# Patient Record
Sex: Female | Born: 1935 | Race: Black or African American | Hispanic: No | Smoking: Never smoker
Health system: Southern US, Community
[De-identification: ages and names within clinical notes are randomized; demographics above are authoritative.]

## PROBLEM LIST (undated history)

## (undated) DIAGNOSIS — R001 Bradycardia, unspecified: Secondary | ICD-10-CM

## (undated) DIAGNOSIS — I739 Peripheral vascular disease, unspecified: Secondary | ICD-10-CM

## (undated) DIAGNOSIS — E7800 Pure hypercholesterolemia, unspecified: Secondary | ICD-10-CM

## (undated) DIAGNOSIS — M1990 Unspecified osteoarthritis, unspecified site: Secondary | ICD-10-CM

## (undated) DIAGNOSIS — E039 Hypothyroidism, unspecified: Secondary | ICD-10-CM

## (undated) DIAGNOSIS — I639 Cerebral infarction, unspecified: Secondary | ICD-10-CM

## (undated) DIAGNOSIS — B019 Varicella without complication: Secondary | ICD-10-CM

## (undated) DIAGNOSIS — H409 Unspecified glaucoma: Secondary | ICD-10-CM

## (undated) DIAGNOSIS — I480 Paroxysmal atrial fibrillation: Secondary | ICD-10-CM

## (undated) DIAGNOSIS — I10 Essential (primary) hypertension: Secondary | ICD-10-CM

## (undated) HISTORY — DX: Bradycardia, unspecified: R00.1

## (undated) HISTORY — DX: Varicella without complication: B01.9

## (undated) HISTORY — PX: MAZE: SHX5063

---

## 1999-04-23 ENCOUNTER — Encounter (INDEPENDENT_AMBULATORY_CARE_PROVIDER_SITE_OTHER): Payer: Self-pay | Admitting: Specialist

## 1999-04-23 ENCOUNTER — Other Ambulatory Visit: Admission: RE | Admit: 1999-04-23 | Discharge: 1999-04-23 | Payer: Self-pay | Admitting: Gastroenterology

## 2001-05-04 ENCOUNTER — Other Ambulatory Visit: Admission: RE | Admit: 2001-05-04 | Discharge: 2001-05-04 | Payer: Self-pay | Admitting: Family Medicine

## 2001-05-11 ENCOUNTER — Encounter: Payer: Self-pay | Admitting: Family Medicine

## 2001-05-11 ENCOUNTER — Encounter: Admission: RE | Admit: 2001-05-11 | Discharge: 2001-05-11 | Payer: Self-pay | Admitting: Internal Medicine

## 2002-07-13 ENCOUNTER — Encounter: Payer: Self-pay | Admitting: Family Medicine

## 2002-07-13 ENCOUNTER — Encounter: Admission: RE | Admit: 2002-07-13 | Discharge: 2002-07-13 | Payer: Self-pay | Admitting: Family Medicine

## 2002-07-26 ENCOUNTER — Emergency Department (HOSPITAL_COMMUNITY): Admission: EM | Admit: 2002-07-26 | Discharge: 2002-07-26 | Payer: Self-pay | Admitting: Emergency Medicine

## 2002-10-21 ENCOUNTER — Emergency Department (HOSPITAL_COMMUNITY): Admission: EM | Admit: 2002-10-21 | Discharge: 2002-10-21 | Payer: Self-pay | Admitting: Emergency Medicine

## 2003-09-11 ENCOUNTER — Inpatient Hospital Stay (HOSPITAL_COMMUNITY): Admission: EM | Admit: 2003-09-11 | Discharge: 2003-09-16 | Payer: Self-pay | Admitting: Emergency Medicine

## 2003-09-18 ENCOUNTER — Encounter: Admission: RE | Admit: 2003-09-18 | Discharge: 2003-09-18 | Payer: Self-pay | Admitting: Family Medicine

## 2003-12-09 ENCOUNTER — Emergency Department (HOSPITAL_COMMUNITY): Admission: EM | Admit: 2003-12-09 | Discharge: 2003-12-09 | Payer: Self-pay | Admitting: Emergency Medicine

## 2004-03-17 ENCOUNTER — Ambulatory Visit: Payer: Self-pay | Admitting: Hematology & Oncology

## 2004-06-30 ENCOUNTER — Ambulatory Visit: Payer: Self-pay | Admitting: Hematology & Oncology

## 2005-09-23 ENCOUNTER — Emergency Department (HOSPITAL_COMMUNITY): Admission: EM | Admit: 2005-09-23 | Discharge: 2005-09-23 | Payer: Self-pay | Admitting: *Deleted

## 2005-10-23 ENCOUNTER — Encounter: Admission: RE | Admit: 2005-10-23 | Discharge: 2005-10-23 | Payer: Self-pay | Admitting: Family Medicine

## 2006-10-26 ENCOUNTER — Encounter: Admission: RE | Admit: 2006-10-26 | Discharge: 2006-10-26 | Payer: Self-pay | Admitting: Family Medicine

## 2007-10-27 ENCOUNTER — Encounter: Admission: RE | Admit: 2007-10-27 | Discharge: 2007-10-27 | Payer: Self-pay | Admitting: Family Medicine

## 2008-12-14 ENCOUNTER — Encounter: Admission: RE | Admit: 2008-12-14 | Discharge: 2008-12-14 | Payer: Self-pay | Admitting: Family Medicine

## 2009-08-19 ENCOUNTER — Encounter: Admission: RE | Admit: 2009-08-19 | Discharge: 2009-08-19 | Payer: Self-pay | Admitting: Family Medicine

## 2010-01-01 ENCOUNTER — Encounter: Admission: RE | Admit: 2010-01-01 | Discharge: 2010-01-01 | Payer: Self-pay | Admitting: Family Medicine

## 2010-03-16 ENCOUNTER — Encounter: Payer: Self-pay | Admitting: Gastroenterology

## 2010-03-16 ENCOUNTER — Encounter: Payer: Self-pay | Admitting: Family Medicine

## 2010-07-11 NOTE — Discharge Summary (Signed)
NAME:  [name], [name]                          ACCOUNT NO.:  [id number]   MEDICAL RECORD NO.:  [id number]                   PATIENT TYPE:  INP   LOCATION:  [place]                                 FACILITY:  [facility]   PHYSICIAN:  Cletus Gash T. Pedro Earls, MD            DATE OF BIRTH:  [birth year]   DATE OF ADMISSION:  09/11/2003  DATE OF DISCHARGE:  09/16/2003                                 DISCHARGE SUMMARY   ADDENDUM:  Please send copy of previous dictation to Horald Pollen at  [facility].                                                Cletus Gash T. Pedro Earls, MD    WTP/MEDQ  D:  09/16/2003  T:  09/17/2003  Job:  [id number]   cc:   Santiago Glad L. Darron Doom, M.D.  [address] [place] [address]  Fax: [place]  (dictatior request copy of previous  discharge to be sent to her.

## 2010-07-11 NOTE — Consult Note (Signed)
NAME:  [name], [name] NO.:  [id number]   MEDICAL RECORD NO.:  [id number]                   PATIENT TYPE:  EMS   LOCATION:  [place]                                 FACILITY:  [facility]   PHYSICIAN:  Alyson Locket. Love, M.D.                 DATE OF BIRTH:  08/13/1925   DATE OF CONSULTATION:  09/11/2003  DATE OF DISCHARGE:                                   CONSULTATION   HISTORY OF PRESENT ILLNESS:  This 77 year old right-handed black married  female was seen in the emergency room for evaluation of new-onset left  facial weakness and dysarthria.   The patient has a known prior history of 10-years of hypertension and a  three-year history of paroxysmal atrial fibrillation characterized by  tachycardia.  She is unable to take aspirin because of allergies developing  cough.  She has no known history of diabetes mellitus, cigarette use,  alcoholism, drug use, or coronary artery disease.   MEDICATIONS:  1. Avapro 150 mg daily.  2. Metoprolol 50 mg in the morning and 25 mg at night.   She states that she developed palpitations and was admitted to a hospital,  [facility] in [place], [place], in March of 2005.  At that time,  the question of Zocor was raised, but no anticoagulation therapy.  She has  been followed by Pat Patrick. Rayford Halsted, M.D., a cardiologist in the [place].  She was noted to have palpitations on Sunday, September 09, 2003, and last evening developed right temporal headache.  She awoke  this morning with left-sided mouth drooling and continued to have some  headache and went to her family practice doctor in [facility], who gave  her a dose of Altace and referred her to the emergency room.  The patient  has had no chest pain or palpitations.   PHYSICAL EXAMINATION:  GENERAL:  Well-developed black female.  VITAL SIGNS:  Blood pressure right and left arms 180/60, heart 56 with no  bruits.  She was alert and oriented x3.  There was  no denial syndrome.  NEUROLOGY:  Cranial nerve examination revealed a left inferior quadrinopsia,  both discs were seen and flat.  The extraocular movements were full and  corneals were present.  The facial sensation revealed some mild decreased  left face. There was a left 7th and a dysarthria.  Her tongue deviates  slightly to the left.  Gags were present.  Motor examination revealed left  hand and arm clumsiness, but good strength of the left hand and arm,  decreased pinprick in the left face, arm, and leg.  Deep tendon reflexes 2+  and plantar responses downgoing.  NIA stroke scale was 5.   IMPRESSION:  1. Right brain stroke, posterior parietal occipital.  434.11  2. History of atrial fibrillation.  427.31  Currently in sinus rhythm on     telemetry.  3. Allergy to aspirin.  995.2  4. Hypertension.  796.2   PLAN:  Obtain a CT scan, obtain labs, and place the patient on Plavix versus  heparin depending on results of studies.                                              Alyson Locket. Erling Cruz, M.D.   JML/MEDQ  D:  09/11/2003  T:  09/11/2003  Job:  [id number]

## 2010-07-11 NOTE — Discharge Summary (Signed)
NAME:  [name], [name]                          ACCOUNT NO.:  [id number]   MEDICAL RECORD NO.:  [id number]                   PATIENT TYPE:  INP   LOCATION:  64                                 FACILITY:  [facility]   PHYSICIAN:  Jamal Collin. Hensel, M.D.             DATE OF BIRTH:  [birth year]   DATE OF ADMISSION:  09/11/2003  DATE OF DISCHARGE:  09/16/2003                                 DISCHARGE SUMMARY   PRIMARY CARE PHYSICIAN:  Santiago Glad L. Darron Doom, M.D., at [facility].   CONSULTING PHYSICIAN:  Alyson Locket. Love, M.D., with [facility].   DISCHARGE DIAGNOSES:  1. Right cerebrovascular accident, posterior parietal occipital.  2. History of paroxysmal atrial fibrillation.  3. Hypertension.  4. History of gastrointestinal bleed.   ADMISSION LABORATORY DATA:  Sodium 143, potassium 4.0, chloride 109, bicarb  27, BUN of 11, creatinine 1.0, glucose 105.  White count of 5.1, hemoglobin  13.1, hematocrit 39.9, platelet count of 218.  CK 59, CK-MB 0.9.  PTT of 28,  PT of 12.2, INR 0.9.  The patient is heme-negative.  Hemoglobin A1C of 6.1.  Initial troponin of 0.05.  Also got a lipid study, cholesterol 215,  triglycerides 52, HDL of 68, LDL of 137, VLDL of 10.  TSH of 3.770.  Homocysteine level of 11.74.   DISCHARGE LABORATORY DATA:  White count of 3.8, hemoglobin of 12.0,  hematocrit of 35.7, platelet count of 142.  PT of 18.9, INR of 1.9.  The  patient is therapeutic on heparin.   PROCEDURES:  The patient had a CT of the head on July 19 without contrast  that showed probable subacute left frontal infarct, mild diffuse cerebral  and cerebellar atrophy, no intracranial hemorrhage, mild chronic sphenoid  and right posterior ethmoid sinusitis.  The patient had MRI/MRA of the brain  on July 19 that showed acute infarcts involving the right middle cerebral  artery territory at multiple sites and also the right parieto-occipital  territory near the watershed.  A subacute  infarct on the left posterior  frontal lobe.  An MRA showed decreased arterial flow in some of the right  middle cerebral artery branches as well as probably the right distal PCA  branch.   HOSPITAL COURSE:  Please see the dictated H&P on the chart but, in short,  the patient is a 77 year old black female with a history of atrial  fibrillation with RVR, paroxysmal, not on anticoagulation, who presented  with a right-sided headache that developed on July 18, which she describes  as a poking, 8/10.  She awoke the morning of admission drooling  uncontrollably from the left side of the mouth.  Her speech was slurred.  She went to her primary care physician's office and was sent to the  emergency department, where based on the initial CT as dictated in the  procedures section, she was admitted with a CVA, diagnosis of subacute  left  frontal CVA with a facial droop secondary to that.  She was admitted for an  MRI and MRA of the brain to further evaluate it.  Neurology was consulted.   Problem 1.  CEREBROVASCULAR ACCIDENT OF THE BRAIN:  The patient was started  on heparin and was maintained therapeutic on the heparin until she became  therapeutic on Coumadin, with a goal INR of between 2 and 2.5.  The  patient's INR on the day of discharge was 1.9 and stable on day 3 of  Coumadin 5 mg p.o. daily.  The patient was discharged home with Coumadin 5  mg p.o. daily for treatment of anticoagulation for the stroke  prophylaxis/propagation.  She will need warfarin long-term. Also obtained a  fasting lipid panel as documented in the results section as well as a 2 D  echo bubble study to rule out PFO.  The TEE was later discontinued, as it  will not change the treatment plan.  The patient was treated, as I said,  earlier with heparin until she became therapeutic on Coumadin and was  discharged on a therapeutic dose of Coumadin.  Speech was also evaluated  given the patient's drooling and to evaluate for  dysphagia and possible  aspiration.  There was no evidence of dysphagia or aspiration.  The patient  was placed on a regular diet and was discharged home on a regular diet.   Problem 2.  ATRIAL FIBRILLATION:  The patient is on metoprolol here in the  hospital 50 mg p.o. daily and is in normal sinus rhythm with no evidence of  atrial fibrillation.  The patient has a history of paroxysmal atrial  fibrillation, for which she will take the metoprolol as previously directed.  She is also now on Coumadin secondary to the stroke.  This will also assist  with stroke prophylaxis given her history of paroxysmal atrial fibrillation.   Problem 3.  HYPERTENSION:  The patient is on metoprolol as well as Avapro.  Her blood pressures were stable during the hospital admission and on the day  of discharge, the patient's blood pressure was 158/70.  The patient will  likely need titration of her blood pressure medications as an outpatient.   Problem 4.  HYPERLIPIDEMIA:  The fasting lipid panel obtained on the morning  of admission was significant for an LDL of 137 and an HDL of 68.  The  patient was placed on Zocor 40 mg p.o. daily and was discharged home on  Zocor 40 mg p.o. daily given the fact that she will likely need an LDL of  less than 100.  She will need follow-up with her primary care physician to  monitor her LFTs as well as tolerating the new medicines.   Problem 5.  HEADACHES:  The patient had right-sided retro-orbital headaches  with pain radiating to her teeth and rhinorrhea on two to three mornings  during the admission.  She was diagnosed with cluster headaches and was  treated with Percocet p.r.n. for the headaches as well as 100% oxygen by  face mask.  She said the oxygen helped with the headaches and the Percocet  managed the pain as well.   DISCHARGE MEDICATIONS:  1. Avapro 150 mg one p.o. daily.  2. Metoprolol 50 mg one p.o. daily.  3. Zocor 40 mg one p.o. daily. 4. Coumadin 5 mg  one p.o. daily.  5. Percocet 06/3233 mg one tablet q.6h. for headache pain.   FOLLOW-UP INSTRUCTIONS:  1. She was  given the number for [facility], [phone], and     was told to call either Monday or Tuesday to schedule a lab draw to     monitor her PT and INR.  She was also told to arrange for a follow-up     appointment with Dr. Darron Doom in one to two weeks.  We would appreciate if     Dr. Darron Doom would monitor her PT and INR and titrate her dose of Coumadin     accordingly to maintain a therapeutic level between 2-3 given her history     of CVA.  2. We would appreciate Dr. Darron Doom titrating her Coumadin to a therapeutic     level.  We would also appreciate her monitoring her success on Zocor 40     mg p.o. daily for signs of liver toxicity as well as myositis.  We also     appreciate her titrating her blood pressure medications as Dr. Darron Doom     deems appropriate to manage her blood pressure more accordingly.  We also     appreciate if Dr. Darron Doom would monitor for success of Percocet     controlling     her headaches and also for any need for any other medications that she     may see fit.  As this is a Sunday, we were unable to call and schedule     the blood draws or the appointment with Dr. Darron Doom, but the patient was     given the phone number and she understood at the time of discharge that     she needed to schedule that appointment.      Cletus Gash T. Pedro Earls, MD                  Jamal Collin Andria Frames, M.D.    Dur[place] Nora  D:  09/16/2003  T:  09/17/2003  Job:  [id number]   cc:   Santiago Glad L. Darron Doom, M.D.  [address] [place] [address]  Fax: [place] Love, M.D.  [address] [place] [address]  Fax: [phone]

## 2010-11-25 ENCOUNTER — Other Ambulatory Visit: Payer: Self-pay | Admitting: Family Medicine

## 2010-11-25 DIAGNOSIS — Z1231 Encounter for screening mammogram for malignant neoplasm of breast: Secondary | ICD-10-CM

## 2011-01-06 ENCOUNTER — Ambulatory Visit
Admission: RE | Admit: 2011-01-06 | Discharge: 2011-01-06 | Disposition: A | Discharge status: Home / Self Care | Payer: Medicare Other | Source: Ambulatory Visit | Attending: Family Medicine | Admitting: Family Medicine

## 2011-01-06 DIAGNOSIS — Z1231 Encounter for screening mammogram for malignant neoplasm of breast: Secondary | ICD-10-CM

## 2011-03-03 DIAGNOSIS — R1032 Left lower quadrant pain: Secondary | ICD-10-CM | POA: Diagnosis not present

## 2011-03-03 DIAGNOSIS — N83209 Unspecified ovarian cyst, unspecified side: Secondary | ICD-10-CM | POA: Diagnosis not present

## 2011-03-24 DIAGNOSIS — H538 Other visual disturbances: Secondary | ICD-10-CM | POA: Diagnosis not present

## 2011-03-24 DIAGNOSIS — I10 Essential (primary) hypertension: Secondary | ICD-10-CM | POA: Diagnosis not present

## 2011-03-24 DIAGNOSIS — R51 Headache: Secondary | ICD-10-CM | POA: Diagnosis not present

## 2011-03-31 DIAGNOSIS — D3130 Benign neoplasm of unspecified choroid: Secondary | ICD-10-CM | POA: Diagnosis not present

## 2011-03-31 DIAGNOSIS — H40019 Open angle with borderline findings, low risk, unspecified eye: Secondary | ICD-10-CM | POA: Diagnosis not present

## 2011-04-01 ENCOUNTER — Other Ambulatory Visit (HOSPITAL_COMMUNITY): Payer: Medicare Other

## 2011-04-01 ENCOUNTER — Inpatient Hospital Stay (HOSPITAL_COMMUNITY)
Admission: EM | Admit: 2011-04-01 | Discharge: 2011-04-04 | DRG: 066 | Disposition: A | Discharge status: Home Health | Payer: Medicare Other | Source: Ambulatory Visit | Attending: Family Medicine | Admitting: Family Medicine

## 2011-04-01 ENCOUNTER — Encounter (HOSPITAL_COMMUNITY): Payer: Self-pay | Admitting: Emergency Medicine

## 2011-04-01 ENCOUNTER — Observation Stay (HOSPITAL_COMMUNITY): Admit: 2011-04-01 | Discharge: 2011-04-01 | Disposition: A | Discharge status: Home / Self Care | Payer: Medicare Other

## 2011-04-01 ENCOUNTER — Other Ambulatory Visit: Payer: Self-pay

## 2011-04-01 ENCOUNTER — Emergency Department (HOSPITAL_COMMUNITY): Payer: Medicare Other

## 2011-04-01 ENCOUNTER — Observation Stay (HOSPITAL_COMMUNITY): Payer: Medicare Other

## 2011-04-01 DIAGNOSIS — I10 Essential (primary) hypertension: Secondary | ICD-10-CM

## 2011-04-01 DIAGNOSIS — H539 Unspecified visual disturbance: Secondary | ICD-10-CM | POA: Diagnosis not present

## 2011-04-01 DIAGNOSIS — R51 Headache: Secondary | ICD-10-CM

## 2011-04-01 DIAGNOSIS — I498 Other specified cardiac arrhythmias: Secondary | ICD-10-CM | POA: Diagnosis present

## 2011-04-01 DIAGNOSIS — E039 Hypothyroidism, unspecified: Secondary | ICD-10-CM | POA: Diagnosis present

## 2011-04-01 DIAGNOSIS — G43909 Migraine, unspecified, not intractable, without status migrainosus: Secondary | ICD-10-CM | POA: Diagnosis present

## 2011-04-01 DIAGNOSIS — I495 Sick sinus syndrome: Secondary | ICD-10-CM | POA: Diagnosis not present

## 2011-04-01 DIAGNOSIS — I639 Cerebral infarction, unspecified: Secondary | ICD-10-CM

## 2011-04-01 DIAGNOSIS — R6889 Other general symptoms and signs: Secondary | ICD-10-CM | POA: Diagnosis not present

## 2011-04-01 DIAGNOSIS — R001 Bradycardia, unspecified: Secondary | ICD-10-CM

## 2011-04-01 DIAGNOSIS — I6350 Cerebral infarction due to unspecified occlusion or stenosis of unspecified cerebral artery: Principal | ICD-10-CM | POA: Diagnosis present

## 2011-04-01 DIAGNOSIS — G43009 Migraine without aura, not intractable, without status migrainosus: Secondary | ICD-10-CM | POA: Diagnosis not present

## 2011-04-01 DIAGNOSIS — I4891 Unspecified atrial fibrillation: Secondary | ICD-10-CM | POA: Diagnosis not present

## 2011-04-01 DIAGNOSIS — Z8673 Personal history of transient ischemic attack (TIA), and cerebral infarction without residual deficits: Secondary | ICD-10-CM | POA: Diagnosis present

## 2011-04-01 DIAGNOSIS — I480 Paroxysmal atrial fibrillation: Secondary | ICD-10-CM | POA: Insufficient documentation

## 2011-04-01 DIAGNOSIS — Z79899 Other long term (current) drug therapy: Secondary | ICD-10-CM

## 2011-04-01 DIAGNOSIS — H269 Unspecified cataract: Secondary | ICD-10-CM | POA: Diagnosis present

## 2011-04-01 DIAGNOSIS — H409 Unspecified glaucoma: Secondary | ICD-10-CM | POA: Diagnosis present

## 2011-04-01 HISTORY — DX: Paroxysmal atrial fibrillation: I48.0

## 2011-04-01 HISTORY — DX: Cerebral infarction, unspecified: I63.9

## 2011-04-01 HISTORY — DX: Essential (primary) hypertension: I10

## 2011-04-01 HISTORY — DX: Hypothyroidism, unspecified: E03.9

## 2011-04-01 LAB — DIFFERENTIAL
Basophils Relative: 0 % (ref 0–1)
Eosinophils Relative: 1 % (ref 0–5)
Lymphs Abs: 1.5 10*3/uL (ref 0.7–4.0)
Monocytes Absolute: 0.4 10*3/uL (ref 0.1–1.0)
Neutro Abs: 1.9 10*3/uL (ref 1.7–7.7)
Neutrophils Relative %: 50 % (ref 43–77)

## 2011-04-01 LAB — POCT I-STAT, CHEM 8
BUN: 18 mg/dL (ref 6–23)
Hemoglobin: 13.3 g/dL (ref 12.0–15.0)
Potassium: 4.5 mEq/L (ref 3.5–5.1)
Sodium: 143 mEq/L (ref 135–145)
TCO2: 27 mmol/L (ref 0–100)

## 2011-04-01 LAB — CBC
Hemoglobin: 12.9 g/dL (ref 12.0–15.0)
MCHC: 33.1 g/dL (ref 30.0–36.0)
MCV: 81.4 fL (ref 78.0–100.0)
Platelets: 172 10*3/uL (ref 150–400)

## 2011-04-01 LAB — POCT I-STAT TROPONIN I: Troponin i, poc: 0.01 ng/mL (ref 0.00–0.08)

## 2011-04-01 MED ORDER — ACETAMINOPHEN 650 MG RE SUPP: 650.0000 mg | Freq: Four times a day (QID) | RECTAL | Status: DC | PRN

## 2011-04-01 MED ORDER — SODIUM CHLORIDE 0.9 % IV SOLN
INTRAVENOUS | Status: DC
  Administered 2011-04-01 (×2): via INTRAVENOUS

## 2011-04-01 MED ORDER — HYDRALAZINE HCL 20 MG/ML IJ SOLN
10.0000 mg | Freq: Once | INTRAMUSCULAR | Status: AC
  Administered 2011-04-01: 10 mg via INTRAVENOUS
  Filled 2011-04-01: qty 0.5

## 2011-04-01 MED ORDER — HYDROCHLOROTHIAZIDE 25 MG PO TABS
25.0000 mg | ORAL_TABLET | Freq: Every day | ORAL | Status: DC
  Administered 2011-04-01: 25 mg via ORAL
  Filled 2011-04-01 (×4): qty 1

## 2011-04-01 MED ORDER — SODIUM CHLORIDE 0.9 % IJ SOLN: 3.0000 mL | INTRAMUSCULAR | Status: DC | PRN

## 2011-04-01 MED ORDER — ONDANSETRON HCL 4 MG/2ML IJ SOLN: 4.0000 mg | Freq: Four times a day (QID) | INTRAMUSCULAR | Status: DC | PRN

## 2011-04-01 MED ORDER — HYDROCODONE-ACETAMINOPHEN 5-325 MG PO TABS: 1.0000 | ORAL_TABLET | ORAL | Status: DC | PRN

## 2011-04-01 MED ORDER — FENTANYL CITRATE 0.05 MG/ML IJ SOLN
50.0000 ug | Freq: Once | INTRAMUSCULAR | Status: AC
  Administered 2011-04-01: 50 ug via INTRAVENOUS
  Filled 2011-04-01: qty 2

## 2011-04-01 MED ORDER — ONDANSETRON HCL 4 MG/2ML IJ SOLN
4.0000 mg | Freq: Once | INTRAMUSCULAR | Status: AC
  Administered 2011-04-01: 4 mg via INTRAVENOUS
  Filled 2011-04-01: qty 2

## 2011-04-01 MED ORDER — SODIUM CHLORIDE 0.9 % IJ SOLN: 3.0000 mL | Freq: Two times a day (BID) | INTRAMUSCULAR | Status: DC

## 2011-04-01 MED ORDER — BISACODYL 5 MG PO TBEC
5.0000 mg | DELAYED_RELEASE_TABLET | Freq: Every day | ORAL | Status: DC | PRN
  Filled 2011-04-01: qty 1

## 2011-04-01 MED ORDER — SODIUM CHLORIDE 0.9 % IV SOLN: INTRAVENOUS | Status: DC

## 2011-04-01 MED ORDER — ALUM & MAG HYDROXIDE-SIMETH 200-200-20 MG/5ML PO SUSP: 30.0000 mL | Freq: Four times a day (QID) | ORAL | Status: DC | PRN

## 2011-04-01 MED ORDER — ACETAMINOPHEN 325 MG PO TABS
650.0000 mg | ORAL_TABLET | Freq: Four times a day (QID) | ORAL | Status: DC | PRN
  Administered 2011-04-01: 650 mg via ORAL
  Filled 2011-04-01: qty 2

## 2011-04-01 MED ORDER — SODIUM CHLORIDE 0.9 % IV SOLN: 250.0000 mL | INTRAVENOUS | Status: DC | PRN

## 2011-04-01 MED ORDER — THYROID 30 MG PO TABS
15.0000 mg | ORAL_TABLET | Freq: Every day | ORAL | Status: DC
  Administered 2011-04-01 – 2011-04-04 (×4): 15 mg via ORAL
  Filled 2011-04-01 (×5): qty 1

## 2011-04-01 MED ORDER — SODIUM CHLORIDE 0.9 % IJ SOLN
3.0000 mL | Freq: Two times a day (BID) | INTRAMUSCULAR | Status: DC
  Administered 2011-04-01 – 2011-04-04 (×6): 3 mL via INTRAVENOUS

## 2011-04-01 MED ORDER — SENNOSIDES-DOCUSATE SODIUM 8.6-50 MG PO TABS
1.0000 | ORAL_TABLET | Freq: Every evening | ORAL | Status: DC | PRN
  Filled 2011-04-01: qty 1

## 2011-04-01 MED ORDER — ONDANSETRON HCL 4 MG PO TABS: 4.0000 mg | ORAL_TABLET | Freq: Four times a day (QID) | ORAL | Status: DC | PRN

## 2011-04-01 MED ORDER — OLMESARTAN MEDOXOMIL 40 MG PO TABS
40.0000 mg | ORAL_TABLET | Freq: Every day | ORAL | Status: DC
  Administered 2011-04-01 – 2011-04-04 (×4): 40 mg via ORAL
  Filled 2011-04-01 (×6): qty 1

## 2011-04-01 NOTE — ED Notes (Signed)
EKG obtained and showed to Dr. Alvino Chapel.

## 2011-04-01 NOTE — ED Notes (Signed)
MD at bedside. 

## 2011-04-01 NOTE — H&P (Signed)
History and Physical  CADANCE SCHUMAN [id number] DOB: [birth year] DOA: 04/01/2011  Referring physician: PCP: Odette Fraction, MD, MD   Chief Complaint: High blood pressure  HPI:  78 year old woman presents to the emergency department with complaint of headache for one and a half weeks, bradycardia, hypertension. Recently started on bysystolic by her primary care physician for hypertension. Since that time she has noticed a bradycardia at home. Blood pressure has remained high at home. Last night her blood pressure was over [id number] systolic and so she came to the emergency department for further evaluation.  She has a history of migraines and has had several migraine headaches lately. When she was last seen in the office by her primary care physician she had some testing done to exclude temporal arteritis as she had had some temporal pain. No visual changes suggestive of temporal arteritis. By report her blood work was unremarkable. She was seen yesterday by her eye doctor who told her that she had bilateral cataracts and possible glaucoma.  In the emergency department she was noted to have narrow complex bradycardia without evidence of high grade AV block. Cardiology consultation was requested and the patient was referred for medical admission. Of note CT of the head done for headache could not rule out acute stroke. However the patient denies any focal neurologic deficits. She does report a history of 3 strokes in the past.  Review of Systems:  Negative for fever, sore throat, rash, new muscle aches, focal neurologic deficits, "curtain over vision", chest pain, shortness of breath, abdominal pain, nausea, vomiting, diarrhea, dysuria, bleeding.   Past Medical History  Diagnosis Date  . Hypertension   . Stroke   . Palpitations   . PAF (paroxysmal atrial fibrillation)   . Hypothyroidism   . Migraine    Past Surgical History  Procedure Date  . A flutter ablation     2006   Social History:   reports that she has never smoked. She does not have any smokeless tobacco history on file. She reports that she does not drink alcohol or use illicit drugs.  Allergies  Allergen Reactions  . Aspirin Cough    Family History  Problem Relation Age of Onset  . Hypertension Mother     Prior to Admission medications   Medication Sig Start Date End Date Taking? Authorizing Provider  irbesartan (AVAPRO) 150 MG tablet Take 150 mg by mouth daily.   Yes Historical Provider, MD  nebivolol (BYSTOLIC) 10 MG tablet Take 10 mg by mouth daily.   Yes Historical Provider, MD  thyroid (ARMOUR) 15 MG tablet Take 15 mg by mouth daily.   Yes Historical Provider, MD   Physical Exam: Filed Vitals:   04/01/11 0630 04/01/11 0645 04/01/11 0700 04/01/11 0758  BP: 151/60 135/48 143/51 156/54  Pulse: 56 56 55 56  Temp:    98.2 F (36.8 C)  TempSrc:    Oral  Resp: 19 17 15 17   SpO2: 93% 93% 96% 97%     General:  Appears calm and comfortable. Appears younger than stated age. No temporal pain bilaterally to palpation.  Eyes:  Pupils equal, round, reactive to light. Normal lids, irises, conjunctiva.  ENT:  Grossly normal hearing. Normal lips and tongue.  Neck:  No lymphadenopathy or masses. No thyromegaly.  Cardiovascular:  Regular rate and rhythm. No murmur, rub, gallop. No lower extremity edema.  Telemetry: During examination heart rate in the 60s. Review of telemetry is notable for marked sinus bradycardia. No evidence of high-grade  AV block.  Respiratory:  Clear to auscultation bilaterally. No wheezes, rales, rhonchi. Normal respiratory effort.  Abdomen:  Soft, nontender, nondistended.  Skin:  Grossly unremarkable.  Musculoskeletal:  Tone and strength upper and lower extremities appears grossly normal. No focal deficits noted.  Psychiatric:  Grossly normal mood and affect. Speech fluent and appropriate.  Neurologic:  Cranial nerves 2-12 intact.  Labs on Admission:  Basic Metabolic  Panel:  Lab [id number]  NA 143  K 4.5  CL 108  CO2 --  GLUCOSE 105*  BUN 18  CREATININE 1.00  CALCIUM --  MG --  PHOS --   CBC:  Lab 04/01/11 0439 04/01/11 0429  WBC -- 3.9*  NEUTROABS -- 1.9  HGB 13.3 12.9  HCT 39.0 39.0  MCV -- 81.4  PLT -- 172   Radiological Exams on Admission: Ct Head Wo Contrast  04/01/2011  *RADIOLOGY REPORT*  Clinical Data: Right-sided headache and visual disturbances  CT HEAD WITHOUT CONTRAST  Technique:  Contiguous axial images were obtained from the base of the skull through the vertex without contrast.  Comparison: 09/11/2003 MRI  Findings: Remote infarct involving the inferior left frontal lobe. Within the posterior right parietal lobe, there is an area of hypoattenuation without mass effect, also favored to be remote infarction.  Age indeterminate right thalamic lacunar infarction. Prominence of the sulci, cisterns, and ventricles, in keeping with volume loss. There are subcortical and periventricular white matter hypodensities, a nonspecific finding most often seen with chronic microangiopathic changes.  There is no evidence for acute hemorrhage, overt hydrocephalus, mass lesion, or abnormal extra-axial fluid collection.  No definite CT evidence for acute cortical based (large artery) infarction. The visualized paranasal sinuses and mastoid air cells are predominately clear.  IMPRESSION: Hypoattenuating areas within the inferior left frontal lobe and posterior right parietal lobe are favored to reflect areas of remote infarction.  White matter hypodensities are a nonspecific finding most in keeping with chronic microangiopathic change.  Age indeterminate right thalamic lacunar infarction. If clinical concern for acute ischemia persists, MRI recommended.  Original Report Authenticated By: Suanne Marker, M.D.    EKG: Independently reviewed.  Sinus bradycardia with rate of 40. Incomplete right bundle branch block. Nonspecific ST changes. No acute  changes seen.  Assessment/Plan 1. Marked sinus bradycardia: Presumably secondary to recent start a beta blocker therapy. Continue beta blocker. Appreciate pending cardiology consultation. 2. Migraine headaches: She is a history of migraine headaches which are associated with visual disturbances typically. She did have a headache last night and has had headaches over the last 1 weeks. No further evaluation indicated at this time. 3. Abnormal CT of the head: Performed for headaches. Age indeterminate right thalamic lacunar infarction. Patient reports history of 3 strokes. Besides her headache and visual changes associated with migraines (as well as cataracts and possible glaucoma by report) no focal neurologic symptoms. No neurologic deficits on examination. I doubt acute stroke or acute ischemia. Patient is allergic to aspirin. Will check an MRI of the brain to rule out acute stroke but as this is doubted at this point will not proceed with further evaluation unless positive. 4. Hypertension: Continue Avapro. Start diuretic. 5. History of paroxysmal atrial fibrillation: Status post ablation. 6. Hypothyroidism: Continue replacement therapy.  Code Status:  Full code Family Communication:  Discussed with husband at bedside Disposition Plan:  Home when improved.  Murray Hodgkins, MD  [facility] Pager [phone] 04/01/2011, 10:17 AM

## 2011-04-01 NOTE — ED Notes (Signed)
Pt states she has had headache for about one and a half weeks. Pt denies nausea. Pt states she has had visual disturbances for the past week and a half also.

## 2011-04-01 NOTE — ED Provider Notes (Signed)
History     CSN: [id number]  Arrival date & time 04/01/11  0231   First MD Initiated Contact with Patient 04/01/11 0403      Chief Complaint  Patient presents with  . Headache  . Hypertension    (Consider location/radiation/quality/duration/timing/severity/associated sxs/prior treatment) Patient is a 78 y.o. female presenting with headaches and hypertension. The history is provided by the patient.  Headache  Pertinent negatives include no shortness of breath, no nausea and no vomiting.  Hypertension Associated symptoms include headaches. Pertinent negatives include no chest pain, no abdominal pain and no shortness of breath.   patient has had headaches recently. She's also high blood pressure for the last week or 2. She was started on Bystolic by her primary care Dr. She states she had tests look for temporal arteritis for right-sided headache. She states it was negative. No numbness or weakness. No chest pain. She states she saw an ophthalmologist. No lightheadedness or dizziness. She's had some visual changes. No near syncope or dizziness. Her blood pressure was elevated when he was checked at home today. Her heart rate normally runs in the 50s. He is in the 30s and 40s here.   Past Medical History  Diagnosis Date  . Hypertension   . Stroke   . Palpitations     History reviewed. No pertinent past surgical history.  No family history on file.  History  Substance Use Topics  . Smoking status: Not on file  . Smokeless tobacco: Not on file  . Alcohol Use:     OB History    Grav Para Term Preterm Abortions TAB SAB Ect Mult Living                  Review of Systems  Constitutional: Negative for activity change and appetite change.  HENT: Negative for neck stiffness.   Eyes: Positive for visual disturbance. Negative for pain.  Respiratory: Negative for chest tightness and shortness of breath.   Cardiovascular: Negative for chest pain and leg swelling.  Gastrointestinal:  Negative for nausea, vomiting, abdominal pain and diarrhea.  Genitourinary: Negative for flank pain.  Musculoskeletal: Negative for back pain.  Skin: Negative for rash.  Neurological: Positive for headaches. Negative for weakness, light-headedness and numbness.  Psychiatric/Behavioral: Negative for behavioral problems.    Allergies  Aspirin  Home Medications   Current Outpatient Rx  Name Route Sig Dispense Refill  . IRBESARTAN 150 MG PO TABS Oral Take 150 mg by mouth daily.    . NEBIVOLOL HCL 10 MG PO TABS Oral Take 10 mg by mouth daily.    . THYROID 15 MG PO TABS Oral Take 15 mg by mouth daily.      BP 163/53  Pulse 61  Temp(Src) 98 F (36.7 C) (Oral)  Resp 16  SpO2 100%  Physical Exam  Nursing note and vitals reviewed. Constitutional: She is oriented to person, place, and time. She appears well-developed and well-nourished.  HENT:  Head: Normocephalic and atraumatic.  Eyes: EOM are normal. Pupils are equal, round, and reactive to light.  Neck: Normal range of motion. Neck supple.  Cardiovascular: Normal rate, regular rhythm and normal heart sounds.   No murmur heard. Pulmonary/Chest: Effort normal and breath sounds normal. No respiratory distress. She has no wheezes. She has no rales.  Abdominal: Soft. Bowel sounds are normal. She exhibits no distension. There is no tenderness. There is no rebound and no guarding.  Musculoskeletal: Normal range of motion.  Neurological: She is alert and oriented  to person, place, and time. No cranial nerve deficit.  Skin: Skin is warm and dry.  Psychiatric: She has a normal mood and affect. Her speech is normal.    ED Course  Procedures (including critical care time)  Labs Reviewed  CBC - Abnormal; Notable for the following:    WBC 3.9 (*)    All other components within normal limits  POCT I-STAT, CHEM 8 - Abnormal; Notable for the following:    Glucose, Bld 105 (*)    All other components within normal limits  DIFFERENTIAL    POCT I-STAT TROPONIN I   Ct Head Wo Contrast  04/01/2011  *RADIOLOGY REPORT*  Clinical Data: Right-sided headache and visual disturbances  CT HEAD WITHOUT CONTRAST  Technique:  Contiguous axial images were obtained from the base of the skull through the vertex without contrast.  Comparison: 09/11/2003 MRI  Findings: Remote infarct involving the inferior left frontal lobe. Within the posterior right parietal lobe, there is an area of hypoattenuation without mass effect, also favored to be remote infarction.  Age indeterminate right thalamic lacunar infarction. Prominence of the sulci, cisterns, and ventricles, in keeping with volume loss. There are subcortical and periventricular white matter hypodensities, a nonspecific finding most often seen with chronic microangiopathic changes.  There is no evidence for acute hemorrhage, overt hydrocephalus, mass lesion, or abnormal extra-axial fluid collection.  No definite CT evidence for acute cortical based (large artery) infarction. The visualized paranasal sinuses and mastoid air cells are predominately clear.  IMPRESSION: Hypoattenuating areas within the inferior left frontal lobe and posterior right parietal lobe are favored to reflect areas of remote infarction.  White matter hypodensities are a nonspecific finding most in keeping with chronic microangiopathic change.  Age indeterminate right thalamic lacunar infarction. If clinical concern for acute ischemia persists, MRI recommended.  Original Report Authenticated By: Suanne Marker, M.D.     No diagnosis found.   Date: 04/01/2011  Rate: 40  Rhythm: sinus bradycardia  QRS Axis: normal  Intervals: normal  ST/T Wave abnormalities: normal  Conduction Disutrbances:incomplete RBBB  Narrative Interpretation: more bradycardic  Old EKG Reviewed: changes noted    MDM  The patient presented with headache. She's also had hypertension home. She was found to be bradycardic here. She appears to be in a  sinus bradycardia. She will have episodes where PR with interval decreases and she appears to higher junctional escape rhythm. This is not necessarily when she is at the severe bradycardia. Initial hypertension was treated with hydralazine. The headache and change from right-sided to more diffuse after that. CT showed likely chronic changes. She'll be admitted to medicine. I discussed with cardiology, who will consult the patient.       Jasper Riling. Alvino Chapel, MD 04/01/11 [phone]

## 2011-04-01 NOTE — ED Notes (Signed)
Pt c/o worsening headache. MD aware. Further med orders received.

## 2011-04-01 NOTE — Consult Note (Signed)
CARDIOLOGY CONSULT NOTE   Patient ID: [name] MRN: [id number] DOB/AGE: 1925/05/30 78 y.o.  Admit date: 04/01/2011  Primary Physician   Dr Dennard Schaumann at [facility] Primary Cardiologist   Was Ola Spurr at [facility] Reason for Consultation   bradycardia  [id number] C Mauthe is a 78 y.o. female with no history of CAD.  She saw Dr. Dennard Schaumann, her family physician about 10 days ago and was started on Bystolic for better blood pressure control. She has been compliant with this medication. She checks her blood pressure regularly and noticed that her blood pressure was elevated and her heart rate was as low as 40. She communicated this with her doctor's office and was told that she should discuss this with Dr. Dennard Schaumann when she sees him on 04/03/2011. The patient was planning to do this but developed worsening headache and her blood pressure climbed with the headache. Last p.m., she noticed a systolic blood pressure of 200 and she had a severe headache with this. She was concerned about having another stroke and so came to the hospital.  in the emergency room she was noted to have heart rate sustained in the 40s and occasionally dropping into the 30s. Cardiology was asked to evaluate her.  Ms. Handlin has not had any chest pain. She has no awareness of a slow heart rate. She knows that her heart rate was slow because she saw her blood pressure machine but was otherwise unaware. Specifically, she has had no presyncope, dizziness and has been in no danger of falling. Other than the headache which is a temporal headache on the right, she has no new issues or complaints.   Past Medical History  Diagnosis Date  . Hypertension   . Stroke   . Palpitations   . PAF (paroxysmal atrial fibrillation)    Surgical history -  A fib Ablation 2006  Allergies  Allergen Reactions   Aceon  cough   . Aspirin Cough    I have reviewed the patient's current medications. Prior to Admission:  1. irbesartan (AVAPRO)  150 MG tablet, Take 150 mg by mouth daily., Disp: , Rfl: ; 2.  nebivolol (BYSTOLIC) 10 MG tablet, Take 10 mg by mouth daily.10 days 3. thyroid (ARMOUR) 15 MG tablet, Take 15 mg by mouth daily., Disp: , Rfl:  Scheduled:   . sodium chloride   Intravenous STAT  . fentaNYL  50 mcg Intravenous Once  . hydrALAZINE  10 mg Intravenous Once  . ondansetron  4 mg Intravenous Once    History   Social History  . Marital Status: Married    Spouse Name: N/A    Number of Children: N/A  . Years of Education: N/A   Occupational History  .  retired now, but worked in the [employer]  . Smoking status: Never  . Smokeless tobacco: Never  . Alcohol Use: no  . Drug Use:   . Sexually Active:    Social History Narrative  . Lives with husband of 68 years.     Family history on file. Mother died 56, HTN, Father died at 49, GIB, neither one nor siblings with cardiac issues  ROS:  she has rare palpitations since the ablation. She will occasionally feel her heart speed up but it is brief, self-limiting and does not cause any associated symptoms. She has been having frequent headaches. She saw her eye doctor as recommended and the eye doctor told her that she had cataracts but  said that her blood pressure would need to be improved before she could have cataract surgery. When her blood pressure is high and the headache is severe, she has visual disturbances and as well. She feels that she doesn't see all of a sentence on a page or when she looks at something she can see only half of this. It is unclear if she is having visual field loss or problems interpreting the images. She has occasional musculoskeletal aches and pains. She has had no recent illnesses, fevers or chills. She has rare reflux symptoms and never gets melena. She feels that her secretions including saliva and nasal drainage significantly improved increased after her stroke in 2005. She still has problems with drooling at  times but no problems swallowing. Full 14 point review of systems complete and found to be negative unless listed  above  Physical Exam: Blood pressure 156/54, pulse 56, temperature 98.2 F (36.8 C), temperature source Oral, resp. rate 17, SpO2 97.00%.   General: Well developed, well nourished, elderly female in no acute distress Head: Eyes PERRLA, No xanthomas.   Normocephalic and atraumatic, oropharynx without edema or exudate. Dentition pretty good. Lungs: Clear bilaterally to auscultation  Heart: HRRR S1 S2, no rub/gallop, no murmur. pulses are 2+ & equal all 4 extrem.   Neck: No carotid bruit. No lymphadenopathy.  JVD not elevated. Abdomen: Bowel sounds present, abdomen soft and non-tender without masses or hernias noted. Msk:  No spine or cva tenderness. She is a little weak but there does not seem to be any unilateral weakness, no joint deformities or effusions. Extremities: No clubbing or cyanosis.  No edema.  Neuro: Alert and oriented X 3. No focal deficits noted. Psych:  Good affect, responds appropriately Skin: No rashes or lesions noted.  Labs:   Lab Results  Component Value Date   WBC 3.9* 04/01/2011   HGB 13.3 04/01/2011   HCT 39.0 04/01/2011   MCV 81.4 04/01/2011   PLT 172 04/01/2011   No results found for this basename: INR in the last 72 hours  Lab 04/01/11 0439  NA 143  K 4.5  CL 108  CO2 --  BUN 18  CREATININE 1.00  CALCIUM --  PROT --  BILITOT --  ALKPHOS --  ALT --  AST --  GLUCOSE 105*   Radiology:  Ct Head Wo Contrast 04/01/2011  *RADIOLOGY REPORT*  Clinical Data: Right-sided headache and visual disturbances  CT HEAD WITHOUT CONTRAST  Technique:  Contiguous axial images were obtained from the base of the skull through the vertex without contrast.  Comparison: 09/11/2003 MRI  Findings: Remote infarct involving the inferior left frontal lobe. Within the posterior right parietal lobe, there is an area of hypoattenuation without mass effect, also favored to be  remote infarction.  Age indeterminate right thalamic lacunar infarction. Prominence of the sulci, cisterns, and ventricles, in keeping with volume loss. There are subcortical and periventricular white matter hypodensities, a nonspecific finding most often seen with chronic microangiopathic changes.  There is no evidence for acute hemorrhage, overt hydrocephalus, mass lesion, or abnormal extra-axial fluid collection.  No definite CT evidence for acute cortical based (large artery) infarction. The visualized paranasal sinuses and mastoid air cells are predominately clear.  IMPRESSION: Hypoattenuating areas within the inferior left frontal lobe and posterior right parietal lobe are favored to reflect areas of remote infarction.  White matter hypodensities are a nonspecific finding most in keeping with chronic microangiopathic change.  Age indeterminate right thalamic lacunar infarction. If clinical  concern for acute ischemia persists, MRI recommended.  Original Report Authenticated By: Suanne Marker, M.D.    EKG: SINUS BRADYCARDIA ~ V-rate< 50 INCOMPLETE RIGHT BUNDLE BRANCH BLOCK ~ QRSd >105, terminal axis(90,270) BORDERLINE ST DEPRESSION, LATERAL LEADS ~ ST <-0.66mV, I aVL V5 V6   ASSESSMENT AND PLAN:   The patient was seen today by Dr Angelena Form, the patient evaluated and the data reviewed.  1. Bradycardia: On telemetry, she is in sinus rhythm/sinus bradycardia with HR of 55-70, occasionally dropping into the 30s. Question of junctional rhythm when her heart rate dips into the 30s. We will discontinue the bystolic and follow her heart rhythm. She should be watched on telemetry for 24 hours. She does not have any symptoms that can be attributed to the bradycardia. She will need improved blood pressure control.   2. Hypertension: Per primary M.D.  See my addendum below.  cdm  Signed: Rosaria Ferries 04/01/2011, 9:03 AM   I have personally seen and examined this patient with Rosaria Ferries, PA-C. I  agree with the assessment and plan as outlined above. She has sinus bradycardia with periods of possible junctional rhythm. Her only medication change has been the addition of the beta blocker. Agree with stopping the Bystolic. Monitor on telemetry for 24 hours. I do not think she will need a pacemaker. I would not restart any AV nodal blocking agents. She does need better BP control. Would recommend increasing her Avapro to 300 mg Qdaily. She could also be started on Norvasc. I will see in the am.   Rihan Schueler 11:08 AM 04/01/2011

## 2011-04-01 NOTE — ED Notes (Signed)
Patient returned from CT

## 2011-04-01 NOTE — ED Notes (Signed)
[id number] Expected date:<BR> Expected time:<BR> Means of arrival:<BR> Comments:<BR> EMS/Hypertension/headache

## 2011-04-01 NOTE — ED Notes (Signed)
Headache since 1900 last night. Denies n/v. Pt has hx of hypertension. No stroke symptoms per EMS

## 2011-04-02 ENCOUNTER — Observation Stay (HOSPITAL_COMMUNITY): Payer: Medicare Other

## 2011-04-02 ENCOUNTER — Other Ambulatory Visit (HOSPITAL_COMMUNITY): Payer: Medicare Other

## 2011-04-02 DIAGNOSIS — I495 Sick sinus syndrome: Secondary | ICD-10-CM | POA: Diagnosis not present

## 2011-04-02 DIAGNOSIS — Z8673 Personal history of transient ischemic attack (TIA), and cerebral infarction without residual deficits: Secondary | ICD-10-CM | POA: Diagnosis not present

## 2011-04-02 DIAGNOSIS — G43909 Migraine, unspecified, not intractable, without status migrainosus: Secondary | ICD-10-CM | POA: Diagnosis present

## 2011-04-02 DIAGNOSIS — E039 Hypothyroidism, unspecified: Secondary | ICD-10-CM | POA: Diagnosis present

## 2011-04-02 DIAGNOSIS — R4701 Aphasia: Secondary | ICD-10-CM | POA: Diagnosis not present

## 2011-04-02 DIAGNOSIS — I6350 Cerebral infarction due to unspecified occlusion or stenosis of unspecified cerebral artery: Secondary | ICD-10-CM | POA: Diagnosis not present

## 2011-04-02 DIAGNOSIS — H269 Unspecified cataract: Secondary | ICD-10-CM | POA: Diagnosis present

## 2011-04-02 DIAGNOSIS — I10 Essential (primary) hypertension: Secondary | ICD-10-CM | POA: Diagnosis not present

## 2011-04-02 DIAGNOSIS — I6340 Cerebral infarction due to embolism of unspecified cerebral artery: Secondary | ICD-10-CM | POA: Diagnosis not present

## 2011-04-02 DIAGNOSIS — R51 Headache: Secondary | ICD-10-CM | POA: Diagnosis not present

## 2011-04-02 DIAGNOSIS — H409 Unspecified glaucoma: Secondary | ICD-10-CM | POA: Diagnosis present

## 2011-04-02 DIAGNOSIS — I6330 Cerebral infarction due to thrombosis of unspecified cerebral artery: Secondary | ICD-10-CM | POA: Diagnosis not present

## 2011-04-02 DIAGNOSIS — I498 Other specified cardiac arrhythmias: Secondary | ICD-10-CM | POA: Diagnosis present

## 2011-04-02 DIAGNOSIS — I4891 Unspecified atrial fibrillation: Secondary | ICD-10-CM | POA: Diagnosis not present

## 2011-04-02 DIAGNOSIS — I6789 Other cerebrovascular disease: Secondary | ICD-10-CM | POA: Diagnosis not present

## 2011-04-02 DIAGNOSIS — Z79899 Other long term (current) drug therapy: Secondary | ICD-10-CM | POA: Diagnosis not present

## 2011-04-02 LAB — TSH: TSH: 6.682 u[IU]/mL — ABNORMAL HIGH (ref 0.350–4.500)

## 2011-04-02 MED ORDER — ENOXAPARIN SODIUM 40 MG/0.4ML ~~LOC~~ SOLN
40.0000 mg | SUBCUTANEOUS | Status: DC
  Administered 2011-04-02 – 2011-04-04 (×3): 40 mg via SUBCUTANEOUS
  Filled 2011-04-02 (×4): qty 0.4

## 2011-04-02 NOTE — Progress Notes (Addendum)
PROGRESS NOTE  [name] [id number] DOB: 04/16/1925 DOA: 04/01/2011 PCP: Odette Fraction, MD, MD  Brief narrative: 78 year old woman presented to the emergency department with headache for 1-1/2 weeks, bradycardia and hypertension. Recently started on bysystolic by her primary care physician for hypertension. Was noted to be bradycardic in the emergency department. CT of head was abnormal.  Past medical history: Hypertension, stroke x3, paroxysmal atrial fibrillation, status post atrial ablation, hypothyroidism, migraine headaches  Consultants:  Cardiology  Neurology  Procedures:  2-D echocardiogram:  Bilateral carotid ultrasound:  Antibiotics:  None  Interim History: Chart reviewed. Remains bradycardic. No dysrhythmias. Cleared by cardiology for discharge home. MRI performed February 6 (confirmed with MRI technician) but was temporarily lost in the system.  Subjective: Feels better. Headache resolved. Some nausea but otherwise feels okay. No focal deficits.  Objective: Filed Vitals:   04/01/11 1622 04/01/11 2049 04/02/11 0240 04/02/11 0525  BP:  125/54 127/56 153/53  Pulse:  48 51 47  Temp:  98.2 F (36.8 C) 98.7 F (37.1 C) 98.3 F (36.8 C)  TempSrc:  Oral Oral Oral  Resp:  16 16 16   Height: 5\' 3"  (1.6 m)     Weight: 63.186 kg (139 lb 4.8 oz)     SpO2:  97% 96% 97%    Intake/Output Summary (Last 24 hours) at 04/02/11 0905 Last data filed at 04/02/11 0824  Gross per 24 hour  Intake   1040 ml  Output      0 ml  Net   1040 ml    Exam:  General: Appears calm and comfortable.  Eyes: Pupils equal, round, reactive to light. Normal lids, irises, conjunctiva.  ENT: Grossly normal hearing. Normal lips and tongue.  Cardiovascular: Bradycardic, regular rhythm. No murmur, rub, gallop. No lower extremity edema.  Telemetry: Sinus bradycardia. No dysrhythmias.  Respiratory: Clear to auscultation bilaterally. No wheezes, rales, rhonchi. Normal respiratory  effort.  Skin: Grossly unremarkable.  Musculoskeletal: Tone and strength upper and lower extremities appears grossly normal. No focal deficits noted.  Psychiatric: Grossly normal mood and affect. Speech fluent and appropriate.  Neurologic: Cranial nerves 2-12 intact. No dysdiadochokinesis.  Data Reviewed: Basic Metabolic Panel:  Lab [id number]  NA 143  K 4.5  CL 108  CO2 --  GLUCOSE 105*  BUN 18  CREATININE 1.00  CALCIUM --  MG --  PHOS --   CBC:  Lab 04/01/11 0439 04/01/11 0429  WBC -- 3.9*  NEUTROABS -- 1.9  HGB 13.3 12.9  HCT 39.0 39.0  MCV -- 81.4  PLT -- 172    Studies:  Ct Head Wo Contrast  04/01/2011  *RADIOLOGY REPORT*  Clinical Data: Right-sided headache and visual disturbances  CT HEAD WITHOUT CONTRAST  Technique:  Contiguous axial images were obtained from the base of the skull through the vertex without contrast.  Comparison: 09/11/2003 MRI  Findings: Remote infarct involving the inferior left frontal lobe. Within the posterior right parietal lobe, there is an area of hypoattenuation without mass effect, also favored to be remote infarction.  Age indeterminate right thalamic lacunar infarction. Prominence of the sulci, cisterns, and ventricles, in keeping with volume loss. There are subcortical and periventricular white matter hypodensities, a nonspecific finding most often seen with chronic microangiopathic changes.  There is no evidence for acute hemorrhage, overt hydrocephalus, mass lesion, or abnormal extra-axial fluid collection.  No definite CT evidence for acute cortical based (large artery) infarction. The visualized paranasal sinuses and mastoid air cells are predominately clear.  IMPRESSION: Hypoattenuating areas  within the inferior left frontal lobe and posterior right parietal lobe are favored to reflect areas of remote infarction.  White matter hypodensities are a nonspecific finding most in keeping with chronic microangiopathic change.  Age indeterminate  right thalamic lacunar infarction. If clinical concern for acute ischemia persists, MRI recommended.  Original Report Authenticated By: Suanne Marker, M.D.    Scheduled Meds:    . hydrochlorothiazide  25 mg Oral Daily  . olmesartan  40 mg Oral Daily  . sodium chloride  3 mL Intravenous Q12H  . thyroid  15 mg Oral Daily  . DISCONTD: sodium chloride   Intravenous STAT  . DISCONTD: sodium chloride  3 mL Intravenous Q12H   Continuous Infusions:   . DISCONTD: sodium chloride 125 mL/hr at 04/01/11 1515     Assessment/Plan: 1. Marked sinus bradycardia: Ambulate and monitor heart rate. No AV nodal agents. Followup TSH as an outpatient. Followup with Dr. Angelena Form in 2-3 weeks. 2. Migraine headaches: Resolved. 3. Acute cerebral infarct: Initial CT of the head suggested possible stroke. Initial CT was performed for headaches. Of note patient reports some visual changes over the last one to 2 weeks. However she usually has visual changes with migraine and therefore stroke was felt to be unlikely on admission and therefore stroke evaluation was not initiated at that time. She is allergic to aspirin. Initiate stroke evaluation. Neurology consultation. Defer obtaining of MRA head to neurology. Please order if you desire. Patient has no difficulty swallowing. 4. Hypertension: Increase Avapro to 300 mg daily starting tomorrow. 5. History of paroxysmal atrial fibrillation: Status post ablation. 6. Hypothyroidism: Continue replacement therapy.  Code Status: Full code Family Communication: Discussed with husband at bedside. Disposition Plan: Pending further evaluation.  Addendum 1412: Case discussed with Dr. Nicole Kindred of neurology. He will see the patient in consultation. It was recommended that the patient be transferred to [facility] cone was made followed by the stroke team. Patient is agreeable to this. Plan transferred today to [facility] Team 2   Murray Hodgkins, MD  [facility] Pager [phone] 04/02/2011, 9:05 AM    LOS: 1 day

## 2011-04-02 NOTE — Progress Notes (Signed)
Bilateral:  No evidence of hemodynamically significant internal carotid artery stenosis.   Vertebral artery flow is antegrade.     [name], [place] D., RVS 04/02/2011 2:30 PM

## 2011-04-02 NOTE — Progress Notes (Signed)
SUBJECTIVE: No dizziness, chest pain, SOB, near syncope. HA is resolved.   BP 153/53  Pulse 47  Temp(Src) 98.3 F (36.8 C) (Oral)  Resp 16  Ht 5\' 3"  (1.6 m)  Wt 139 lb 4.8 oz (63.186 kg)  BMI 24.68 kg/m2  SpO2 97%  Intake/Output Summary (Last 24 hours) at 04/02/11 0616 Last data filed at 04/02/11 0526  Gross per 24 hour  Intake    600 ml  Output      0 ml  Net    600 ml    PHYSICAL EXAM General: Well developed, well nourished, in no acute distress. Alert and oriented x 3.  Psych:  Good affect, responds appropriately Neck: No JVD. No masses noted.  Lungs: Clear bilaterally with no wheezes or rhonci noted.  Heart: RRR with no murmurs noted. Abdomen: Bowel sounds are present. Soft, non-tender.  Extremities: No lower extremity edema.   LABS: Basic Metabolic Panel:  Basename 04/01/11 0439  NA 143  K 4.5  CL 108  CO2 --  GLUCOSE 105*  BUN 18  CREATININE 1.00  CALCIUM --  MG --  PHOS --   CBC:  Basename 04/01/11 0439 04/01/11 0429  WBC -- 3.9*  NEUTROABS -- 1.9  HGB 13.3 12.9  HCT 39.0 39.0  MCV -- 81.4  PLT -- 172    Current Meds:    . hydrochlorothiazide  25 mg Oral Daily  . olmesartan  40 mg Oral Daily  . sodium chloride  3 mL Intravenous Q12H  . thyroid  15 mg Oral Daily  . DISCONTD: sodium chloride   Intravenous STAT  . DISCONTD: sodium chloride  3 mL Intravenous Q12H     ASSESSMENT AND PLAN:  1. Sinus bradycardia:  She remains in sinus bradycardia with no evidence of heart block. She is asymptomatic in regards to this. NO dizziness. Some of the beta blocker may still be present. Would have her ambulate today and watch heart rate response. No indication for a pacemaker at this time. Avoid all AV nodal blocking agents. Will add TSH to am labs.  From a cardiac standpoint, she could be discharged home today. I will be glad to see her in my office in the next 2-3 weeks for follow up.   2. HTN: Per primary team. She states that she does not tolerate any  medications but does tolerate Avapro. It may be reasonable to increase her Avapro to 300 mg po Qdaily. She thinks she had problems in the past with HCTZ. She can f/u in primary care for adjustment of BP meds and for f/u BMET.   Barbaraann Avans  2/7/20136:16 AM

## 2011-04-02 NOTE — Progress Notes (Signed)
   CARE MANAGEMENT NOTE 04/02/2011  Patient:  [name], [name]   Account Number:  [id number]  Date Initiated:  04/02/2011  Documentation initiated by:  Dessa Phi  Subjective/Objective Assessment:   ADMITTED W/HTN.HX; STROKE,MIGRAINES.     Action/Plan:   FROM HOME W/SPOUSE.   Anticipated DC Date:  04/06/2011   Anticipated DC Plan:  [place]         Choice offered to / List presented to:             Status of service:  Completed, signed off Medicare Important Message given?   (If response is "NO", the following Medicare IM given date fields will be blank) Date Medicare IM given:   Date Additional Medicare IM given:    Discharge Disposition:  ACUTE TO ACUTE TRANS  Per UR Regulation:  Reviewed for med. necessity/level of care/duration of stay  Comments:  04/02/11 Marrian Bells RN,BSN [facility] [place].NEURO FOLLOWING.

## 2011-04-02 NOTE — Progress Notes (Signed)
  Echocardiogram 2D Echocardiogram has been performed.  [name] 04/02/2011, 11:27 AM

## 2011-04-03 LAB — HEMOGLOBIN A1C: Hgb A1c MFr Bld: 6.1 % — ABNORMAL HIGH (ref <5.7)

## 2011-04-03 LAB — LIPID PANEL: LDL Cholesterol: 143 mg/dL — ABNORMAL HIGH (ref 0–99)

## 2011-04-03 NOTE — Progress Notes (Signed)
Occupational Therapy Evaluation Patient Details Name: [name] MRN: [id number] DOB: 30-Jan-1926 Today's Date: 04/03/2011  Problem List:  Patient Active Problem List  Diagnoses  . PAF (paroxysmal atrial fibrillation)  . Stroke  . Hypertension  . Bradycardia    Past Medical History:  Past Medical History  Diagnosis Date  . Hypertension   . Stroke   . Palpitations   . PAF (paroxysmal atrial fibrillation)   . Hypothyroidism   . Migraine    Past Surgical History:  Past Surgical History  Procedure Date  . A flutter ablation     2006    OT Assessment/Plan/Recommendation OT Assessment Clinical Impression Statement: PT admitted with severe headache for ~1 week and varying BP values.  MRI findings are the following: Acute 1 cm right occipital cortical infarct without hemorrhage.  Pt able to demonstrate functional transfers and BADLs with min guard assist.  Pt will have necessary level of assist upon d/c home with husband.  All education complete.  No further acute OT services needed. OT Recommendation/Assessment: Patient does not need any further OT services OT Recommendation Follow Up Recommendations: Supervision - Intermittent Equipment Recommended: None recommended by PT;None recommended by OT OT Goals    OT Evaluation Precautions/Restrictions  Precautions Precautions: Fall Prior Functioning Home Living Lives With: Spouse Receives Help From: Family Type of Home: House Home Layout: One level Home Access: Stairs to enter Entrance Stairs-Rails: Psychiatric nurse of Steps: 4 Bathroom Shower/Tub: Multimedia programmer: Standard Bathroom Accessibility: Yes How Accessible: Accessible via walker Home Adaptive Equipment: Built-in shower seat Prior Function Level of Independence: Independent with basic ADLs;Independent with gait;Independent with transfers Able to Take Stairs?: Yes Driving: Yes Vocation: Retired ADL ADL Grooming: Wash/dry  hands;Wash/dry face;Simulated;Supervision/safety Where Assessed - Grooming: Standing at sink Lower Body Dressing: Simulated;Modified independent Where Assessed - Lower Body Dressing: Sitting, bed Toilet Transfer: Simulated;Minimal assistance (min guard) Toilet Transfer Method: Ambulating Tub/Shower Transfer: Simulated;Minimal assistance;Other (comment) (min guard) Tub/Shower Transfer Details (indicate cue type and reason): Simulated walk in shower.  Min guard for safety and balance. Ambulation Related to ADLs: Min guard for ambulation within room.  Pt with loss of balance 1x with min assist to regain balance. Vision/Perception  Vision - History Baseline Vision: Wears glasses all the time Patient Visual Report: No change from baseline Vision - Assessment Eye Alignment: Within Functional Limits Vision Assessment: Vision tested Ocular Range of Motion: Within Functional Limits Tracking/Visual Pursuits: Able to track stimulus in all quads without difficulty Visual Fields: No apparent deficits Cognition Cognition Arousal/Alertness: Awake/alert Overall Cognitive Status: Appears within functional limits for tasks assessed Orientation Level: Oriented X4 Sensation/Coordination Sensation Light Touch: Appears Intact Proprioception: Appears Intact Coordination Gross Motor Movements are Fluid and Coordinated: Yes (bil. UE) Fine Motor Movements are Fluid and Coordinated: Yes (bil. UE) Extremity Assessment RUE Assessment RUE Assessment: Within Functional Limits LUE Assessment LUE Assessment: Within Functional Limits Mobility  Bed Mobility Bed Mobility: Yes Supine to Sit: 5: Supervision Transfers Sit to Stand: 6: Modified independent (Device/Increase time) Stand to Sit: 6: Modified independent (Device/Increase time) Exercises   End of Session OT - End of Session Equipment Utilized During Treatment: Gait belt Activity Tolerance: Patient tolerated treatment well;Patient limited by  fatigue Patient left: in chair;with call bell in reach Nurse Communication: Mobility status for ambulation General Behavior During Session: [facility] for tasks performed Cognition: [facility] for tasks performed   Darrol Jump 04/03/2011, 5:18 PM  04/03/2011 Darrol Jump OTR/L Pager [phone] Office [phone]

## 2011-04-03 NOTE — Progress Notes (Signed)
(  [facility] to be transferred to [facility])  Report called to Vicente Males, [place] ([phone]) & [facility] called for transport/report given. Patient notified of ETA of [place] assisted with gathering belongings. [facility] at facility @ 2040 & departed with the patient @ 2050.  Keitha Butte, RN

## 2011-04-03 NOTE — Progress Notes (Addendum)
Subjective: No acute issue overnight.    Objective: Filed Vitals:   04/03/11 0400 04/03/11 0600 04/03/11 1036 04/03/11 1410  BP: 152/73 160/78 160/58 168/64  Pulse: 50 50 46 51  Temp: 97.8 F (36.6 C) 97.3 F (36.3 C) 98.2 F (36.8 C) 98.5 F (36.9 C)  TempSrc: Oral Oral Oral Oral  Resp: 19 19 18 18   Height:      Weight:      SpO2: 97% 97% 98% 97%   Weight change:   Intake/Output Summary (Last 24 hours) at 04/03/11 1754 Last data filed at 04/02/11 1854  Gross per 24 hour  Intake    320 ml  Output      0 ml  Net    320 ml    General: Alert, awake, oriented x3, in no acute distress.  HEENT: No bruits, no goiter.  Heart: Regular rate and rhythm, without murmurs, rubs, gallops.  Lungs: Clear to auscultation BL Abdomen: Soft, nontender, nondistended, positive bowel sounds.  Neuro: Pt responds to questions appropriately   Lab Results:  [facility] 04/01/11 0439  NA 143  K 4.5  CL 108  CO2 --  GLUCOSE 105*  BUN 18  CREATININE 1.00  CALCIUM --  MG --  PHOS --   No results found for this basename: AST:2,ALT:2,ALKPHOS:2,BILITOT:2,PROT:2,ALBUMIN:2 in the last 72 hours No results found for this basename: LIPASE:2,AMYLASE:2 in the last 72 hours  Basename 04/01/11 0439 04/01/11 0429  WBC -- 3.9*  NEUTROABS -- 1.9  HGB 13.3 12.9  HCT 39.0 39.0  MCV -- 81.4  PLT -- 172   No results found for this basename: CKTOTAL:3,CKMB:3,CKMBINDEX:3,TROPONINI:3 in the last 72 hours No components found with this basename: POCBNP:3 No results found for this basename: DDIMER:2 in the last 72 hours No results found for this basename: HGBA1C:2 in the last 72 hours  Basename 04/03/11 0614  CHOL 233*  HDL 73  LDLCALC 143*  TRIG 85  CHOLHDL 3.2  LDLDIRECT --    Basename 04/02/11 0745  TSH 6.682*  T4TOTAL --  T3FREE --  THYROIDAB --   No results found for this basename: VITAMINB12:2,FOLATE:2,FERRITIN:2,TIBC:2,IRON:2,RETICCTPCT:2 in the last 72 hours  Micro Results: No  results found for this or any previous visit (from the past 240 hour(s)).  Studies/Results: Mr Virgel Paling [id number] Contrast  04/02/2011  *RADIOLOGY REPORT*  Clinical Data: Right occipital infarct. Acute but ill-defined cerebrovascular disease.  Aphasia.  Previous strokes.  MRA HEAD WITHOUT CONTRAST  Technique: Angiographic images of the Circle of Willis were obtained using MRA technique without intravenous contrast.  Comparison: MRI brain 04/01/2011  Findings: Widely patent carotid and basilar arteries.  Left greater than right vertebral arteries contribute to formation of the basilar.  There is no proximal flow limiting stenosis of the anterior, middle, posterior cerebral arteries. Mild nonstenotic irregularity can be seen in the P1 segment right posterior cerebral artery (image 11 series 306.) There is poor flow related enhancement in the distal most aspect of the right posterior cerebral artery which could be responsible for the observed pattern of infarction, representing intracranial atherosclerotic change. No visible intracranial berry aneurysm.  IMPRESSION: Poor flow related enhancement distal right PCA.  Mild nonstenotic irregularity right P1 segment.  No carotid or basilar stenosis is observed.  Original Report Authenticated By: Staci Righter, M.D.   Mr Brain Wo Contrast  04/02/2011  *RADIOLOGY REPORT*  Clinical Data: Headache.  Sinus bradycardia.  Hypertension. Previous strokes.  MRI HEAD WITHOUT CONTRAST  Technique:  Multiplanar, multiecho pulse sequences  of the brain and surrounding structures were obtained according to standard protocol without intravenous contrast.  Comparison: Most recent CT 04/01/2011.  Previous MRI 09/11/2003.  Findings: Difficulty with completion of the exam led to a delay in interpretation of the exam before the images reached the appropriate worklist.  There is an acute 1 cm right occipital cortical infarct without hemorrhage (image 11-13 of series 3).  There is no mass lesion or  hydrocephalus.  Advanced atrophy is present with chronic microvascular ischemic change.  There are remote left frontal and right parietal infarcts. No midline shift.  Major intracranial vascular structures patent. Unremarkable cerebellar tonsils.  Empty sella.  Cervical spondylosis is incompletely evaluated.  Mild chronic sinus disease. No mastoid fluid.  The acute infarct is seen as an area of hypodensity on prior CT.  IMPRESSION:  Acute 1 cm right occipital cortical infarct without hemorrhage.  No mass lesion or hydrocephalus. Atrophy and small vessel disease. Remote bihemispheric infarcts.  I personally discussed these findings with the ordering physician at the time of interpretation.  Original Report Authenticated By: Staci Righter, M.D.    Medications: I have reviewed the patient's current medications.   Patient Active Hospital Problem List: CVA:  Neuro on board reportedly.  Will follow up with their recommendations.  Acute 1 cm right occipital cortical infarct without hemorrhage.   Hypertension  Will monitor and consider adding other agents currently on Benicar which is at maximum dose.  On prior notes cardiology states that they would recommend increasing patient's Avapro but while here patient is on Benicar at maximum dose.  Bradycardia (04/01/2011) Cards recommends discontinuing bystolic and monitoring.  Pt's blood pressures have been elevated to 168/64 today.     LOS: 2 days   Velvet Bathe M.D.  [facility] 04/03/2011, 5:54 PM

## 2011-04-03 NOTE — Evaluation (Signed)
Physical Therapy Evaluation Patient Details Name: [name] MRN: [id number] DOB: November 16, 1925 Today's Date: 04/03/2011  Problem List:  Patient Active Problem List  Diagnoses  . PAF (paroxysmal atrial fibrillation)  . Stroke  . Hypertension  . Bradycardia    Past Medical History:  Past Medical History  Diagnosis Date  . Hypertension   . Stroke   . Palpitations   . PAF (paroxysmal atrial fibrillation)   . Hypothyroidism   . Migraine    Past Surgical History:  Past Surgical History  Procedure Date  . A flutter ablation     2006    PT Assessment/Plan/Recommendation PT Assessment Clinical Impression Statement: Pt is 78 y/o female admitted with severe headache for ~1 week and varying BP values.  MRI findings are the following: Acute 1 cm right occipital cortical infarct without hemorrhage.  Pt will benefit from acute PT services to address balance and to maximize independence prior to d/c home.  PT Recommendation/Assessment: Patient will need skilled PT in the acute care venue PT Problem List: Decreased activity tolerance;Decreased balance;Decreased knowledge of use of DME PT Therapy Diagnosis : Abnormality of gait PT Plan PT Frequency: Min 4X/week PT Treatment/Interventions: Gait training;Stair training;Functional mobility training;Therapeutic activities;Therapeutic exercise;Balance training;Neuromuscular re-education;Patient/family education PT Recommendation Follow Up Recommendations: Outpatient PT (vs none) Equipment Recommended: None recommended by PT PT Goals  Acute Rehab PT Goals PT Goal Formulation: With patient Time For Goal Achievement: 7 days Pt will go Supine/Side to Sit: Independently PT Goal: Supine/Side to Sit - Progress: Goal set today Pt will go Sit to Stand: Independently PT Goal: Sit to Stand - Progress: Goal set today Pt will go Stand to Sit: Independently PT Goal: Stand to Sit - Progress: Goal set today Pt will Ambulate: >150 feet;with modified  independence;with least restrictive assistive device PT Goal: Ambulate - Progress: Goal set today Additional Goals Additional Goal #1: Pt will score > 22/24 on DGI to reduce fall risk PT Goal: Additional Goal #1 - Progress: Goal set today  PT Evaluation Precautions/Restrictions  Precautions Precautions: Fall Prior Functioning  Home Living Lives With: Spouse Receives Help From: Family Type of Home: House Home Layout: One level Home Access: Stairs to enter Entrance Stairs-Rails: Psychiatric nurse of Steps: 4 Bathroom Shower/Tub: Multimedia programmer: Standard Bathroom Accessibility: Yes How Accessible: Accessible via walker Home Adaptive Equipment: Built-in shower seat Prior Function Level of Independence: Independent with basic ADLs;Independent with gait;Independent with transfers Able to Take Stairs?: Yes Driving: Yes Vocation: Retired Artist: Awake/alert Overall Cognitive Status: Appears within functional limits for tasks assessed Orientation Level: Oriented X4 Sensation/Coordination Sensation Light Touch: Appears Intact Proprioception: Appears Intact Extremity Assessment RUE Assessment RUE Assessment: Within Functional Limits LUE Assessment LUE Assessment: Within Functional Limits RLE Assessment RLE Assessment: Within Functional Limits LLE Assessment LLE Assessment: Within Functional Limits Mobility (including Balance) Bed Mobility Bed Mobility: Yes Supine to Sit: 5: Supervision Transfers Transfers: Yes Sit to Stand: 6: Modified independent (Device/Increase time) Stand to Sit: 6: Modified independent (Device/Increase time) Ambulation/Gait Ambulation/Gait: Yes Ambulation/Gait Assistance: 4: Min assist (minguard) Ambulation/Gait Assistance Details (indicate cue type and reason): Minguard for safety due to LOB during DGI stepping over objects.  Pt with occasional staggered gait with unsteadiness during  cognitive task. Ambulation Distance (Feet): 200 Feet Assistive device: None Gait Pattern: Step-through pattern Stairs: No Wheelchair Mobility Wheelchair Mobility: No  Posture/Postural Control Posture/Postural Control: No significant limitations Balance Balance Assessed: Yes Static Sitting Balance Static Sitting - Balance Support: Feet supported Static Sitting -  Level of Assistance: 7: Independent Dynamic Gait Index Level Surface: Normal Change in Gait Speed: Mild Impairment Gait with Horizontal Head Turns: Normal Gait with Vertical Head Turns: Normal Gait and Pivot Turn: Normal Step Over Obstacle: Severe Impairment Step Around Obstacles: Mild Impairment Steps: Mild Impairment Total Score: 18  Exercise    End of Session PT - End of Session Equipment Utilized During Treatment: Gait belt Activity Tolerance: Patient tolerated treatment well Patient left: in chair;with call bell in reach Nurse Communication: Mobility status for transfers;Mobility status for ambulation General Behavior During Session: [facility] for tasks performed Cognition: [facility] for tasks performed  Ferris Fielden 04/03/2011, 3:10 PM [id number]

## 2011-04-04 ENCOUNTER — Inpatient Hospital Stay (HOSPITAL_COMMUNITY): Payer: Medicare Other

## 2011-04-04 ENCOUNTER — Other Ambulatory Visit (HOSPITAL_COMMUNITY): Payer: Medicare Other

## 2011-04-04 DIAGNOSIS — I6330 Cerebral infarction due to thrombosis of unspecified cerebral artery: Secondary | ICD-10-CM | POA: Diagnosis not present

## 2011-04-04 DIAGNOSIS — I6340 Cerebral infarction due to embolism of unspecified cerebral artery: Secondary | ICD-10-CM | POA: Diagnosis not present

## 2011-04-04 DIAGNOSIS — I498 Other specified cardiac arrhythmias: Secondary | ICD-10-CM | POA: Diagnosis not present

## 2011-04-04 DIAGNOSIS — I6350 Cerebral infarction due to unspecified occlusion or stenosis of unspecified cerebral artery: Secondary | ICD-10-CM | POA: Diagnosis not present

## 2011-04-04 MED ORDER — ATORVASTATIN CALCIUM 10 MG PO TABS
20.0000 mg | ORAL_TABLET | Freq: Every day | ORAL | Status: DC
  Filled 2011-04-04: qty 2

## 2011-04-04 MED ORDER — ROSUVASTATIN CALCIUM 10 MG PO TABS
10.0000 mg | ORAL_TABLET | Freq: Every day | ORAL | Status: DC
  Filled 2011-04-04: qty 1

## 2011-04-04 MED ORDER — AMLODIPINE BESYLATE 5 MG PO TABS: 5.0000 mg | ORAL_TABLET | Freq: Every day | ORAL | Status: DC

## 2011-04-04 MED ORDER — IRBESARTAN 150 MG PO TABS: 300.0000 mg | ORAL_TABLET | Freq: Every day | ORAL | Status: DC

## 2011-04-04 MED ORDER — AMLODIPINE BESYLATE 5 MG PO TABS
5.0000 mg | ORAL_TABLET | Freq: Every day | ORAL | Status: DC
  Administered 2011-04-04: 5 mg via ORAL
  Filled 2011-04-04: qty 1

## 2011-04-04 MED ORDER — NON FORMULARY: 20.0000 mg | Freq: Every day | Status: DC

## 2011-04-04 MED ORDER — GADOBENATE DIMEGLUMINE 529 MG/ML IV SOLN
15.0000 mL | Freq: Once | INTRAVENOUS | Status: AC | PRN
  Administered 2011-04-04: 15 mL via INTRAVENOUS

## 2011-04-04 NOTE — Consult Note (Signed)
[facility] Consult Note  Date: 04/04/2011  Patient name: [name] Medical record number: [id number] Date of birth: [birth year] Age: 78 y.o. Gender: female   Chief Complaint: eval acute stroke on MRI  History of Present Illness: [name] is an 78 y.o.AA female with a history of CVA, PAF requiring ablation and hypertension.  Admitted to the hospital 04/01/11 for evaluation of headaches and hypertension- SBP 200.  Head Ct ordered for HA revealed possible acute stroke in R occipito-parietal area.  MRI confirmed acute 1 cm right occipital cortical infarct without hemorrhage.  Neurology consult now requested for management recommendations.  Meds Inpatient  Scheduled:   . enoxaparin (LOVENOX) injection  40 mg Subcutaneous Q24H  . olmesartan  40 mg Oral Daily  . sodium chloride  3 mL Intravenous Q12H  . thyroid  15 mg Oral Daily    Prior to Admission medications   Medication Sig Start Date End Date Taking? Authorizing Provider  irbesartan (AVAPRO) 150 MG tablet Take 150 mg by mouth daily.   Yes Historical Provider, MD  nebivolol (BYSTOLIC) 10 MG tablet Take 10 mg by mouth daily.   Yes Historical Provider, MD  thyroid (ARMOUR) 15 MG tablet Take 15 mg by mouth daily.   Yes Historical Provider, MD     Allergies: Aspirin  Past Medical History  Diagnosis Date  . Hypertension   . Stroke   . Palpitations   . PAF (paroxysmal atrial fibrillation)   . Hypothyroidism   . Migraine    Past Surgical History  Procedure Date  . A flutter ablation     2006   Family History  Problem Relation Age of Onset  . Hypertension Mother    Social History: Lives with her husband, reports that she has never smoked. She has never used smokeless tobacco. She reports that she does not drink alcohol or use illicit drugs.  Review of Systems:   Examination:  Blood pressure 156/69, pulse 52, temperature 97.2 F (36.2 C), temperature source Oral, resp. rate 18, height 5\' 3"  (1.6 m),  weight 63.186 kg (139 lb 4.8 oz), SpO2 100.00%.  In general, well developed, well nourished.  Attended by her husband. Cardiovascular: The patient has a regular rate and rhythm and no carotid bruits. Distal pulses are intact.  Mental status:   The patient is oriented to person, place and time. Recent and remote memory are intact. Attention span and concentration are normal. Language including repetition, naming, following commands are intact. Fund of knowledge of current and historical events, as well as vocabulary are normal. No aphasia or dysarthria.  Cranial Nerves: Pupils are equally round and reactive to light. Visual fields full to confrontation. Extraocular movements are intact without nystagmus. Facial sensation and muscles of mastication are intact. Muscles of facial expression are symmetric. Hearing intact to bilateral finger rub. Tongue protrusion, uvula, palate midline.  Shoulder shrug intact  Motor: Strength 5/5 bilaterally.   The patient has normal bulk and tone of all extremities, no pronator drift.  There are no adventitious movements.   Reflexes:   Biceps  Triceps Brachioradialis Knee Ankle  Right 2+  2+  2+   2+ 0  Left  2+  2+  2+   2+ 0  Plantars: equivocal.  Coordination:  Normal finger to nose, heel to shin.  Rapid alternating movements intact.  Sensation: intact to light tough and pinprick throughout.  Discrimination intact.   Labs: Results for orders placed during the hospital encounter of 04/01/11 (from  the past 48 hour(s))  LIPID PANEL     Status: Abnormal   Collection Time   04/03/11  6:14 AM      Component Value Range Comment   Cholesterol 233 (*) 0 - 200 (mg/dL)    Triglycerides 85  <150 (mg/dL)    HDL 73  >39 (mg/dL)    Total CHOL/HDL Ratio 3.2      VLDL 17  0 - 40 (mg/dL)    LDL Cholesterol 143 (*) 0 - 99 (mg/dL)     Imaging: 04/02/2011   MRA HEAD WITHOUT CONTRAST   Findings: Widely patent carotid and basilar arteries.  Left greater than right  vertebral arteries contribute to formation of the basilar.  There is no proximal flow limiting stenosis of the anterior, middle, posterior cerebral arteries. Mild nonstenotic irregularity can be seen in the P1 segment right posterior cerebral artery (image 11 series 306.) There is poor flow related enhancement in the distal most aspect of the right posterior cerebral artery which could be responsible for the observed pattern of infarction, representing intracranial atherosclerotic change. No visible intracranial berry aneurysm.  IMPRESSION: Poor flow related enhancement distal right PCA.  Mild nonstenotic irregularity right P1 segment.  No carotid or basilar stenosis is observed.  Staci Righter, M.D.   04/02/2011 MRI HEAD WITHOUT CONTRAST Findings: Difficulty with completion of the exam led to a delay in interpretation of the exam before the images reached the appropriate worklist.  There is an acute 1 cm right occipital cortical infarct without hemorrhage (image 11-13 of series 3).  There is no mass lesion or hydrocephalus.  Advanced atrophy is present with chronic microvascular ischemic change.  There are remote left frontal and right parietal infarcts. No midline shift.  Major intracranial vascular structures patent. Unremarkable cerebellar tonsils.  Empty sella.  Cervical spondylosis is incompletely evaluated.  Mild chronic sinus disease. No mastoid fluid.  The acute infarct is seen as an area of hypodensity on prior CT.  IMPRESSION:  Acute 1 cm right occipital cortical infarct without hemorrhage.  No mass lesion or hydrocephalus. Atrophy and small vessel disease. Remote bihemispheric infarcts. Staci Righter, M.D.   04/02/11 2D ECHO Left ventricle: The cavity size was normal. Systolic function was normal. The estimated ejection fraction was in the range of 55% to 60%. Wall motion was normal; there were no regional wall motion abnormalities.  04/02/11 CAROTID DOPPLERS Bilateral: No evidence of hemodynamically  significant internal carotid artery stenosis. Vertebral artery flow is antegrade.   Assessment/Recommendations:   Ms. [name] is a 78 y.o. female with  1.  Acute 1 cm right occipital cortical ischemic infarct without hemorrhage.  Asymptomatic. Patient has had multiple strokes in the past (bilateral, anterior and posterior circulation), at least some of which occurred in the setting of Afib.  Had been on Coumadin in the past until ablation in 2006.  TTE does not suggest cardioembolic source, and patient has maintained SR while here.  None the less, the possibility of cardioembolic source and Coumadin therapy should be considered.  MRA shows potential focal lesion in the proximal right PCA which be the source of artery-artery embolism.  - consider antiocoagulation with coumadin (given history of paroxysmal afib, with recurrent strokes); may need to get cardiology input and possible TEE - if not anticoagulation candidate, then would need single antiplatelet therapy (has allergy to Aspirin) so could consider clopidogrel 75mg  daily instead - LDL is 143, goal <100.  Advise improved lipid control with addition of  a statin - BP control with PCP f/u   LOS: 3 days   Geraldine Solar [facility] [id number] 04/04/2011  8:45 AM  I evaluated patient and agree with plan. Alila Sotero

## 2011-04-04 NOTE — Progress Notes (Signed)
   CARE MANAGEMENT NOTE 04/04/2011  Patient:  [name]   Account Number:  [id number]  Date Initiated:  04/02/2011  Documentation initiated by:  Dessa Phi  Subjective/Objective Assessment:   ADMITTED W/HTN.HX; STROKE,MIGRAINES.     Action/Plan:   FROM HOME W/SPOUSE.   Anticipated DC Date:  04/06/2011   Anticipated DC Plan:  [place]  CM consult      [facility] Choice  HOME HEALTH   Choice offered to / List presented to:  C-1 Patient        [place] arranged  [place] [facility]      [place].   Status of service:  Completed, signed off Medicare Important Message given?   (If response is "NO", the following Medicare IM given date fields will be blank) Date Medicare IM given:   Date Additional Medicare IM given:    Discharge Disposition:  [place]  Per UR Regulation:  Reviewed for med. necessity/level of care/duration of stay  Comments:  04/04/2011 1700 Spoke to pt and offered [facility] list to pt. Pt requested [facility]. Contacted [facility]. Faxed orders, F2F, and d/c summary to [facility] for [facility]. Pt does not feel she needs any DME for Home.   Jonnie Finner RN CCM Case Mgmt phone [phone]   04/03/11- Jenner RN, BSN [phone] Pt admitted with stroke- stroke work up in progress- per PT note recommending possible outpt therapy vs none depending on progress. CM to follow.  04/02/11 KATHY MAHABIR RN,BSN NCM [place] FOLLOWING.

## 2011-04-04 NOTE — Discharge Summary (Signed)
Admit date: 04/01/2011 Discharge date: 04/04/2011  Primary Care Physician:  Odette Fraction, MD, MD   Discharge Diagnoses:   No resolved problems to display.  Active Hospital Problems  Diagnoses Date Noted   . Cerebral infarct 04/04/2011     Priority: High  . Bradycardia 04/01/2011   . Hypertension    . Stroke      Resolved Hospital Problems  Diagnoses Date Noted Date Resolved     DISCHARGE MEDICATION: Medication List  As of 04/04/2011  3:44 PM   STOP taking these medications         nebivolol 10 MG tablet         TAKE these medications         amLODipine 5 MG tablet   Commonly known as: NORVASC   Take 1 tablet (5 mg total) by mouth daily.      irbesartan 150 MG tablet   Commonly known as: AVAPRO   Take 2 tablets (300 mg total) by mouth daily.      thyroid 15 MG tablet   Commonly known as: ARMOUR   Take 15 mg by mouth daily.              Consults: Treatment Team:  Lauree Chandler, MD   SIGNIFICANT DIAGNOSTIC STUDIES:  Ct Head Wo Contrast  04/01/2011  *RADIOLOGY REPORT*  Clinical Data: Right-sided headache and visual disturbances  CT HEAD WITHOUT CONTRAST  Technique:  Contiguous axial images were obtained from the base of the skull through the vertex without contrast.  Comparison: 09/11/2003 MRI  Findings: Remote infarct involving the inferior left frontal lobe. Within the posterior right parietal lobe, there is an area of hypoattenuation without mass effect, also favored to be remote infarction.  Age indeterminate right thalamic lacunar infarction. Prominence of the sulci, cisterns, and ventricles, in keeping with volume loss. There are subcortical and periventricular white matter hypodensities, a nonspecific finding most often seen with chronic microangiopathic changes.  There is no evidence for acute hemorrhage, overt hydrocephalus, mass lesion, or abnormal extra-axial fluid collection.  No definite CT evidence for acute cortical based (large artery)  infarction. The visualized paranasal sinuses and mastoid air cells are predominately clear.  IMPRESSION: Hypoattenuating areas within the inferior left frontal lobe and posterior right parietal lobe are favored to reflect areas of remote infarction.  White matter hypodensities are a nonspecific finding most in keeping with chronic microangiopathic change.  Age indeterminate right thalamic lacunar infarction. If clinical concern for acute ischemia persists, MRI recommended.  Original Report Authenticated By: Suanne Marker, M.D.   Mr [facility] Wo Contrast  04/02/2011  *RADIOLOGY REPORT*  Clinical Data: Right occipital infarct. Acute but ill-defined cerebrovascular disease.  Aphasia.  Previous strokes.  MRA HEAD WITHOUT CONTRAST  Technique: Angiographic images of the Circle of Willis were obtained using MRA technique without intravenous contrast.  Comparison: MRI brain 04/01/2011  Findings: Widely patent carotid and basilar arteries.  Left greater than right vertebral arteries contribute to formation of the basilar.  There is no proximal flow limiting stenosis of the anterior, middle, posterior cerebral arteries. Mild nonstenotic irregularity can be seen in the P1 segment right posterior cerebral artery (image 11 series 306.) There is poor flow related enhancement in the distal most aspect of the right posterior cerebral artery which could be responsible for the observed pattern of infarction, representing intracranial atherosclerotic change. No visible intracranial berry aneurysm.  IMPRESSION: Poor flow related enhancement distal right PCA.  Mild nonstenotic irregularity right P1 segment.  No carotid or basilar stenosis is observed.  Original Report Authenticated By: Staci Righter, M.D.   Mr Angiogram Neck W Wo Contrast  04/04/2011  *RADIOLOGY REPORT*  Clinical Data:  Recent right occipital infarct.  Hypertension.  MRA NECK WITH CONTRAST  Technique:  Angiographic images of the neck were obtained using MRA  technique with intravenous contrast.  Carotid stenosis measurements (when applicable) are obtained utilizing NASCET criteria, using the distal internal carotid diameter as the denominator.  Contrast: 17mL MULTIHANCE GADOBENATE DIMEGLUMINE 529 MG/ML IV SOLN  Comparison:  MRI head 04/01/2011  Findings:  Carotid artery is widely patent bilaterally.  Normal carotid bifurcation.  No evidence of carotid stenosis or dissection.  Both vertebral arteries are widely patent without stenosis.  Left vertebral artery is dominant.  Jugular vein is patent bilaterally.  Aortic arch and proximal great vessels are widely patent.  IMPRESSION: Normal MRA neck with contrast.  Original Report Authenticated By: Truett Perna, M.D.   Mr Brain Wo Contrast  04/02/2011  *RADIOLOGY REPORT*  Clinical Data: Headache.  Sinus bradycardia.  Hypertension. Previous strokes.  MRI HEAD WITHOUT CONTRAST  Technique:  Multiplanar, multiecho pulse sequences of the brain and surrounding structures were obtained according to standard protocol without intravenous contrast.  Comparison: Most recent CT 04/01/2011.  Previous MRI 09/11/2003.  Findings: Difficulty with completion of the exam led to a delay in interpretation of the exam before the images reached the appropriate worklist.  There is an acute 1 cm right occipital cortical infarct without hemorrhage (image 11-13 of series 3).  There is no mass lesion or hydrocephalus.  Advanced atrophy is present with chronic microvascular ischemic change.  There are remote left frontal and right parietal infarcts. No midline shift.  Major intracranial vascular structures patent. Unremarkable cerebellar tonsils.  Empty sella.  Cervical spondylosis is incompletely evaluated.  Mild chronic sinus disease. No mastoid fluid.  The acute infarct is seen as an area of hypodensity on prior CT.  IMPRESSION:  Acute 1 cm right occipital cortical infarct without hemorrhage.  No mass lesion or hydrocephalus. Atrophy and small  vessel disease. Remote bihemispheric infarcts.  I personally discussed these findings with the ordering physician at the time of interpretation.  Original Report Authenticated By: Staci Righter, M.D.     ECHO: Study Conclusions  Left ventricle: The cavity size was normal. Systolic function was normal. The estimated ejection fraction was in the range of 55% to 60%. Wall motion was normal; there were no regional wall motion abnormalities. Transthoracic echocardiography. M-mode, complete 2D, spectral Doppler, and color Doppler. Height: Height: 160cm. Height: 63in. Weight: Weight: 63.1kg. Weight: 138.7lb. Body mass index: BMI: 24.6kg/m^2. Body surface area: BSA: 1.15m^2. Blood pressure: 153/53. Patient status: Inpatient. Location: Bedside.        CARDIAC CATH & OTHER PROCEDURES: Please see above results of MRI/MRA  No results found for this or any previous visit (from the past 240 hour(s)).  BRIEF ADMITTING H & P: 78 year old woman presents to the emergency department with complaint of headache for one and a half weeks, bradycardia, hypertension.   Was evaluated by her cardiologist that recommended discontinuing bystolic and maximum dose of her ARB.  Also recommended adding norvasc and since her blood pressures remained elevated throughout the admission this was also added. Further evaluation of the headaches revealed that patient had Hypoattenuating areas within the inferior left frontal lobe and posterior right parietal lobe are favored to reflect areas of remote infarction on MRI examination of the head.  Neurology  was consulted and based on history recommended that patient obtain holter monitor as outpatient because even though she was NSR while hospitalized if she were having paroxysmal atrial fibrillation this may have predisposed her to the stokes.  Also recommendations were made for better blood pressure control, statin, and the addition of plavix.  Pt is to follow up as indicated  below.  Is to discontinue bystolic and take her ARB and amlodipine for blood pressure control.   No resolved problems to display.  Active Hospital Problems  Diagnoses Date Noted   . Cerebral infarct 04/04/2011     Priority: High  . Bradycardia 04/01/2011   . Hypertension    . Stroke      Resolved Hospital Problems  Diagnoses Date Noted Date Resolved     Disposition and Follow-up: as indicated below, have discussed with patients and they verbalize agreement and understanding.   Will set up physical therapy at home. Discharge Orders    Future Orders Please Complete By Expires   Diet - low sodium heart healthy      Increase activity slowly      Discharge instructions      Comments:   Pt is to stop taking bystolic.    Is to follow up in ~ 1 week with cardiology with Dr Lucienne Minks  for holter monitor given recurrent strokes.  Is to follow up in 1-2 weeks with neurology with Dr Andrey Spearman or his associates.   Call MD for:  temperature >100.4      Call MD for:  persistant nausea and vomiting      Call MD for:  persistant dizziness or light-headedness        Follow-up Information    Follow up with [facility] TOM, MD .          DISCHARGE EXAM:  General: Alert, awake, oriented x3, in no acute distress. HEENT: No bruits, no goiter. Heart: Regular rate and rhythm, without murmurs, rubs, gallops. Lungs: Clear to auscultation bilaterally. Abdomen: Soft, nontender, nondistended, positive bowel sounds. Extremities: No clubbing cyanosis or edema with positive pedal pulses. Neuro: patient answers questions appropriately and has equal strength diffusely    Blood pressure 148/72, pulse 64, temperature 98 F (36.7 C), temperature source Oral, resp. rate 20, height 5\' 3"  (1.6 m), weight 63.186 kg (139 lb 4.8 oz), SpO2 95.00%.  No results found for this basename: NA:2,K:2,CL:2,CO2:2,GLUCOSE:2,BUN:2,CREATININE:2,CALCIUM:2,MG:2,PHOS:2 in the last 72 hours No results found for  this basename: AST:2,ALT:2,ALKPHOS:2,BILITOT:2,PROT:2,ALBUMIN:2 in the last 72 hours No results found for this basename: LIPASE:2,AMYLASE:2 in the last 72 hours No results found for this basename: WBC:2,NEUTROABS:2,HGB:2,HCT:2,MCV:2,PLT:2 in the last 72 hours  Signed: Velvet Bathe M.D. 04/04/2011, 3:44 PM

## 2011-04-04 NOTE — Progress Notes (Addendum)
Subjective: Pt mentions that her initial symptoms was headache.  She reportedly had ablation to treat her atrial fibrillation.  Reports that the day she came to the hospital she was having bad headaches and after she was awoken from her sleep due to the headache she checked her blood pressure and noticed it was elevated reportedly with SBP of 200's.  Decided to come to the ED for further evaluation.   Today denies any headaches.  Objective: Filed Vitals:   04/03/11 1829 04/03/11 2200 04/04/11 0210 04/04/11 0549  BP: 158/74 189/63 159/68 156/69  Pulse: 53 58 55 52  Temp: 98.7 F (37.1 C) 97.7 F (36.5 C) 97.4 F (36.3 C) 97.2 F (36.2 C)  TempSrc: Oral Oral Oral Oral  Resp: 18 18 18 18   Height:      Weight:      SpO2: 97% 98% 99% 100%   Weight change:  No intake or output data in the 24 hours ending 04/04/11 0906  General: Alert, awake, oriented x3, in no acute distress.  HEENT: No bruits, no goiter.  Heart: Regular rate and rhythm, without murmurs, rubs, gallops.  Lungs: Clear to auscultation. Abdomen: Soft, nontender, nondistended, positive bowel sounds.  Neuro: Grossly intact, nonfocal.   Lab Results: No results found for this basename: NA:2,K:2,CL:2,CO2:2,GLUCOSE:2,BUN:2,CREATININE:2,CALCIUM:2,MG:2,PHOS:2 in the last 72 hours No results found for this basename: AST:2,ALT:2,ALKPHOS:2,BILITOT:2,PROT:2,ALBUMIN:2 in the last 72 hours No results found for this basename: LIPASE:2,AMYLASE:2 in the last 72 hours No results found for this basename: WBC:2,NEUTROABS:2,HGB:2,HCT:2,MCV:2,PLT:2 in the last 72 hours No results found for this basename: CKTOTAL:3,CKMB:3,CKMBINDEX:3,TROPONINI:3 in the last 72 hours No components found with this basename: POCBNP:3 No results found for this basename: DDIMER:2 in the last 72 hours No results found for this basename: HGBA1C:2 in the last 72 hours  Basename 04/03/11 0614  CHOL 233*  HDL 73  LDLCALC 143*  TRIG 85  CHOLHDL 3.2  LDLDIRECT --      Basename 04/02/11 0745  TSH 6.682*  T4TOTAL --  T3FREE --  THYROIDAB --   No results found for this basename: VITAMINB12:2,FOLATE:2,FERRITIN:2,TIBC:2,IRON:2,RETICCTPCT:2 in the last 72 hours  Micro Results: No results found for this or any previous visit (from the past 240 hour(s)).  Studies/Results: Mr Virgel Paling [id number] Contrast  04/02/2011  *RADIOLOGY REPORT*  Clinical Data: Right occipital infarct. Acute but ill-defined cerebrovascular disease.  Aphasia.  Previous strokes.  MRA HEAD WITHOUT CONTRAST  Technique: Angiographic images of the Circle of Willis were obtained using MRA technique without intravenous contrast.  Comparison: MRI brain 04/01/2011  Findings: Widely patent carotid and basilar arteries.  Left greater than right vertebral arteries contribute to formation of the basilar.  There is no proximal flow limiting stenosis of the anterior, middle, posterior cerebral arteries. Mild nonstenotic irregularity can be seen in the P1 segment right posterior cerebral artery (image 11 series 306.) There is poor flow related enhancement in the distal most aspect of the right posterior cerebral artery which could be responsible for the observed pattern of infarction, representing intracranial atherosclerotic change. No visible intracranial berry aneurysm.  IMPRESSION: Poor flow related enhancement distal right PCA.  Mild nonstenotic irregularity right P1 segment.  No carotid or basilar stenosis is observed.  Original Report Authenticated By: Staci Righter, M.D.   Mr Brain Wo Contrast  04/02/2011  *RADIOLOGY REPORT*  Clinical Data: Headache.  Sinus bradycardia.  Hypertension. Previous strokes.  MRI HEAD WITHOUT CONTRAST  Technique:  Multiplanar, multiecho pulse sequences of the brain and surrounding structures were obtained according  to standard protocol without intravenous contrast.  Comparison: Most recent CT 04/01/2011.  Previous MRI 09/11/2003.  Findings: Difficulty with completion of the exam led  to a delay in interpretation of the exam before the images reached the appropriate worklist.  There is an acute 1 cm right occipital cortical infarct without hemorrhage (image 11-13 of series 3).  There is no mass lesion or hydrocephalus.  Advanced atrophy is present with chronic microvascular ischemic change.  There are remote left frontal and right parietal infarcts. No midline shift.  Major intracranial vascular structures patent. Unremarkable cerebellar tonsils.  Empty sella.  Cervical spondylosis is incompletely evaluated.  Mild chronic sinus disease. No mastoid fluid.  The acute infarct is seen as an area of hypodensity on prior CT.  IMPRESSION:  Acute 1 cm right occipital cortical infarct without hemorrhage.  No mass lesion or hydrocephalus. Atrophy and small vessel disease. Remote bihemispheric infarcts.  I personally discussed these findings with the ordering physician at the time of interpretation.  Original Report Authenticated By: Staci Righter, M.D.    Medications: I have reviewed the patient's current medications.   Patient Active Hospital Problem List: Cerebral infarct (04/04/2011)  Spoke with neurology and will follow up on their recommendations.  LDL not at goal and is currently 143. Patient is not on any statins.  Will start lipitor today.  Per discussion with Neurology given patient's number of strokes it may be necessary to place patient on coumadin or plavix.  Currently she is NSR and has had an ablation nonetheless given the amount of strokes and their different locations it is possible that she may be having paroxysmal afib.  Agree that patient will require holter monitoring as outpatient.   Will f/u with neurology's recommendations regarding adding plavix to her current regimen.  Hypertension () Not well controlled currently.  Will start on norvasc today last blood pressure reading 156/69  Bradycardia (04/01/2011) Is followed by cardiology.  They did not think that patient  needed pacemaker and her blood pressures have been elevated this admission.  Will have patient f/u with cardiology as outpatient for further monitoring.      LOS: 3 days   Velvet Bathe M.D.  [facility] 04/04/2011, 9:06 AM  Addendum:  Disposition:  Patient has been evaluated by Physical therapy and they have recommended outpatient PT vs none.  If cleared to go home by neurology will discharge today to home with follow-up with her cardiologist.

## 2011-04-04 NOTE — Progress Notes (Signed)
Patient ID: [name], female   DOB: September 05, 1925, 78 y.o.   MRN: [id number] @ Subjective:  Denies SSCP, palpitations or Dyspnea   Objective:  Filed Vitals:   04/04/11 0210 04/04/11 0549 04/04/11 1017 04/04/11 1030  BP: 159/68 156/69 168/72 176/77  Pulse: 55 52  50  Temp: 97.4 F (36.3 C) 97.2 F (36.2 C)  97.6 F (36.4 C)  TempSrc: Oral Oral  Oral  Resp: 18 18  19   Height:      Weight:      SpO2: 99% 100%  93%    Intake/Output from previous day:  Intake/Output Summary (Last 24 hours) at 04/04/11 1246 Last data filed at 04/04/11 1018  Gross per 24 hour  Intake      3 ml  Output      0 ml  Net      3 ml    Physical Exam: General appearance: alert and cooperative Lungs: clear to auscultation bilaterally Heart: regular rate and rhythm, S1, S2 normal, no murmur, click, rub or gallop Abdomen: soft, non-tender; bowel sounds normal; no masses,  no organomegaly Extremities: extremities normal, atraumatic, no cyanosis or edema Pulses: 2+ and symmetric Neurologic: Grossly normal  Lab Results: Basic Metabolic Panel: No results found for this basename: NA:2,K:2,CL:2,CO2:2,GLUCOSE:2,BUN:2,CREATININE:2,CALCIUM:2,MG:2,PHOS:2 in the last 72 hours Liver Function Tests: No results found for this basename: AST:2,ALT:2,ALKPHOS:2,BILITOT:2,PROT:2,ALBUMIN:2 in the last 72 hours No results found for this basename: LIPASE:2,AMYLASE:2 in the last 72 hours CBC: No results found for this basename: WBC:2,NEUTROABS:2,HGB:2,HCT:2,MCV:2,PLT:2 in the last 72 hours Cardiac Enzymes: No results found for this basename: CKTOTAL:3,CKMB:3,CKMBINDEX:3,TROPONINI:3 in the last 72 hours BNP: No components found with this basename: POCBNP:3 D-Dimer: No results found for this basename: DDIMER:2 in the last 72 hours Hemoglobin A1C: No results found for this basename: HGBA1C in the last 72 hours Fasting Lipid Panel:  Basename 04/03/11 0614  CHOL 233*  HDL 73  LDLCALC 143*  TRIG 85  CHOLHDL 3.2    LDLDIRECT --   Thyroid Function Tests:  Basename 04/02/11 0745  TSH 6.682*  T4TOTAL --  T3FREE --  THYROIDAB --   Anemia Panel: No results found for this basename: VITAMINB12,FOLATE,FERRITIN,TIBC,IRON,RETICCTPCT in the last 72 hours  Imaging: Mr Virgel Paling Wo Contrast  04/02/2011  *RADIOLOGY REPORT*  Clinical Data: Right occipital infarct. Acute but ill-defined cerebrovascular disease.  Aphasia.  Previous strokes.  MRA HEAD WITHOUT CONTRAST  Technique: Angiographic images of the Circle of Willis were obtained using MRA technique without intravenous contrast.  Comparison: MRI brain 04/01/2011  Findings: Widely patent carotid and basilar arteries.  Left greater than right vertebral arteries contribute to formation of the basilar.  There is no proximal flow limiting stenosis of the anterior, middle, posterior cerebral arteries. Mild nonstenotic irregularity can be seen in the P1 segment right posterior cerebral artery (image 11 series 306.) There is poor flow related enhancement in the distal most aspect of the right posterior cerebral artery which could be responsible for the observed pattern of infarction, representing intracranial atherosclerotic change. No visible intracranial berry aneurysm.  IMPRESSION: Poor flow related enhancement distal right PCA.  Mild nonstenotic irregularity right P1 segment.  No carotid or basilar stenosis is observed.  Original Report Authenticated By: Staci Righter, M.D.    Cardiac Studies: Echo:  2/7  Normal EF 55-60%    Medications:     . amLODipine  5 mg Oral Daily  . atorvastatin  20 mg Oral q1800  . enoxaparin (LOVENOX) injection  40 mg Subcutaneous Q24H  .  olmesartan  40 mg Oral Daily  . sodium chloride  3 mL Intravenous Q12H  . thyroid  15 mg Oral Daily  . DISCONTD: NON FORMULARY 20 mg  20 mg Oral Daily  . DISCONTD: rosuvastatin  10 mg Oral q1800       Assessment/Plan:  CVA:  Per neurology patient to start Plavix  Allergy to asa.   PAF:   Previous ablation with Ola Spurr.  Previously seen by [facility].  Will arrange 21 day event monitor and F/U Dr Lucienne Minks HTN:  Meds adjusted by primary service.    Dispostion:  Possible D/C today  Jenkins Rouge 04/04/2011, 12:46 PM

## 2011-04-08 ENCOUNTER — Telehealth: Payer: Self-pay | Admitting: Cardiovascular Disease

## 2011-04-08 DIAGNOSIS — J309 Allergic rhinitis, unspecified: Secondary | ICD-10-CM | POA: Diagnosis not present

## 2011-04-08 DIAGNOSIS — Z09 Encounter for follow-up examination after completed treatment for conditions other than malignant neoplasm: Secondary | ICD-10-CM | POA: Diagnosis not present

## 2011-04-08 DIAGNOSIS — I480 Paroxysmal atrial fibrillation: Secondary | ICD-10-CM

## 2011-04-08 DIAGNOSIS — I6350 Cerebral infarction due to unspecified occlusion or stenosis of unspecified cerebral artery: Secondary | ICD-10-CM | POA: Diagnosis not present

## 2011-04-08 DIAGNOSIS — I10 Essential (primary) hypertension: Secondary | ICD-10-CM | POA: Diagnosis not present

## 2011-04-08 NOTE — Telephone Encounter (Signed)
Per message from Dr. Johnsie Cancel pt will need 21 day event monitor for PAF. I spoke with Rod Holler and she can see pt tomorrow when here for appt with Dr. Angelena Form to place monitor.

## 2011-04-08 NOTE — Telephone Encounter (Signed)
Spoke with pt. Norvasc was started in hospital and she thinks she is having problems with it. She states she has trouble with many medicines.  Complains of burning in feet and lower legs.  Not sure what her blood pressure is running.  Appt made for pt to see Dr. Angelena Form tomorrow at 2:00.

## 2011-04-08 NOTE — Telephone Encounter (Signed)
Addended by: Thompson Grayer on: 04/08/2011 11:29 AM   Modules accepted: Orders

## 2011-04-08 NOTE — Telephone Encounter (Signed)
Patient husband Emmitt  Calling  Patient is having some issues with the meds that prescribed while in ER.  Patient was seen by Dr. Angelena Form while in ER, would like to see him again, next avail new patient 04/2011.  Please return call to patient husband at hm# regarding meds and f/u.

## 2011-04-09 ENCOUNTER — Encounter (INDEPENDENT_AMBULATORY_CARE_PROVIDER_SITE_OTHER): Payer: Medicare Other

## 2011-04-09 ENCOUNTER — Ambulatory Visit (INDEPENDENT_AMBULATORY_CARE_PROVIDER_SITE_OTHER): Payer: Medicare Other | Admitting: Cardiovascular Disease

## 2011-04-09 ENCOUNTER — Encounter: Payer: Self-pay | Admitting: Cardiovascular Disease

## 2011-04-09 VITALS — BP 150/67 | HR 58 | Ht 63.0 in | Wt 140.0 lb

## 2011-04-09 DIAGNOSIS — I4891 Unspecified atrial fibrillation: Secondary | ICD-10-CM

## 2011-04-09 DIAGNOSIS — I480 Paroxysmal atrial fibrillation: Secondary | ICD-10-CM

## 2011-04-09 DIAGNOSIS — I498 Other specified cardiac arrhythmias: Secondary | ICD-10-CM | POA: Diagnosis not present

## 2011-04-09 DIAGNOSIS — R001 Bradycardia, unspecified: Secondary | ICD-10-CM

## 2011-04-09 NOTE — Assessment & Plan Note (Signed)
She remains in sinus bradycardia. She has no dizziness, near syncope or syncope. Will arrange 21 day event monitor to see if she is having any episodes of PAF which could explain her CVA. If she has recurrent a. Fib or flutter, will need coumadin. If not, will continue with Plavix only.

## 2011-04-09 NOTE — Patient Instructions (Signed)
Your physician recommends that you schedule a follow-up appointment in: 4 weeks.   Your physician has recommended that you wear an event monitor. Event monitors are medical devices that record the heart's electrical activity. Doctors most often [place] these monitors to diagnose arrhythmias. Arrhythmias are problems with the speed or rhythm of the heartbeat. The monitor is a small, portable device. You can wear one while you do your normal daily activities. This is usually used to diagnose what is causing palpitations/syncope (passing out).

## 2011-04-09 NOTE — Progress Notes (Signed)
History of Present Illness: 78 yo AAF with history of HTN, CVA, PAF who was admitted to [facility] on 04/01/11 with a headache, elevated BP and sinus bradycardia.  She saw Dr. Dennard Schaumann, her family physician about 10 day prior to admission and was started on Bystolic for better blood pressure control. She had been compliant with this medication. She began to have headaches and came into the ED. Head CT was non-specific. I saw her as a Optometrist for bradycardia. Our recommendations were to hold the beta blocker and follow. Brain MRI on 04/02/11 showed acute 1 cm right occipital cortical infarct without hemorrhage. No mass lesion or hydrocephalus. Atrophy and small vessel disease. Remote bihemispheric infarcts. She was seen by Neurology and given her history of remote infarcts and history of PAF in the past before atrial fibrillation, recommendation was made an event monitor to exclude recurrence of atrial fibrillation. She was started on Plavix in the hospital. Her beta blocker was held and an ARB was started. She was started on Norvasc in the hospital but had burning in her legs so she stopped it. Echo 04/02/11 with normal LV size and fucntion.   She has been feeling well overall. No chest pain or SOB. She still has occasional headaches. No palpitations.     Primary Care Physician:  Dr. Dennard Schaumann   Past Medical History  Diagnosis Date  . Hypertension   . Stroke   . Palpitations   . PAF (paroxysmal atrial fibrillation)   . Hypothyroidism   . Migraine     Past Surgical History  Procedure Date  . A flutter ablation     2006    Current Outpatient Prescriptions  Medication Sig Dispense Refill  . clopidogrel (PLAVIX) 75 MG tablet Take 75 mg by mouth daily.      . irbesartan (AVAPRO) 150 MG tablet Take 2 tablets (300 mg total) by mouth daily.  30 tablet  0  . thyroid (ARMOUR) 15 MG tablet Take 15 mg by mouth daily.        Allergies  Allergen Reactions  . Aspirin Cough    History     Social History  . Marital Status: Married    Spouse Name: N/A    Number of Children: N/A  . Years of Education: N/A   Occupational History  . Not on file.   Social History Main Topics  . Smoking status: Never Smoker   . Smokeless tobacco: Never Used  . Alcohol Use: No  . Drug Use: No  . Sexually Active: No   Other Topics Concern  . Not on file   Social History Narrative  . No narrative on file    Family History  Problem Relation Age of Onset  . Hypertension Mother     Review of Systems:  As stated in the HPI and otherwise negative.   BP 150/67  Pulse 58  Ht 5\' 3"  (1.6 m)  Wt 140 lb (63.504 kg)  BMI 24.80 kg/m2  Physical Examination: General: Well developed, well nourished, NAD HEENT: OP clear, mucus membranes moist SKIN: warm, dry. No rashes. Neuro: No focal deficits Musculoskeletal: Muscle strength 5/5 all ext Psychiatric: Mood and affect normal Neck: No JVD, no carotid bruits, no thyromegaly, no lymphadenopathy. Lungs:Clear bilaterally, no wheezes, rhonci, crackles Cardiovascular: Regular rate and rhythm. No murmurs, gallops or rubs. Abdomen:Soft. Bowel sounds present. Non-tender.  Extremities: No lower extremity edema. Pulses are 2 + in the bilateral DP/PT.  EKG: Sinus brady, rate 59 bpm. RBBB.  Non-specific ST and T wave changes.   MRI Brain 04/02/11: Acute 1 cm right occipital cortical infarct without hemorrhage. No mass lesion or hydrocephalus. Atrophy and small vessel disease. Remote bihemispheric infarcts.   Echo 04/02/11: Left ventricle: The cavity size was normal. Systolic function was normal. The estimated ejection fraction was in the range of 55% to 60%. Wall motion was normal; there were no regional wall motion abnormalities.

## 2011-04-10 ENCOUNTER — Telehealth: Payer: Self-pay | Admitting: Cardiovascular Disease

## 2011-04-10 NOTE — Telephone Encounter (Signed)
Advised patient, verbalized understanding  

## 2011-04-10 NOTE — Telephone Encounter (Signed)
Spoke with patient and her blood pressure last night up to 220/? and she took an extra 1/2 Norvasc 5 mg. This am blood pressure down to 158/80's.  Takes her Avapro 150 mg bid instead of 300 mg daily secondary to her blood pressure going up in evenings.  Will use Norvasc 5 mg 1/2 daily as needed for elevated blood pressure until Dr Angelena Form reviews.  Advised to call if any problems before call back from our office.

## 2011-04-10 NOTE — Telephone Encounter (Signed)
I agree that she should go back on the Norvasc at 2.5 mg daily and then call her primary care doctor for further management of her blood pressure medications. cdm

## 2011-04-10 NOTE — Telephone Encounter (Signed)
New Problem:    Patient called in because she was taken off of her Norvast because she was experiencing a burning sensation in her legs and was told to only take her irbesartan (AVAPRO) 150 MG tablet at night.  Last night her blood pressure spiked to over 200 and she was wondering if she would be able to take her medication later or take a half pill more during the day to regulate her bp. Please call back.

## 2011-04-20 ENCOUNTER — Ambulatory Visit (HOSPITAL_COMMUNITY): Admission: RE | Admit: 2011-04-20 | Payer: Medicare Other | Source: Ambulatory Visit

## 2011-04-22 DIAGNOSIS — E782 Mixed hyperlipidemia: Secondary | ICD-10-CM | POA: Diagnosis not present

## 2011-04-22 DIAGNOSIS — I6350 Cerebral infarction due to unspecified occlusion or stenosis of unspecified cerebral artery: Secondary | ICD-10-CM | POA: Diagnosis not present

## 2011-04-22 DIAGNOSIS — I10 Essential (primary) hypertension: Secondary | ICD-10-CM | POA: Diagnosis not present

## 2011-04-22 DIAGNOSIS — I4891 Unspecified atrial fibrillation: Secondary | ICD-10-CM | POA: Diagnosis not present

## 2011-04-29 DIAGNOSIS — J309 Allergic rhinitis, unspecified: Secondary | ICD-10-CM | POA: Diagnosis not present

## 2011-05-06 DIAGNOSIS — I4891 Unspecified atrial fibrillation: Secondary | ICD-10-CM | POA: Diagnosis not present

## 2011-05-06 DIAGNOSIS — I10 Essential (primary) hypertension: Secondary | ICD-10-CM | POA: Diagnosis not present

## 2011-05-06 DIAGNOSIS — E782 Mixed hyperlipidemia: Secondary | ICD-10-CM | POA: Diagnosis not present

## 2011-05-06 DIAGNOSIS — I119 Hypertensive heart disease without heart failure: Secondary | ICD-10-CM | POA: Diagnosis not present

## 2011-05-11 DIAGNOSIS — J45909 Unspecified asthma, uncomplicated: Secondary | ICD-10-CM | POA: Diagnosis not present

## 2011-05-11 DIAGNOSIS — K219 Gastro-esophageal reflux disease without esophagitis: Secondary | ICD-10-CM | POA: Diagnosis not present

## 2011-05-11 DIAGNOSIS — I10 Essential (primary) hypertension: Secondary | ICD-10-CM | POA: Diagnosis not present

## 2011-05-11 DIAGNOSIS — Z889 Allergy status to unspecified drugs, medicaments and biological substances status: Secondary | ICD-10-CM | POA: Diagnosis not present

## 2011-05-20 ENCOUNTER — Ambulatory Visit (INDEPENDENT_AMBULATORY_CARE_PROVIDER_SITE_OTHER): Payer: Medicare Other | Admitting: Cardiovascular Disease

## 2011-05-20 ENCOUNTER — Encounter: Payer: Self-pay | Admitting: Cardiovascular Disease

## 2011-05-20 VITALS — BP 171/74 | HR 64 | Wt 136.0 lb

## 2011-05-20 DIAGNOSIS — I491 Atrial premature depolarization: Secondary | ICD-10-CM | POA: Diagnosis not present

## 2011-05-20 DIAGNOSIS — I6350 Cerebral infarction due to unspecified occlusion or stenosis of unspecified cerebral artery: Secondary | ICD-10-CM

## 2011-05-20 DIAGNOSIS — I10 Essential (primary) hypertension: Secondary | ICD-10-CM | POA: Diagnosis not present

## 2011-05-20 DIAGNOSIS — I639 Cerebral infarction, unspecified: Secondary | ICD-10-CM

## 2011-05-20 NOTE — Progress Notes (Signed)
History of Present Illness: 78 yo AAF with history of HTN, CVA, PAF who was admitted to [facility] on 04/01/11 with a headache, elevated BP and sinus bradycardia. She saw Dr. Dennard Schaumann, her family physician about 10 day prior to admission and was started on Bystolic for better blood pressure control. She had been compliant with this medication. She began to have headaches and came into the ED. Head CT was non-specific. I saw her as a Optometrist for bradycardia. Our recommendations were to hold the beta blocker and follow. Brain MRI on 04/02/11 showed acute 1 cm right occipital cortical infarct without hemorrhage. No mass lesion or hydrocephalus. Atrophy and small vessel disease. Remote bihemispheric infarcts. She was seen by Neurology and given her history of remote infarcts and history of PAF in the past before admission, recommendation was made an event monitor to exclude recurrence of atrial fibrillation. She was started on Plavix in the hospital. Her beta blocker was held and an ARB was started. She was started on Norvasc in the hospital but had burning in her legs so she stopped it. Echo 04/02/11 with normal LV size and fucntion. I saw her here in the office 6 weeks ago and ordered an event monitor to exclude PAF.   She is here today for follow up. She has been feeling well overall. No chest pain or SOB.  No palpitations. Event monitor showed NSR with PACs.No evidence of atrial fib.   Primary Care Physician: Toney Reil in [facility].   Past Medical History  Diagnosis Date  . Hypertension   . Stroke   . Palpitations   . PAF (paroxysmal atrial fibrillation)   . Hypothyroidism   . Migraine     Past Surgical History  Procedure Date  . A flutter ablation     2006    Current Outpatient Prescriptions  Medication Sig Dispense Refill  . clopidogrel (PLAVIX) 75 MG tablet Take 75 mg by mouth daily.      . irbesartan (AVAPRO) 150 MG tablet 1 1/2 TAB PO QD      . Omega-3 Fatty Acids  (OMEGA 3 PO) Take 1 tablet by mouth daily.      [name] thyroid (ARMOUR) 15 MG tablet Take 15 mg by mouth daily.        Allergies  Allergen Reactions  . Aceon (Perindopril Erbumine)     cough  . Aspirin Cough    History   Social History  . Marital Status: Married    Spouse Name: N/A    Number of Children: N/A  . Years of Education: N/A   Occupational History  . Not on file.   Social History Main Topics  . Smoking status: Never Smoker   . Smokeless tobacco: Never Used  . Alcohol Use: No  . Drug Use: No  . Sexually Active: No   Other Topics Concern  . Not on file   Social History Narrative  . No narrative on file    Family History  Problem Relation Age of Onset  . Hypertension Mother     Review of Systems:  As stated in the HPI and otherwise negative.   BP 171/74  Pulse 64  Wt 136 lb (61.689 kg)  Physical Examination: General: Well developed, well nourished, NAD HEENT: OP clear, mucus membranes moist SKIN: warm, dry. No rashes. Neuro: No focal deficits Musculoskeletal: Muscle strength 5/5 all ext Psychiatric: Mood and affect normal Neck: No JVD, no carotid bruits, no thyromegaly, no lymphadenopathy. Lungs:Clear bilaterally,  no wheezes, rhonci, crackles Cardiovascular: Irregular rhythm. No murmurs, gallops or rubs. Abdomen:Soft. Bowel sounds present. Non-tender.  Extremities: No lower extremity edema. Pulses are 2 + in the bilateral DP/PT.  Event Monitor: NSR, PACs.   EKG: NSR with sinus arrytmia. RBBB

## 2011-05-20 NOTE — Patient Instructions (Signed)
Your physician wants you to follow-up in:  6 months. You will receive a reminder letter in the mail two months in advance. If you don't receive a letter, please call our office to schedule the follow-up appointment.   

## 2011-05-20 NOTE — Assessment & Plan Note (Signed)
No changes

## 2011-05-20 NOTE — Assessment & Plan Note (Signed)
Has been well controlled at home. She has not taken her am meds today. She will follow in primary care.

## 2011-05-20 NOTE — Assessment & Plan Note (Signed)
No evidence of atrial fibrillation on event monitor. She is on Plavix for her recent CVA. No indication now for coumadin.

## 2011-05-25 DIAGNOSIS — E559 Vitamin D deficiency, unspecified: Secondary | ICD-10-CM | POA: Diagnosis not present

## 2011-05-25 DIAGNOSIS — I10 Essential (primary) hypertension: Secondary | ICD-10-CM | POA: Diagnosis not present

## 2011-05-25 DIAGNOSIS — E039 Hypothyroidism, unspecified: Secondary | ICD-10-CM | POA: Diagnosis not present

## 2011-05-25 DIAGNOSIS — E782 Mixed hyperlipidemia: Secondary | ICD-10-CM | POA: Diagnosis not present

## 2011-07-02 DIAGNOSIS — H40019 Open angle with borderline findings, low risk, unspecified eye: Secondary | ICD-10-CM | POA: Diagnosis not present

## 2011-07-21 DIAGNOSIS — N8182 Incompetence or weakening of pubocervical tissue: Secondary | ICD-10-CM | POA: Diagnosis not present

## 2011-07-30 DIAGNOSIS — H4011X0 Primary open-angle glaucoma, stage unspecified: Secondary | ICD-10-CM | POA: Diagnosis not present

## 2011-08-25 DIAGNOSIS — Z0000 Encounter for general adult medical examination without abnormal findings: Secondary | ICD-10-CM | POA: Diagnosis not present

## 2011-08-25 DIAGNOSIS — E039 Hypothyroidism, unspecified: Secondary | ICD-10-CM | POA: Diagnosis not present

## 2011-08-25 DIAGNOSIS — E559 Vitamin D deficiency, unspecified: Secondary | ICD-10-CM | POA: Diagnosis not present

## 2011-08-25 DIAGNOSIS — E782 Mixed hyperlipidemia: Secondary | ICD-10-CM | POA: Diagnosis not present

## 2011-08-25 DIAGNOSIS — I10 Essential (primary) hypertension: Secondary | ICD-10-CM | POA: Diagnosis not present

## 2011-09-08 DIAGNOSIS — E559 Vitamin D deficiency, unspecified: Secondary | ICD-10-CM | POA: Diagnosis not present

## 2011-09-08 DIAGNOSIS — E782 Mixed hyperlipidemia: Secondary | ICD-10-CM | POA: Diagnosis not present

## 2011-09-08 DIAGNOSIS — E039 Hypothyroidism, unspecified: Secondary | ICD-10-CM | POA: Diagnosis not present

## 2011-09-08 DIAGNOSIS — I10 Essential (primary) hypertension: Secondary | ICD-10-CM | POA: Diagnosis not present

## 2011-09-10 DIAGNOSIS — L910 Hypertrophic scar: Secondary | ICD-10-CM | POA: Diagnosis not present

## 2011-09-14 DIAGNOSIS — R300 Dysuria: Secondary | ICD-10-CM | POA: Diagnosis not present

## 2011-10-14 DIAGNOSIS — L910 Hypertrophic scar: Secondary | ICD-10-CM | POA: Diagnosis not present

## 2011-10-29 DIAGNOSIS — H4011X0 Primary open-angle glaucoma, stage unspecified: Secondary | ICD-10-CM | POA: Diagnosis not present

## 2011-11-20 DIAGNOSIS — R05 Cough: Secondary | ICD-10-CM | POA: Diagnosis not present

## 2011-11-26 ENCOUNTER — Ambulatory Visit (INDEPENDENT_AMBULATORY_CARE_PROVIDER_SITE_OTHER): Payer: Medicare Other | Admitting: Cardiovascular Disease

## 2011-11-26 ENCOUNTER — Encounter: Payer: Self-pay | Admitting: Cardiovascular Disease

## 2011-11-26 VITALS — BP 154/80 | HR 86 | Ht 63.0 in | Wt 131.0 lb

## 2011-11-26 DIAGNOSIS — I491 Atrial premature depolarization: Secondary | ICD-10-CM

## 2011-11-26 DIAGNOSIS — I10 Essential (primary) hypertension: Secondary | ICD-10-CM

## 2011-11-26 MED ORDER — HYDROCHLOROTHIAZIDE 25 MG PO TABS: 25.0000 mg | ORAL_TABLET | Freq: Every day | ORAL | Status: DC

## 2011-11-26 NOTE — Progress Notes (Signed)
History of Present Illness: 78 yo AAF with history of HTN, CVA, PAF s/p MAZE 2006 who was admitted to [facility] on 04/01/11 with a headache, elevated BP and sinus bradycardia. She saw her family physician about 10 day prior to admission and was started on Bystolic for better blood pressure control. She had been compliant with this medication. She began to have headaches and came into the ED. Head CT was non-specific. I saw her as a Optometrist for bradycardia. Our recommendations were to hold the beta blocker and follow. Brain MRI on 04/02/11 showed acute 1 cm right occipital cortical infarct without hemorrhage. No mass lesion or hydrocephalus. Atrophy and small vessel disease. Remote bihemispheric infarcts. She was seen by Neurology and given her history of remote infarcts and history of PAF in the past before admission, recommendation was made an event monitor to exclude recurrence of atrial fibrillation. She was started on Plavix in the hospital. Her beta blocker was held and an ARB was started. She was started on Norvasc in the hospital but had burning in her legs so she stopped it. Echo 04/02/11 with normal LV size and fucntion. I saw her here in the office March 2013 and ordered an event monitor to exclude PAF.  Event monitor showed NSR with PACs.No evidence of atrial fib.   She is here today for follow up. She has been feeling well overall. No chest pain or SOB. No palpitations. Her BP has been A999333 systolically at home.   Primary Care Physician: Toney Reil in [facility].   Past Medical History  Diagnosis Date  . Hypertension   . Stroke   . Palpitations   . PAF (paroxysmal atrial fibrillation)   . Hypothyroidism   . Migraine     Past Surgical History  Procedure Date  . Maze     2006    Current Outpatient Prescriptions  Medication Sig Dispense Refill  . clopidogrel (PLAVIX) 75 MG tablet Take 75 mg by mouth daily.      [name] DEXILANT 60 MG capsule 1 tab daily for cough        . irbesartan (AVAPRO) 150 MG tablet 1 1/2 TAB PO QD      . Omega-3 Fatty Acids (OMEGA 3 PO) Take 1 tablet by mouth daily.      [name] thyroid (ARMOUR) 15 MG tablet Take 15 mg by mouth daily.      . TRAVATAN Z 0.004 % SOLN ophthalmic solution As directed        Allergies  Allergen Reactions  . Aceon (Perindopril Erbumine)     cough  . Aspirin Cough    History   Social History  . Marital Status: Married    Spouse Name: N/A    Number of Children: N/A  . Years of Education: N/A   Occupational History  . Not on file.   Social History Main Topics  . Smoking status: Never Smoker   . Smokeless tobacco: Never Used  . Alcohol Use: No  . Drug Use: No  . Sexually Active: No   Other Topics Concern  . Not on file   Social History Narrative  . No narrative on file    Family History  Problem Relation Age of Onset  . Hypertension Mother     Review of Systems:  As stated in the HPI and otherwise negative.   BP 154/80  Pulse 86  Ht 5\' 3"  (1.6 m)  Wt 131 lb (59.421 kg)  BMI 23.21 kg/m2  Physical Examination: General: Well developed, well nourished, NAD HEENT: OP clear, mucus membranes moist SKIN: warm, dry. No rashes. Neuro: No focal deficits Musculoskeletal: Muscle strength 5/5 all ext Psychiatric: Mood and affect normal Neck: No JVD, no carotid bruits, no thyromegaly, no lymphadenopathy. Lungs:Clear bilaterally, no wheezes, rhonci, crackles Cardiovascular: Regular rate and rhythm. No murmurs, gallops or rubs. Abdomen:Soft. Bowel sounds present. Non-tender.  Extremities: No lower extremity edema. Pulses are 2 + in the bilateral DP/PT.   Assessment and Plan:   1. CVA: No evidence of atrial fibrillation on event monitor March 2013. She is on Plavix for CVA. No indication now for coumadin.   2. Premature atrial contraction: No symptoms. No changes.   3. HTN: Has been elevated at home. Will add HCTZ 25 mg po Qdaily and will continue Avapro 150 mg po BID. Will check renal  artery dopplers to exclude Renal artery stenosis.

## 2011-11-26 NOTE — Patient Instructions (Signed)
Your physician wants you to follow-up in:  6 months. You will receive a reminder letter in the mail two months in advance. If you don't receive a letter, please call our office to schedule the follow-up appointment.  Your physician has requested that you have a renal artery duplex. During this test, an ultrasound is used to evaluate blood flow to the kidneys. Allow one hour for this exam. Do not eat after midnight the day before and avoid carbonated beverages. Take your medications as you usually do.   Your physician has recommended you make the following change in your medication:  Start hydrochlorothiazide 25 mg by mouth daily.

## 2011-12-01 DIAGNOSIS — J310 Chronic rhinitis: Secondary | ICD-10-CM | POA: Diagnosis not present

## 2011-12-01 DIAGNOSIS — K219 Gastro-esophageal reflux disease without esophagitis: Secondary | ICD-10-CM | POA: Diagnosis not present

## 2011-12-01 DIAGNOSIS — R05 Cough: Secondary | ICD-10-CM | POA: Diagnosis not present

## 2011-12-21 DIAGNOSIS — I10 Essential (primary) hypertension: Secondary | ICD-10-CM | POA: Diagnosis not present

## 2011-12-21 DIAGNOSIS — R05 Cough: Secondary | ICD-10-CM | POA: Diagnosis not present

## 2011-12-21 DIAGNOSIS — I4891 Unspecified atrial fibrillation: Secondary | ICD-10-CM | POA: Diagnosis not present

## 2011-12-21 DIAGNOSIS — K219 Gastro-esophageal reflux disease without esophagitis: Secondary | ICD-10-CM | POA: Diagnosis not present

## 2011-12-30 ENCOUNTER — Encounter (INDEPENDENT_AMBULATORY_CARE_PROVIDER_SITE_OTHER): Payer: Medicare Other

## 2011-12-30 DIAGNOSIS — I10 Essential (primary) hypertension: Secondary | ICD-10-CM

## 2012-01-06 ENCOUNTER — Other Ambulatory Visit: Payer: Self-pay | Admitting: Cardiology

## 2012-01-06 ENCOUNTER — Telehealth: Payer: Self-pay | Admitting: Cardiovascular Disease

## 2012-01-06 DIAGNOSIS — Z1231 Encounter for screening mammogram for malignant neoplasm of breast: Secondary | ICD-10-CM

## 2012-01-06 NOTE — Telephone Encounter (Signed)
Spoke with pt and reviewed renal artery doppler results with her.

## 2012-01-06 NOTE — Telephone Encounter (Signed)
Pt rtn call to pat

## 2012-01-14 DIAGNOSIS — N9489 Other specified conditions associated with female genital organs and menstrual cycle: Secondary | ICD-10-CM | POA: Diagnosis not present

## 2012-01-28 DIAGNOSIS — H4011X0 Primary open-angle glaucoma, stage unspecified: Secondary | ICD-10-CM | POA: Diagnosis not present

## 2012-02-26 ENCOUNTER — Ambulatory Visit
Admission: RE | Admit: 2012-02-26 | Discharge: 2012-02-26 | Disposition: A | Discharge status: Home / Self Care | Payer: Medicare Other | Source: Ambulatory Visit | Attending: Cardiology | Admitting: Cardiology

## 2012-02-26 DIAGNOSIS — Z1231 Encounter for screening mammogram for malignant neoplasm of breast: Secondary | ICD-10-CM

## 2012-03-17 DIAGNOSIS — H348392 Tributary (branch) retinal vein occlusion, unspecified eye, stable: Secondary | ICD-10-CM | POA: Diagnosis not present

## 2012-03-21 DIAGNOSIS — H3560 Retinal hemorrhage, unspecified eye: Secondary | ICD-10-CM | POA: Diagnosis not present

## 2012-03-21 DIAGNOSIS — H35039 Hypertensive retinopathy, unspecified eye: Secondary | ICD-10-CM | POA: Diagnosis not present

## 2012-03-21 DIAGNOSIS — H4310 Vitreous hemorrhage, unspecified eye: Secondary | ICD-10-CM | POA: Diagnosis not present

## 2012-03-21 DIAGNOSIS — H4011X0 Primary open-angle glaucoma, stage unspecified: Secondary | ICD-10-CM | POA: Diagnosis not present

## 2012-03-29 DIAGNOSIS — H2510 Age-related nuclear cataract, unspecified eye: Secondary | ICD-10-CM | POA: Diagnosis not present

## 2012-04-21 DIAGNOSIS — H3560 Retinal hemorrhage, unspecified eye: Secondary | ICD-10-CM | POA: Diagnosis not present

## 2012-05-11 DIAGNOSIS — J069 Acute upper respiratory infection, unspecified: Secondary | ICD-10-CM | POA: Diagnosis not present

## 2012-05-12 DIAGNOSIS — H4011X0 Primary open-angle glaucoma, stage unspecified: Secondary | ICD-10-CM | POA: Diagnosis not present

## 2012-05-26 ENCOUNTER — Ambulatory Visit (INDEPENDENT_AMBULATORY_CARE_PROVIDER_SITE_OTHER): Payer: Medicare Other | Admitting: Cardiovascular Disease

## 2012-05-26 ENCOUNTER — Encounter: Payer: Self-pay | Admitting: Cardiovascular Disease

## 2012-05-26 VITALS — BP 162/80 | HR 55 | Ht 63.5 in | Wt 133.0 lb

## 2012-05-26 DIAGNOSIS — I10 Essential (primary) hypertension: Secondary | ICD-10-CM | POA: Diagnosis not present

## 2012-05-26 DIAGNOSIS — I491 Atrial premature depolarization: Secondary | ICD-10-CM

## 2012-05-26 MED ORDER — HYDROCHLOROTHIAZIDE 25 MG PO TABS: 25.0000 mg | ORAL_TABLET | Freq: Every day | ORAL | Status: DC

## 2012-05-26 NOTE — Patient Instructions (Signed)
Your physician wants you to follow-up in:  12 months.  You will receive a reminder letter in the mail two months in advance. If you don't receive a letter, please call our office to schedule the follow-up appointment.   

## 2012-05-26 NOTE — Progress Notes (Signed)
History of Present Illness: 79 yo AAF with history of HTN, CVA, PAF s/p MAZE 2006 who was admitted to [facility] on 04/01/11 with a headache, elevated BP and sinus bradycardia. She saw her family physician about 10 day prior to admission and was started on Bystolic for better blood pressure control. She had been compliant with this medication. She began to have headaches and came into the ED. Head CT was non-specific. I saw her as a Optometrist for bradycardia. Our recommendations were to hold the beta blocker and follow. Brain MRI on 04/02/11 showed acute 1 cm right occipital cortical infarct without hemorrhage. No mass lesion or hydrocephalus. Atrophy and small vessel disease. Remote bihemispheric infarcts. She was seen by Neurology and given her history of remote infarcts and history of PAF in the past before admission, recommendation was made an event monitor to exclude recurrence of atrial fibrillation. She was started on Plavix in the hospital. Her beta blocker was held and an ARB was started. She was started on Norvasc in the hospital but had burning in her legs so she stopped it. Echo 04/02/11 with normal LV size and fucntion. I saw her here in the office March 2013 and ordered an event monitor to exclude PAF. Event monitor showed NSR with PACs.No evidence of atrial fib. She was last seen here in the office in October 2013. Renal artery dopplers November 2013. I started HCTZ in October.   She is here today for follow up. She has been feeling well overall. No chest pain or SOB. No palpitations. Her BP has been [id number] systolically at home. She stopped taking her HCTZ.   Primary Care Physician: Toney Reil in [facility].  Last Lipid Profile:Lipid Panel     Component Value Date/Time   CHOL 233* 04/03/2011 0614   TRIG 85 04/03/2011 0614   HDL 73 04/03/2011 0614   CHOLHDL 3.2 04/03/2011 0614   VLDL 17 04/03/2011 0614   LDLCALC 143* 04/03/2011 [id number]     Past Medical History  Diagnosis Date  .  Hypertension   . Stroke   . Palpitations   . PAF (paroxysmal atrial fibrillation)   . Hypothyroidism   . Migraine     Past Surgical History  Procedure Laterality Date  . Maze      2006    Current Outpatient Prescriptions  Medication Sig Dispense Refill  . clopidogrel (PLAVIX) 75 MG tablet Take 75 mg by mouth daily.      . irbesartan (AVAPRO) 150 MG tablet 150 mg 2 (two) times daily. 1 1/2 TAB PO QD      . Omega-3 Fatty Acids (OMEGA 3 PO) Take 1 tablet by mouth daily.      [name] thyroid (ARMOUR) 15 MG tablet Take 15 mg by mouth daily.      . TRAVATAN Z 0.004 % SOLN ophthalmic solution As directed       No current facility-administered medications for this visit.    Allergies  Allergen Reactions  . Aceon (Perindopril Erbumine)     cough  . Aspirin Cough    History   Social History  . Marital Status: Married    Spouse Name: N/A    Number of Children: N/A  . Years of Education: N/A   Occupational History  . Not on file.   Social History Main Topics  . Smoking status: Never Smoker   . Smokeless tobacco: Never Used  . Alcohol Use: No  . Drug Use: No  . Sexually Active:  No   Other Topics Concern  . Not on file   Social History Narrative  . No narrative on file    Family History  Problem Relation Age of Onset  . Hypertension Mother     Review of Systems:  As stated in the HPI and otherwise negative.   BP 162/80  Pulse 55  Ht 5' 3.5" (1.613 m)  Wt 133 lb (60.328 kg)  BMI 23.19 kg/m2  SpO2 99%  Physical Examination: General: Well developed, well nourished, NAD HEENT: OP clear, mucus membranes moist SKIN: warm, dry. No rashes. Neuro: No focal deficits Musculoskeletal: Muscle strength 5/5 all ext Psychiatric: Mood and affect normal Neck: No JVD, no carotid bruits, no thyromegaly, no lymphadenopathy. Lungs:Clear bilaterally, no wheezes, rhonci, crackles Cardiovascular: Regular rate and rhythm. No murmurs, gallops or rubs. Abdomen:Soft. Bowel sounds  present. Non-tender.  Extremities: No lower extremity edema. Pulses are 2 + in the bilateral DP/PT.  EKG: Sinus brady, rate 55 bpm. Incomplete RBBB. Diffuse T wave inversions.   Assessment and Plan:   1. CVA: No evidence of atrial fibrillation on event monitor March 2013. She is on Plavix for CVA. No indication now for coumadin.   2. Premature atrial contraction: No symptoms. No changes.   3. HTN: BP has been A999333 at home systolically. She did not start HCTZ as advised at last appt. Will give prescription today and she will consider starting.  Will continue Avapro 150 mg po BID.

## 2012-07-26 DIAGNOSIS — R7309 Other abnormal glucose: Secondary | ICD-10-CM | POA: Diagnosis not present

## 2012-07-26 DIAGNOSIS — E559 Vitamin D deficiency, unspecified: Secondary | ICD-10-CM | POA: Diagnosis not present

## 2012-07-26 DIAGNOSIS — I10 Essential (primary) hypertension: Secondary | ICD-10-CM | POA: Diagnosis not present

## 2012-07-26 DIAGNOSIS — E039 Hypothyroidism, unspecified: Secondary | ICD-10-CM | POA: Diagnosis not present

## 2012-07-26 DIAGNOSIS — E782 Mixed hyperlipidemia: Secondary | ICD-10-CM | POA: Diagnosis not present

## 2012-08-01 DIAGNOSIS — H3560 Retinal hemorrhage, unspecified eye: Secondary | ICD-10-CM | POA: Diagnosis not present

## 2012-08-01 DIAGNOSIS — H4310 Vitreous hemorrhage, unspecified eye: Secondary | ICD-10-CM | POA: Diagnosis not present

## 2012-08-01 DIAGNOSIS — H35039 Hypertensive retinopathy, unspecified eye: Secondary | ICD-10-CM | POA: Diagnosis not present

## 2012-08-01 DIAGNOSIS — H4011X0 Primary open-angle glaucoma, stage unspecified: Secondary | ICD-10-CM | POA: Diagnosis not present

## 2012-08-02 DIAGNOSIS — N83209 Unspecified ovarian cyst, unspecified side: Secondary | ICD-10-CM | POA: Diagnosis not present

## 2012-08-02 DIAGNOSIS — N8189 Other female genital prolapse: Secondary | ICD-10-CM | POA: Diagnosis not present

## 2012-08-11 DIAGNOSIS — I119 Hypertensive heart disease without heart failure: Secondary | ICD-10-CM | POA: Diagnosis not present

## 2012-08-16 DIAGNOSIS — H4011X0 Primary open-angle glaucoma, stage unspecified: Secondary | ICD-10-CM | POA: Diagnosis not present

## 2012-08-18 DIAGNOSIS — E782 Mixed hyperlipidemia: Secondary | ICD-10-CM | POA: Diagnosis not present

## 2012-08-18 DIAGNOSIS — I10 Essential (primary) hypertension: Secondary | ICD-10-CM | POA: Diagnosis not present

## 2012-08-18 DIAGNOSIS — E039 Hypothyroidism, unspecified: Secondary | ICD-10-CM | POA: Diagnosis not present

## 2012-08-18 DIAGNOSIS — R7309 Other abnormal glucose: Secondary | ICD-10-CM | POA: Diagnosis not present

## 2012-11-09 DIAGNOSIS — H2510 Age-related nuclear cataract, unspecified eye: Secondary | ICD-10-CM | POA: Diagnosis not present

## 2012-11-09 DIAGNOSIS — H18419 Arcus senilis, unspecified eye: Secondary | ICD-10-CM | POA: Diagnosis not present

## 2012-11-09 DIAGNOSIS — H4010X0 Unspecified open-angle glaucoma, stage unspecified: Secondary | ICD-10-CM | POA: Diagnosis not present

## 2012-11-09 DIAGNOSIS — H02839 Dermatochalasis of unspecified eye, unspecified eyelid: Secondary | ICD-10-CM | POA: Diagnosis not present

## 2012-11-09 DIAGNOSIS — H25019 Cortical age-related cataract, unspecified eye: Secondary | ICD-10-CM | POA: Diagnosis not present

## 2012-11-10 ENCOUNTER — Telehealth: Payer: Self-pay | Admitting: *Deleted

## 2012-11-10 NOTE — Telephone Encounter (Signed)
Spoke with pt. She thinks she is doing OK but still having palpitations. Felt them earlier today. I told her I would make Dr. Angelena Form aware and see if OK to proceed with surgery. Pt aware he may not be able to address until Monday.

## 2012-11-10 NOTE — Telephone Encounter (Signed)
Fax received in office this afternoon from [place]. They are requesting medical clearance for cataract surgery. Surgery will be done under local anesthesia with IV sedation and is scheduled for November 16, 2012.  Note indicates clearance is needed to address history of medical strokes. Dr. Angelena Form will not be back in office until November 15, 2012. Will forward this phone note to see if pt can be cleared.

## 2012-11-10 NOTE — Telephone Encounter (Signed)
She can proceed with planned eye procedure if she is feeling well. [name]

## 2012-11-14 NOTE — Telephone Encounter (Signed)
She can proceed with surgery. Can we let her know? Thanks, chris

## 2012-11-14 NOTE — Telephone Encounter (Signed)
I spoke with the pt and made her aware that she can proceed with scheduled eye surgery.

## 2012-11-14 NOTE — Telephone Encounter (Signed)
Left message for pt to contact the office.

## 2012-11-15 NOTE — Telephone Encounter (Signed)
Paperwork completed by Dr. Angelena Form. Given to medical records to fax to [facility]

## 2012-12-27 DIAGNOSIS — H269 Unspecified cataract: Secondary | ICD-10-CM | POA: Diagnosis not present

## 2012-12-27 DIAGNOSIS — H2510 Age-related nuclear cataract, unspecified eye: Secondary | ICD-10-CM | POA: Diagnosis not present

## 2013-01-04 DIAGNOSIS — H4011X0 Primary open-angle glaucoma, stage unspecified: Secondary | ICD-10-CM | POA: Diagnosis not present

## 2013-02-28 ENCOUNTER — Emergency Department (HOSPITAL_COMMUNITY)
Admission: EM | Admit: 2013-02-28 | Discharge: 2013-02-28 | Disposition: A | Discharge status: Home / Self Care | Payer: Medicare Other | Attending: Emergency Medicine | Admitting: Emergency Medicine

## 2013-02-28 ENCOUNTER — Encounter (HOSPITAL_COMMUNITY): Payer: Self-pay | Admitting: Radiology

## 2013-02-28 ENCOUNTER — Emergency Department (HOSPITAL_COMMUNITY): Payer: Medicare Other

## 2013-02-28 DIAGNOSIS — R6889 Other general symptoms and signs: Secondary | ICD-10-CM | POA: Diagnosis not present

## 2013-02-28 DIAGNOSIS — E039 Hypothyroidism, unspecified: Secondary | ICD-10-CM | POA: Diagnosis not present

## 2013-02-28 DIAGNOSIS — Z8673 Personal history of transient ischemic attack (TIA), and cerebral infarction without residual deficits: Secondary | ICD-10-CM | POA: Diagnosis not present

## 2013-02-28 DIAGNOSIS — Z9849 Cataract extraction status, unspecified eye: Secondary | ICD-10-CM | POA: Diagnosis not present

## 2013-02-28 DIAGNOSIS — R51 Headache: Secondary | ICD-10-CM | POA: Insufficient documentation

## 2013-02-28 DIAGNOSIS — Z7902 Long term (current) use of antithrombotics/antiplatelets: Secondary | ICD-10-CM | POA: Diagnosis not present

## 2013-02-28 DIAGNOSIS — H5789 Other specified disorders of eye and adnexa: Secondary | ICD-10-CM | POA: Diagnosis not present

## 2013-02-28 DIAGNOSIS — I4891 Unspecified atrial fibrillation: Secondary | ICD-10-CM | POA: Insufficient documentation

## 2013-02-28 DIAGNOSIS — I10 Essential (primary) hypertension: Secondary | ICD-10-CM | POA: Insufficient documentation

## 2013-02-28 DIAGNOSIS — H5710 Ocular pain, unspecified eye: Secondary | ICD-10-CM | POA: Diagnosis not present

## 2013-02-28 DIAGNOSIS — Z79899 Other long term (current) drug therapy: Secondary | ICD-10-CM | POA: Diagnosis not present

## 2013-02-28 LAB — URINALYSIS, ROUTINE W REFLEX MICROSCOPIC
Bilirubin Urine: NEGATIVE
GLUCOSE, UA: NEGATIVE mg/dL
KETONES UR: NEGATIVE mg/dL
Nitrite: NEGATIVE
PROTEIN: NEGATIVE mg/dL
Specific Gravity, Urine: 1.005 (ref 1.005–1.030)
UROBILINOGEN UA: 0.2 mg/dL (ref 0.0–1.0)
pH: 7 (ref 5.0–8.0)

## 2013-02-28 LAB — URINE MICROSCOPIC-ADD ON

## 2013-02-28 LAB — CBC
HCT: 41.8 % (ref 36.0–46.0)
Hemoglobin: 13.6 g/dL (ref 12.0–15.0)
MCH: 26.8 pg (ref 26.0–34.0)
MCHC: 32.5 g/dL (ref 30.0–36.0)
MCV: 82.3 fL (ref 78.0–100.0)
Platelets: 154 10*3/uL (ref 150–400)
RBC: 5.08 MIL/uL (ref 3.87–5.11)
RDW: 14 % (ref 11.5–15.5)
WBC: 4 10*3/uL (ref 4.0–10.5)

## 2013-02-28 LAB — BASIC METABOLIC PANEL
BUN: 14 mg/dL (ref 6–23)
CALCIUM: 9.6 mg/dL (ref 8.4–10.5)
CO2: 23 mEq/L (ref 19–32)
CREATININE: 0.82 mg/dL (ref 0.50–1.10)
Chloride: 105 mEq/L (ref 96–112)
GFR calc Af Amer: 74 mL/min — ABNORMAL LOW (ref >90)
GFR calc non Af Amer: 63 mL/min — ABNORMAL LOW (ref >90)
Glucose, Bld: 91 mg/dL (ref 70–99)
Potassium: 4.4 mEq/L (ref 3.7–5.3)
SODIUM: 142 meq/L (ref 137–147)

## 2013-02-28 LAB — SEDIMENTATION RATE: SED RATE: 7 mm/h (ref 0–22)

## 2013-02-28 MED ORDER — TETRACAINE HCL 0.5 % OP SOLN
2.0000 [drp] | Freq: Once | OPHTHALMIC | Status: AC
  Administered 2013-02-28: 2 [drp] via OPHTHALMIC
  Filled 2013-02-28: qty 2

## 2013-02-28 MED ORDER — HYDRALAZINE HCL 20 MG/ML IJ SOLN
20.0000 mg | Freq: Once | INTRAMUSCULAR | Status: AC
  Administered 2013-02-28: 20 mg via INTRAVENOUS
  Filled 2013-02-28: qty 1

## 2013-02-28 NOTE — ED Notes (Addendum)
Pt told EMS she developed a headache/RT eye pain this am which usually makes her believe her BP is elevated-has hx of TIA in past.  She took her BP at home and it was 234/105. She then took her BP meds.  Stroke scale negative by EMS and BP 191/86, 61, 16, 97%RA.  She still c/o h/a.  She had cataract surgery to RT eye in Nov., this eye is red and irritated and this is the side of her headache as well.  Pt also reports not eating yet today

## 2013-02-28 NOTE — ED Notes (Signed)
Bed: WA04 Expected date:  Expected time:  Means of arrival:  Comments: 80 y/o F htn

## 2013-02-28 NOTE — ED Notes (Signed)
Family at bedside-husband.  Pt in no distress.

## 2013-02-28 NOTE — Discharge Instructions (Signed)
Use artificial tears on your right eye. It is very important to follow up with your eye doctor tomorrow.  You are having a headache. No specific cause was found today for your headache. It may have been a migraine or other cause of headache. Stress, anxiety, fatigue, and depression are common triggers for headaches. Your headache today does not appear to be life-threatening or require hospitalization, but often the exact cause of headaches is not determined in the emergency department. Therefore, follow-up with your doctor is very important to find out what may have caused your headache, and whether or not you need any further diagnostic testing or treatment. Sometimes headaches can appear benign (not harmful), but then more serious symptoms can develop which should prompt an immediate re-evaluation by your doctor or the emergency department. SEEK MEDICAL ATTENTION IF: You develop possible problems with medications prescribed.  The medications don't resolve your headache, if it recurs , or if you have multiple episodes of vomiting or can't take fluids. You have a change from the usual headache. RETURN IMMEDIATELY IF you develop a sudden, severe headache or confusion, become poorly responsive or faint, develop a fever above 100.43F or problem breathing, have a change in speech, vision, swallowing, or understanding, or develop new weakness, numbness, tingling, incoordination, or have a seizure.

## 2013-02-28 NOTE — ED Notes (Signed)
Patient transported to CT 

## 2013-02-28 NOTE — ED Provider Notes (Signed)
CSN: [id number]     Arrival date & time 02/28/13  1151 History   First MD Initiated Contact with Patient 02/28/13 1243     Chief Complaint  Patient presents with  . Hypertension   (Consider location/radiation/quality/duration/timing/severity/associated sxs/prior Treatment) HPI Comments: 80 yo female presents after waking up with a headache a few hours ago. The headache did not wake her up. The pain is right temporal and her right eye hurts. A few months ago she had right cataract surgery by Dr. [place], and recently stopped using drops (they were due to be discontinued). No blurry vision. This pain feels similar to when she has elevated BP, and her BP was over 200 at home. Normally she states she runs in the 140s. No chest pain, dyspnea, abdominal pain, vomiting, weakness. She took her BP meds as normal today. Rates the pain at 5/10.   Past Medical History  Diagnosis Date  . Hypertension   . Stroke   . Palpitations   . PAF (paroxysmal atrial fibrillation)   . Hypothyroidism   . Migraine    Past Surgical History  Procedure Laterality Date  . Maze      2006   Family History  Problem Relation Age of Onset  . Hypertension Mother    History  Substance Use Topics  . Smoking status: Never Smoker   . Smokeless tobacco: Never Used  . Alcohol Use: No   OB History   Grav Para Term Preterm Abortions TAB SAB Ect Mult Living                 Review of Systems  Constitutional: Negative for fever and chills.  Eyes: Positive for pain and redness. Negative for visual disturbance.  Respiratory: Negative for shortness of breath.   Cardiovascular: Negative for chest pain.  Gastrointestinal: Negative for abdominal pain.  Neurological: Positive for headaches. Negative for dizziness, speech difficulty, weakness, light-headedness and numbness.  All other systems reviewed and are negative.    Allergies  Aceon and Aspirin  Home Medications   Current Outpatient Rx  Name  Route  Sig  Dispense   Refill  . clopidogrel (PLAVIX) 75 MG tablet   Oral   Take 75 mg by mouth daily at 12 noon.          . irbesartan (AVAPRO) 150 MG tablet   Oral   Take 150 mg by mouth 2 (two) times daily.          . Multiple Vitamin (MULTIVITAMIN WITH MINERALS) TABS tablet   Oral   Take 1 tablet by mouth every morning.         . thyroid (ARMOUR) 15 MG tablet   Oral   Take 15 mg by mouth every morning.          . TRAVATAN Z 0.004 % SOLN ophthalmic solution   Both Eyes   Place 1 drop into both eyes at bedtime.           BP 192/78  Pulse 58  Temp(Src) 98.3 F (36.8 C) (Oral)  Resp 14  Ht '5\' 3"'  (1.6 m)  Wt 135 lb (61.236 kg)  BMI 23.92 kg/m2  SpO2 100% Physical Exam  Nursing note and vitals reviewed. Constitutional: She is oriented to person, place, and time. She appears well-developed and well-nourished.  HENT:  Head: Normocephalic and atraumatic.  Right Ear: External ear normal.  Left Ear: External ear normal.  Nose: Nose normal.  Right temporal tenderness  Eyes: EOM are normal. Pupils are  equal, round, and reactive to light. Right eye exhibits no discharge. Left eye exhibits no discharge.  Conjunctiva right eye injected  Cardiovascular: Normal rate, regular rhythm and normal heart sounds.   Pulmonary/Chest: Effort normal and breath sounds normal.  Abdominal: Soft. There is no tenderness.  Neurological: She is alert and oriented to person, place, and time. She has normal strength. No cranial nerve deficit or sensory deficit. She exhibits normal muscle tone. GCS eye subscore is 4. GCS verbal subscore is 5. GCS motor subscore is 6.  Skin: Skin is warm and dry.    ED Course  Procedures (including critical care time) Labs Review Labs Reviewed  BASIC METABOLIC PANEL - Abnormal; Notable for the following:    GFR calc non Af Amer 63 (*)    GFR calc Af Amer 74 (*)    All other components within normal limits  URINALYSIS, ROUTINE W REFLEX MICROSCOPIC - Abnormal; Notable for the  following:    Hgb urine dipstick TRACE (*)    Leukocytes, UA MODERATE (*)    All other components within normal limits  URINE MICROSCOPIC-ADD ON - Abnormal; Notable for the following:    Squamous Epithelial / LPF FEW (*)    All other components within normal limits  CBC  SEDIMENTATION RATE   Imaging Review Ct Head Wo Contrast  02/28/2013   CLINICAL DATA:  Elevated blood pressure. Right-sided headache and right eye pain. History migraines, hypertension and stroke.  EXAM: CT HEAD WITHOUT CONTRAST  TECHNIQUE: Contiguous axial images were obtained from the base of the skull through the vertex without intravenous contrast.  COMPARISON:  04/01/2011  FINDINGS: No intracranial hemorrhage.  Remote infarcts left frontal lobe and right parietal lobe. Small vessel disease type changes. No CT evidence of large acute infarct.  Global atrophy without hydrocephalus.  Vascular calcifications.  No intracranial mass lesion noted on this unenhanced exam.  Orbital structures unremarkable.  Mastoid air cells, middle ear cavities are clear. Minimal mucosal thickening ethmoid sinus air cells.  IMPRESSION: No intracranial hemorrhage or CT evidence of large acute infarct.  Remote infarcts left frontal lobe and right parietal lobe with small vessel disease type changes noted.  Global atrophy.   Electronically Signed   By: Chauncey Cruel M.D.   On: 02/28/2013 13:37    EKG Interpretation    Date/Time:  Tuesday February 28 2013 12:16:13 EST Ventricular Rate:  55 PR Interval:  135 QRS Duration: 94 QT Interval:  460 QTC Calculation: 440 R Axis:   54 Text Interpretation:  Sinus rhythm Probable LVH with secondary repol abnrm No significant change since last tracing Confirmed by Klay Sobotka  MD, Mekenzie Modeste ([id number]) on 02/28/2013 5:03:06 PM            MDM   1. Hypertension   2. Right Eye pain  She appears well, has normal neuro exam. VA 20/50 right, 20/200 left (asymptomatic). IOP 14 right, 20 left, both with 95% confidence on  tonopen. D/w Dr. [place], who feels the conjunctival redness/irritation is likely from dry eyes. Recommends artificial tears, and she will see in office tomorrow. Her HA is not c/w SAH or other bleed. CT benign, labs benign. Neuro exam normal. This is similar to other headaches, but due to her eye pain, tenderness and age an ESR was sent. If neg I do not feel she needs steroids for temporal arteritis. If positive would start on steroids. Care transferred to Dr. Tawnya Crook with ESR pending.     Ephraim Hamburger, MD 02/28/13 [phone]

## 2013-03-09 DIAGNOSIS — E559 Vitamin D deficiency, unspecified: Secondary | ICD-10-CM | POA: Diagnosis not present

## 2013-03-09 DIAGNOSIS — R7309 Other abnormal glucose: Secondary | ICD-10-CM | POA: Diagnosis not present

## 2013-03-09 DIAGNOSIS — E782 Mixed hyperlipidemia: Secondary | ICD-10-CM | POA: Diagnosis not present

## 2013-03-09 DIAGNOSIS — Z113 Encounter for screening for infections with a predominantly sexual mode of transmission: Secondary | ICD-10-CM | POA: Diagnosis not present

## 2013-03-09 DIAGNOSIS — R0602 Shortness of breath: Secondary | ICD-10-CM | POA: Diagnosis not present

## 2013-03-09 DIAGNOSIS — I10 Essential (primary) hypertension: Secondary | ICD-10-CM | POA: Diagnosis not present

## 2013-03-09 DIAGNOSIS — Z1159 Encounter for screening for other viral diseases: Secondary | ICD-10-CM | POA: Diagnosis not present

## 2013-03-09 DIAGNOSIS — E039 Hypothyroidism, unspecified: Secondary | ICD-10-CM | POA: Diagnosis not present

## 2013-03-09 LAB — BASIC METABOLIC PANEL
BUN: 16 mg/dL (ref 4–21)
Creatinine: 1.2 mg/dL — AB (ref <=1.1)
GLUCOSE: 92 mg/dL
SODIUM: 139 mmol/L (ref 137–147)

## 2013-03-09 LAB — TSH: TSH: 5.04 u[IU]/mL (ref <=5.90)

## 2013-03-09 LAB — HEMOGLOBIN A1C: HEMOGLOBIN A1C: 6.1 % — AB (ref 4.0–6.0)

## 2013-03-22 DIAGNOSIS — H20029 Recurrent acute iridocyclitis, unspecified eye: Secondary | ICD-10-CM | POA: Diagnosis not present

## 2013-03-24 DIAGNOSIS — R7309 Other abnormal glucose: Secondary | ICD-10-CM | POA: Diagnosis not present

## 2013-03-24 DIAGNOSIS — I10 Essential (primary) hypertension: Secondary | ICD-10-CM | POA: Diagnosis not present

## 2013-03-24 DIAGNOSIS — E039 Hypothyroidism, unspecified: Secondary | ICD-10-CM | POA: Diagnosis not present

## 2013-03-24 DIAGNOSIS — E782 Mixed hyperlipidemia: Secondary | ICD-10-CM | POA: Diagnosis not present

## 2013-04-12 DIAGNOSIS — H2000 Unspecified acute and subacute iridocyclitis: Secondary | ICD-10-CM | POA: Diagnosis not present

## 2013-04-12 DIAGNOSIS — H2510 Age-related nuclear cataract, unspecified eye: Secondary | ICD-10-CM | POA: Diagnosis not present

## 2013-04-14 DIAGNOSIS — H20029 Recurrent acute iridocyclitis, unspecified eye: Secondary | ICD-10-CM | POA: Diagnosis not present

## 2013-04-17 ENCOUNTER — Other Ambulatory Visit: Payer: Self-pay

## 2013-04-17 DIAGNOSIS — Z1231 Encounter for screening mammogram for malignant neoplasm of breast: Secondary | ICD-10-CM

## 2013-04-26 DIAGNOSIS — H2510 Age-related nuclear cataract, unspecified eye: Secondary | ICD-10-CM | POA: Diagnosis not present

## 2013-04-26 DIAGNOSIS — H2000 Unspecified acute and subacute iridocyclitis: Secondary | ICD-10-CM | POA: Diagnosis not present

## 2013-05-03 DIAGNOSIS — N183 Chronic kidney disease, stage 3 unspecified: Secondary | ICD-10-CM | POA: Diagnosis not present

## 2013-05-08 ENCOUNTER — Ambulatory Visit: Payer: Medicare Other

## 2013-05-08 DIAGNOSIS — I10 Essential (primary) hypertension: Secondary | ICD-10-CM | POA: Diagnosis not present

## 2013-05-08 DIAGNOSIS — R0989 Other specified symptoms and signs involving the circulatory and respiratory systems: Secondary | ICD-10-CM | POA: Diagnosis not present

## 2013-05-08 DIAGNOSIS — R7309 Other abnormal glucose: Secondary | ICD-10-CM | POA: Diagnosis not present

## 2013-05-08 DIAGNOSIS — M79609 Pain in unspecified limb: Secondary | ICD-10-CM | POA: Diagnosis not present

## 2013-05-08 DIAGNOSIS — N183 Chronic kidney disease, stage 3 unspecified: Secondary | ICD-10-CM | POA: Diagnosis not present

## 2013-05-10 ENCOUNTER — Ambulatory Visit (HOSPITAL_COMMUNITY)
Admission: RE | Admit: 2013-05-10 | Discharge: 2013-05-10 | Disposition: A | Discharge status: Home / Self Care | Payer: Medicare Other | Source: Ambulatory Visit | Attending: Cardiovascular Disease | Admitting: Cardiovascular Disease

## 2013-05-10 ENCOUNTER — Encounter: Payer: Self-pay | Admitting: Cardiovascular Disease

## 2013-05-10 ENCOUNTER — Ambulatory Visit (INDEPENDENT_AMBULATORY_CARE_PROVIDER_SITE_OTHER): Payer: Medicare Other | Admitting: Cardiovascular Disease

## 2013-05-10 VITALS — BP 230/90 | HR 57 | Ht 63.0 in | Wt 135.1 lb

## 2013-05-10 DIAGNOSIS — D689 Coagulation defect, unspecified: Secondary | ICD-10-CM | POA: Diagnosis not present

## 2013-05-10 DIAGNOSIS — I999 Unspecified disorder of circulatory system: Secondary | ICD-10-CM | POA: Diagnosis not present

## 2013-05-10 DIAGNOSIS — R0989 Other specified symptoms and signs involving the circulatory and respiratory systems: Secondary | ICD-10-CM

## 2013-05-10 DIAGNOSIS — Z01818 Encounter for other preprocedural examination: Secondary | ICD-10-CM

## 2013-05-10 DIAGNOSIS — I70229 Atherosclerosis of native arteries of extremities with rest pain, unspecified extremity: Secondary | ICD-10-CM

## 2013-05-10 DIAGNOSIS — R5383 Other fatigue: Secondary | ICD-10-CM | POA: Diagnosis not present

## 2013-05-10 DIAGNOSIS — I998 Other disorder of circulatory system: Secondary | ICD-10-CM | POA: Insufficient documentation

## 2013-05-10 DIAGNOSIS — Z79899 Other long term (current) drug therapy: Secondary | ICD-10-CM | POA: Diagnosis not present

## 2013-05-10 DIAGNOSIS — R5381 Other malaise: Secondary | ICD-10-CM | POA: Diagnosis not present

## 2013-05-10 DIAGNOSIS — I739 Peripheral vascular disease, unspecified: Secondary | ICD-10-CM | POA: Insufficient documentation

## 2013-05-10 NOTE — Progress Notes (Signed)
Right Lower Ext. Arterial Duplex Completed. Joline Encalada, BS, RDMS, RVT  

## 2013-05-10 NOTE — Patient Instructions (Signed)
Your physician has requested that you have a lower arterial duplex. This test is an ultrasound of the arteries in the legs or arms. It looks at arterial blood flow in the legs and arms. Allow one hour for Lower and Upper Arterial scans. There are no restrictions or special instructions.  Dr. Gwenlyn Found has ordered a peripheral angiogram to be done at [facility].  This procedure is going to look at the bloodflow in your lower extremities.  If Dr. Gwenlyn Found is able to open up the arteries, you will have to spend one night in the hospital.  If he is not able to open the arteries, you will be able to go home that same day.    After the procedure, you will not be allowed to drive for 3 days or push, pull, or lift anything greater than 10 lbs for one week.    You will be required to have bloodwork and a chest xray prior to your procedure. Please have this done at [place] ([place] Our scheduler will advise you on when these items need to be done.     Reps - Event organiser and [employer]

## 2013-05-10 NOTE — Progress Notes (Signed)
05/10/2013 [name]   30-Nov-1925  [id number]  Primary Physician Pcp Not In System Primary Cardiologist: Lorretta Harp MD Renae Gloss   HPI:  [name] is a delightful 80 year old married African American female mother of 4 children, grandmother of 7 grandchildren who was referred by Isaias Cowman PA-C at [facility] for critical limb ischemia.her past medical history is remarkable for paroxysmal atrial fibrillation and hypertension. She has had strokes in the past and is on Plavix. She developed right calf pain approximately 2-3 weeks ago exercising and since that time she's had claudication with ischemic abnormalities of several toes. Apparently Dopplers performed at [facility] clinic showed decreased circulation.   Current Outpatient Prescriptions  Medication Sig Dispense Refill  . chlorpheniramine-HYDROcodone (TUSSIONEX) 10-8 MG/5ML LQCR Take 5 mLs by mouth at bedtime as needed for cough.      . clopidogrel (PLAVIX) 75 MG tablet Take 75 mg by mouth daily at 12 noon.       . DUREZOL 0.05 % EMUL Place 1 drop into the right eye 2 (two) times daily.      . Multiple Vitamin (MULTIVITAMIN WITH MINERALS) TABS tablet Take 1 tablet by mouth every morning.      . Nepafenac (ILEVRO) 0.3 % SUSP Place 1 drop into the right eye daily.      [name] olmesartan (BENICAR) 40 MG tablet Take 40 mg by mouth daily.      [name] PROLENSA 0.07 % SOLN       . thyroid (ARMOUR) 15 MG tablet Take 15 mg by mouth every morning.        No current facility-administered medications for this visit.    Allergies  Allergen Reactions  . Aceon [Perindopril Erbumine]     cough  . Aspirin Cough    History   Social History  . Marital Status: Married    Spouse Name: N/A    Number of Children: N/A  . Years of Education: N/A   Occupational History  . Not on file.   Social History Main Topics  . Smoking status: Never Smoker   . Smokeless tobacco: Never Used  . Alcohol Use: No  . Drug Use: No  .  Sexual Activity: No   Other Topics Concern  . Not on file   Social History Narrative  . No narrative on file     Review of Systems: General: negative for chills, fever, night sweats or weight changes.  Cardiovascular: negative for chest pain, dyspnea on exertion, edema, orthopnea, palpitations, paroxysmal nocturnal dyspnea or shortness of breath Dermatological: negative for rash Respiratory: negative for cough or wheezing Urologic: negative for hematuria Abdominal: negative for nausea, vomiting, diarrhea, bright red blood per rectum, melena, or hematemesis Neurologic: negative for visual changes, syncope, or dizziness All other systems reviewed and are otherwise negative except as noted above.    Blood pressure 230/90, pulse 57, height 5\' 3"  (1.6 m), weight 135 lb 1.6 oz (61.281 kg).  General appearance: alert and no distress Neck: no adenopathy, no carotid bruit, no JVD, supple, symmetrical, trachea midline and thyroid not enlarged, symmetric, no tenderness/mass/nodules Lungs: clear to auscultation bilaterally Heart: regular rate and rhythm, S1, S2 normal, no murmur, click, rub or gallop Abdomen: soft, non-tender; bowel sounds normal; no masses,  no organomegaly Extremities: extremities normal, atraumatic, no cyanosis or edema and 2+ femoral pulses without bruits, absent right pedal, 2+ left pedal pulses. Her right foot is cool to the touch and she has purplish discoloration of several  toes.  EKG her EKG shows a sinus rhythm at 66 with nonspecific ST and T-wave changes  ASSESSMENT AND PLAN:   Critical lower limb ischemia The patient was referred to me by Isaias Cowman PA-C at [facility] clinic for critical limb ischemia. She has a history of paroxysmal fibrillation. She relates new onset right calf pain/claudication or discoloration of her toes over the last 2 weeks. She apparently had lower extremity arterial Doppler studies performed at [facility] clinic which were abnormal. This led  to her fall today. She has absent pedal pulses on the right and and her right foot is cold to the touch. I'm going to velocity not lower extremity and arrange for her to undergo angiography and potential percutaneous intervention.      Lorretta Harp MD FACP,FACC,FAHA, [facility] 05/10/2013 12:05 PM

## 2013-05-10 NOTE — Assessment & Plan Note (Signed)
The patient was referred to me by Isaias Cowman PA-C at [facility] clinic for critical limb ischemia. She has a history of paroxysmal fibrillation. She relates new onset right calf pain/claudication or discoloration of her toes over the last 2 weeks. She apparently had lower extremity arterial Doppler studies performed at [facility] clinic which were abnormal. This led to her fall today. She has absent pedal pulses on the right and and her right foot is cold to the touch. I'm going to velocity not lower extremity and arrange for her to undergo angiography and potential percutaneous intervention.

## 2013-05-11 ENCOUNTER — Ambulatory Visit
Admission: RE | Admit: 2013-05-11 | Discharge: 2013-05-11 | Disposition: A | Discharge status: Home / Self Care | Payer: Medicare Other | Source: Ambulatory Visit | Attending: Cardiovascular Disease | Admitting: Cardiovascular Disease

## 2013-05-11 ENCOUNTER — Encounter (HOSPITAL_COMMUNITY): Payer: Self-pay | Admitting: Pharmacy Technician

## 2013-05-11 DIAGNOSIS — Z01818 Encounter for other preprocedural examination: Secondary | ICD-10-CM

## 2013-05-11 LAB — CBC
HEMATOCRIT: 38.1 % (ref 36.0–46.0)
HEMOGLOBIN: 12.5 g/dL (ref 12.0–15.0)
MCH: 26.3 pg (ref 26.0–34.0)
MCHC: 32.8 g/dL (ref 30.0–36.0)
MCV: 80.2 fL (ref 78.0–100.0)
Platelets: 231 10*3/uL (ref 150–400)
RBC: 4.75 MIL/uL (ref 3.87–5.11)
RDW: 14.7 % (ref 11.5–15.5)
WBC: 3.2 10*3/uL — ABNORMAL LOW (ref 4.0–10.5)

## 2013-05-12 LAB — BASIC METABOLIC PANEL
BUN: 16 mg/dL (ref 6–23)
CHLORIDE: 105 meq/L (ref 96–112)
CO2: 25 meq/L (ref 19–32)
Calcium: 9.3 mg/dL (ref 8.4–10.5)
Creat: 0.87 mg/dL (ref 0.50–1.10)
Glucose, Bld: 102 mg/dL — ABNORMAL HIGH (ref 70–99)
POTASSIUM: 4.5 meq/L (ref 3.5–5.3)
Sodium: 142 mEq/L (ref 135–145)

## 2013-05-12 LAB — PROTIME-INR
INR: 1.03 (ref <1.50)
PROTHROMBIN TIME: 13.4 s (ref 11.6–15.2)

## 2013-05-12 LAB — APTT: aPTT: 29 seconds (ref 24–37)

## 2013-05-12 LAB — TSH: TSH: 3.408 u[IU]/mL (ref 0.350–4.500)

## 2013-05-15 ENCOUNTER — Encounter: Payer: Self-pay | Admitting: *Deleted

## 2013-05-15 ENCOUNTER — Ambulatory Visit (HOSPITAL_COMMUNITY)
Admission: RE | Admit: 2013-05-15 | Discharge: 2013-05-16 | Disposition: A | Discharge status: Home / Self Care | Payer: Medicare Other | Source: Ambulatory Visit | Attending: Cardiovascular Disease | Admitting: Cardiovascular Disease

## 2013-05-15 ENCOUNTER — Encounter (HOSPITAL_COMMUNITY): Payer: Self-pay | Admitting: General Practice

## 2013-05-15 ENCOUNTER — Encounter (HOSPITAL_COMMUNITY)
Admission: RE | Disposition: A | Discharge status: Home / Self Care | Payer: Self-pay | Source: Ambulatory Visit | Attending: Cardiovascular Disease

## 2013-05-15 DIAGNOSIS — R112 Nausea with vomiting, unspecified: Secondary | ICD-10-CM | POA: Diagnosis not present

## 2013-05-15 DIAGNOSIS — I10 Essential (primary) hypertension: Secondary | ICD-10-CM | POA: Diagnosis present

## 2013-05-15 DIAGNOSIS — I998 Other disorder of circulatory system: Secondary | ICD-10-CM

## 2013-05-15 DIAGNOSIS — I70229 Atherosclerosis of native arteries of extremities with rest pain, unspecified extremity: Secondary | ICD-10-CM

## 2013-05-15 DIAGNOSIS — I70219 Atherosclerosis of native arteries of extremities with intermittent claudication, unspecified extremity: Secondary | ICD-10-CM | POA: Insufficient documentation

## 2013-05-15 HISTORY — PX: LOWER EXTREMITY ANGIOGRAM: SHX5508

## 2013-05-15 HISTORY — DX: Unspecified osteoarthritis, unspecified site: M19.90

## 2013-05-15 HISTORY — DX: Unspecified glaucoma: H40.9

## 2013-05-15 HISTORY — DX: Peripheral vascular disease, unspecified: I73.9

## 2013-05-15 HISTORY — PX: POPLITEAL ARTERY STENT: SHX2243

## 2013-05-15 HISTORY — DX: Pure hypercholesterolemia, unspecified: E78.00

## 2013-05-15 LAB — POCT ACTIVATED CLOTTING TIME
ACTIVATED CLOTTING TIME: 243 s
ACTIVATED CLOTTING TIME: 160 s
Activated Clotting Time: 198 seconds

## 2013-05-15 SURGERY — ANGIOGRAM, LOWER EXTREMITY
Anesthesia: LOCAL | Laterality: Right

## 2013-05-15 MED ORDER — NITROGLYCERIN IN D5W 200-5 MCG/ML-% IV SOLN
10.0000 ug/min | INTRAVENOUS | Status: DC
  Administered 2013-05-15: 18:00:00 10 ug/min via INTRAVENOUS
  Administered 2013-05-15: 40 ug/min via INTRAVENOUS
  Administered 2013-05-15: 30 ug/min via INTRAVENOUS
  Administered 2013-05-15: 50000 ug via INTRAVENOUS

## 2013-05-15 MED ORDER — MIDAZOLAM HCL 2 MG/2ML IJ SOLN
INTRAMUSCULAR | Status: AC
  Filled 2013-05-15: qty 2

## 2013-05-15 MED ORDER — HYDRALAZINE HCL 20 MG/ML IJ SOLN
INTRAMUSCULAR | Status: AC
  Filled 2013-05-15: qty 1

## 2013-05-15 MED ORDER — LIDOCAINE HCL (PF) 1 % IJ SOLN
INTRAMUSCULAR | Status: AC
  Filled 2013-05-15: qty 30

## 2013-05-15 MED ORDER — PROMETHAZINE HCL 6.25 MG/5ML PO SYRP
6.2500 mg | ORAL_SOLUTION | Freq: Once | ORAL | Status: AC
  Administered 2013-05-15: 19:00:00 6.25 mg via ORAL
  Filled 2013-05-15: qty 5

## 2013-05-15 MED ORDER — PREDNISOLONE ACETATE 1 % OP SUSP
1.0000 [drp] | Freq: Two times a day (BID) | OPHTHALMIC | Status: DC
  Filled 2013-05-15: qty 1

## 2013-05-15 MED ORDER — SODIUM CHLORIDE 0.9 % IV SOLN: INTRAVENOUS | Status: AC

## 2013-05-15 MED ORDER — FENTANYL CITRATE 0.05 MG/ML IJ SOLN
INTRAMUSCULAR | Status: AC
Start: 2013-05-15 — End: 2013-05-15
  Filled 2013-05-15: qty 2

## 2013-05-15 MED ORDER — HYDRALAZINE HCL 20 MG/ML IJ SOLN
10.0000 mg | INTRAMUSCULAR | Status: DC
  Administered 2013-05-15 (×3): 10 mg via INTRAVENOUS
  Filled 2013-05-15: qty 1

## 2013-05-15 MED ORDER — CLOPIDOGREL BISULFATE 75 MG PO TABS
75.0000 mg | ORAL_TABLET | Freq: Every day | ORAL | Status: DC
  Administered 2013-05-16: 75 mg via ORAL

## 2013-05-15 MED ORDER — HEPARIN (PORCINE) IN NACL 2-0.9 UNIT/ML-% IJ SOLN
INTRAMUSCULAR | Status: AC
  Filled 2013-05-15: qty 1000

## 2013-05-15 MED ORDER — IRBESARTAN 300 MG PO TABS
300.0000 mg | ORAL_TABLET | Freq: Every day | ORAL | Status: DC
  Filled 2013-05-15: qty 1

## 2013-05-15 MED ORDER — HEPARIN SODIUM (PORCINE) 1000 UNIT/ML IJ SOLN
INTRAMUSCULAR | Status: AC
  Filled 2013-05-15: qty 1

## 2013-05-15 MED ORDER — THYROID 30 MG PO TABS
15.0000 mg | ORAL_TABLET | Freq: Every morning | ORAL | Status: DC
  Administered 2013-05-16: 15 mg via ORAL
  Filled 2013-05-15 (×2): qty 1

## 2013-05-15 MED ORDER — LORAZEPAM 0.5 MG PO TABS
0.5000 mg | ORAL_TABLET | Freq: Once | ORAL | Status: DC
  Filled 2013-05-15: qty 1

## 2013-05-15 MED ORDER — LORAZEPAM 0.5 MG PO TABS
0.5000 mg | ORAL_TABLET | Freq: Once | ORAL | Status: AC
  Administered 2013-05-15: 0.5 mg via ORAL

## 2013-05-15 MED ORDER — HEPARIN (PORCINE) IN NACL 2-0.9 UNIT/ML-% IJ SOLN
INTRAMUSCULAR | Status: AC
  Filled 2013-05-15: qty 500

## 2013-05-15 MED ORDER — NEPAFENAC 0.3 % OP SUSP: 1.0000 [drp] | Freq: Every day | OPHTHALMIC | Status: DC

## 2013-05-15 MED ORDER — HYDRALAZINE HCL 20 MG/ML IJ SOLN
10.0000 mg | Freq: Four times a day (QID) | INTRAMUSCULAR | Status: DC | PRN
  Administered 2013-05-15: 10 mg via INTRAVENOUS
  Filled 2013-05-15 (×2): qty 1

## 2013-05-15 MED ORDER — HYDROCOD POLST-CHLORPHEN POLST 10-8 MG/5ML PO LQCR
2.5000 mL | Freq: Every evening | ORAL | Status: DC | PRN
Start: 2013-05-15 — End: 2013-05-16
  Administered 2013-05-15: 2.5 mL via ORAL
  Filled 2013-05-15: qty 5

## 2013-05-15 MED ORDER — DIFLUPREDNATE 0.05 % OP EMUL: 1.0000 [drp] | Freq: Two times a day (BID) | OPHTHALMIC | Status: DC

## 2013-05-15 MED ORDER — NITROGLYCERIN IN D5W 200-5 MCG/ML-% IV SOLN
INTRAVENOUS | Status: AC
  Administered 2013-05-15: 17:00:00 50000 ug via INTRAVENOUS
  Filled 2013-05-15: qty 250

## 2013-05-15 MED ORDER — CLOPIDOGREL BISULFATE 75 MG PO TABS: 75.0000 mg | ORAL_TABLET | Freq: Every day | ORAL | Status: DC

## 2013-05-15 MED ORDER — DIAZEPAM 5 MG PO TABS
ORAL_TABLET | ORAL | Status: AC
  Administered 2013-05-15: 5 mg via ORAL
  Filled 2013-05-15: qty 1

## 2013-05-15 MED ORDER — SODIUM CHLORIDE 0.9 % IV SOLN
INTRAVENOUS | Status: DC
  Administered 2013-05-15: 11:00:00 via INTRAVENOUS

## 2013-05-15 MED ORDER — MORPHINE SULFATE 2 MG/ML IJ SOLN
2.0000 mg | INTRAMUSCULAR | Status: DC | PRN
  Administered 2013-05-15: 17:00:00 2 mg via INTRAVENOUS
  Filled 2013-05-15: qty 1

## 2013-05-15 MED ORDER — DIAZEPAM 5 MG PO TABS
5.0000 mg | ORAL_TABLET | ORAL | Status: AC
  Administered 2013-05-15: 5 mg via ORAL

## 2013-05-15 MED ORDER — ONDANSETRON HCL 4 MG/2ML IJ SOLN
4.0000 mg | Freq: Four times a day (QID) | INTRAMUSCULAR | Status: DC | PRN
  Administered 2013-05-15 (×2): 4 mg via INTRAVENOUS
  Filled 2013-05-15: qty 2

## 2013-05-15 MED ORDER — SODIUM CHLORIDE 0.9 % IJ SOLN: 3.0000 mL | INTRAMUSCULAR | Status: DC | PRN

## 2013-05-15 MED ORDER — ONDANSETRON HCL 4 MG/2ML IJ SOLN
INTRAMUSCULAR | Status: AC
  Administered 2013-05-15: 17:00:00 4 mg
  Filled 2013-05-15: qty 2

## 2013-05-15 MED ORDER — KETOROLAC TROMETHAMINE 0.5 % OP SOLN
1.0000 [drp] | Freq: Every day | OPHTHALMIC | Status: DC
  Filled 2013-05-15: qty 3

## 2013-05-15 NOTE — Progress Notes (Signed)
Patient arrived on unit at 1500.  Between 1500 and 1900 patient managed for persistent  "nausea and vomiting", head ache, chest pain, and anxiety.  I did not witness N & V, but did observe multiple episodes of what appeared to be coughing up saliva. Quantities very small. Chest pain mild and restricted to medial left shoulder. Lauretta Chester, PA, and Georges Lynch, [place] advised.

## 2013-05-15 NOTE — CV Procedure (Signed)
[name] is a 80 y.o. female    [id number] LOCATION:  FACILITY: [facility]  PHYSICIAN: Quay Burow, M.D. 05/25/1925   DATE OF PROCEDURE:  05/15/2013  DATE OF DISCHARGE:     PV Angiogram/Intervention    History obtained from chart review.[name] is a delightful 80 y.o. married African American female mother of 4 children, grandmother of 7 grandchildren who was referred by Isaias Cowman PA-C at [facility] for critical limb ischemia.her past medical history is remarkable for paroxysmal atrial fibrillation and hypertension. She has had strokes in the past and is on Plavix. She developed right calf pain approximately 2-3 weeks ago exercising and since that time she's had claudication with ischemic abnormalities of several toes. Apparently Dopplers performed at [facility] clinic showed decreased circulation. Arterial Dopplers performed our office showed an occluded right popliteal artery with one vessel runoff. She presents now for angiography and potential percutaneous revascularization    PROCEDURE DESCRIPTION:   The patient was brought to the second floor [facility] cath lab in the postabsorptive state. She was premedicated with Valium 5 mg by mouth, IV Versed and fentanyl. Her left groinwas prepped and shaved in usual sterile fashion. Xylocaine 1% was used for local anesthesia. A 7 French 45 cm destination sheath was inserted into the left common femoral artery using standard Seldinger technique. A 5 French pigtail catheter was used for abdominal aortography, bilateral iliac angiography and bifemoral runoff using bolus chase digital subtraction step table technique. Visipaque was used for the entirety of the case. Retrograde aortic pressure was monitored during the case.   HEMODYNAMICS:    AO SYSTOLIC/AO DIASTOLIC: [id number]   Angiographic Data:   1: Abdominal aortogram-renal artery is widely patent. The infrarenal abdominal aorta was free of significant atherosclerotic  changes  2: Left lower extremity-normal with 3 vessel runoff  3: Right lower extremity-occluded popliteal artery in the P1 &  P2 segments. PIII reconstituted by geniculate collaterals. There was one vessel runoff to the anterior tibial  IMPRESSION:occluded right popliteal artery, symptomatically. We will proceed with percutaneous revascularization.  Procedure Description:contralateral access was obtained with a 5 [place] crossover catheter, 0.35 Versicore  wire and the destination sheath. The patient received 5000 units of heparin intravenously with an ACT of 243. Total contrast administered the patient was 163 cc. The occlusion was crossed with a 0.35 Navicross endhole catheter along with a stiff 0.35 angled Glidewire. I documented that I was intravascular and exchanged for a BMW wire. I then perform PTA with a 3 mm x 60 mm long balloon followed by a 6 mm x 80 mm long chocolate balloon which recanalize the occluded segment with significant linear dissection. I then stented with a 5.5 mm x 80 mm long IDEV stent with excellent angiographic result. Angiogram was performed showing a patent anterior tibial with retrograde fill of the peroneal and posterior tibial arteries. It appeared that the tibioperoneal trunk was occluded. The sheath was then withdrawn across the bifurcation and exchanged for a short 7 [place] sheath. The patient left the lab in stable condition.  Final Impression: successful PTA and stenting of an occluded right popliteal artery with a chocolate balloon and IDEV  stent. The patient is apparently intolerant to aspirin and therefore will be treated with Plavix. She'll be hydrated overnight and discharged home in the morning. We will get followup arterial Dopplers of her [place] office at which see her back.    Lorretta Harp MD, [facility] 05/15/2013 2:27 PM

## 2013-05-15 NOTE — Progress Notes (Signed)
Pt has received IV zofran and hydralazine and po phenergen and ativan given for nausea and anxiety.  IV hydralazine given as well as NTG gtt titrated up to 40 mcg/min to get BP down for sheath pull.    Site area: left groin  Site Prior to Removal:  Level 0  Pressure Applied For 30 MINUTES    Minutes Beginning at 2035  Manual:   yes  Patient Status During Pull:  Had episode of vomiting that resulted in HR up to 140 and BP up to 188/114.  IV hydralazine given repeated by Donna Christen RN.  Turned onto side by RN and NT while this RN maintained pressure on left groin.  Post Pull Groin Site:  Level 0  Post Pull Instructions Given:  yes written and verbal.  Post Pull Pulses Present:  yes, 2+  Dressing Applied:  yes  Comments:  See VS flowsheet and CV strips in EPIC

## 2013-05-16 ENCOUNTER — Encounter: Payer: Self-pay | Admitting: Cardiovascular Disease

## 2013-05-16 ENCOUNTER — Other Ambulatory Visit: Payer: Self-pay | Admitting: Physician Assistant

## 2013-05-16 DIAGNOSIS — I999 Unspecified disorder of circulatory system: Secondary | ICD-10-CM

## 2013-05-16 DIAGNOSIS — I739 Peripheral vascular disease, unspecified: Secondary | ICD-10-CM

## 2013-05-16 LAB — BASIC METABOLIC PANEL
BUN: 18 mg/dL (ref 6–23)
CALCIUM: 8.8 mg/dL (ref 8.4–10.5)
CO2: 21 mEq/L (ref 19–32)
CREATININE: 0.93 mg/dL (ref 0.50–1.10)
Chloride: 107 mEq/L (ref 96–112)
GFR, EST AFRICAN AMERICAN: 63 mL/min — AB (ref >90)
GFR, EST NON AFRICAN AMERICAN: 54 mL/min — AB (ref >90)
GLUCOSE: 100 mg/dL — AB (ref 70–99)
POTASSIUM: 4.1 meq/L (ref 3.7–5.3)
Sodium: 144 mEq/L (ref 137–147)

## 2013-05-16 LAB — CBC
HCT: 33.9 % — ABNORMAL LOW (ref 36.0–46.0)
HEMOGLOBIN: 11.2 g/dL — AB (ref 12.0–15.0)
MCH: 27.1 pg (ref 26.0–34.0)
MCHC: 33 g/dL (ref 30.0–36.0)
MCV: 82.1 fL (ref 78.0–100.0)
PLATELETS: 202 10*3/uL (ref 150–400)
RBC: 4.13 MIL/uL (ref 3.87–5.11)
RDW: 14.4 % (ref 11.5–15.5)
WBC: 5.9 10*3/uL (ref 4.0–10.5)

## 2013-05-16 MED ORDER — OLMESARTAN MEDOXOMIL 40 MG PO TABS
40.0000 mg | ORAL_TABLET | Freq: Every day | ORAL | Status: DC
Start: 2013-05-16 — End: 2013-05-16
  Filled 2013-05-16: qty 1

## 2013-05-16 MED ORDER — SODIUM CHLORIDE 0.9 % IV SOLN
INTRAVENOUS | Status: DC
  Administered 2013-05-16: via INTRAVENOUS

## 2013-05-16 MED ORDER — CARVEDILOL 3.125 MG PO TABS: 3.1250 mg | ORAL_TABLET | Freq: Two times a day (BID) | ORAL | Status: DC

## 2013-05-16 NOTE — Progress Notes (Signed)
     Patient Name: [name] Date of Encounter: 05/16/2013  Active Problems:   Hypertension   Claudication    SUBJECTIVE: No chest pain, no shortness of breath. She had significant nausea and vomiting last night but has been able to drink a little water this morning.  OBJECTIVE Filed Vitals:   05/16/13 0300 05/16/13 0400 05/16/13 0422 05/16/13 0500  BP: 128/60 140/50 140/50 135/55  Pulse: 81 73 72 75  Temp:   99 F (37.2 C)   TempSrc:   Oral   Resp:   20   Height:      Weight:      SpO2: 93% 93% 94% 93%    Intake/Output Summary (Last 24 hours) at 05/16/13 0716 Last data filed at 05/16/13 0534  Gross per 24 hour  Intake    769 ml  Output   1450 ml  Net   -681 ml   Filed Weights   05/15/13 1034 05/16/13 0033  Weight: 135 lb (61.236 kg) 134 lb 7.7 oz (61 kg)    PHYSICAL EXAM General: Well developed, well nourished, female in no acute distress. Head: Normocephalic, atraumatic.  Neck: Supple without bruits, JVD not elevated Lungs:  Resp regular and unlabored, CTA. Heart: RRR, S1, S2, no S3, S4,  1/6 SEM murmur; no rub. Abdomen: Soft, non-tender, non-distended, BS + x 4.  Extremities: No clubbing, cyanosis, no edema. Distal pulses intact, left groin cath site without hematoma or ecchymosis Neuro: Alert and oriented X 3. Moves all extremities spontaneously. Psych: Normal affect.  LABS: CBC:  Recent Labs  05/16/13 0425  WBC 5.9  HGB 11.2*  HCT 33.9*  MCV 82.1  PLT 123XX123   Basic Metabolic Panel:  Recent Labs  05/16/13 0425  NA 144  K 4.1  CL 107  CO2 21  GLUCOSE 100*  BUN 18  CREATININE 0.93  CALCIUM 8.8   TELE:   Sinus rhythm, sinus tach    Current Medications:  . clopidogrel  75 mg Oral Q breakfast  . hydrALAZINE  10 mg Intravenous UD  . irbesartan  300 mg Oral Daily  . ketorolac  1 drop Both Eyes Daily  . LORazepam  0.5 mg Oral Once  . prednisoLONE acetate  1 drop Both Eyes BID  . thyroid  15 mg Oral q morning - 10a   . sodium  chloride Stopped (05/16/13 0534)  . nitroGLYCERIN Stopped (05/16/13 0004)    ASSESSMENT AND PLAN: Active Problems:   Claudication - status post successful PTA and stenting of an occluded right popliteal artery with a chocolate balloon and IDEV stent.     Hypertension - blood pressure significantly elevated last p.m. despite having taken home medications. She was given hydralazine IV multiple times and started on IV nitroglycerin to get her blood pressure low enough for sheath pull. Blood pressure is much improved this a.m. continue home medication, M.D. advise on adding Coreg    Nausea and vomiting - had after the procedure, treated symptomatically and improved.  Gradually progressed diet and see how tolerated.  Plan: Discharge when medically stable   Signed, Rosaria Ferries , PA-C 7:16 AM 05/16/2013    Patient seen and examined. Agree with assessment and plan. Feels much better today. Has not yet been up to walk. Will ambulate; plan DC today. Continue benicar at 40 mg and add low dose BB. F/U Dr. Gwenlyn Found.   Troy Sine, MD, [facility] 05/16/2013 10:00 AM

## 2013-05-16 NOTE — Progress Notes (Signed)
Pt feeling much better this am, no nausea or pain.  BP 135/55, SR 80's.  Left groin bandage removed, level 0, bandaid applied.

## 2013-05-16 NOTE — Progress Notes (Addendum)
BP was 145/56 on 30 mcg/min NTG gtt.  Finally dozed off, BP one hour later 91/30.  NTG gtt turned off.  Pt awakened easily, denies complaints, left groin level 0, pt denies flank pain on palpation.  BP remains low, 102/28, 95/37.  Will continue to monitor.

## 2013-05-16 NOTE — Discharge Instructions (Signed)
PLEASE REMEMBER TO BRING ALL OF YOUR MEDICATIONS TO EACH OF YOUR FOLLOW-UP OFFICE VISITS. ° °PLEASE ATTEND ALL SCHEDULED FOLLOW-UP APPOINTMENTS.  ° °Activity: Increase activity slowly as tolerated. You may shower, but no soaking baths (or swimming) for 1 week. No driving for 2 days. No lifting over 5 lbs for 1 week. No sexual activity for 1 week.  ° °You May Return to Work: in 1 week (if applicable) ° °Wound Care: You may wash cath site gently with soap and water. Keep cath site clean and dry. If you notice pain, swelling, bleeding or pus at your cath site, please call [phone]. ° ° ° °Cardiac Cath Site Care °Refer to this sheet in the next few weeks. These instructions provide you with information on caring for yourself after your procedure. Your caregiver may also give you more specific instructions. Your treatment has been planned according to current medical practices, but problems sometimes occur. Call your caregiver if you have any problems or questions after your procedure. °HOME CARE INSTRUCTIONS °· You may shower 24 hours after the procedure. Remove the bandage (dressing) and gently wash the site with plain soap and water. Gently pat the site dry.  °· Do not apply powder or lotion to the site.  °· Do not sit in a bathtub, swimming pool, or whirlpool for 5 to 7 days.  °· No bending, squatting, or lifting anything over 10 pounds (4.5 kg) as directed by your caregiver.  °· Inspect the site at least twice daily.  °· Do not drive home if you are discharged the same day of the procedure. Have someone else drive you.  °· You may drive 24 hours after the procedure unless otherwise instructed by your caregiver.  °What to expect: °· Any bruising will usually fade within 1 to 2 weeks.  °· Blood that collects in the tissue (hematoma) may be painful to the touch. It should usually decrease in size and tenderness within 1 to 2 weeks.  °SEEK IMMEDIATE MEDICAL CARE IF: °· You have unusual pain at the site or down the  affected limb.  °· You have redness, warmth, swelling, or pain at the site.  °· You have drainage (other than a small amount of blood on the dressing).  °· You have chills.  °· You have a fever or persistent symptoms for more than 72 hours.  °· You have a fever and your symptoms suddenly get worse.  °· Your leg becomes pale, cool, tingly, or numb.  °· You have heavy bleeding from the site. Hold pressure on the site.  °Document Released: 03/14/2010 Document Revised: 01/29/2011 Document Reviewed: 03/14/2010 °ExitCare® Patient Information ©2012 [employer]. ° °

## 2013-05-16 NOTE — Discharge Summary (Signed)
CARDIOLOGY DISCHARGE SUMMARY   Patient ID: [name] MRN: [id number] DOB/AGE: [birth year] 80 y.o.  Admit date: 05/15/2013 Discharge date: 05/16/2013  PCP: Gwendel Hanson Primary Cardiologist: Gwenlyn Found  Primary Discharge Diagnosis: Claudication, critical limb ischemia Secondary Discharge Diagnosis:  Hypertension  Procedures: Abdominal aortogram, lower extremity angiogram, percutaneous intervention of occluded right popliteal artery with a chocolate balloon and IDEV stent  Hospital Course: [name] is a 80 y.o. female with a history of PAF, hypertension and recently diagnosed claudication. She had arterial Dopplers showing an occluded right popliteal artery and was referred for percutaneous intervention. She came to the hospital on 3/23 for the procedure.  Procedure results are below. She had successful percutaneous intervention to the occluded right popliteal artery and tolerated the procedure well. Post-procedure, she had problems with significant hypertension as well as nausea and vomiting. She was treated with when necessary medications including Zofran, IV nitroglycerin and IV hydralazine. She was hydrated. Her symptoms gradually improved.  On 3/24, she was seen by Dr. Claiborne Billings. All data were reviewed. Her blood pressure was improved but still elevated and she is being restarted on her home medications.   In addition to her normal dose of her ARB, we added a low dose of a beta blocker for better blood pressure control. However, the patient was extremely reluctant to take this as she states her HR runs in the 50s at times at home. She is requested to discuss this with her primary cardiologist.    Her cath site was without ecchymosis or hematoma. She was able to eat and ambulated without chest pain or shortness of breath. No further inpatient workup is indicated and she was considered stable for discharge, to followup as an outpatient.  Labs:   Lab Results  Component  Value Date   WBC 5.9 05/16/2013   HGB 11.2* 05/16/2013   HCT 33.9* 05/16/2013   MCV 82.1 05/16/2013   PLT 202 05/16/2013     Recent Labs Lab 05/16/13 0425  NA 144  K 4.1  CL 107  CO2 21  BUN 18  CREATININE 0.93  CALCIUM 8.8  GLUCOSE 100*    Radiology: Dg Chest 2 View 05/11/2013   CLINICAL DATA:  Preprocedure for peripheral angiogram  EXAM: CHEST  2 VIEW  COMPARISON:  None.  FINDINGS: No active infiltrate or effusion is seen. Surgical clips overlie the left hilum. The heart is mildly enlarged. No bony abnormality is seen.  IMPRESSION: Mild cardiomegaly.  No active lung disease.   Electronically Signed   By: Ivar Drape M.D.   On: 05/11/2013 14:39    Cardiac Cath: 05/16/2011 Angiographic Data:  1: Abdominal aortogram-renal artery is widely patent. The infrarenal abdominal aorta was free of significant atherosclerotic changes  2: Left lower extremity-normal with 3 vessel runoff  3: Right lower extremity-occluded popliteal artery in the P1 & P2 segments. PIII reconstituted by geniculate collaterals. There was one vessel runoff to the anterior tibial  IMPRESSION:occluded right popliteal artery, symptomatically. We will proceed with percutaneous revascularization.  Procedure Description:contralateral access was obtained with a 5 [place] crossover catheter, 0.35 Versicore wire and the destination sheath. The patient received 5000 units of heparin intravenously with an ACT of 243. Total contrast administered the patient was 163 cc. The occlusion was crossed with a 0.35 Navicross endhole catheter along with a stiff 0.35 angled Glidewire. I documented that I was intravascular and exchanged for a BMW wire. I then perform PTA with a 3 mm x 60  mm long balloon followed by a 6 mm x 80 mm long chocolate balloon which recanalize the occluded segment with significant linear dissection. I then stented with a 5.5 mm x 80 mm long IDEV stent with excellent angiographic result. Angiogram was performed showing a  patent anterior tibial with retrograde fill of the peroneal and posterior tibial arteries. It appeared that the tibioperoneal trunk was occluded. The sheath was then withdrawn across the bifurcation and exchanged for a short 7 [place] sheath. The patient left the lab in stable condition.  Final Impression: successful PTA and stenting of an occluded right popliteal artery with a chocolate balloon and IDEV stent. The patient is apparently intolerant to aspirin and therefore will be treated with Plavix. She'll be hydrated overnight and discharged home in the morning. We will get followup arterial Dopplers of her [facility] office at which see her back.   FOLLOW UP PLANS AND APPOINTMENTS Allergies  Allergen Reactions  . Aceon [Perindopril Erbumine]     cough  . Aspirin Cough  . Vitamin D Analogs Cough     Medication List         carvedilol 3.125 MG tablet  Commonly known as:  COREG  Take 1 tablet (3.125 mg total) by mouth 2 (two) times daily with a meal.     chlorpheniramine-HYDROcodone 10-8 MG/5ML Lqcr  Commonly known as:  TUSSIONEX  Take 2.5 mLs by mouth at bedtime as needed for cough.     clopidogrel 75 MG tablet  Commonly known as:  PLAVIX  Take 75 mg by mouth daily at 12 noon.     DUREZOL 0.05 % Emul  Generic drug:  Difluprednate  Place 1 drop into the right eye 2 (two) times daily.     ILEVRO 0.3 % Susp  Generic drug:  Nepafenac  Place 1 drop into the right eye daily.     multivitamin with minerals Tabs tablet  Take 1 tablet by mouth every morning.     olmesartan 40 MG tablet  Commonly known as:  BENICAR  Take 40 mg by mouth daily.     thyroid 15 MG tablet  Commonly known as:  ARMOUR  Take 15 mg by mouth every morning.     Travoprost (BAK Free) 0.004 % Soln ophthalmic solution  Commonly known as:  TRAVATAN  Place 1 drop into both eyes at bedtime.        Discharge Orders   Future Appointments Provider Department Dept Phone   05/25/2013 9:00 AM [facility]   [facility] [address] [id number]   06/05/2013 11:00 AM Burnell Blanks, MD [facility] Office [phone]   06/12/2013 2:15 PM Lorretta Harp, MD [facility] [phone]   06/23/2013 1:40 PM [facility] [place]  IMAGING [phone]   Please wear two piece clothing and wear no powder or deodorant. Please arrive 15 minutes early prior to your appointment time.   Future Orders Complete By Expires   Diet - low sodium heart healthy  As directed    Increase activity slowly  As directed      Follow-up Information   Follow up with [facility] On 05/25/2013. (Dopplers at 9:00 am)    Contact information:   [address] [place] [address] [phone]      Follow up with Lorretta Harp, MD On 06/12/2013. (at 2:15 pm)    Specialty:  Cardiology   Contact information:   [address]  [address] [place] [address] [phone]       Follow up with Lauree Chandler, MD On 06/05/2013. (at 11:00 am)    Specialty:  Cardiology   Contact information:   [address] [place] [address] [phone]       BRING ALL MEDICATIONS WITH YOU TO FOLLOW UP APPOINTMENTS  Time spent with patient to include physician time: 46 min Signed: Rosaria Ferries, PA-C 05/16/2013, 3:39 PM Co-Sign MD

## 2013-05-19 ENCOUNTER — Ambulatory Visit (HOSPITAL_COMMUNITY)
Admission: RE | Admit: 2013-05-19 | Discharge: 2013-05-19 | Disposition: A | Discharge status: Home / Self Care | Payer: Medicare Other | Source: Ambulatory Visit | Attending: Internal Medicine | Admitting: Internal Medicine

## 2013-05-19 ENCOUNTER — Other Ambulatory Visit (HOSPITAL_COMMUNITY): Payer: Self-pay | Admitting: Cardiovascular Disease

## 2013-05-19 ENCOUNTER — Ambulatory Visit (INDEPENDENT_AMBULATORY_CARE_PROVIDER_SITE_OTHER): Payer: Medicare Other | Admitting: Physician Assistant

## 2013-05-19 ENCOUNTER — Encounter: Payer: Self-pay | Admitting: Physician Assistant

## 2013-05-19 VITALS — BP 200/80 | HR 60 | Ht 63.0 in | Wt 130.0 lb

## 2013-05-19 DIAGNOSIS — I4891 Unspecified atrial fibrillation: Secondary | ICD-10-CM

## 2013-05-19 DIAGNOSIS — I739 Peripheral vascular disease, unspecified: Secondary | ICD-10-CM

## 2013-05-19 DIAGNOSIS — I10 Essential (primary) hypertension: Secondary | ICD-10-CM | POA: Diagnosis not present

## 2013-05-19 DIAGNOSIS — I498 Other specified cardiac arrhythmias: Secondary | ICD-10-CM

## 2013-05-19 DIAGNOSIS — R001 Bradycardia, unspecified: Secondary | ICD-10-CM | POA: Insufficient documentation

## 2013-05-19 DIAGNOSIS — I480 Paroxysmal atrial fibrillation: Secondary | ICD-10-CM

## 2013-05-19 MED ORDER — HYDRALAZINE HCL 50 MG PO TABS: 50.0000 mg | ORAL_TABLET | Freq: Three times a day (TID) | ORAL | Status: DC

## 2013-05-19 NOTE — Progress Notes (Addendum)
[place], [place], [place]  [address] Phone: [phone] Fax:  [phone]  Date:  05/19/2013   Patient ID:  [name], [name] [birth year], MRN [id number]   PCP:  Secundino Ginger, PA-C  Cardiologist:  Dr. Angelena Form  PV:  Dr. Gwenlyn Found    History of Present Illness: [name] is a 80 y.o. female with history of PAF s/p MAZE 2006, HTN, CVAs, PAD, sinus bradycardia who presents to clinic today as a work-in visit due to elevated blood pressure on the day of her followup lower extremity duplex. With regard to her history, she presented to [facility] 2013 with a stroke. She had been placed on Bystolic by her family doctor 10 days prior to admission. She then presented to the hospital with headaches and brain MRI showed acute 1 cm right occipital cortical infarct and remote bihemispheric infarcts. She was placed on Plavix at that time. During that admission she was seen by cardiology for sinus bradycardia at which time beta blocker was discontinued. Amlodipine was started but she developed burning in her legs so it was stopped. She was seen in followup in the office by Dr. Angelena Form who ordered event monitor to exclude AF - this showed NSR with PACs and no evidence of atrial fib. As a result she has not been on Coumadin. Last echo 04/02/11: EF 55-60%, no RWMA.   She was recently in the hospital 3/23-3/24 for planned lower extremity angiogram due to significant RLE claudication. She underwent successful balloon/stenting of an occluded right popliteal artery. She was continued on Plavix as she is reportedly intolerant to aspirin. She had issues with significant hypertension during that admission and was treated with IV NTG/hydralazine to bring her BP down. Coreg was prescribed, however, the patient was reluctant to take this due to her history of sinus bradycardia and baseline HR typically around 54. Cr was 0.93 on day of discharge (GFR 54).  She came in today for her scheduled followup PV duplex,  and reported to check-in that her BP at home had been running 209/97 so was added onto my schedule. F/u BP was 200/80. She reports that for several weeks, months even, BP has been running in the 200 range. She states it occasionally gets down to the 150s-170s, but has been running high recently. She thinks it may be related to one of her eye drop medicines that was started in November, a topical corticosteroid called Durezol. She generally feels tired but no acute symptoms including headache, chest pain, dyspnea, visual changes, vomiting, or focal neurologic changes. No weight gain, LEE, orthopnea. She sometimes has head pressure when her BP is up but this is not consistent. She reports compliance with Benicar.  Recent Labs: 05/10/2013: TSH 3.408  05/16/2013: Creatinine 0.93; Hemoglobin 11.2*; Potassium 4.1   Wt Readings from Last 3 Encounters:  05/19/13 130 lb (58.968 kg)  05/16/13 134 lb 7.7 oz (61 kg)  05/16/13 134 lb 7.7 oz (61 kg)     Past Medical History  Diagnosis Date  . Hypertension     a. Normal renal arteries by duplex 2013.  [name] PAF (paroxysmal atrial fibrillation)     a. s/p MAZE 2006. b. Event monitor 2013: NSR with PACs.  . Hypothyroidism   . PAD (peripheral artery disease)     a. 04/2013: s/p PCI to occluded right popliteal artery   . High cholesterol   . Migraine   . Stroke     a. Pt reports 3-4 prior  strokes without residual sx. b. CVA 2013 (presented with headache) - started on Plavix. Event monitor as outpt - no AF.  [name] Arthritis   . Glaucoma of both eyes   . Sinus bradycardia     a. 03/2011 during hospital admission - beta blocker stopped.    Current Outpatient Prescriptions  Medication Sig Dispense Refill  . chlorpheniramine-HYDROcodone (TUSSIONEX) 10-8 MG/5ML LQCR Take 2.5 mLs by mouth at bedtime as needed for cough.       . clopidogrel (PLAVIX) 75 MG tablet Take 75 mg by mouth daily at 12 noon.       . DUREZOL 0.05 % EMUL Place 1 drop into the right eye 2 (two)  times daily.      . Multiple Vitamin (MULTIVITAMIN WITH MINERALS) TABS tablet Take 1 tablet by mouth every morning.      . Nepafenac (ILEVRO) 0.3 % SUSP Place 1 drop into the right eye daily.      [name] olmesartan (BENICAR) 40 MG tablet Take 40 mg by mouth daily.      [name] thyroid (ARMOUR) 15 MG tablet Take 15 mg by mouth every morning.       . Combigan OP Place 1 drop into both eyes at bedtime.       No current facility-administered medications for this visit.    Allergies:   Aceon; Aspirin; and Vitamin d analogs   Social History:  The patient  reports that she has never smoked. She has never used smokeless tobacco. She reports that she does not drink alcohol or use illicit drugs.   Family History:  The patient's family history includes Hypertension in her mother.   ROS:  Please see the history of present illness.   All other systems reviewed and negative.   PHYSICAL EXAM:  VS:  BP 200/80  Pulse 60  Ht 5\' 3"  (1.6 m)  Wt 130 lb (58.968 kg)  BMI 23.03 kg/m2 Well nourished, well developed thin AAF in no acute distress HEENT: NCAT Neck: no JVD Cardiac:  normal S1, S2, + S4; RRR; no murmur Lungs:  clear to auscultation bilaterally, no wheezing, rhonchi or rales Abd: soft, nontender, no hepatomegaly Ext: no edema, L groin PV angio site with mild ecchymosis that appears to be in early stages of resolution, without bruit at that site Skin: warm and dry Neuro:  moves all extremities spontaneously, no focal abnormalities noted     ASSESSMENT AND PLAN:  1. Uncontrolled hypertension, chronic - today's BP near what she has been running for several weeks. This is corroborated by previous OV and hospitalization this month. Options are limited in the setting of prior med intolerances. She is hesitant to take any rate-lowering agents given baseline HR around 54 (and prior history of bradycardia while on Bystolic), which is reasonable. She has history of LE pain with amlodipine. She is on max dose of  ARB. She thinks she has history of cough with fluid pills, but details are unclear. Our best options are hydralazine and clonidine. In light of her history of bradycardia, have chosen to begin hydralazine 50mg  TID. The patient uses a mail order pharmacy, but would like to have a short-term RX prescribed locally. I wrote her for just over a 2 week supply since she will be following up in 1 week in case there are any dose changes at that visit. If dose is working well for her, this will need to be sent into [facility]. She will keep a log of her BPs and  bring to that OV. Warning signs associated with HTN reviewed with pt. She will contact her eye doctor to find out if there is alternative to her steroid eye drop that she attributes her elevated BP to. It is not clear that this is the cause, but she correlates increase in BPs since starting this in November. 2. Peripheral artery disease s/p stenting of occluded popliteral artery this week - doing better s/p PCI. Mild ecchymosis at groin site. Instructed pt to let us know if this does not resolve before next OV. Recheck in 1 week. Continue Plavix. 3. PAF - maintaining sinus rhythm by exam today and no recent symptoms to suggest this. Event monitor 2013 showed NSR with PACs. Dr. Angelena Form has not had her on Coumadin. She will continue to follow up with him as planned in April.  Dispo: F/u NP/PA 1 week for BP recheck.  Signed, Melina Copa, PA-C  05/19/2013 1:05 PM

## 2013-05-19 NOTE — Progress Notes (Signed)
Right Lower Extremity Arterial Duplex Completed. °Brianna L Mazza,RVT °

## 2013-05-19 NOTE — Patient Instructions (Signed)
Take Hydralazine 50 mg three times a day  Keep log of your BP's  Your physician recommends that you schedule a follow-up appointment in: one week to see either an NP or PA at either office

## 2013-05-23 ENCOUNTER — Encounter: Payer: Self-pay | Admitting: *Deleted

## 2013-05-25 ENCOUNTER — Encounter (HOSPITAL_COMMUNITY): Payer: Medicare Other

## 2013-05-26 ENCOUNTER — Ambulatory Visit: Payer: Medicare Other | Admitting: Cardiology

## 2013-06-01 DIAGNOSIS — N8189 Other female genital prolapse: Secondary | ICD-10-CM | POA: Diagnosis not present

## 2013-06-05 ENCOUNTER — Encounter: Payer: Self-pay | Admitting: Cardiovascular Disease

## 2013-06-05 ENCOUNTER — Ambulatory Visit (INDEPENDENT_AMBULATORY_CARE_PROVIDER_SITE_OTHER): Payer: Medicare Other | Admitting: Cardiovascular Disease

## 2013-06-05 VITALS — BP 182/80 | HR 51 | Ht 63.0 in | Wt 133.2 lb

## 2013-06-05 DIAGNOSIS — I4891 Unspecified atrial fibrillation: Secondary | ICD-10-CM

## 2013-06-05 DIAGNOSIS — I739 Peripheral vascular disease, unspecified: Secondary | ICD-10-CM

## 2013-06-05 DIAGNOSIS — I10 Essential (primary) hypertension: Secondary | ICD-10-CM

## 2013-06-05 DIAGNOSIS — I480 Paroxysmal atrial fibrillation: Secondary | ICD-10-CM

## 2013-06-05 MED ORDER — CLONIDINE HCL 0.1 MG PO TABS: 0.1000 mg | ORAL_TABLET | Freq: Two times a day (BID) | ORAL | Status: DC

## 2013-06-05 NOTE — Patient Instructions (Signed)
Your physician recommends that you schedule a follow-up appointment in:  3-4 weeks with NP or PA.   Your physician has recommended you make the following change in your medication:  Start Clonidine 0.1 mg by mouth twice daily

## 2013-06-05 NOTE — Progress Notes (Signed)
History of Present Illness: 80 yo AAF with history of HTN, CVA, PAF s/p MAZE 2006, PAD who is here today for cardiac follow up. She was admitted to [facility] on 04/01/11 with a headache, elevated BP and sinus bradycardia. She saw her family physician about 10 day prior to admission and was started on Bystolic for better blood pressure control. She had been compliant with this medication but began to have headaches and came into the ED. Head CT was non-specific. I saw her as a Optometrist for bradycardia and we held her beta blocker. Brain MRI on 04/02/11 showed acute 1 cm right occipital cortical infarct without hemorrhage. No mass lesion or hydrocephalus. Atrophy and small vessel disease. Remote bihemispheric infarcts. She was seen by Neurology and given her history of remote infarcts and history of PAF in the past before admission, recommendation was made an event monitor to exclude recurrence of atrial fibrillation. She was started on Plavix in the hospital. Her beta blocker was held and an ARB was started. She was started on Norvasc in the hospital but had burning in her legs so she stopped it. Echo 04/02/11 with normal LV size and function. I saw her here in the office March 2013 and ordered an event monitor to exclude PAF. Event monitor showed NSR with PACs.No evidence of atrial fib. Renal artery dopplers November 2013 without evidence of renal artery stenosis. I started HCTZ in October 2013 but she did not want to try this medication. She was recently in the hospital 3/23-3/24/15 for planned lower extremity angiogram due to significant RLE claudication. She underwent successful balloon/stenting of an occluded right popliteal artery per Dr. Gwenlyn Found. She was continued on Plavix as she is intolerant to aspirin. She had issues with significant hypertension during that admission and was treated with IV NTG/hydralazine to bring her BP down. Coreg was prescribed, however, the patient was reluctant to take  this due to her history of sinus bradycardia and baseline HR typically around 54. She was seen by Melina Copa, PA-C on 05/19/13 in the office for elevated BP. She reported compliance with Benicar. She was started on Hydralazine 50 mg po TID.   She is here today for follow up. She reports having headaches when starting the Hydralazine. It also made her feel dizzy so she stopped it. No chest pain or SOB. No palpitations. Feeling well.   Primary Care Physician: Toney Reil in [facility]  Last Lipid Profile:Lipid Panel     Component Value Date/Time   CHOL 233* 04/03/2011 0614   TRIG 85 04/03/2011 0614   HDL 73 04/03/2011 0614   CHOLHDL 3.2 04/03/2011 0614   VLDL 17 04/03/2011 0614   LDLCALC 143* 04/03/2011 [id number]     Past Medical History  Diagnosis Date  . Hypertension     a. Normal renal arteries by duplex 2013.  [name] PAF (paroxysmal atrial fibrillation)     a. s/p MAZE 2006. b. Event monitor 2013: NSR with PACs.  . Hypothyroidism   . PAD (peripheral artery disease)     a. 04/2013: s/p PCI to occluded right popliteal artery   . High cholesterol   . Migraine   . Stroke     a. Pt reports 3-4 prior strokes without residual sx. b. CVA 2013 (presented with headache) - started on Plavix. Event monitor as outpt - no AF.  [name] Arthritis   . Glaucoma of both eyes   . Sinus bradycardia     a. 03/2011 during hospital  admission - beta blocker stopped.    Past Surgical History  Procedure Laterality Date  . Maze  ~ 2006  . Popliteal artery stent  05/15/2013    Current Outpatient Prescriptions  Medication Sig Dispense Refill  . Brimonidine Tartrate-Timolol (COMBIGAN OP) Apply 1 drop to eye at bedtime.      . chlorpheniramine-HYDROcodone (TUSSIONEX) 10-8 MG/5ML LQCR Take 2.5 mLs by mouth 2 (two) times daily as needed for cough.       . clopidogrel (PLAVIX) 75 MG tablet Take 75 mg by mouth daily at 12 noon.       . DUREZOL 0.05 % EMUL Place 1 drop into the right eye 2 (two) times daily.      . Multiple Vitamin  (MULTIVITAMIN WITH MINERALS) TABS tablet Take 1 tablet by mouth every morning.      . Nepafenac (ILEVRO) 0.3 % SUSP Place 1 drop into the right eye at bedtime.       [name] olmesartan (BENICAR) 40 MG tablet Take 40 mg by mouth daily.      [name] thyroid (ARMOUR) 15 MG tablet Take 15 mg by mouth every morning.        No current facility-administered medications for this visit.    Allergies  Allergen Reactions  . Aceon [Perindopril Erbumine]     cough  . Amlodipine     Lower extremity discomfort  . Aspirin Cough  . Beta Adrenergic Blockers     Bradycardia  . Hydralazine Nausea And Vomiting and Other (See Comments)    Weakness   . Vitamin D Analogs Cough    History   Social History  . Marital Status: Married    Spouse Name: N/A    Number of Children: N/A  . Years of Education: N/A   Occupational History  . Not on file.   Social History Main Topics  . Smoking status: Never Smoker   . Smokeless tobacco: Never Used  . Alcohol Use: No  . Drug Use: No  . Sexual Activity: No   Other Topics Concern  . Not on file   Social History Narrative  . No narrative on file    Family History  Problem Relation Age of Onset  . Hypertension Mother     Review of Systems:  As stated in the HPI and otherwise negative.   BP 182/80  Pulse 51  Ht 5\' 3"  (1.6 m)  Wt 133 lb 3.2 oz (60.419 kg)  BMI 23.60 kg/m2  Physical Examination: General: Well developed, well nourished, NAD HEENT: OP clear, mucus membranes moist SKIN: warm, dry. No rashes. Neuro: No focal deficits Musculoskeletal: Muscle strength 5/5 all ext Psychiatric: Mood and affect normal Neck: No JVD, no carotid bruits, no thyromegaly, no lymphadenopathy. Lungs:Clear bilaterally, no wheezes, rhonci, crackles Cardiovascular: Regular rate and rhythm. No murmurs, gallops or rubs. Abdomen:Soft. Bowel sounds present. Non-tender.  Extremities: No lower extremity edema. Pulses are 2 + in the bilateral DP/PT.  Assessment and Plan:    1. CVA: No evidence of atrial fibrillation on event monitor March 2013. She is on Plavix for CVA. No indication now for coumadin.   2. Premature atrial contraction: No symptoms. No changes.   3. HTN: BP still elevated. She has been on Benicar at max dose. She has not tolerated beta blocker, HCTZ, Norvasc, Hydralazine. Will start Clonidine 0.1 mg po BID. I suspect that she may also not tolerate this medication. If she does not tolerate, may not have any other good options. Will see her  back in several weeks to check BP   4. PAD: Stable. Recent PTA/stent right popliteal artery by Dr. Gwenlyn Found.

## 2013-06-07 DIAGNOSIS — H4010X0 Unspecified open-angle glaucoma, stage unspecified: Secondary | ICD-10-CM | POA: Diagnosis not present

## 2013-06-07 DIAGNOSIS — H2510 Age-related nuclear cataract, unspecified eye: Secondary | ICD-10-CM | POA: Diagnosis not present

## 2013-06-07 DIAGNOSIS — Z961 Presence of intraocular lens: Secondary | ICD-10-CM | POA: Diagnosis not present

## 2013-06-07 DIAGNOSIS — H2000 Unspecified acute and subacute iridocyclitis: Secondary | ICD-10-CM | POA: Diagnosis not present

## 2013-06-12 ENCOUNTER — Encounter: Payer: Self-pay | Admitting: Cardiovascular Disease

## 2013-06-12 ENCOUNTER — Ambulatory Visit (INDEPENDENT_AMBULATORY_CARE_PROVIDER_SITE_OTHER): Payer: Medicare Other | Admitting: Cardiovascular Disease

## 2013-06-12 VITALS — BP 190/72 | HR 56 | Ht 63.0 in | Wt 136.5 lb

## 2013-06-12 DIAGNOSIS — I739 Peripheral vascular disease, unspecified: Secondary | ICD-10-CM

## 2013-06-12 DIAGNOSIS — I70229 Atherosclerosis of native arteries of extremities with rest pain, unspecified extremity: Secondary | ICD-10-CM

## 2013-06-12 DIAGNOSIS — I999 Unspecified disorder of circulatory system: Secondary | ICD-10-CM

## 2013-06-12 DIAGNOSIS — I998 Other disorder of circulatory system: Secondary | ICD-10-CM

## 2013-06-12 NOTE — Assessment & Plan Note (Signed)
The patient was referred to me by [name] . PA-C from [place] [facility] for critical limb ischemia. Her initial lower extremity Doppler study performed 05/10/13 revealed a right ABI of 0.48 with an occluded right popliteal artery. Angiogram for 5 days later revealing an occluded popliteal artery and was able to percutaneously revascularize this using a chocolate balloon and an IDEV  stent. There was one vessel runoff via the anterior tibial artery. The tibioperoneal trunk was occluded. A followup Doppler performed 05/19/13 revealed a right ABI 1.1. Her symptoms have resolved.

## 2013-06-12 NOTE — Patient Instructions (Signed)
Follow up with Dr Gwenlyn Found as needed.    We will do dopplers of your lower extremities every 6 months.

## 2013-06-12 NOTE — Progress Notes (Signed)
06/12/2013 [name]   1926/01/06  [id number]  Primary Physician Amanda Ginger, PA-C Primary Cardiologist: Amanda Harp MD [name]   HPI:  [name] is a delightful 79 year old married African American female mother of 4 children, grandmother of 7 grandchildren who was referred by Amanda Cowman PA-C at [facility] for critical limb ischemia. Her past medical history is remarkable for paroxysmal atrial fibrillation and hypertension. She has had strokes in the past and is on Plavix. She developed right calf pain in early March and was referred here for further evaluation. Arterial Doppler studies revealed a right ABI of 0.48  with an occluded right popliteal artery. Based on this I angiogrammed her confirming this and performed PTA and stenting with reconstitution of the popliteal artery, resolution of her claudication symptoms and improvement in her arterial Doppler studies of the right ABI 1.1.   Current Outpatient Prescriptions  Medication Sig Dispense Refill  . Brimonidine Tartrate-Timolol (COMBIGAN OP) Apply 1 drop to eye at bedtime.      . chlorpheniramine-HYDROcodone (TUSSIONEX) 10-8 MG/5ML LQCR Take 2.5 mLs by mouth 2 (two) times daily as needed for cough.       . cloNIDine (CATAPRES) 0.1 MG tablet Take 1 tablet (0.1 mg total) by mouth 2 (two) times daily.  60 tablet  6  . clopidogrel (PLAVIX) 75 MG tablet Take 75 mg by mouth daily at 12 noon.       . DUREZOL 0.05 % EMUL Place 1 drop into the right eye 2 (two) times daily.      . Multiple Vitamin (MULTIVITAMIN WITH MINERALS) TABS tablet Take 1 tablet by mouth every morning.      . Nepafenac (ILEVRO) 0.3 % SUSP Place 1 drop into the right eye at bedtime.       [name] olmesartan (BENICAR) 40 MG tablet Take 40 mg by mouth daily.      [name] thyroid (ARMOUR) 15 MG tablet Take 15 mg by mouth every morning.        No current facility-administered medications for this visit.    Allergies  Allergen Reactions  .  Aceon [Perindopril Erbumine]     cough  . Amlodipine     Lower extremity discomfort  . Aspirin Cough  . Beta Adrenergic Blockers     Bradycardia  . Hydralazine Nausea And Vomiting and Other (See Comments)    Weakness   . Vitamin D Analogs Cough    History   Social History  . Marital Status: Married    Spouse Name: N/A    Number of Children: N/A  . Years of Education: N/A   Occupational History  . Not on file.   Social History Main Topics  . Smoking status: Never Smoker   . Smokeless tobacco: Never Used  . Alcohol Use: No  . Drug Use: No  . Sexual Activity: No   Other Topics Concern  . Not on file   Social History Narrative  . No narrative on file     Review of Systems: General: negative for chills, fever, night sweats or weight changes.  Cardiovascular: negative for chest pain, dyspnea on exertion, edema, orthopnea, palpitations, paroxysmal nocturnal dyspnea or shortness of breath Dermatological: negative for rash Respiratory: negative for cough or wheezing Urologic: negative for hematuria Abdominal: negative for nausea, vomiting, diarrhea, bright red blood per rectum, melena, or hematemesis Neurologic: negative for visual changes, syncope, or dizziness All other systems reviewed and are otherwise negative except as noted above.  Blood pressure 190/72, pulse 56, height 5\' 3"  (1.6 m), weight 136 lb 8 oz (61.916 kg).  General appearance: alert and no distress Neck: no adenopathy, no carotid bruit, no JVD, supple, symmetrical, trachea midline and thyroid not enlarged, symmetric, no tenderness/mass/nodules Lungs: clear to auscultation bilaterally Heart: regular rate and rhythm, S1, S2 normal, no murmur, click, rub or gallop Extremities: extremities normal, atraumatic, no cyanosis or edema and her left femoral arterial puncture site is well-healed. She has a 2+ right dorsalis pedis pulse  EKG not performed today  ASSESSMENT AND PLAN:   Critical lower limb  ischemia The patient was referred to me by [name] . PA-C from [facility] for critical limb ischemia. Her initial lower extremity Doppler study performed 05/10/13 revealed a right ABI of 0.48 with an occluded right popliteal artery. Angiogram for 5 days later revealing an occluded popliteal artery and was able to percutaneously revascularize this using a chocolate balloon and an IDEV  stent. There was one vessel runoff via the anterior tibial artery. The tibioperoneal trunk was occluded. A followup Doppler performed 05/19/13 revealed a right ABI 1.1. Her symptoms have resolved.      Amanda Harp MD FACP,FACC,FAHA, [facility] 06/12/2013 3:06 PM

## 2013-06-19 DIAGNOSIS — I498 Other specified cardiac arrhythmias: Secondary | ICD-10-CM | POA: Diagnosis not present

## 2013-06-19 DIAGNOSIS — R059 Cough, unspecified: Secondary | ICD-10-CM | POA: Diagnosis not present

## 2013-06-19 DIAGNOSIS — I10 Essential (primary) hypertension: Secondary | ICD-10-CM | POA: Diagnosis not present

## 2013-06-19 DIAGNOSIS — R05 Cough: Secondary | ICD-10-CM | POA: Diagnosis not present

## 2013-06-23 ENCOUNTER — Ambulatory Visit
Admission: RE | Admit: 2013-06-23 | Discharge: 2013-06-23 | Disposition: A | Discharge status: Home / Self Care | Payer: Medicare Other | Source: Ambulatory Visit

## 2013-06-23 ENCOUNTER — Encounter (INDEPENDENT_AMBULATORY_CARE_PROVIDER_SITE_OTHER): Payer: Self-pay

## 2013-06-23 DIAGNOSIS — Z1231 Encounter for screening mammogram for malignant neoplasm of breast: Secondary | ICD-10-CM

## 2013-06-26 ENCOUNTER — Ambulatory Visit: Payer: Medicare Other | Admitting: Physician Assistant

## 2013-07-07 DIAGNOSIS — N8189 Other female genital prolapse: Secondary | ICD-10-CM | POA: Diagnosis not present

## 2013-07-08 LAB — HM MAMMOGRAPHY: HM MAMMO: NORMAL

## 2013-07-10 ENCOUNTER — Ambulatory Visit (INDEPENDENT_AMBULATORY_CARE_PROVIDER_SITE_OTHER): Payer: Medicare Other | Admitting: Physician Assistant

## 2013-07-10 ENCOUNTER — Encounter: Payer: Self-pay | Admitting: Physician Assistant

## 2013-07-10 VITALS — BP 156/70 | HR 58 | Ht 63.0 in | Wt 133.0 lb

## 2013-07-10 DIAGNOSIS — I4891 Unspecified atrial fibrillation: Secondary | ICD-10-CM | POA: Diagnosis not present

## 2013-07-10 DIAGNOSIS — I498 Other specified cardiac arrhythmias: Secondary | ICD-10-CM

## 2013-07-10 DIAGNOSIS — I10 Essential (primary) hypertension: Secondary | ICD-10-CM | POA: Diagnosis not present

## 2013-07-10 DIAGNOSIS — I480 Paroxysmal atrial fibrillation: Secondary | ICD-10-CM

## 2013-07-10 DIAGNOSIS — R001 Bradycardia, unspecified: Secondary | ICD-10-CM

## 2013-07-10 NOTE — Progress Notes (Signed)
HPI: This is an 80 year old African American female patient of Dr. Angelena Form, Dr.Berry, & Isaias Cowman, PA-C. She is here today for blood pressure followup. She is intolerant to multiple medications including beta blocker, hydrochlorothiazide, and Norvasc, and hydralazine. Dr. Angelena Form started her on catapress 0.1 mg twice a day but she became dizzy on this and her heart rate was in the 40's. She then saw Isaias Cowman PAC back who decreased her clonidine to 0.05 mg once daily and added Azor 5-40 mg daily. She now complains of a cough that she is trying to control with tussinex. She also complains of ankle edema throughout the day since being started on Azor. Her blood pressure is actually below better on this. She says it's usually [id number] systolic at home. She does admit to getting salt in her diet. She doesn't add salt but eats foods with a lot of salt in them and frequents restaurants that have high salt content.  Patient also has history of CVA, PAF status post maze in 2006, and PAD.Echo 04/02/11 with normal LV size and function.Renal artery dopplers November 2013 without evidence of renal artery stenosis.She was in the hospital 3/23-3/24/15 for planned lower extremity angiogram due to significant RLE claudication. She underwent successful balloon/stenting of an occluded right popliteal artery per Dr. Gwenlyn Found.    Allergies: -- Aceon [Perindopril Erbumine]    --  cough  -- Amlodipine    --  Lower extremity discomfort  -- Aspirin -- Cough  -- Beta Adrenergic Blockers    --  Bradycardia  -- Hydralazine -- Nausea And Vomiting and Other (See                           Comments)   --  Weakness  -- Vitamin D Analogs -- Cough  Current Outpatient Prescriptions on File Prior to Visit: Brimonidine Tartrate-Timolol (COMBIGAN OP), Apply 1 drop to eye at bedtime., Disp: , Rfl:  chlorpheniramine-HYDROcodone (TUSSIONEX) 10-8 MG/5ML LQCR, Take 2.5 mLs by mouth 2 (two) times daily as needed for cough. , Disp: , Rfl:   clopidogrel (PLAVIX) 75 MG tablet, Take 75 mg by mouth daily at 12 noon. , Disp: , Rfl:  Multiple Vitamin (MULTIVITAMIN WITH MINERALS) TABS tablet, Take 1 tablet by mouth every morning., Disp: , Rfl:  Nepafenac (ILEVRO) 0.3 % SUSP, Place 1 drop into the right eye at bedtime. , Disp: , Rfl:  olmesartan (BENICAR) 40 MG tablet, Take 40 mg by mouth daily., Disp: , Rfl:   thyroid (ARMOUR) 15 MG tablet, Take 15 mg by mouth every morning. , Disp: , Rfl:   No current facility-administered medications on file prior to visit.   Past Medical History:   Hypertension                                                   Comment:a. Normal renal arteries by duplex 2013.   PAF (paroxysmal atrial fibrillation)                           Comment:a. s/p MAZE 2006. b. Event monitor 2013: NSR               with PACs.   Hypothyroidism  PAD (peripheral artery disease)                                Comment:a. 04/2013: s/p PCI to occluded right popliteal               artery    High cholesterol                                             Migraine                                                     Stroke                                                         Comment:a. Pt reports 3-4 prior strokes without               residual sx. b. CVA 2013 (presented with               headache) - started on Plavix. Event monitor as              outpt - no AF.   Arthritis                                                    Glaucoma of both eyes                                        Sinus bradycardia                                              Comment:a. 03/2011 during hospital admission - beta               blocker stopped.  Past Surgical History:   MAZE                                             ~ 2006       POPLITEAL ARTERY STENT                           05/15/2013   Review of patient's family history indicates:   Hypertension                   Mother                    Social History   Marital Status: Married  Spouse Name:                      Years of Education:                 Number of children:             Occupational History   None on file  Social History Main Topics   Smoking Status: Never Smoker                     Smokeless Status: Never Used                       Alcohol Use: No             Drug Use: No             Sexual Activity: No                 Other Topics            Concern   None on file  Social History Narrative   None on file    ROS: See history of present illness otherwise negative   PHYSICAL EXAM: Well-nournished, in no acute distress. Neck: No JVD, HJR, Bruit, or thyroid enlargement  Lungs: No tachypnea, clear without wheezing, rales, or rhonchi  Cardiovascular: RRR, PMI not displaced, heart sounds distant, positive S4, no murmurs, bruit, thrill, or heave.  Abdomen: BS normal. Soft without organomegaly, masses, lesions or tenderness.  Extremities: Trace of left ankle edema otherwise lower extremities without cyanosis, clubbing. Good distal pulses bilateral  SKin: Warm, no lesions or rashes   Musculoskeletal: No deformities  Neuro: no focal signs  BP 156/70  Pulse 58  Ht 5\' 3"  (1.6 m)  Wt 133 lb (60.328 kg)  BMI 23.57 kg/m2   EKG: Sinus bradycardia 48 beats per minute with LVH and T wave inversion inferior anterior and lateral a little more pronounced than prior EKG

## 2013-07-10 NOTE — Assessment & Plan Note (Signed)
Can't increase clonidine because of this

## 2013-07-10 NOTE — Assessment & Plan Note (Signed)
Patient's blood pressure has actually come down some on current medications. I cannot increase the clonidine because of bradycardia. She is having side effects from the Azor but she seems to be tolerating them at this time. I've recommended 2 g sodium diet with her and given her a copy of 1. She will followup with Dr. Lonna Cobb & Isaias Cowman PA-C for further treatment.

## 2013-07-10 NOTE — Patient Instructions (Addendum)
Your physician recommends that you continue on your current medications as directed. Please refer to the Current Medication list given to you today.   Your physician recommends that you schedule a follow-up appointment  2 months in with Dr. Angelena Form   2 Gram Low Sodium Diet A 2 gram sodium diet restricts the amount of sodium in the diet to no more than 2 g or 2000 mg daily. Limiting the amount of sodium is often used to help lower blood pressure. It is important if you have heart, liver, or kidney problems. Many foods contain sodium for flavor and sometimes as a preservative. When the amount of sodium in a diet needs to be low, it is important to know what to look for when choosing foods and drinks. The following includes some information and guidelines to help make it easier for you to adapt to a low sodium diet. QUICK TIPS  Do not add salt to food.  Avoid convenience items and fast food.  Choose unsalted snack foods.  Buy lower sodium products, often labeled as "lower sodium" or "no salt added."  Check food labels to learn how much sodium is in 1 serving.  When eating at a restaurant, ask that your food be prepared with less salt or none, if possible. READING FOOD LABELS FOR SODIUM INFORMATION The nutrition facts label is a good place to find how much sodium is in foods. Look for products with no more than 500 to 600 mg of sodium per meal and no more than 150 mg per serving. Remember that 2 g = 2000 mg. The food label may also list foods as:  Sodium-free: Less than 5 mg in a serving.  Very low sodium: 35 mg or less in a serving.  Low-sodium: 140 mg or less in a serving.  Light in sodium: 50% less sodium in a serving. For example, if a food that usually has 300 mg of sodium is changed to become light in sodium, it will have 150 mg of sodium.  Reduced sodium: 25% less sodium in a serving. For example, if a food that usually has 400 mg of sodium is changed to reduced sodium, it will  have 300 mg of sodium. CHOOSING FOODS Grains  Avoid: Salted crackers and snack items. Some cereals, including instant hot cereals. Bread stuffing and biscuit mixes. Seasoned rice or pasta mixes.  Choose: Unsalted snack items. Low-sodium cereals, oats, puffed wheat and rice, shredded wheat. English muffins and bread. Pasta. Meats  Avoid: Salted, canned, smoked, spiced, pickled meats, including fish and poultry. Bacon, ham, sausage, cold cuts, hot dogs, anchovies.  Choose: Low-sodium canned tuna and salmon. Fresh or frozen meat, poultry, and fish. Dairy  Avoid: Processed cheese and spreads. Cottage cheese. Buttermilk and condensed milk. Regular cheese.  Choose: Milk. Low-sodium cottage cheese. Yogurt. Sour cream. Low-sodium cheese. Fruits and Vegetables  Avoid: Regular canned vegetables. Regular canned tomato sauce and paste. Frozen vegetables in sauces. Olives. Angie Fava. Relishes. Sauerkraut.  Choose: Low-sodium canned vegetables. Low-sodium tomato sauce and paste. Frozen or fresh vegetables. Fresh and frozen fruit. Condiments  Avoid: Canned and packaged gravies. Worcestershire sauce. Tartar sauce. Barbecue sauce. Soy sauce. Steak sauce. Ketchup. Onion, garlic, and table salt. Meat flavorings and tenderizers.  Choose: Fresh and dried herbs and spices. Low-sodium varieties of mustard and ketchup. Lemon juice. Tabasco sauce. Horseradish. SAMPLE 2 GRAM SODIUM MEAL PLAN Breakfast / Sodium (mg)  1 cup low-fat milk / [id number] mg  2 slices whole-wheat toast / 270 mg  1 tbs  heart-healthy margarine / 153 mg  1 hard-boiled egg / 139 mg  1 small orange / 0 mg Lunch / Sodium (mg)  1 cup raw carrots / 76 mg   cup hummus / 298 mg  1 cup low-fat milk / 143 mg   cup red grapes / 2 mg  1 whole-wheat pita bread / 356 mg Dinner / Sodium (mg)  1 cup whole-wheat pasta / 2 mg  1 cup low-sodium tomato sauce / 73 mg  3 oz lean ground beef / 57 mg  1 small side salad (1 cup raw spinach  leaves,  cup cucumber,  cup yellow bell pepper) with 1 tsp olive oil and 1 tsp red wine vinegar / 25 mg Snack / Sodium (mg)  1 container low-fat vanilla yogurt / 107 mg  3 graham cracker squares / 127 mg Nutrient Analysis  Calories: 2033  Protein: 77 g  Carbohydrate: 282 g  Fat: 72 g  Sodium: 1971 mg Document Released: 02/09/2005 Document Revised: 05/04/2011 Document Reviewed: 05/13/2009 Exit[employer] Patient Information 2014 [place], [place].

## 2013-07-18 DIAGNOSIS — R05 Cough: Secondary | ICD-10-CM | POA: Diagnosis not present

## 2013-07-18 DIAGNOSIS — I10 Essential (primary) hypertension: Secondary | ICD-10-CM | POA: Diagnosis not present

## 2013-07-18 DIAGNOSIS — R059 Cough, unspecified: Secondary | ICD-10-CM | POA: Diagnosis not present

## 2013-07-18 DIAGNOSIS — I359 Nonrheumatic aortic valve disorder, unspecified: Secondary | ICD-10-CM | POA: Diagnosis not present

## 2013-07-31 DIAGNOSIS — H2000 Unspecified acute and subacute iridocyclitis: Secondary | ICD-10-CM | POA: Diagnosis not present

## 2013-08-02 DIAGNOSIS — I359 Nonrheumatic aortic valve disorder, unspecified: Secondary | ICD-10-CM | POA: Diagnosis not present

## 2013-08-07 ENCOUNTER — Ambulatory Visit
Admission: RE | Admit: 2013-08-07 | Discharge: 2013-08-07 | Disposition: A | Discharge status: Home / Self Care | Payer: Medicare Other | Source: Ambulatory Visit | Attending: Ophthalmology | Admitting: Ophthalmology

## 2013-08-07 ENCOUNTER — Other Ambulatory Visit: Payer: Self-pay | Admitting: Ophthalmology

## 2013-08-07 DIAGNOSIS — H2000 Unspecified acute and subacute iridocyclitis: Secondary | ICD-10-CM | POA: Diagnosis not present

## 2013-08-07 DIAGNOSIS — H2510 Age-related nuclear cataract, unspecified eye: Secondary | ICD-10-CM | POA: Diagnosis not present

## 2013-08-07 DIAGNOSIS — H209 Unspecified iridocyclitis: Secondary | ICD-10-CM

## 2013-08-07 DIAGNOSIS — H4010X0 Unspecified open-angle glaucoma, stage unspecified: Secondary | ICD-10-CM | POA: Diagnosis not present

## 2013-08-07 DIAGNOSIS — Z961 Presence of intraocular lens: Secondary | ICD-10-CM | POA: Diagnosis not present

## 2013-08-07 DIAGNOSIS — R9389 Abnormal findings on diagnostic imaging of other specified body structures: Secondary | ICD-10-CM | POA: Diagnosis not present

## 2013-08-08 DIAGNOSIS — N8189 Other female genital prolapse: Secondary | ICD-10-CM | POA: Diagnosis not present

## 2013-08-11 DIAGNOSIS — H2000 Unspecified acute and subacute iridocyclitis: Secondary | ICD-10-CM | POA: Diagnosis not present

## 2013-08-18 DIAGNOSIS — I2789 Other specified pulmonary heart diseases: Secondary | ICD-10-CM | POA: Diagnosis not present

## 2013-08-18 DIAGNOSIS — I359 Nonrheumatic aortic valve disorder, unspecified: Secondary | ICD-10-CM | POA: Diagnosis not present

## 2013-08-18 DIAGNOSIS — I079 Rheumatic tricuspid valve disease, unspecified: Secondary | ICD-10-CM | POA: Diagnosis not present

## 2013-08-18 DIAGNOSIS — I10 Essential (primary) hypertension: Secondary | ICD-10-CM | POA: Diagnosis not present

## 2013-09-14 DIAGNOSIS — H2000 Unspecified acute and subacute iridocyclitis: Secondary | ICD-10-CM | POA: Diagnosis not present

## 2013-09-22 ENCOUNTER — Encounter: Payer: Self-pay | Admitting: Cardiovascular Disease

## 2013-09-22 ENCOUNTER — Ambulatory Visit (INDEPENDENT_AMBULATORY_CARE_PROVIDER_SITE_OTHER): Payer: Medicare Other | Admitting: Cardiovascular Disease

## 2013-09-22 VITALS — BP 118/60 | HR 56 | Ht 63.0 in | Wt 135.0 lb

## 2013-09-22 DIAGNOSIS — I739 Peripheral vascular disease, unspecified: Secondary | ICD-10-CM | POA: Diagnosis not present

## 2013-09-22 DIAGNOSIS — I10 Essential (primary) hypertension: Secondary | ICD-10-CM

## 2013-09-22 DIAGNOSIS — I491 Atrial premature depolarization: Secondary | ICD-10-CM | POA: Diagnosis not present

## 2013-09-22 NOTE — Patient Instructions (Signed)
Your physician wants you to follow-up in:  12 months.  You will receive a reminder letter in the mail two months in advance. If you don't receive a letter, please call our office to schedule the follow-up appointment.   

## 2013-09-22 NOTE — Progress Notes (Signed)
History of Present Illness: 80 yo AAF with history of HTN, CVA, PAF s/p MAZE 2006, PAD who is here today for cardiac follow up. She was admitted to [facility] on 04/01/11 with a headache, elevated BP and sinus bradycardia. She saw her family physician about 10 day prior to admission and was started on Bystolic for better blood pressure control. She had been compliant with this medication but began to have headaches and came into the ED. Head CT was non-specific. I saw her as a Optometrist for bradycardia and we held her beta blocker. Brain MRI on 04/02/11 showed acute 1 cm right occipital cortical infarct without hemorrhage. No mass lesion or hydrocephalus. Atrophy and small vessel disease. Remote bihemispheric infarcts. She was seen by Neurology and given her history of remote infarcts and history of PAF in the past before admission, recommendation was made an event monitor to exclude recurrence of atrial fibrillation. She was started on Plavix in the hospital. Her beta blocker was held and an ARB was started. She was started on Norvasc in the hospital but had burning in her legs so she stopped it. Echo 04/02/11 with normal LV size and function. I saw her here in the office March 2013 and ordered an event monitor to exclude PAF. Event monitor showed NSR with PACs.No evidence of atrial fib. Renal artery dopplers November 2013 without evidence of renal artery stenosis. I started HCTZ in October 2013 but she did not want to try this medication. She was was admitted 3/23-3/24/15 for planned lower extremity angiogram due to significant RLE claudication. She underwent successful balloon/stenting of an occluded right popliteal artery per Dr. Gwenlyn Found. She was continued on Plavix as she is intolerant to aspirin. She had issues with significant hypertension during that admission and was treated with IV NTG/hydralazine to bring her BP down. Coreg was prescribed, however, the patient was reluctant to take this due to  her history of sinus bradycardia and baseline HR typically around 54. She was seen by Melina Copa, PA-C on 05/19/13 in the office for elevated BP. She reported compliance with Benicar. She was started on Hydralazine 50 mg po TID but she stopped this due to dizziness. I started clonidine 0.1mg  po BID but she had more bradycardia so Azor was added in primary care. She was seen in f/u May 2015 in our office by Estella Husk, PA-C and c/o cough and ankle edema on Azor.   She is here today for follow up. No chest pain or SOB. No palpitations. Feeling well.   Primary Care Physician: Toney Reil in [facility].  Last Lipid Profile:Lipid Panel     Component Value Date/Time   CHOL 233* 04/03/2011 0614   TRIG 85 04/03/2011 0614   HDL 73 04/03/2011 0614   CHOLHDL 3.2 04/03/2011 0614   VLDL 17 04/03/2011 0614   LDLCALC 143* 04/03/2011 [id number]     Past Medical History  Diagnosis Date  . Hypertension     a. Normal renal arteries by duplex 2013.  [name] PAF (paroxysmal atrial fibrillation)     a. s/p MAZE 2006. b. Event monitor 2013: NSR with PACs.  . Hypothyroidism   . PAD (peripheral artery disease)     a. 04/2013: s/p PCI to occluded right popliteal artery   . High cholesterol   . Migraine   . Stroke     a. Pt reports 3-4 prior strokes without residual sx. b. CVA 2013 (presented with headache) - started on Plavix. Event monitor as  outpt - no AF.  [name] Arthritis   . Glaucoma of both eyes   . Sinus bradycardia     a. 03/2011 during hospital admission - beta blocker stopped.    Past Surgical History  Procedure Laterality Date  . Maze  ~ 2006  . Popliteal artery stent  05/15/2013    Current Outpatient Prescriptions  Medication Sig Dispense Refill  . amLODipine-olmesartan (AZOR) 5-40 MG per tablet Take 1 tablet by mouth daily.      . Brimonidine Tartrate-Timolol (COMBIGAN OP) Apply 1 drop to eye at bedtime.      . chlorpheniramine-HYDROcodone (TUSSIONEX) 10-8 MG/5ML LQCR Take 2.5 mLs by mouth 2 (two) times daily  as needed for cough.       . cloNIDine (CATAPRES) 0.1 MG tablet Take 0.05 mg by mouth daily.      . clopidogrel (PLAVIX) 75 MG tablet Take 75 mg by mouth daily at 12 noon.       . Multiple Vitamin (MULTIVITAMIN WITH MINERALS) TABS tablet Take 1 tablet by mouth every morning.      . Nepafenac (ILEVRO) 0.3 % SUSP Place 1 drop into the right eye at bedtime.       [name] olmesartan (BENICAR) 40 MG tablet Take 40 mg by mouth daily.      [name] thyroid (ARMOUR) 15 MG tablet Take 15 mg by mouth every morning.        No current facility-administered medications for this visit.    Allergies  Allergen Reactions  . Aceon [Perindopril Erbumine]     cough  . Amlodipine     Lower extremity discomfort  . Aspirin Cough  . Beta Adrenergic Blockers     Bradycardia  . Hydralazine Nausea And Vomiting and Other (See Comments)    Weakness   . Vitamin D Analogs Cough    History   Social History  . Marital Status: Married    Spouse Name: N/A    Number of Children: N/A  . Years of Education: N/A   Occupational History  . Not on file.   Social History Main Topics  . Smoking status: Never Smoker   . Smokeless tobacco: Never Used  . Alcohol Use: No  . Drug Use: No  . Sexual Activity: No   Other Topics Concern  . Not on file   Social History Narrative  . No narrative on file    Family History  Problem Relation Age of Onset  . Hypertension Mother     Review of Systems:  As stated in the HPI and otherwise negative.   BP 118/60  Pulse 56  Ht 5\' 3"  (1.6 m)  Wt 135 lb (61.236 kg)  BMI 23.92 kg/m2  SpO2 97%  Physical Examination: General: Well developed, well nourished, NAD HEENT: OP clear, mucus membranes moist SKIN: warm, dry. No rashes. Neuro: No focal deficits Musculoskeletal: Muscle strength 5/5 all ext Psychiatric: Mood and affect normal Neck: No JVD, no carotid bruits, no thyromegaly, no lymphadenopathy. Lungs:Clear bilaterally, no wheezes, rhonci, crackles Cardiovascular: Regular  rate and rhythm. No murmurs, gallops or rubs. Abdomen:Soft. Bowel sounds present. Non-tender.  Extremities: No lower extremity edema. Pulses are 2 + in the bilateral DP/PT.  Echo 08/02/13: Normal LV function, LVEF=55-60%. Trace TR  Assessment and Plan:   1. CVA: No evidence of atrial fibrillation on event monitor March 2013. She is on Plavix for CVA. No indication now for coumadin.   2. Premature atrial contraction: No symptoms. No changes.   3. HTN: BP  controlled. No changes. Will continue Azor and Catapress.  4. PAD: Stable. Recent PTA/stent right popliteal artery by Dr. Gwenlyn Found March 2015.

## 2013-10-24 ENCOUNTER — Telehealth (HOSPITAL_COMMUNITY): Payer: Self-pay | Admitting: *Deleted

## 2013-11-03 ENCOUNTER — Ambulatory Visit (HOSPITAL_COMMUNITY)
Admission: RE | Admit: 2013-11-03 | Discharge: 2013-11-03 | Disposition: A | Discharge status: Home / Self Care | Payer: Medicare Other | Source: Ambulatory Visit | Attending: Cardiology | Admitting: Cardiology

## 2013-11-03 DIAGNOSIS — I739 Peripheral vascular disease, unspecified: Secondary | ICD-10-CM | POA: Insufficient documentation

## 2013-11-03 NOTE — Progress Notes (Signed)
Lower Extremity Arterial Duplex Completed. °Brianna L Mazza,RVT °

## 2013-11-06 DIAGNOSIS — H5200 Hypermetropia, unspecified eye: Secondary | ICD-10-CM | POA: Diagnosis not present

## 2013-11-06 DIAGNOSIS — H52229 Regular astigmatism, unspecified eye: Secondary | ICD-10-CM | POA: Diagnosis not present

## 2013-11-06 DIAGNOSIS — H524 Presbyopia: Secondary | ICD-10-CM | POA: Diagnosis not present

## 2013-11-08 DIAGNOSIS — Z01419 Encounter for gynecological examination (general) (routine) without abnormal findings: Secondary | ICD-10-CM | POA: Diagnosis not present

## 2013-11-08 DIAGNOSIS — Z0000 Encounter for general adult medical examination without abnormal findings: Secondary | ICD-10-CM | POA: Diagnosis not present

## 2013-11-08 DIAGNOSIS — R300 Dysuria: Secondary | ICD-10-CM | POA: Diagnosis not present

## 2013-11-08 DIAGNOSIS — N898 Other specified noninflammatory disorders of vagina: Secondary | ICD-10-CM | POA: Diagnosis not present

## 2013-11-08 DIAGNOSIS — N8189 Other female genital prolapse: Secondary | ICD-10-CM | POA: Diagnosis not present

## 2013-11-10 ENCOUNTER — Telehealth: Payer: Self-pay | Admitting: Cardiovascular Disease

## 2013-11-10 ENCOUNTER — Encounter: Payer: Self-pay | Admitting: Cardiovascular Disease

## 2013-11-10 NOTE — Telephone Encounter (Signed)
I spoke with patient and gave her doppler results.  She also inquired about having dental work and SBE prophylaxis.  Dr Gwenlyn Found only evaluates her for her PV issues.  She is going to see Dr Lovena Le on [place].  I advised her to ask Dr Lovena Le about SBE prophylaxis. She verbalized understanding.

## 2013-11-10 NOTE — Telephone Encounter (Signed)
New Message  Pt called .[name] She states someone called to discuss her recent test results. Pt would also like to discuss having a dental procedure completed, Will need clearance. Please assist

## 2013-11-28 DIAGNOSIS — R0989 Other specified symptoms and signs involving the circulatory and respiratory systems: Secondary | ICD-10-CM | POA: Diagnosis not present

## 2013-11-28 DIAGNOSIS — R934 Abnormal findings on diagnostic imaging of urinary organs: Secondary | ICD-10-CM | POA: Diagnosis not present

## 2013-11-28 DIAGNOSIS — R05 Cough: Secondary | ICD-10-CM | POA: Diagnosis not present

## 2013-11-28 DIAGNOSIS — I10 Essential (primary) hypertension: Secondary | ICD-10-CM | POA: Diagnosis not present

## 2013-12-20 DIAGNOSIS — R9431 Abnormal electrocardiogram [ECG] [EKG]: Secondary | ICD-10-CM | POA: Diagnosis not present

## 2013-12-20 DIAGNOSIS — I10 Essential (primary) hypertension: Secondary | ICD-10-CM | POA: Diagnosis not present

## 2013-12-20 DIAGNOSIS — R001 Bradycardia, unspecified: Secondary | ICD-10-CM | POA: Diagnosis not present

## 2013-12-20 DIAGNOSIS — R0689 Other abnormalities of breathing: Secondary | ICD-10-CM | POA: Diagnosis not present

## 2013-12-22 ENCOUNTER — Telehealth: Payer: Self-pay | Admitting: *Deleted

## 2013-12-22 NOTE — Telephone Encounter (Signed)
Dr Gwenlyn Found reviewed the chart and [name] does not need antibiotic prophylaxis prior to her dental appt. Form was faxed back to Dr Lovena Le

## 2014-01-02 ENCOUNTER — Encounter: Payer: Self-pay | Admitting: Physician Assistant

## 2014-01-02 ENCOUNTER — Ambulatory Visit (INDEPENDENT_AMBULATORY_CARE_PROVIDER_SITE_OTHER): Payer: Medicare Other | Admitting: Physician Assistant

## 2014-01-02 VITALS — BP 236/110 | HR 49 | Ht 63.0 in | Wt 134.0 lb

## 2014-01-02 DIAGNOSIS — I10 Essential (primary) hypertension: Secondary | ICD-10-CM

## 2014-01-02 DIAGNOSIS — R001 Bradycardia, unspecified: Secondary | ICD-10-CM

## 2014-01-02 MED ORDER — AMLODIPINE BESYLATE 5 MG PO TABS: 5.0000 mg | ORAL_TABLET | Freq: Every day | ORAL | Status: DC

## 2014-01-02 NOTE — Assessment & Plan Note (Signed)
Patient's heart rate is 49 today. We'll not start any rate lowering medications.

## 2014-01-02 NOTE — Patient Instructions (Addendum)
Your physician has recommended you make the following change in your medication:  1) START Amlodipine(Norvasc) 5mg  daily. An Rx has been sent tot your pharmacy  Your physician recommends that you schedule a follow-up appointment asap with [name] (PCP)  You have been referred to [facility] [place]    Your physician recommends that you schedule a follow-up appointment in: January 2016 with [name]   Low-Sodium Eating Plan Sodium raises blood pressure and causes water to be held in the body. Getting less sodium from food will help lower your blood pressure, reduce any swelling, and protect your heart, liver, and kidneys. We get sodium by adding salt (sodium chloride) to food. Most of our sodium comes from canned, boxed, and frozen foods. Restaurant foods, fast foods, and pizza are also very high in sodium. Even if you take medicine to lower your blood pressure or to reduce fluid in your body, getting less sodium from your food is important. WHAT IS MY PLAN? Most people should limit their sodium intake to 2,300 mg a day. Your health care provider recommends that you limit your sodium intake to __________ a day.  WHAT DO I NEED TO KNOW ABOUT THIS EATING PLAN? For the low-sodium eating plan, you will follow these general guidelines:  Choose foods with a % Daily Value for sodium of less than 5% (as listed on the food label).   Use salt-free seasonings or herbs instead of table salt or sea salt.   Check with your health care provider or pharmacist before using salt substitutes.   Eat fresh foods.  Eat more vegetables and fruits.  Limit canned vegetables. If you do use them, rinse them well to decrease the sodium.   Limit cheese to 1 oz (28 g) per day.   Eat lower-sodium products, often labeled as "lower sodium" or "no salt added."  Avoid foods that contain monosodium glutamate (MSG). MSG is sometimes added to [place] food and some canned foods.  Check food labels  (Nutrition Facts labels) on foods to learn how much sodium is in one serving.  Eat more home-cooked food and less restaurant, buffet, and fast food.  When eating at a restaurant, ask that your food be prepared with less salt or none, if possible.  HOW DO I READ FOOD LABELS FOR SODIUM INFORMATION? The Nutrition Facts label lists the amount of sodium in one serving of the food. If you eat more than one serving, you must multiply the listed amount of sodium by the number of servings. Food labels may also identify foods as:  Sodium free--Less than 5 mg in a serving.  Very low sodium--35 mg or less in a serving.  Low sodium--140 mg or less in a serving.  Light in sodium--50% less sodium in a serving. For example, if a food that usually has 300 mg of sodium is changed to become light in sodium, it will have 150 mg of sodium.  Reduced sodium--25% less sodium in a serving. For example, if a food that usually has 400 mg of sodium is changed to reduced sodium, it will have 300 mg of sodium. WHAT FOODS CAN I EAT? Grains Low-sodium cereals, including oats, puffed wheat and rice, and shredded wheat cereals. Low-sodium crackers. Unsalted rice and pasta. Lower-sodium bread.  Vegetables Frozen or fresh vegetables. Low-sodium or reduced-sodium canned vegetables. Low-sodium or reduced-sodium tomato sauce and paste. Low-sodium or reduced-sodium tomato and vegetable juices.  Fruits Fresh, frozen, and canned fruit. Fruit juice.  Meat and Other Protein Products Low-sodium canned  tuna and salmon. Fresh or frozen meat, poultry, seafood, and fish. Lamb. Unsalted nuts. Dried beans, peas, and lentils without added salt. Unsalted canned beans. Homemade soups without salt. Eggs.  Dairy Milk. Soy milk. Ricotta cheese. Low-sodium or reduced-sodium cheeses. Yogurt.  Condiments Fresh and dried herbs and spices. Salt-free seasonings. Onion and garlic powders. Low-sodium varieties of mustard and ketchup. Lemon  juice.  Fats and Oils Reduced-sodium salad dressings. Unsalted butter.  Other Unsalted popcorn and pretzels.  The items listed above may not be a complete list of recommended foods or beverages. Contact your dietitian for more options. WHAT FOODS ARE NOT RECOMMENDED? Grains Instant hot cereals. Bread stuffing, pancake, and biscuit mixes. Croutons. Seasoned rice or pasta mixes. Noodle soup cups. Boxed or frozen macaroni and cheese. Self-rising flour. Regular salted crackers. Vegetables Regular canned vegetables. Regular canned tomato sauce and paste. Regular tomato and vegetable juices. Frozen vegetables in sauces. Salted french fries. Olives. Angie Fava. Relishes. Sauerkraut. Salsa. Meat and Other Protein Products Salted, canned, smoked, spiced, or pickled meats, seafood, or fish. Bacon, ham, sausage, hot dogs, corned beef, chipped beef, and packaged luncheon meats. Salt pork. Jerky. Pickled herring. Anchovies, regular canned tuna, and sardines. Salted nuts. Dairy Processed cheese and cheese spreads. Cheese curds. Blue cheese and cottage cheese. Buttermilk.  Condiments Onion and garlic salt, seasoned salt, table salt, and sea salt. Canned and packaged gravies. Worcestershire sauce. Tartar sauce. Barbecue sauce. Teriyaki sauce. Soy sauce, including reduced sodium. Steak sauce. Fish sauce. Oyster sauce. Cocktail sauce. Horseradish. Regular ketchup and mustard. Meat flavorings and tenderizers. Bouillon cubes. Hot sauce. Tabasco sauce. Marinades. Taco seasonings. Relishes. Fats and Oils Regular salad dressings. Salted butter. Margarine. Ghee. Bacon fat.  Other Potato and tortilla chips. Corn chips and puffs. Salted popcorn and pretzels. Canned or dried soups. Pizza. Frozen entrees and pot pies.  The items listed above may not be a complete list of foods and beverages to avoid. Contact your dietitian for more information. Document Released: 08/01/2001 Document Revised: 02/14/2013 Document  Reviewed: 12/14/2012 [facility] Patient Information 2015 [place], [place]. This information is not intended to replace advice given to you by your health care provider. Make sure you discuss any questions you have with your health care provider.

## 2014-01-02 NOTE — Progress Notes (Signed)
[name] is an 80 year old female patient [name], Dr. Gwenlyn Found, & Amanda Cowman, PA-C who has history of hypertension, CVA, PAF status post maze procedure in 2006 and PDA. She underwent successful balloon/stenting of an occluded right popliteal artery per Dr. Gwenlyn Found in March 2015. She was continued on Plavix as she is intolerant to aspirin. She recently had Dopplers that showed a increase velocities of the right popliteal artery and needs follow up in 1 year. Last 2-D echo 08/02/13 she had normal LV function EF 55-60% with a trace of TR.  She has had difficult to control hypertension. Hydralazine has caused dizziness in the past and clonidine caused bradycardia. She had mild ankle edema and cough on Azor. She had stress incontinence on diuretics. Most recently [name] started her on Avapro 150 mg twice a day. Her blood pressure is still elevated. She was told she had an abnormal EKG and they wanted to do a stress test. She has had nonspecific ST-T wave changes but they're unchanged from prior tracings. She denies any chest pain, palpitations, dyspnea, dyspnea on exertion, dizziness or presyncope.renal artery duplex in 2013 showed normal renal arteries bilaterally.   Allergies  Allergen Reactions  . Aceon [Perindopril Erbumine]     cough  . Amlodipine     Lower extremity discomfort  . Aspirin Cough  . Beta Adrenergic Blockers     Bradycardia  . Hydralazine Nausea And Vomiting and Other (See Comments)    Weakness   . Vitamin D Analogs Cough     Current Outpatient Prescriptions  Medication Sig Dispense Refill  . amLODipine-olmesartan (AZOR) 5-40 MG per tablet Take 1 tablet by mouth daily.    . Brimonidine Tartrate-Timolol (COMBIGAN OP) Apply 1 drop to eye at bedtime.    . cloNIDine (CATAPRES) 0.1 MG tablet Take 0.05 mg by mouth daily.    . clopidogrel (PLAVIX) 75 MG tablet Take 75 mg by mouth daily at 12 noon.     [name] DEXILANT 60 MG capsule Take 1 capsule by mouth daily.  0    . Multiple Vitamin (MULTIVITAMIN WITH MINERALS) TABS tablet Take 1 tablet by mouth every morning.    . Nepafenac (ILEVRO) 0.3 % SUSP Place 1 drop into the right eye at bedtime.     [name] olmesartan (BENICAR) 40 MG tablet Take 40 mg by mouth daily.    [name] thyroid (ARMOUR) 15 MG tablet Take 15 mg by mouth every morning.      No current facility-administered medications for this visit.    Past Medical History  Diagnosis Date  . Hypertension     a. Normal renal arteries by duplex 2013.  [name] PAF (paroxysmal atrial fibrillation)     a. s/p MAZE 2006. b. Event monitor 2013: NSR with PACs.  . Hypothyroidism   . PAD (peripheral artery disease)     a. 04/2013: s/p PCI to occluded right popliteal artery   . High cholesterol   . Migraine   . Stroke     a. Pt reports 3-4 prior strokes without residual sx. b. CVA 2013 (presented with headache) - started on Plavix. Event monitor as outpt - no AF.  [name] Arthritis   . Glaucoma of both eyes   . Sinus bradycardia     a. 03/2011 during hospital admission - beta blocker stopped.    Past Surgical History  Procedure Laterality Date  . Maze  ~ 2006  . Popliteal artery stent  05/15/2013    Family History  Problem Relation Age of Onset  . Hypertension Mother     History   Social History  . Marital Status: Married    Spouse Name: N/A    Number of Children: N/A  . Years of Education: N/A   Occupational History  . Not on file.   Social History Main Topics  . Smoking status: Never Smoker   . Smokeless tobacco: Never Used  . Alcohol Use: No  . Drug Use: No  . Sexual Activity: No   Other Topics Concern  . Not on file   Social History Narrative    ROS:see history of present illness otherwise negative  BP 236/110 mmHg  Pulse 49  Ht 5\' 3"  (1.6 m)  Wt 134 lb (60.782 kg)  BMI 23.74 kg/m2  PHYSICAL EXAM: Well-nournished, in no acute distress. Neck: No JVD, HJR, Bruit, or thyroid enlargement  Lungs: No tachypnea, clear without wheezing, rales,  or rhonchi  Cardiovascular: RRR, PMI not displaced, Normal S1 and S2,  Positive S4, no murmurs,  bruit, thrill, or heave.  Abdomen: BS normal. Soft without organomegaly, masses, lesions or tenderness.  Extremities: without cyanosis, clubbing or edema. Good distal pulses bilateral  SKin: Warm, no lesions or rashes   Musculoskeletal: No deformities  Neuro: no focal signs   Wt Readings from Last 3 Encounters:  01/02/14 134 lb (60.782 kg)  09/22/13 135 lb (61.236 kg)  07/10/13 133 lb (60.328 kg)    Lab Results  Component Value Date   WBC 5.9 05/16/2013   HGB 11.2* 05/16/2013   HCT 33.9* 05/16/2013   PLT 202 05/16/2013   GLUCOSE 100* 05/16/2013   CHOL 233* 04/03/2011   TRIG 85 04/03/2011   HDL 73 04/03/2011   LDLCALC 143* 04/03/2011   NA 144 05/16/2013   K 4.1 05/16/2013   CL 107 05/16/2013   CREATININE 0.93 05/16/2013   BUN 18 05/16/2013   CO2 21 05/16/2013   TSH 3.408 05/10/2013   INR 1.03 05/10/2013   HGBA1C 6.1* 04/03/2011    [id number] bradycardia 49 beats per minute with ST-T wave abnormality inferior lateral, in no acute change from prior tracings  2-D echo June/2015 normal LV function

## 2014-01-02 NOTE — Assessment & Plan Note (Signed)
Patient has very difficult to control hypertension because of all her side effects. We will try Norvasc 5 mg once daily. She seems to have tolerated mild ankle edema in the past. She is bradycardic so can't tolerate clonidine. Hydralazine caused dizziness ACE inhibitors cause cough and she has stress incontinence on diuretics. Followup with [name] PA-C for hypertension.followup with [name] in January. 2 g sodium diet

## 2014-01-29 DIAGNOSIS — H4011X1 Primary open-angle glaucoma, mild stage: Secondary | ICD-10-CM | POA: Diagnosis not present

## 2014-02-01 ENCOUNTER — Encounter (HOSPITAL_COMMUNITY): Payer: Self-pay | Admitting: Cardiovascular Disease

## 2014-03-06 ENCOUNTER — Encounter: Payer: Self-pay | Admitting: Cardiovascular Disease

## 2014-03-06 ENCOUNTER — Ambulatory Visit (INDEPENDENT_AMBULATORY_CARE_PROVIDER_SITE_OTHER): Payer: Medicare Other | Admitting: Cardiovascular Disease

## 2014-03-06 VITALS — BP 160/62 | HR 62 | Ht 63.0 in | Wt 139.4 lb

## 2014-03-06 DIAGNOSIS — I10 Essential (primary) hypertension: Secondary | ICD-10-CM | POA: Diagnosis not present

## 2014-03-06 DIAGNOSIS — I739 Peripheral vascular disease, unspecified: Secondary | ICD-10-CM | POA: Diagnosis not present

## 2014-03-06 DIAGNOSIS — I491 Atrial premature depolarization: Secondary | ICD-10-CM | POA: Diagnosis not present

## 2014-03-06 MED ORDER — IRBESARTAN 150 MG PO TABS: 150.0000 mg | ORAL_TABLET | Freq: Two times a day (BID) | ORAL | Status: DC

## 2014-03-06 MED ORDER — CLOPIDOGREL BISULFATE 75 MG PO TABS: 75.0000 mg | ORAL_TABLET | Freq: Every day | ORAL | Status: DC

## 2014-03-06 MED ORDER — AMLODIPINE BESYLATE 5 MG PO TABS: 5.0000 mg | ORAL_TABLET | Freq: Two times a day (BID) | ORAL | Status: DC

## 2014-03-06 NOTE — Patient Instructions (Signed)
Your physician recommends that you schedule a follow-up appointment in: about 2 months with Dr. Angelena Form and about 2 months with Dr. Gwenlyn Found.  Your physician has recommended you make the following change in your medication:  Increase amlodipine to 5 mg by mouth twice daily

## 2014-03-06 NOTE — Progress Notes (Signed)
History of Present Illness: 81 yo AAF with history of HTN, CVA, PAF s/p MAZE 2006, PAD who is here today for cardiac follow up. She was admitted to [facility] on 04/01/11 with a headache, elevated BP and sinus bradycardia. She saw her family physician about 10 day prior to admission and was started on Bystolic for better blood pressure control. She had been compliant with this medication but began to have headaches and came into the ED. Head CT was non-specific. I saw her as a Optometrist for bradycardia and we held her beta blocker. Brain MRI on 04/02/11 showed acute 1 cm right occipital cortical infarct without hemorrhage. No mass lesion or hydrocephalus. Atrophy and small vessel disease. Remote bihemispheric infarcts. She was seen by Neurology and given her history of remote infarcts and history of PAF in the past before admission, recommendation was made an event monitor to exclude recurrence of atrial fibrillation. She was started on Plavix in the hospital. Her beta blocker was held and an ARB was started. She was started on Norvasc in the hospital but had burning in her legs so she stopped it. Echo 04/02/11 with normal LV size and function. I saw her here in the office March 2013 and ordered an event monitor to exclude PAF. Event monitor showed NSR with PACs.No evidence of atrial fib. Renal artery dopplers November 2013 without evidence of renal artery stenosis. I started HCTZ in October 2013 but she did not want to try this medication. She was was admitted 3/23-3/24/15 for planned lower extremity angiogram due to significant RLE claudication. She underwent successful balloon/stenting of an occluded right popliteal artery per Dr. Gwenlyn Found. She was continued on Plavix as she is intolerant to aspirin. She had issues with significant hypertension during that admission and was treated with IV NTG/hydralazine to bring her BP down. Coreg was prescribed, however, the patient was reluctant to take this due to  her history of sinus bradycardia and baseline HR typically around 54. She was seen by Melina Copa, PA-C on 05/19/13 in the office for elevated BP. She reported compliance with Benicar. She was started on Hydralazine 50 mg po TID but she stopped this due to dizziness. I started clonidine 0.1mg  po BID but she had more bradycardia so Azor was added in primary care but this caused LE edema. Echo June 2015 at [facility] with normal LV function. She has most recently been on Avapro 150 mg po BID, started in primary care.   She is here today for follow up. No chest pain or SOB. No palpitations. Feeling well. SBP has been 140-160 at home.   Primary Care Physician: Toney Reil in [facility]  Last Lipid Profile:Lipid Panel     Component Value Date/Time   CHOL 233* 04/03/2011 0614   TRIG 85 04/03/2011 0614   HDL 73 04/03/2011 0614   CHOLHDL 3.2 04/03/2011 0614   VLDL 17 04/03/2011 0614   LDLCALC 143* 04/03/2011 [id number]     Past Medical History  Diagnosis Date  . Hypertension     a. Normal renal arteries by duplex 2013.  [name] PAF (paroxysmal atrial fibrillation)     a. s/p MAZE 2006. b. Event monitor 2013: NSR with PACs.  . Hypothyroidism   . PAD (peripheral artery disease)     a. 04/2013: s/p PCI to occluded right popliteal artery   . High cholesterol   . Migraine   . Stroke     a. Pt reports 3-4 prior strokes without residual sx.  b. CVA 2013 (presented with headache) - started on Plavix. Event monitor as outpt - no AF.  [name] Arthritis   . Glaucoma of both eyes   . Sinus bradycardia     a. 03/2011 during hospital admission - beta blocker stopped.    Past Surgical History  Procedure Laterality Date  . Maze  ~ 2006  . Popliteal artery stent  05/15/2013  . Lower extremity angiogram N/A 05/15/2013    Procedure: LOWER EXTREMITY ANGIOGRAM;  Surgeon: Lorretta Harp, MD;  Location: [facility] CATH LAB;  Service: Cardiovascular;  Laterality: N/A;    Current Outpatient Prescriptions  Medication Sig Dispense Refill   . amLODipine (NORVASC) 5 MG tablet Take 1 tablet (5 mg total) by mouth daily. 30 tablet 5  . ARMOUR THYROID 30 MG tablet Take 15 mg by mouth daily.  0  . clopidogrel (PLAVIX) 75 MG tablet Take 75 mg by mouth daily at 12 noon.     . COMBIGAN 0.2-0.5 % ophthalmic solution 1 drop every 12 (twelve) hours. 1 drop in both eyes daily  0  . DEXILANT 60 MG capsule Take 1 capsule by mouth daily.  0  . irbesartan (AVAPRO) 150 MG tablet Take 150 mg by mouth 2 (two) times daily.     No current facility-administered medications for this visit.    Allergies  Allergen Reactions  . Aceon [Perindopril Erbumine]     cough  . Amlodipine     Lower extremity discomfort  . Aspirin Cough  . Azor [Amlodipine-Olmesartan] Cough  . Beta Adrenergic Blockers     Bradycardia  . Hydralazine Nausea And Vomiting and Other (See Comments)    Weakness   . Vitamin D Analogs Cough    History   Social History  . Marital Status: Married    Spouse Name: N/A    Number of Children: N/A  . Years of Education: N/A   Occupational History  . Not on file.   Social History Main Topics  . Smoking status: Never Smoker   . Smokeless tobacco: Never Used  . Alcohol Use: No  . Drug Use: No  . Sexual Activity: No   Other Topics Concern  . Not on file   Social History Narrative    Family History  Problem Relation Age of Onset  . Hypertension Mother     Review of Systems:  As stated in the HPI and otherwise negative.   BP 160/62 mmHg  Pulse 62  Ht 5\' 3"  (1.6 m)  Wt 139 lb 6.4 oz (63.231 kg)  BMI 24.70 kg/m2  SpO2 96%  Physical Examination: General: Well developed, well nourished, NAD HEENT: OP clear, mucus membranes moist SKIN: warm, dry. No rashes. Neuro: No focal deficits Musculoskeletal: Muscle strength 5/5 all ext Psychiatric: Mood and affect normal Neck: No JVD, no carotid bruits, no thyromegaly, no lymphadenopathy. Lungs:Clear bilaterally, no wheezes, rhonci, crackles Cardiovascular: Regular  rate and rhythm. No murmurs, gallops or rubs. Abdomen:Soft. Bowel sounds present. Non-tender.  Extremities: No lower extremity edema. Pulses are 2 + in the bilateral DP/PT.  Echo 08/02/13: Normal LV function, LVEF=55-60%. Trace TR  Assessment and Plan:   1. CVA: No evidence of atrial fibrillation on event monitor March 2013. She is on Plavix for CVA. No indication for coumadin.   2. Premature atrial contraction: No recent palpitations.   3. HTN: BP has been [id number] Systolic at home. Elevated today. Will increase Norvasc to 5 mg po BID. Will continue Avapro 150 mg po BID. Continue to  follow at home.   4. PAD: Stable s/p PTA/stent right popliteal artery by Dr. Gwenlyn Found March 2015. Will arrange f/u with Dr. Gwenlyn Found.

## 2014-03-14 ENCOUNTER — Telehealth: Payer: Self-pay | Admitting: *Deleted

## 2014-03-14 DIAGNOSIS — I10 Essential (primary) hypertension: Secondary | ICD-10-CM

## 2014-03-14 NOTE — Telephone Encounter (Signed)
Received request from [employer] for prior authorization for Irbesartan 150 mg by mouth twice daily.  I spoke with pharmacist and insurance will not allow 60 tablets for 30 days.  Will only allow 30 tablets for 30 days.  This was originally prescribed by primary care and refilled by our office at recent office visit.  I spoke with pt's insurance [phone]) and request for name brand and requested quantity has already been requested by primary provider and has been denied.  Will need to go through appeals process for requested quantity.  Insurance will send this information to our office.

## 2014-03-19 MED ORDER — IRBESARTAN 300 MG PO TABS
300.0000 mg | ORAL_TABLET | Freq: Every day | ORAL | Status: DC
Start: 2014-03-19 — End: 2014-03-29

## 2014-03-19 NOTE — Telephone Encounter (Signed)
Spoke with pt and gave her information from Dr. Angelena Form. Will send new prescription to [facility] on [address].  She will monitor blood pressure at home and let us know if elevated. She has appt with new primary care in April.

## 2014-03-19 NOTE — Telephone Encounter (Signed)
Spoke with pt and asked her to contact primary care for follow up on appeal as they originally prescribed Irbersartan 150 mg twice daily.  Pt states she is not sure she will continue to follow in primary care.  She is willing to try once a day dosing of increased dose of  Avapro.  Will forward to Dr. Angelena Form for dosing instructions.

## 2014-03-19 NOTE — Telephone Encounter (Signed)
I would change to Avapro 300 mg once daily. She will need to f/u in primary care. cdm

## 2014-03-28 ENCOUNTER — Telehealth: Payer: Self-pay | Admitting: Cardiovascular Disease

## 2014-03-28 DIAGNOSIS — I10 Essential (primary) hypertension: Secondary | ICD-10-CM

## 2014-03-28 NOTE — Telephone Encounter (Signed)
I spoke with pt's pharmacy and confirmed they have refills of Clopidogrel and will get ready for pt.  I gave pt this information. Pt reports she would like to try to take 1/2 of Avapro 300 in AM and 1/2 in PM instead of 300 mg daily. I told her this should be OK. Pt reports she was able to take Norvasc 5 mg daily but since increased to 5 mg twice daily she has burning in ankles that is worse at night. Some swelling in the evenings but it goes away by morning.  States she had this trouble in past when Norvasc increased. She did not take PM dose last night and burning was better. Reports blood pressure "jumps around a lot" but yesterday it was 141/68 and heart rate around 50.  No other readings available. Will forward to Dr. Angelena Form for recommendations

## 2014-03-28 NOTE — Telephone Encounter (Signed)
New message     Pt c/o medication issue:  1. Name of Medication: avapro and norvasc  2. How are you currently taking this medication (dosage and times per day)? 300mg ---10mg  or norvasc 3. Are you having a reaction (difficulty breathing--STAT)? no  4. What is your medication issue? Want to cut 300mg  pill in half and take 1/2 pill in am and 1/2 at night-----legs and ankles are burning when she takes 10mg  And pt needs plavix refilled.  She said she has requested it to be refilled several times

## 2014-03-29 MED ORDER — IRBESARTAN 150 MG PO TABS: 150.0000 mg | ORAL_TABLET | Freq: Two times a day (BID) | ORAL | Status: DC

## 2014-03-29 MED ORDER — AMLODIPINE BESYLATE 5 MG PO TABS: 5.0000 mg | ORAL_TABLET | Freq: Every day | ORAL | Status: DC

## 2014-03-29 NOTE — Telephone Encounter (Signed)
Informed the pt of new medication changes per Dr Angelena Form, to reduce her Norvasc to 5 mg po once daily, and take Avapro 150 mg po BID.  Made med changes in pts chart.  Pt verbalized understanding, agrees with this plan, and gracious for all the assistance provided.

## 2014-03-29 NOTE — Telephone Encounter (Signed)
Can we reduce her Norvasc to 5 mg daily? She can take the Avapro 150 mg po BID. Thanks, chris

## 2014-04-09 LAB — HM PAP SMEAR: HM Pap smear: NORMAL

## 2014-04-23 ENCOUNTER — Telehealth: Payer: Self-pay

## 2014-04-23 NOTE — Telephone Encounter (Signed)
She should have this refilled by primary care. Thanks, [name]

## 2014-04-24 ENCOUNTER — Telehealth: Payer: Self-pay | Admitting: Cardiovascular Disease

## 2014-04-24 DIAGNOSIS — N8189 Other female genital prolapse: Secondary | ICD-10-CM | POA: Diagnosis not present

## 2014-04-24 DIAGNOSIS — N898 Other specified noninflammatory disorders of vagina: Secondary | ICD-10-CM | POA: Diagnosis not present

## 2014-04-24 DIAGNOSIS — R8299 Other abnormal findings in urine: Secondary | ICD-10-CM | POA: Diagnosis not present

## 2014-04-24 NOTE — Telephone Encounter (Signed)
New Message  Pt wanting to speak w/ Fraser Din, specifically about thyroid medication. Please call back and discuss.

## 2014-04-24 NOTE — Telephone Encounter (Signed)
Per previous phone note Dr. Angelena Form would prefer this be filled by primary care.  I spoke with pt and gave her this information. She is in the process of changing primary care providers.  She has not checked to see if original primary care provider will refill.  She will check with him first and if he will not refill she will let us know.

## 2014-05-08 ENCOUNTER — Encounter: Payer: Self-pay | Admitting: Cardiovascular Disease

## 2014-05-08 ENCOUNTER — Ambulatory Visit (INDEPENDENT_AMBULATORY_CARE_PROVIDER_SITE_OTHER): Payer: Medicare Other | Admitting: Cardiovascular Disease

## 2014-05-08 VITALS — BP 170/74 | HR 52 | Ht 63.0 in | Wt 136.0 lb

## 2014-05-08 DIAGNOSIS — I998 Other disorder of circulatory system: Secondary | ICD-10-CM

## 2014-05-08 DIAGNOSIS — I70229 Atherosclerosis of native arteries of extremities with rest pain, unspecified extremity: Secondary | ICD-10-CM

## 2014-05-08 NOTE — Progress Notes (Signed)
05/08/2014 [name]   10/04/1925  [id number]  Primary Physician Secundino Ginger, PA-C Primary Cardiologist: Lorretta Harp MD Renae Gloss   HPI:  [name] is a delightful 81 year old married African American female mother of 4 children, grandmother of 7 grandchildren who was referred by Isaias Cowman PA-C at [facility] for critical limb ischemia. I last saw her one year ago. Her past medical history is remarkable for paroxysmal atrial fibrillation and hypertension. She has had strokes in the past and is on Plavix. She developed right calf pain in early March and was referred here for further evaluation. Arterial Doppler studies revealed a right ABI of 0.48 with an occluded right popliteal artery. Based on this I angiogrammed her confirming this and performed PTA and stenting with reconstitution of the popliteal artery with an IDEV stent ,  With resolution of her claudication symptoms and improvement in her arterial Doppler studies of the right ABI 1.1. Who is recent Dopplers performed in December revealed slight decline in her ABI 0.88 a slight increase in her popliteal velocities that she continues to deny claudication.   Current Outpatient Prescriptions  Medication Sig Dispense Refill  . amLODipine (NORVASC) 5 MG tablet Take 1 tablet (5 mg total) by mouth daily. 60 tablet 6  . ARMOUR THYROID 30 MG tablet Take 15 mg by mouth daily.  0  . clopidogrel (PLAVIX) 75 MG tablet Take 1 tablet (75 mg total) by mouth daily at 12 noon. 30 tablet 6  . COSOPT 22.3-6.8 MG/ML ophthalmic solution   0  . irbesartan (AVAPRO) 150 MG tablet Take 1 tablet (150 mg total) by mouth 2 (two) times daily. 120 tablet 0   No current facility-administered medications for this visit.    Allergies  Allergen Reactions  . Aceon [Perindopril Erbumine]     cough  . Amlodipine     Lower extremity discomfort  . Aspirin Cough  . Azor [Amlodipine-Olmesartan] Cough  . Beta Adrenergic Blockers      Bradycardia  . Hydralazine Nausea And Vomiting and Other (See Comments)    Weakness   . Vitamin D Analogs Cough    History   Social History  . Marital Status: Married    Spouse Name: N/A  . Number of Children: N/A  . Years of Education: N/A   Occupational History  . Not on file.   Social History Main Topics  . Smoking status: Never Smoker   . Smokeless tobacco: Never Used  . Alcohol Use: No  . Drug Use: No  . Sexual Activity: No   Other Topics Concern  . Not on file   Social History Narrative     Review of Systems: General: negative for chills, fever, night sweats or weight changes.  Cardiovascular: negative for chest pain, dyspnea on exertion, edema, orthopnea, palpitations, paroxysmal nocturnal dyspnea or shortness of breath Dermatological: negative for rash Respiratory: negative for cough or wheezing Urologic: negative for hematuria Abdominal: negative for nausea, vomiting, diarrhea, bright red blood per rectum, melena, or hematemesis Neurologic: negative for visual changes, syncope, or dizziness All other systems reviewed and are otherwise negative except as noted above.    Blood pressure 170/74, pulse 52, height 5\' 3"  (1.6 m), weight 136 lb (61.689 kg).  General appearance: alert and no distress Neck: no adenopathy, no carotid bruit, no JVD, supple, symmetrical, trachea midline and thyroid not enlarged, symmetric, no tenderness/mass/nodules Lungs: clear to auscultation bilaterally Heart: regular rate and rhythm, S1, S2 normal, no murmur, click,  rub or gallop Extremities: extremities normal, atraumatic, no cyanosis or edema  EKG not performed today  ASSESSMENT AND PLAN:   Critical lower limb ischemia I'm seeing [name] back in follow-up of critical ischemia. I last year ago. She was referred to me by [name] PA-C.I performed PTCA and stenting of an occluded right lower extremity popliteal artery 05/15/13 with stenting using an IDEV  stent. She had  excellent angiographic and clinical result.her Dopplers normalized. Her most recent Dopplers performed in September of last year revealed a right ABI 0.88 with a moderately high frequency signal in the right popliteal artery. She denies claudication currently.       Lorretta Harp MD FACP,FACC,FAHA, [facility], [place] 05/08/2014 3:52 PM

## 2014-05-08 NOTE — Patient Instructions (Signed)
Your physician wants you to follow-up in: 1 year with Dr Berry. You will receive a reminder letter in the mail two months in advance. If you don't receive a letter, please call our office to schedule the follow-up appointment.  

## 2014-05-08 NOTE — Assessment & Plan Note (Signed)
I'm seeing Mrs. Brooker back in follow-up of critical ischemia. I last year ago. She was referred to me by [name] PA-C.I performed PTCA and stenting of an occluded right lower extremity popliteal artery 05/15/13 with stenting using an IDEV  stent. She had excellent angiographic and clinical result.her Dopplers normalized. Her most recent Dopplers performed in September of last year revealed a right ABI 0.88 with a moderately high frequency signal in the right popliteal artery. She denies claudication currently.

## 2014-05-14 ENCOUNTER — Telehealth: Payer: Self-pay | Admitting: Cardiovascular Disease

## 2014-05-14 NOTE — Telephone Encounter (Signed)
Patient will not need to take abx prophylaxis for dental work - no h/o valve replacement or endocarditis. This was communicated to patient and she voiced understanding

## 2014-05-14 NOTE — Telephone Encounter (Signed)
[name] is calling because she needs to go to the dentist and wants to know if she is going to need a antibiotic . Please call   Thanks

## 2014-05-21 ENCOUNTER — Encounter: Payer: Self-pay | Admitting: Cardiovascular Disease

## 2014-05-21 ENCOUNTER — Ambulatory Visit (INDEPENDENT_AMBULATORY_CARE_PROVIDER_SITE_OTHER): Payer: Medicare Other | Admitting: Cardiovascular Disease

## 2014-05-21 VITALS — BP 162/74 | HR 58 | Ht 63.0 in | Wt 137.1 lb

## 2014-05-21 DIAGNOSIS — I10 Essential (primary) hypertension: Secondary | ICD-10-CM | POA: Diagnosis not present

## 2014-05-21 DIAGNOSIS — I739 Peripheral vascular disease, unspecified: Secondary | ICD-10-CM

## 2014-05-21 DIAGNOSIS — I491 Atrial premature depolarization: Secondary | ICD-10-CM

## 2014-05-21 NOTE — Patient Instructions (Signed)
Your physician wants you to follow-up in:  12 months.  You will receive a reminder letter in the mail two months in advance. If you don't receive a letter, please call our office to schedule the follow-up appointment.   

## 2014-05-21 NOTE — Progress Notes (Signed)
History of Present Illness: 81 yo AAF with history of HTN, CVA, PAF s/p MAZE 2006, PAD who is here today for cardiac follow up. She was admitted to [facility] on 04/01/11 with a headache, elevated BP and sinus bradycardia. She saw her family physician about 10 day prior to admission and was started on Bystolic for better blood pressure control. She had been compliant with this medication but began to have headaches and came into the ED. Head CT was non-specific. I saw her as a Optometrist for bradycardia and we held her beta blocker. Brain MRI on 04/02/11 showed acute 1 cm right occipital cortical infarct without hemorrhage. No mass lesion or hydrocephalus. Atrophy and small vessel disease. Remote bihemispheric infarcts. She was seen by Neurology and given her history of remote infarcts and history of PAF in the past before admission, recommendation was made an event monitor to exclude recurrence of atrial fibrillation. She was started on Plavix in the hospital. Her beta blocker was held and an ARB was started. She was started on Norvasc in the hospital but had burning in her legs so she stopped it. Echo 04/02/11 with normal LV size and function. I saw her here in the office March 2013 and ordered an event monitor to exclude PAF. Event monitor showed NSR with PACs.No evidence of atrial fib. Renal artery dopplers November 2013 without evidence of renal artery stenosis. I started HCTZ in October 2013 but she did not want to try this medication. She was was admitted 3/23-3/24/15 for planned lower extremity angiogram due to significant RLE claudication. She underwent successful balloon/stenting of an occluded right popliteal artery per Dr. Gwenlyn Found. She was continued on Plavix as she is intolerant to aspirin. She had issues with significant hypertension during that admission and was treated with IV NTG/hydralazine to bring her BP down. Coreg was prescribed, however, the patient was reluctant to take this due to  her history of sinus bradycardia and baseline HR typically around 54. She was seen by Melina Copa, PA-C on 05/19/13 in the office for elevated BP. She reported compliance with Benicar. She was started on Hydralazine 50 mg po TID but she stopped this due to dizziness. I started clonidine 0.1mg  po BID but she had more bradycardia so Azor was added in primary care but this caused LE edema. Echo June 2015 at [facility] with normal LV function. She has most recently been on Avapro 150 mg po BID and Norvasc 5 mg daily.    She is here today for follow up. No chest pain or SOB. No palpitations. Feeling well. BP has been controlled at home with highest readings 0000000 systolic. Lower ext edema is minimal.   Primary Care Physician:  Yong Channel  Last Lipid Profile:Lipid Panel     Component Value Date/Time   CHOL 233* 04/03/2011 0614   TRIG 85 04/03/2011 0614   HDL 73 04/03/2011 0614   CHOLHDL 3.2 04/03/2011 0614   VLDL 17 04/03/2011 0614   LDLCALC 143* 04/03/2011 [id number]     Past Medical History  Diagnosis Date  . Hypertension     a. Normal renal arteries by duplex 2013.  [name] PAF (paroxysmal atrial fibrillation)     a. s/p MAZE 2006. b. Event monitor 2013: NSR with PACs.  . Hypothyroidism   . PAD (peripheral artery disease)     a. 04/2013: s/p PCI to occluded right popliteal artery   . High cholesterol   . Migraine   . Stroke  a. Pt reports 3-4 prior strokes without residual sx. b. CVA 2013 (presented with headache) - started on Plavix. Event monitor as outpt - no AF.  [name] Arthritis   . Glaucoma of both eyes   . Sinus bradycardia     a. 03/2011 during hospital admission - beta blocker stopped.    Past Surgical History  Procedure Laterality Date  . Maze  ~ 2006  . Popliteal artery stent  05/15/2013  . Lower extremity angiogram N/A 05/15/2013    Procedure: LOWER EXTREMITY ANGIOGRAM;  Surgeon: Lorretta Harp, MD;  Location: [facility] C[facility] LAB;  Service: Cardiovascular;  Laterality: N/A;    Current Outpatient  Prescriptions  Medication Sig Dispense Refill  . amLODipine (NORVASC) 5 MG tablet Take 1 tablet (5 mg total) by mouth daily. 60 tablet 6  . ARMOUR THYROID 30 MG tablet Take 15 mg by mouth daily.  0  . clopidogrel (PLAVIX) 75 MG tablet Take 1 tablet (75 mg total) by mouth daily at 12 noon. 30 tablet 6  . COSOPT 22.3-6.8 MG/ML ophthalmic solution   0  . irbesartan (AVAPRO) 150 MG tablet Take 1 tablet (150 mg total) by mouth 2 (two) times daily. 120 tablet 0  . PAZEO 0.7 % SOLN Place 1 drop into both eyes daily.  0   No current facility-administered medications for this visit.    Allergies  Allergen Reactions  . Aceon [Perindopril Erbumine]     cough  . Amlodipine     Lower extremity discomfort  . Aspirin Cough  . Azor [Amlodipine-Olmesartan] Cough  . Beta Adrenergic Blockers     Bradycardia  . Hydralazine Nausea And Vomiting and Other (See Comments)    Weakness   . Vitamin D Analogs Cough    History   Social History  . Marital Status: Married    Spouse Name: N/A  . Number of Children: N/A  . Years of Education: N/A   Occupational History  . Not on file.   Social History Main Topics  . Smoking status: Never Smoker   . Smokeless tobacco: Never Used  . Alcohol Use: No  . Drug Use: No  . Sexual Activity: No   Other Topics Concern  . Not on file   Social History Narrative    Family History  Problem Relation Age of Onset  . Hypertension Mother   . Ulcers Father   . Cervical cancer Sister   . Throat cancer Brother   . Lung cancer Brother   . Cancer Brother     Review of Systems:  As stated in the HPI and otherwise negative.   BP 162/74 mmHg  Pulse 58  Ht 5\' 3"  (1.6 m)  Wt 137 lb 1.9 oz (62.197 kg)  BMI 24.30 kg/m2  Physical Examination: General: Well developed, well nourished, NAD HEENT: OP clear, mucus membranes moist SKIN: warm, dry. No rashes. Neuro: No focal deficits Musculoskeletal: Muscle strength 5/5 all ext Psychiatric: Mood and affect  normal Neck: No JVD, no carotid bruits, no thyromegaly, no lymphadenopathy. Lungs:Clear bilaterally, no wheezes, rhonci, crackles Cardiovascular: Regular rate and rhythm. No murmurs, gallops or rubs. Abdomen:Soft. Bowel sounds present. Non-tender.  Extremities: No lower extremity edema. Pulses are 2 + in the bilateral DP/PT.  Echo 08/02/13: Normal LV function, LVEF=55-60%. Trace TR  Assessment and Plan:   1. Premature atrial contraction: No recent palpitations.   2. HTN: BP has been mostly controlled at home. Slightly elevated today but good for her. Will continue Norvasc 5 mg daily  and Avapro  Will increase Norvasc to 5 mg po BID. Will continue Avapro 150 mg po BID. Continue to follow at home.   3. PAD: Stable s/p PTA/stent right popliteal artery by Dr. Gwenlyn Found March 2015. Recent f/u with Dr. Gwenlyn Found and she is felt to be stable.

## 2014-06-07 ENCOUNTER — Encounter: Payer: Self-pay | Admitting: Family Medicine

## 2014-06-07 ENCOUNTER — Ambulatory Visit (INDEPENDENT_AMBULATORY_CARE_PROVIDER_SITE_OTHER): Payer: Medicare Other | Admitting: Family Medicine

## 2014-06-07 VITALS — BP 150/68 | HR 52 | Temp 97.9°F | Resp 20 | Ht 63.0 in | Wt 136.0 lb

## 2014-06-07 DIAGNOSIS — E785 Hyperlipidemia, unspecified: Secondary | ICD-10-CM | POA: Diagnosis not present

## 2014-06-07 DIAGNOSIS — E7800 Pure hypercholesterolemia, unspecified: Secondary | ICD-10-CM | POA: Insufficient documentation

## 2014-06-07 DIAGNOSIS — I10 Essential (primary) hypertension: Secondary | ICD-10-CM | POA: Diagnosis not present

## 2014-06-07 DIAGNOSIS — I480 Paroxysmal atrial fibrillation: Secondary | ICD-10-CM

## 2014-06-07 DIAGNOSIS — E039 Hypothyroidism, unspecified: Secondary | ICD-10-CM | POA: Insufficient documentation

## 2014-06-07 DIAGNOSIS — Z975 Presence of (intrauterine) contraceptive device: Secondary | ICD-10-CM | POA: Insufficient documentation

## 2014-06-07 DIAGNOSIS — I639 Cerebral infarction, unspecified: Secondary | ICD-10-CM | POA: Diagnosis not present

## 2014-06-07 DIAGNOSIS — I739 Peripheral vascular disease, unspecified: Secondary | ICD-10-CM

## 2014-06-07 MED ORDER — CLOPIDOGREL BISULFATE 75 MG PO TABS: 75.0000 mg | ORAL_TABLET | Freq: Every day | ORAL | Status: DC

## 2014-06-07 MED ORDER — THYROID 15 MG PO TABS: 15.0000 mg | ORAL_TABLET | Freq: Every day | ORAL | Status: DC

## 2014-06-07 MED ORDER — HYDROCHLOROTHIAZIDE 12.5 MG PO CAPS: 12.5000 mg | ORAL_CAPSULE | Freq: Every day | ORAL | Status: DC

## 2014-06-07 MED ORDER — IRBESARTAN 150 MG PO TABS: 150.0000 mg | ORAL_TABLET | Freq: Two times a day (BID) | ORAL | Status: DC

## 2014-06-07 NOTE — Assessment & Plan Note (Signed)
S: mild poor control given history CVA. She is on irbesartan alone. She states she gets Burning in legs on amlodipine so she stopped 3 days ago and has not noted elevations in BP BP Readings from Last 3 Encounters:  06/07/14 150/68  05/21/14 162/74  05/08/14 170/74   Home BP monitoring-yes and typically around 150-160/70 A/P: Poor control with CVA history even on home reads. Off amlodipine now due to burning in legs. We will trial HCTZ 12.5mg  with follow up in 2-4 weeks.

## 2014-06-07 NOTE — Progress Notes (Signed)
Amanda Reddish, MD Phone: [phone]  Subjective:  Patient presents today to establish care with me as PCP. PCP prior to today Isaias Cowman PA-C at [place] [facility]. Chief complaint-noted.   See problem oriented charting Not fasting today and wants to return for labs. Exercise 3x a week walking at least a mile. 8 hours of sleep a night. mammogram 07/08/13, unclear benefit at age Pertinent ROS- occasional palpitations due to likely PACs. No chest pain or shortness of breath.Denies any HA, blurry vision. No extremity weakness.   The following were reviewed and entered/updated in epic: Past Medical History  Diagnosis Date  . Hypertension     a. Normal renal arteries by duplex 2013.  [name] PAF (paroxysmal atrial fibrillation)     a. s/p MAZE 2006. b. Event monitor 2013: NSR with PACs.  . Hypothyroidism   . PAD (peripheral artery disease)     a. 04/2013: s/p PCI to occluded right popliteal artery   . High cholesterol   . Migraine   . Stroke     a. Pt reports 3-4 prior strokes without residual sx. b. CVA 2013 (presented with headache) - started on Plavix. Event monitor as outpt - no AF.  [name] Arthritis   . Glaucoma of both eyes   . Sinus bradycardia     a. 03/2011 during hospital admission - beta blocker stopped.  . Chicken pox    Patient Active Problem List   Diagnosis Date Noted  . Critical lower limb ischemia/PAD 05/10/2013    Priority: High  . PAF (paroxysmal atrial fibrillation)     Priority: High  . Stroke     Priority: High  . Hypothyroidism 06/07/2014    Priority: Medium  . Hyperlipidemia 06/07/2014    Priority: Medium  . Hypertension     Priority: Medium  . Intracervical pessary 06/07/2014    Priority: Low  . Sinus bradycardia 05/19/2013    Priority: Low  . PAC (premature atrial contraction) 05/20/2011    Priority: Low   Past Surgical History  Procedure Laterality Date  . Maze  ~ 2006  . Popliteal artery stent  05/15/2013  . Lower extremity angiogram N/A 05/15/2013    Procedure: LOWER EXTREMITY ANGIOGRAM;  Surgeon: Lorretta Harp, MD;  Location: [facility] CATH LAB;  Service: Cardiovascular;  Laterality: N/A;    Family History  Problem Relation Age of Onset  . Hypertension Mother   . Ulcers Father   . Cervical cancer Sister   . Throat cancer Brother   . Lung cancer Brother   . Cancer Brother     Medications- reviewed and updated Current Outpatient Prescriptions  Medication Sig Dispense Refill  . amLODipine (NORVASC) 5 MG tablet STOPPED Taking Take 1 tablet (5 mg total) by mouth daily. 60 tablet 6  . ARMOUR THYROID 30 MG tablet Take 15 mg by mouth daily.  0  . clopidogrel (PLAVIX) 75 MG tablet Take 1 tablet (75 mg total) by mouth daily at 12 noon. 30 tablet 6  . COSOPT 22.3-6.8 MG/ML ophthalmic solution   0  . irbesartan (AVAPRO) 150 MG tablet Take 1 tablet (150 mg total) by mouth 2 (two) times daily. 120 tablet 0  . Omega-3 Fatty Acids (OMEGA 3 PO) Take by mouth. One teaspoon daily    . OVER THE COUNTER MEDICATION Nutra Burst one tablespoon with orange juice daily     Allergies-reviewed and updated Allergies  Allergen Reactions  . Aceon [Perindopril Erbumine]     cough  . Amlodipine  Lower extremity discomfort  . Aspirin Cough  . Azor [Amlodipine-Olmesartan] Cough  . Beta Adrenergic Blockers     Bradycardia  . Hydralazine Nausea And Vomiting and Other (See Comments)    Weakness   . Vitamin D Analogs Cough    History   Social History  . Marital Status: Married    Spouse Name: N/A  . Number of Children: N/A  . Years of Education: N/A   Social History Main Topics  . Smoking status: Never Smoker   . Smokeless tobacco: Never Used  . Alcohol Use: No  . Drug Use: No  . Sexual Activity: No   Other Topics Concern  . Not on file   Social History Narrative   Married. Lives with husband. Married 65 years in 2016. [place] 22 years in 2016. 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 6 greatgrandkids.     Finished 10th grade.       Retired from working in church for [employer]            ROS--See HPI , otherwise full ROS was completed and negative except as noted above  Objective: BP 150/68 mmHg  Pulse 52  Temp(Src) 97.9 F (36.6 C) (Oral)  Resp 20  Ht 5\' 3"  (1.6 m)  Wt 136 lb (61.689 kg)  BMI 24.10 kg/m2  SpO2 95%  LMP  (Approximate) Gen: NAD, resting comfortably, appears younger than stated age.  HEENT: Mucous membranes are moist. Oropharynx normal. TM normal. Eyes: sclera and lids normal, PERRLA Neck: no thyromegaly, no cervical lymphadenopathy CV: RRR no murmurs rubs or gallops Lungs: CTAB no crackles, wheeze, rhonchi Abdomen: soft/nontender/nondistended/normal bowel sounds. No rebound or guarding.  Ext: no edema Skin: warm, dry, no rash  Neuro: 5/5 strength in upper and lower extremities, normal gait, normal reflexes  Assessment/Plan:  PAF (paroxysmal atrial fibrillation) S: denies recurrence since Maze procedure 2006. After CVA, event monitor did not show a fib, only PACs.  A/P: PAF with no recurrence since 2006. not on anticoagulant, could not tolerate beta blocker due to sinus brady. Doubt CVA related to a fib.    Hypertension S: mild poor control given history CVA. She is on irbesartan alone. She states she gets Burning in legs on amlodipine so she stopped 3 days ago and has not noted elevations in BP BP Readings from Last 3 Encounters:  06/07/14 150/68  05/21/14 162/74  05/08/14 170/74   Home BP monitoring-yes and typically around 150-160/70 A/P: Poor control with CVA history even on home reads. Off amlodipine now due to burning in legs. We will trial HCTZ 12.5mg  with follow up in 2-4 weeks.    Stroke S:Stroke 2013. No residual deficits No a fib detected on monitoring afterwards. She is on plavix.  A/P: BP above goal even with home reads. Increase BP meds as per HTN, continue plavix.    Hyperlipidemia S:Statin intolerance in past and has tried  several including crestor. She states told by primary her HDL balances things out.  Lab Results  Component Value Date   LDLCALC 143* 04/03/2011  A/P: I told patient I wanted to get updated lipids but she is not fasting today, she will return fasting and we will update. If LDL >100 I would at least try pulse therapy atorvastatin 40mg  weekly to see if we can get LDL to goal while minimizing side effects, consider injectables otherwise as states has history of several CVAs, fortunately no residual effects    2-4 week follow  up with labs at this visit including HTN-CBC, CMET Hypothyroidism- TSH Hyperlipidemia-lipid panel  Meds ordered this encounter  . thyroid (ARMOUR) 15 MG tablet    Sig: Take 1 tablet (15 mg total) by mouth daily.    Dispense:  90 tablet    Refill:  3  . hydrochlorothiazide (MICROZIDE) 12.5 MG capsule    Sig: Take 1 capsule (12.5 mg total) by mouth daily.    Dispense:  90 capsule    Refill:  3  . clopidogrel (PLAVIX) 75 MG tablet    Sig: Take 1 tablet (75 mg total) by mouth daily at 12 noon.    Dispense:  90 tablet    Refill:  3  . irbesartan (AVAPRO) 150 MG tablet    Sig: Take 1 tablet (150 mg total) by mouth 2 (two) times daily.    Dispense:  180 tablet    Refill:  3

## 2014-06-07 NOTE — Patient Instructions (Addendum)
Stop amlodipine. Continue irbesartan twice a day and start hydrochlorothiazide at low dose 12.5mg . See me back in 2-4 weeks for a morning appointment. Come in fasting (water only after midnight) and we will update labs as well.   I took over all prescriptions and sent them in as 90 days.   Sign release of information at the front desk for [facility] for office visits for 2 years, labs for 2 years, any immunizations, any colonoscopy reports.   Great to meet you both!

## 2014-06-07 NOTE — Assessment & Plan Note (Signed)
S:Stroke 2013. No residual deficits No a fib detected on monitoring afterwards. She is on plavix.  A/P: BP above goal even with home reads. Increase BP meds as per HTN, continue plavix.

## 2014-06-07 NOTE — Progress Notes (Signed)
Pre visit review using our clinic review tool, if applicable. No additional management support is needed unless otherwise documented below in the visit note. 

## 2014-06-07 NOTE — Assessment & Plan Note (Signed)
S:Statin intolerance in past and has tried several including crestor. She states told by primary her HDL balances things out.  Lab Results  Component Value Date   LDLCALC 143* 04/03/2011  A/P: I told patient I wanted to get updated lipids but she is not fasting today, she will return fasting and we will update. If LDL >100 I would at least try pulse therapy atorvastatin 40mg  weekly to see if we can get LDL to goal while minimizing side effects, consider injectables otherwise as states has history of several CVAs, fortunately no residual effects

## 2014-06-07 NOTE — Assessment & Plan Note (Signed)
S: denies recurrence since Maze procedure 2006. After CVA, event monitor did not show a fib, only PACs.  A/P: PAF with no recurrence since 2006. not on anticoagulant, could not tolerate beta blocker due to sinus brady. Doubt CVA related to a fib.

## 2014-06-14 DIAGNOSIS — H4011X1 Primary open-angle glaucoma, mild stage: Secondary | ICD-10-CM | POA: Diagnosis not present

## 2014-06-14 DIAGNOSIS — H4011X2 Primary open-angle glaucoma, moderate stage: Secondary | ICD-10-CM | POA: Diagnosis not present

## 2014-06-22 ENCOUNTER — Ambulatory Visit (INDEPENDENT_AMBULATORY_CARE_PROVIDER_SITE_OTHER): Payer: Medicare Other | Admitting: Family Medicine

## 2014-06-22 ENCOUNTER — Encounter: Payer: Self-pay | Admitting: Family Medicine

## 2014-06-22 VITALS — BP 178/78 | HR 55 | Temp 97.8°F | Wt 136.0 lb

## 2014-06-22 DIAGNOSIS — E039 Hypothyroidism, unspecified: Secondary | ICD-10-CM | POA: Diagnosis not present

## 2014-06-22 DIAGNOSIS — E785 Hyperlipidemia, unspecified: Secondary | ICD-10-CM

## 2014-06-22 DIAGNOSIS — I10 Essential (primary) hypertension: Secondary | ICD-10-CM

## 2014-06-22 DIAGNOSIS — I639 Cerebral infarction, unspecified: Secondary | ICD-10-CM

## 2014-06-22 LAB — CBC
HEMATOCRIT: 36.3 % (ref 36.0–46.0)
Hemoglobin: 12 g/dL (ref 12.0–15.0)
MCHC: 33.2 g/dL (ref 30.0–36.0)
MCV: 81.7 fl (ref 78.0–100.0)
PLATELETS: 165 10*3/uL (ref 150.0–400.0)
RBC: 4.44 Mil/uL (ref 3.87–5.11)
RDW: 14.5 % (ref 11.5–15.5)
WBC: 3 10*3/uL — AB (ref 4.0–10.5)

## 2014-06-22 LAB — COMPREHENSIVE METABOLIC PANEL
ALBUMIN: 3.9 g/dL (ref 3.5–5.2)
ALT: 14 U/L (ref 0–35)
AST: 21 U/L (ref 0–37)
Alkaline Phosphatase: 77 U/L (ref 39–117)
BILIRUBIN TOTAL: 0.6 mg/dL (ref 0.2–1.2)
BUN: 15 mg/dL (ref 6–23)
CHLORIDE: 109 meq/L (ref 96–112)
CO2: 28 mEq/L (ref 19–32)
CREATININE: 0.86 mg/dL (ref 0.40–1.20)
Calcium: 9.6 mg/dL (ref 8.4–10.5)
GFR: 80.37 mL/min (ref >60.00)
Glucose, Bld: 87 mg/dL (ref 70–99)
Potassium: 4.3 mEq/L (ref 3.5–5.1)
SODIUM: 142 meq/L (ref 135–145)
Total Protein: 6.6 g/dL (ref 6.0–8.3)

## 2014-06-22 LAB — LIPID PANEL
CHOLESTEROL: 205 mg/dL — AB (ref 0–200)
HDL: 61.8 mg/dL (ref >39.00)
LDL Cholesterol: 131 mg/dL — ABNORMAL HIGH (ref 0–99)
NonHDL: 143.2
Total CHOL/HDL Ratio: 3
Triglycerides: 59 mg/dL (ref 0.0–149.0)
VLDL: 11.8 mg/dL (ref 0.0–40.0)

## 2014-06-22 LAB — TSH: TSH: 3.09 u[IU]/mL (ref 0.35–4.50)

## 2014-06-22 MED ORDER — CHLORTHALIDONE 25 MG PO TABS: 25.0000 mg | ORAL_TABLET | Freq: Every day | ORAL | Status: DC

## 2014-06-22 NOTE — Assessment & Plan Note (Signed)
Elevated LDL >131. Myalgias on prior statins. Trial once a week atorvastatin 40mg .

## 2014-06-22 NOTE — Assessment & Plan Note (Signed)
Continue plavix. See HLD and HTN as need tighter control.

## 2014-06-22 NOTE — Assessment & Plan Note (Signed)
Very poor control and limited options. continue irbesartan 150mg  BID, start chlorthalidone-could have same SE as hctz. Consider clonidine as alternate.  Would not use spironolactone give hyperkalemia risk  SE on previous meds include HCTZ 12.5mg -burning with urination Amlodipine-burning in shins Beta blocker-cannot tolerate due to brady

## 2014-06-22 NOTE — Progress Notes (Signed)
Garret Reddish, MD Subjective:   [name] is a 81 y.o. year old very pleasant female patient who presents with:  Hypertension-very poor control on irbesartan 150mg  BID  CVA history- high risk patient with CVA and PAD history BP Readings from Last 3 Encounters:  06/22/14 178/78  06/07/14 150/68  05/21/14 162/74   Home BP monitoring-yes and ranging from 140s to 160s Compliant with medications-yes without side effects on irbesartan Stopped hctz due to burning with peeing and resolved ROS-Denies any CP, HA, SOB, blurry vision, LE edema, transient weakness, extremity weakness, facial weakness, slurred speech, orthopnea, PND.   Hyperlipidemia-poor control  Lab Results  Component Value Date   LDLCALC 131* 06/22/2014   On statin: no myalgias on several in past ROS- no chest pain or shortness of breath. No myalgias  Past Medical History- history CVA on plavix, PAD  Medications- reviewed and updated Current Outpatient Prescriptions  Medication Sig Dispense Refill  . clopidogrel (PLAVIX) 75 MG tablet Take 1 tablet (75 mg total) by mouth daily at 12 noon. 90 tablet 3  . COSOPT 22.3-6.8 MG/ML ophthalmic solution   0  . irbesartan (AVAPRO) 150 MG tablet Take 1 tablet (150 mg total) by mouth 2 (two) times daily. 180 tablet 3  . Omega-3 Fatty Acids (OMEGA 3 PO) Take by mouth. One teaspoon daily    . OVER THE COUNTER MEDICATION Nutra Burst one tablespoon with orange juice daily    . thyroid (ARMOUR) 15 MG tablet Take 1 tablet (15 mg total) by mouth daily. 90 tablet 3   No current facility-administered medications for this visit.    Objective: BP 178/78 mmHg  Pulse 55  Temp(Src) 97.8 F (36.6 C)  Wt 136 lb (61.689 kg)  LMP  (Approximate) Right arm 180/84 Gen: NAD, resting comfortably in chair, PERRLA CV: RRR no murmurs rubs or gallops Lungs: CTAB no crackles, wheeze, rhonchi Abdomen: soft/nontender/nondistended/normal bowel sounds.  Ext: no edema Skin: warm, dry Neuro: grossly  normal, moves all extremities, 5/5 strength upper and lower extremities, normal gait, normal reflexes, no facial asymmetry   Assessment/Plan:  Hypertension Very poor control and limited options. continue irbesartan 150mg  BID, start chlorthalidone-could have same SE as hctz. Consider clonidine as alternate.  Would not use spironolactone give hyperkalemia risk  SE on previous meds include HCTZ 12.5mg -burning with urination Amlodipine-burning in shins Beta blocker-cannot tolerate due to brady   Hyperlipidemia Elevated LDL >131. Myalgias on prior statins. Trial once a week atorvastatin 40mg .   Stroke Continue plavix. See HLD and HTN as need tighter control.    next week BP check.   Orders Placed This Encounter  Procedures  . Lipid panel  . CBC (no diff)  . Comprehensive metabolic panel  . TSH    Meds ordered this encounter  Medications  . chlorthalidone (HYGROTON) 25 MG tablet    Sig: Take 1 tablet (25 mg total) by mouth daily.    Dispense:  30 tablet    Refill:  5

## 2014-06-22 NOTE — Patient Instructions (Addendum)
Get copy of colonoscopy report faxed to [place] at [phone].  Start chlorthalidone tomorrow morning, hopefully this does not cause burning  Take irbesartan as soon as you get home  If you had chest pain, shortness of breath, blurry vision, extremity or facial weakness, seek after hours care  See me next week  Fasting labs today

## 2014-06-25 ENCOUNTER — Encounter: Payer: Self-pay | Admitting: Family Medicine

## 2014-06-25 DIAGNOSIS — E559 Vitamin D deficiency, unspecified: Secondary | ICD-10-CM | POA: Insufficient documentation

## 2014-06-25 DIAGNOSIS — R739 Hyperglycemia, unspecified: Secondary | ICD-10-CM | POA: Insufficient documentation

## 2014-06-25 DIAGNOSIS — K219 Gastro-esophageal reflux disease without esophagitis: Secondary | ICD-10-CM | POA: Insufficient documentation

## 2014-06-26 ENCOUNTER — Encounter: Payer: Self-pay | Admitting: Family Medicine

## 2014-06-26 LAB — VITAMIN D 25 HYDROXY (VIT D DEFICIENCY, FRACTURES): Vit D, 25-Hydroxy: 28.75

## 2014-06-29 ENCOUNTER — Encounter: Payer: Self-pay | Admitting: Family Medicine

## 2014-06-29 ENCOUNTER — Ambulatory Visit (INDEPENDENT_AMBULATORY_CARE_PROVIDER_SITE_OTHER): Payer: Medicare Other | Admitting: Family Medicine

## 2014-06-29 VITALS — BP 190/72 | HR 47 | Temp 98.5°F | Wt 127.0 lb

## 2014-06-29 DIAGNOSIS — E785 Hyperlipidemia, unspecified: Secondary | ICD-10-CM

## 2014-06-29 DIAGNOSIS — I10 Essential (primary) hypertension: Secondary | ICD-10-CM | POA: Diagnosis not present

## 2014-06-29 MED ORDER — HYDRALAZINE HCL 10 MG PO TABS: 10.0000 mg | ORAL_TABLET | Freq: Three times a day (TID) | ORAL | Status: DC

## 2014-06-29 NOTE — Assessment & Plan Note (Signed)
Agrees to trial atorvastatin 40mg  once a week but wants to focus on controlling BP first and absolutely does not want to start more than 1 medication at a time.

## 2014-06-29 NOTE — Patient Instructions (Addendum)
Blood Pressure  High in office. Bring your home cuff so we can verify it.   Start hydralazine 10mg  three times a day  You used this medicine at 50mg  before and had some dizziness  Think you will do better on low dose.   Bring a log of your home blood pressures  Cholesterol  Will not start anything until we see you back  Once a week medicine to start at next visit  See [place] in 1 month

## 2014-06-29 NOTE — Progress Notes (Signed)
Garret Reddish, MD  Subjective:  [name] is a 81 y.o. year old very pleasant female patient who presents with:  Hypertension-very poor control on irbesartan 150mg  BID, stopped chlorthalidone  BP Readings from Last 3 Encounters:  06/29/14 190/72  06/22/14 178/78  06/07/14 150/68  history of cough on multiple medicines including prior BP meds-thought maybe chlorthalidone, vit D.  Watches salt   Home BP monitoring-got down to 150s 160s with chlorthalidone, has not checked since stopping chlorthalidone Compliant with medications-cough with chlorthalidone, tolerant with avapro ROS-Denies any CP, HA, SOB, blurry vision, LE edema.   Hyperlipidemia-poor control with CVA history  Lab Results  Component Value Date   LDLCALC 131* 06/22/2014   On statin: no, declines until attempt other BP meds ROS- no chest pain or shortness of breath. No myalgias  Past Medical History- PAF, CVA history, PAD, HTN, hypothyroidism, HLD  Medications- reviewed and updated Current Outpatient Prescriptions  Medication Sig Dispense Refill  . chlorthalidone (HYGROTON) 25 MG tablet   NOT TAKING DUE TO COUGH Take 1 tablet (25 mg total) by mouth daily. 30 tablet 5  . clopidogrel (PLAVIX) 75 MG tablet Take 1 tablet (75 mg total) by mouth daily at 12 noon. 90 tablet 3  . COSOPT 22.3-6.8 MG/ML ophthalmic solution   0  . irbesartan (AVAPRO) 150 MG tablet Take 1 tablet (150 mg total) by mouth 2 (two) times daily. 180 tablet 3  . Omega-3 Fatty Acids (OMEGA 3 PO) Take by mouth. One teaspoon daily    . OVER THE COUNTER MEDICATION Nutra Burst one tablespoon with orange juice daily    . thyroid (ARMOUR) 15 MG tablet Take 1 tablet (15 mg total) by mouth daily. 90 tablet 3   No current facility-administered medications for this visit.    Objective: BP 190/72 mmHg  Pulse 47  Temp(Src) 98.5 F (36.9 C)  Wt 127 lb (57.607 kg)  LMP  (Approximate) Gen: NAD, resting comfortably, appears younger than stated age CV:  RRR no murmurs rubs or gallops Lungs: CTAB no crackles, wheeze, rhonchi Abdomen: soft/nontender/nondistended/normal bowel sounds. Ext: no edema Skin: warm, dry, no rash Neuro: grossly normal, moves all extremities, normal gait   Assessment/Plan:  Hypertension Poor control on irbesartan alone. SE of prior meds mentioned below. We will trial very low dose hydralazine at 10mg  TID and follow up in 1 month. I told patient our options are limited. She values quality of life and not having side effects over potential risk of stroke or worsening of PAD. She understands these risks. Could trial spironolactone but worried about hyperkalemia but may have to trial if no success with hydralazine. Possibly could use lasix as well to balance hyperK risk. Very high risk patient with PAD and CVA history and complex decision making required.   HCTZ 12.5mg -burning with urination Amlodipine-burning in shins and edema Beta blocker-cannot tolerate due to brady Hydralazine 50 TID- dizziness Clonidine 0.1 mg- bradycardia Chlorthalidone-cough   Hyperlipidemia Agrees to trial atorvastatin 40mg  once a week but wants to focus on controlling BP first and absolutely does not want to start more than 1 medication at a time.     Meds ordered this encounter  Medications  . hydrALAZINE (APRESOLINE) 10 MG tablet    Sig: Take 1 tablet (10 mg total) by mouth 3 (three) times daily.    Dispense:  90 tablet    Refill:  3

## 2014-06-29 NOTE — Assessment & Plan Note (Signed)
Poor control on irbesartan alone. SE of prior meds mentioned below. We will trial very low dose hydralazine at 10mg  TID and follow up in 1 month. I told patient our options are limited. She values quality of life and not having side effects over potential risk of stroke or worsening of PAD. She understands these risks. Could trial spironolactone but worried about hyperkalemia but may have to trial if no success with hydralazine. Possibly could use lasix as well to balance hyperK risk. Very high risk patient with PAD and CVA history and complex decision making required.   HCTZ 12.5mg -burning with urination Amlodipine-burning in shins and edema Beta blocker-cannot tolerate due to brady Hydralazine 50 TID- dizziness Clonidine 0.1 mg- bradycardia Chlorthalidone-cough

## 2014-08-06 ENCOUNTER — Ambulatory Visit: Payer: Medicare Other | Admitting: Family Medicine

## 2014-08-13 ENCOUNTER — Ambulatory Visit (INDEPENDENT_AMBULATORY_CARE_PROVIDER_SITE_OTHER): Payer: Medicare Other | Admitting: Family Medicine

## 2014-08-13 ENCOUNTER — Ambulatory Visit: Payer: Medicare Other | Admitting: Family Medicine

## 2014-08-13 ENCOUNTER — Encounter: Payer: Self-pay | Admitting: Family Medicine

## 2014-08-13 VITALS — BP 142/82 | HR 54 | Temp 98.4°F | Wt 137.0 lb

## 2014-08-13 DIAGNOSIS — I10 Essential (primary) hypertension: Secondary | ICD-10-CM

## 2014-08-13 DIAGNOSIS — E785 Hyperlipidemia, unspecified: Secondary | ICD-10-CM

## 2014-08-13 DIAGNOSIS — I998 Other disorder of circulatory system: Secondary | ICD-10-CM | POA: Diagnosis not present

## 2014-08-13 DIAGNOSIS — I70229 Atherosclerosis of native arteries of extremities with rest pain, unspecified extremity: Secondary | ICD-10-CM

## 2014-08-13 MED ORDER — ATORVASTATIN CALCIUM 20 MG PO TABS: 20.0000 mg | ORAL_TABLET | ORAL | Status: DC

## 2014-08-13 NOTE — Assessment & Plan Note (Signed)
S: compliant with plavix. Improved BP control but still not ideal for medical management PAD. Intolerant statin in past.  A/P: improved but not controlled BP- no changes. Continue plavix. Trial once weekly statin- if doesn't work, ask Dr. Gwenlyn Found to refer internally to [facility] lipid clinic. Continue regular follow up with cards

## 2014-08-13 NOTE — Patient Instructions (Signed)
Blood pressure is not perfect but it is looking much better and your side effects are lower. I also like the improvement in your home #s. We discussed there is risk to letting your blood pressure run in this range but balance this with side effects and your concerns about medication  We did agree to trial low dose once a week cholesterol medicine to see if we can lower your risk for blockages from that perspective as well.   Follow up in 3-4 months. Keep watching #s at home.   Congrats on 67 years!

## 2014-08-13 NOTE — Assessment & Plan Note (Signed)
Trial atorvastatin 20mg  weekly. Patient with side effects in past but willing to try burst therapy. Discussed benefits/risks. My big concern is history of CVA and PAD

## 2014-08-13 NOTE — Assessment & Plan Note (Signed)
S: mild poor control in office as well as moderate poor control with BPs ranging from 120s to 160s. Overall though this is an improved trend.  A/P: had discussion about benefits/risks of trying another medicine and trying to push BP lower. Patient is resistant to idea and declines. She is aware of the risks of stroke and MI from letting BP run higher but once again emphasizes QOL is important to her and a big part of that is no medication SE. We ultimately decided no changes today.

## 2014-08-13 NOTE — Progress Notes (Signed)
Garret Reddish, MD  Subjective:  INGRI KATA is a 81 y.o. year old very pleasant female patient who presents with:  Hypertension-improved control on hydralazine 10mg  TID along with avapro 150mg   BP Readings from Last 3 Encounters:  08/13/14 142/82  06/29/14 190/72  06/22/14 178/78   Home BP monitoring-usually 150 or so with controlled diastolic (ranging from [id number] to 160s) Compliant with medications-yes without side effects ROS-Denies any CP, HA, SOB, blurry vision, LE edema   Hyperlipidemia-poor control  Lab Results  Component Value Date   LDLCALC 131* 06/22/2014   On statin: no ROS- no chest pain or shortness of breath. No myalgias  Past Medical History- history of PAF last 2006, history CVA, hypothyroidism  Medications- reviewed and updated Current Outpatient Prescriptions  Medication Sig Dispense Refill  . clopidogrel (PLAVIX) 75 MG tablet Take 1 tablet (75 mg total) by mouth daily at 12 noon. 90 tablet 3  . COSOPT 22.3-6.8 MG/ML ophthalmic solution   0  . hydrALAZINE (APRESOLINE) 10 MG tablet Take 1 tablet (10 mg total) by mouth 3 (three) times daily. 90 tablet 3  . irbesartan (AVAPRO) 150 MG tablet Take 1 tablet (150 mg total) by mouth 2 (two) times daily. 180 tablet 3  . Omega-3 Fatty Acids (OMEGA 3 PO) Take by mouth. One teaspoon daily    . OVER THE COUNTER MEDICATION Nutra Burst one tablespoon with orange juice daily    . thyroid (ARMOUR) 15 MG tablet Take 1 tablet (15 mg total) by mouth daily. 90 tablet 3   No current facility-administered medications for this visit.    Objective: BP 142/82 mmHg  Pulse 54  Temp(Src) 98.4 F (36.9 C)  Wt 137 lb (62.143 kg)  LMP  (Approximate)  My manual repeat 148/78. Gen: NAD, resting comfortably CV: RRR no murmurs rubs or gallops Lungs: CTAB no crackles, wheeze, rhonchi Abdomen: soft/nontender/nondistended/normal bowel sounds.  Ext: no edema Skin: warm, dry Neuro: grossly normal, moves all extremities, normal gait    Assessment/Plan:  Hypertension S: mild poor control in office as well as moderate poor control with BPs ranging from 120s to 160s. Overall though this is an improved trend.  A/P: had discussion about benefits/risks of trying another medicine and trying to push BP lower. Patient is resistant to idea and declines. She is aware of the risks of stroke and MI from letting BP run higher but once again emphasizes QOL is important to her and a big part of that is no medication SE. We ultimately decided no changes today.    Hyperlipidemia Trial atorvastatin 20mg  weekly. Patient with side effects in past but willing to try burst therapy. Discussed benefits/risks. My big concern is history of CVA and PAD  Critical lower limb ischemia/PAD S: compliant with plavix. Improved BP control but still not ideal for medical management PAD. Intolerant statin in past.  A/P: improved but not controlled BP- no changes. Continue plavix. Trial once weekly statin- if doesn't work, ask Dr. Gwenlyn Found to refer internally to [facility] lipid clinic. Continue regular follow up with cards    3-4 month f/u. Ask how 67th anniversary went.    Meds ordered this encounter  Medications  . atorvastatin (LIPITOR) 20 MG tablet    Sig: Take 1 tablet (20 mg total) by mouth once a week.    Dispense:  30 tablet    Refill:  1

## 2014-09-13 DIAGNOSIS — H2512 Age-related nuclear cataract, left eye: Secondary | ICD-10-CM | POA: Diagnosis not present

## 2014-09-13 DIAGNOSIS — H25012 Cortical age-related cataract, left eye: Secondary | ICD-10-CM | POA: Diagnosis not present

## 2014-09-13 DIAGNOSIS — H4011X2 Primary open-angle glaucoma, moderate stage: Secondary | ICD-10-CM | POA: Diagnosis not present

## 2014-09-18 ENCOUNTER — Other Ambulatory Visit: Payer: Self-pay

## 2014-09-18 DIAGNOSIS — Z1231 Encounter for screening mammogram for malignant neoplasm of breast: Secondary | ICD-10-CM

## 2014-09-25 ENCOUNTER — Ambulatory Visit
Admission: RE | Admit: 2014-09-25 | Discharge: 2014-09-25 | Disposition: A | Discharge status: Home / Self Care | Payer: Medicare Other | Source: Ambulatory Visit

## 2014-09-25 DIAGNOSIS — Z1231 Encounter for screening mammogram for malignant neoplasm of breast: Secondary | ICD-10-CM | POA: Diagnosis not present

## 2014-10-11 ENCOUNTER — Telehealth: Payer: Self-pay | Admitting: Family Medicine

## 2014-10-11 ENCOUNTER — Encounter (HOSPITAL_COMMUNITY): Payer: Self-pay | Admitting: *Deleted

## 2014-10-11 ENCOUNTER — Other Ambulatory Visit: Payer: Self-pay

## 2014-10-11 ENCOUNTER — Emergency Department (HOSPITAL_COMMUNITY): Payer: Medicare Other

## 2014-10-11 ENCOUNTER — Emergency Department (HOSPITAL_COMMUNITY)
Admission: EM | Admit: 2014-10-11 | Discharge: 2014-10-12 | Disposition: A | Discharge status: Home / Self Care | Payer: Medicare Other | Attending: Emergency Medicine | Admitting: Emergency Medicine

## 2014-10-11 DIAGNOSIS — I10 Essential (primary) hypertension: Secondary | ICD-10-CM | POA: Insufficient documentation

## 2014-10-11 DIAGNOSIS — R0789 Other chest pain: Secondary | ICD-10-CM | POA: Insufficient documentation

## 2014-10-11 DIAGNOSIS — E039 Hypothyroidism, unspecified: Secondary | ICD-10-CM | POA: Diagnosis not present

## 2014-10-11 DIAGNOSIS — Z79899 Other long term (current) drug therapy: Secondary | ICD-10-CM | POA: Insufficient documentation

## 2014-10-11 DIAGNOSIS — I480 Paroxysmal atrial fibrillation: Secondary | ICD-10-CM | POA: Diagnosis not present

## 2014-10-11 DIAGNOSIS — R079 Chest pain, unspecified: Secondary | ICD-10-CM | POA: Diagnosis present

## 2014-10-11 DIAGNOSIS — Z8673 Personal history of transient ischemic attack (TIA), and cerebral infarction without residual deficits: Secondary | ICD-10-CM | POA: Diagnosis not present

## 2014-10-11 DIAGNOSIS — Z8619 Personal history of other infectious and parasitic diseases: Secondary | ICD-10-CM | POA: Insufficient documentation

## 2014-10-11 DIAGNOSIS — H409 Unspecified glaucoma: Secondary | ICD-10-CM | POA: Diagnosis not present

## 2014-10-11 DIAGNOSIS — E780 Pure hypercholesterolemia: Secondary | ICD-10-CM | POA: Insufficient documentation

## 2014-10-11 LAB — CBC
HCT: 35.8 % — ABNORMAL LOW (ref 36.0–46.0)
Hemoglobin: 11.7 g/dL — ABNORMAL LOW (ref 12.0–15.0)
MCH: 26.8 pg (ref 26.0–34.0)
MCHC: 32.7 g/dL (ref 30.0–36.0)
MCV: 82.1 fL (ref 78.0–100.0)
PLATELETS: 172 10*3/uL (ref 150–400)
RBC: 4.36 MIL/uL (ref 3.87–5.11)
RDW: 14.3 % (ref 11.5–15.5)
WBC: 3.8 10*3/uL — ABNORMAL LOW (ref 4.0–10.5)

## 2014-10-11 LAB — BASIC METABOLIC PANEL
Anion gap: 8 (ref 5–15)
BUN: 10 mg/dL (ref 6–20)
CALCIUM: 9.2 mg/dL (ref 8.9–10.3)
CHLORIDE: 107 mmol/L (ref 101–111)
CO2: 26 mmol/L (ref 22–32)
CREATININE: 1.05 mg/dL — AB (ref 0.44–1.00)
GFR calc non Af Amer: 47 mL/min — ABNORMAL LOW (ref >60)
GFR, EST AFRICAN AMERICAN: 54 mL/min — AB (ref >60)
GLUCOSE: 109 mg/dL — AB (ref 65–99)
Potassium: 3.9 mmol/L (ref 3.5–5.1)
Sodium: 141 mmol/L (ref 135–145)

## 2014-10-11 LAB — PROTIME-INR
INR: 1.03 (ref 0.00–1.49)
Prothrombin Time: 13.7 seconds (ref 11.6–15.2)

## 2014-10-11 LAB — I-STAT TROPONIN, ED
TROPONIN I, POC: 0.01 ng/mL (ref 0.00–0.08)
Troponin i, poc: 0.01 ng/mL (ref 0.00–0.08)

## 2014-10-11 NOTE — Telephone Encounter (Signed)
[facility] - Client [place] Patient Name: [name] Gender: Unknown DOB: 11/02/1925 Age: 81 Y 11 M 9 D Return Phone Number: [phone] (Primary), [phone] (Secondary) Address: City/State/Zip: [place] Client [facility] - Client Client Site [place] - Day Physician Garret Reddish Contact Type Call Call Type Triage / Clinical Relationship To Patient Self Appointment Disposition EMR Appointment Not Necessary Info pasted into Epic No Return Phone Number Please choose phone number Chief Complaint CHEST PAIN (>=21 years) - pain, pressure, heaviness or tightness Initial Comment Caller states that she has prickly pain around her heart PreDisposition Call Doctor Nurse Assessment Nurse: Celine Ahr, RN, [place] Date/Time (Eastern Time): 10/11/2014 4:37:26 PM Confirm and document reason for call. If symptomatic, describe symptoms. ---Caller states she is having a prickly feeling in her left upper chest area. Caller states it started a couple of days ago and progressively getting worse. Has the patient traveled out of the country within the last 30 days? ---Not Applicable Does the patient require triage? ---Yes Related visit to physician within the last 2 weeks? ---No Does the PT have any chronic conditions? (i.e. diabetes, asthma, etc.) ---Yes List chronic conditions. ---HBP, history of blood clots. Guidelines Guideline Title Affirmed Question Affirmed Notes Nurse Date/Time Eilene Ghazi Time) Chest Pain History of prior "blood clot" in leg or lungs (i.e., deep vein thrombosis, pulmonary embolism) Alyse Low 10/11/2014 4:36:54 PM Disp. Time Eilene Ghazi Time) Disposition Final User 10/11/2014 4:32:45 PM Send to Urgent Queue [place] November 10/11/2014 4:33:02 PM Send to Urgent Queue [place] November 10/11/2014 4:47:28 PM Go to ED Now Yes Celine Ahr, RN,  Ellouise Newer Understands: Yes Disagree/Comply: Comply Care Advice Given Per Guideline GO TO ED NOW: You need to be seen in the Emergency Department. Go to the ER at [place] now. Drive carefully. DRIVING: Another adult should drive. CARE ADVICE given per Chest Pain (Adult) guideline.

## 2014-10-11 NOTE — ED Notes (Signed)
Pt reports left sided chest pain for a couple of days. Pt states she had a mammogram done a week ago and thought she was sore from the procedure. Pt states she contacted her PCP and was instructed to come to the ED for further evaluation. Pt denies any associated symptoms with the chest pain.

## 2014-10-11 NOTE — Discharge Instructions (Signed)

## 2014-10-11 NOTE — ED Provider Notes (Signed)
CSN: [id number]     Arrival date & time 10/11/14  1905 History   First MD Initiated Contact with Patient 10/11/14 2029     Chief Complaint  Patient presents with  . Chest Pain     (Consider location/radiation/quality/duration/timing/severity/associated sxs/prior Treatment) Patient is a 81 y.o. female presenting with chest pain. The history is provided by the patient and the spouse. No language interpreter was used.  Chest Pain Pain location:  L chest Pain quality: not aching, not crushing, no pressure, not sharp, not stabbing and not throbbing   Pain quality comment:  Describes as "prickling" Pain radiates to:  Does not radiate Pain radiates to the back: no   Pain severity:  Mild Onset quality:  Gradual Duration:  2 weeks Timing:  Intermittent Progression:  Unchanged Chronicity:  New Context: lifting (with moving L arm) and trauma (mammogram 2 weeks ago, pain since then)   Context: not breathing, not eating, not at rest and no stress   Relieved by:  None tried Worsened by:  Nothing tried Ineffective treatments:  None tried Associated symptoms: no abdominal pain, no anorexia, no anxiety, no back pain, no claudication, no cough, no diaphoresis, no dizziness, no fatigue, no fever, no nausea, no near-syncope, no numbness, no palpitations, no shortness of breath, no syncope, not vomiting and no weakness   Risk factors: hypertension and prior DVT/PE (R lower leg DVT)   Risk factors: no coronary artery disease, no diabetes mellitus, not female, not obese and no smoking     Past Medical History  Diagnosis Date  . Hypertension     a. Normal renal arteries by duplex 2013.  [name] PAF (paroxysmal atrial fibrillation)     a. s/p MAZE 2006. b. Event monitor 2013: NSR with PACs.  . Hypothyroidism   . PAD (peripheral artery disease)     a. 04/2013: s/p PCI to occluded right popliteal artery   . High cholesterol   . Migraine   . Stroke     a. Pt reports 3-4 prior strokes without residual sx. b.  CVA 2013 (presented with headache) - started on Plavix. Event monitor as outpt - no AF.  [name] Arthritis   . Glaucoma of both eyes   . Sinus bradycardia     a. 03/2011 during hospital admission - beta blocker stopped.  . Chicken pox    Past Surgical History  Procedure Laterality Date  . Maze  ~ 2006  . Popliteal artery stent  05/15/2013  . Lower extremity angiogram N/A 05/15/2013    Procedure: LOWER EXTREMITY ANGIOGRAM;  Surgeon: Lorretta Harp, MD;  Location: [facility] CATH LAB;  Service: Cardiovascular;  Laterality: N/A;   Family History  Problem Relation Age of Onset  . Hypertension Mother   . Ulcers Father   . Cervical cancer Sister   . Throat cancer Brother   . Lung cancer Brother   . Cancer Brother    Social History  Substance Use Topics  . Smoking status: Never Smoker   . Smokeless tobacco: Never Used  . Alcohol Use: No   OB History    No data available     Review of Systems  Constitutional: Negative for fever, diaphoresis and fatigue.  Respiratory: Negative for cough and shortness of breath.   Cardiovascular: Positive for chest pain. Negative for palpitations, claudication, syncope and near-syncope.  Gastrointestinal: Negative for nausea, vomiting, abdominal pain and anorexia.  Musculoskeletal: Negative for back pain.  Neurological: Negative for dizziness, weakness and numbness.  All other systems  reviewed and are negative.     Allergies  Aceon; Amlodipine; Aspirin; Azor; Beta adrenergic blockers; Hydralazine; and Vitamin d analogs  Home Medications   Prior to Admission medications   Medication Sig Start Date End Date Taking? Authorizing Provider  atorvastatin (LIPITOR) 20 MG tablet Take 1 tablet (20 mg total) by mouth once a week. 08/13/14  Yes [name], MD  clopidogrel (PLAVIX) 75 MG tablet Take 1 tablet (75 mg total) by mouth daily at 12 noon. 06/07/14  Yes [name], MD  COSOPT 22.3-6.8 MG/ML ophthalmic solution Place 1 drop into both eyes 2 (two)  times daily.  04/24/14  Yes Historical Provider, MD  hydrALAZINE (APRESOLINE) 10 MG tablet Take 1 tablet (10 mg total) by mouth 3 (three) times daily. 06/29/14  Yes [name], MD  irbesartan (AVAPRO) 150 MG tablet Take 1 tablet (150 mg total) by mouth 2 (two) times daily. 06/07/14  Yes [name], MD  Omega-3 Fatty Acids (OMEGA 3 PO) Take 5 mLs by mouth daily at 3 pm. One teaspoon daily   Yes Historical Provider, MD  OVER THE COUNTER MEDICATION Nutra Burst one tablespoon with orange juice daily   Yes Historical Provider, MD  thyroid (ARMOUR) 15 MG tablet Take 1 tablet (15 mg total) by mouth daily. 06/07/14  Yes [name], MD   BP 210/69 mmHg  Pulse 52  Temp(Src) 98 F (36.7 C) (Oral)  Resp 19  Ht 5\' 3"  (1.6 m)  Wt 137 lb (62.143 kg)  BMI 24.27 kg/m2  SpO2 95%  LMP  (Approximate) Physical Exam  Constitutional: She is oriented to person, place, and time. No distress.  Appears younger than stated age  HENT:  Head: Normocephalic and atraumatic.  Eyes: Conjunctivae and EOM are normal.  Neck: Normal range of motion. Neck supple.  Cardiovascular: Normal rate, regular rhythm and normal heart sounds.   No murmur heard. Pulmonary/Chest: Effort normal and breath sounds normal.  Abdominal: Soft. Bowel sounds are normal. There is no tenderness. There is no rebound and no guarding.  Musculoskeletal: Normal range of motion. She exhibits no tenderness.  Neurological: She is alert and oriented to person, place, and time.  Skin: Skin is warm and dry. She is not diaphoretic.  Psychiatric: Her behavior is normal.  Nursing note and vitals reviewed.   ED Course  Procedures (including critical care time) Labs Review Labs Reviewed  BASIC METABOLIC PANEL - Abnormal; Notable for the following:    Glucose, Bld 109 (*)    Creatinine, Ser 1.05 (*)    GFR calc non Af Amer 47 (*)    GFR calc Af Amer 54 (*)    All other components within normal limits  CBC - Abnormal; Notable for the  following:    WBC 3.8 (*)    Hemoglobin 11.7 (*)    HCT 35.8 (*)    All other components within normal limits  PROTIME-INR  I-STAT TROPOININ, ED  Randolm Idol, ED    Imaging Review Dg Chest 2 View  10/11/2014   CLINICAL DATA:  Upper left chest pain for the past 2 days.  EXAM: CHEST  2 VIEW  COMPARISON:  08/07/2013.  FINDINGS: Borderline enlarged cardiac silhouette. Left mediastinal surgical clips. Mildly prominent interstitial markings. Diffuse osteopenia. Lead artifacts.  IMPRESSION: No acute abnormality.   Electronically Signed   By: Claudie Revering M.D.   On: 10/11/2014 20:15   I have personally reviewed and evaluated these images and lab results as part  of my medical decision-making.  ED ECG REPORT   Date: 10/12/2014  Rate: 54  Rhythm: sinus bradycardia  QRS Axis: normal  Intervals: normal  ST/T Wave abnormalities: nonspecific T wave changes  Conduction Disutrbances:none  Narrative Interpretation:   Old EKG Reviewed: no significant change from prior  I have personally reviewed the EKG tracing and agree with the computerized printout as noted.   MDM   Final diagnoses:  Atypical chest pain    Patient is an 80 year old female who presents today with left upper chest pain that started approximately 2 weeks ago. On exam patient is awake and alert and in no acute distress. He shouldn't is hypertensive but she does notably admit that she has been difficult to control hypertension and has been working with her primary care doctor to help fix this. Patient's chest pain seemed atypical as she had described it as prickling and ongoing for up to 2 weeks after her mammogram. Obtained a delta troponin both of which were negative. No significant ischemic changes were appreciated on her EKG today. The reassuring exam and findings today we'll discharge the patient home with return precautions and place. Patient was agreeable with this plan and was discharged home in good  condition.    Theodosia Quay, MD 10/12/14 0128  Carmin Muskrat, MD 10/15/14 [phone]

## 2014-10-11 NOTE — ED Notes (Signed)
Resident MD at bedside.

## 2014-10-12 NOTE — Telephone Encounter (Signed)
Patient checked into ED 

## 2014-10-12 NOTE — ED Notes (Signed)
Pt left at this time with all belongings.  

## 2014-10-12 NOTE — Telephone Encounter (Signed)
FYI

## 2014-10-25 DIAGNOSIS — R1032 Left lower quadrant pain: Secondary | ICD-10-CM | POA: Diagnosis not present

## 2014-10-25 DIAGNOSIS — N8189 Other female genital prolapse: Secondary | ICD-10-CM | POA: Diagnosis not present

## 2014-10-30 DIAGNOSIS — R1904 Left lower quadrant abdominal swelling, mass and lump: Secondary | ICD-10-CM | POA: Diagnosis not present

## 2014-10-30 DIAGNOSIS — N760 Acute vaginitis: Secondary | ICD-10-CM | POA: Diagnosis not present

## 2014-10-30 DIAGNOSIS — R1900 Intra-abdominal and pelvic swelling, mass and lump, unspecified site: Secondary | ICD-10-CM | POA: Diagnosis not present

## 2015-01-05 IMAGING — MG MM SCREEN MAMMOGRAM BILATERAL
4 series · 4 of 4 positions shown · non-contrast
Comparison: Previous exam(s).

CLINICAL DATA: Screening.

EXAM:
DIGITAL SCREENING BILATERAL MAMMOGRAM WITH CAD

[R CC]
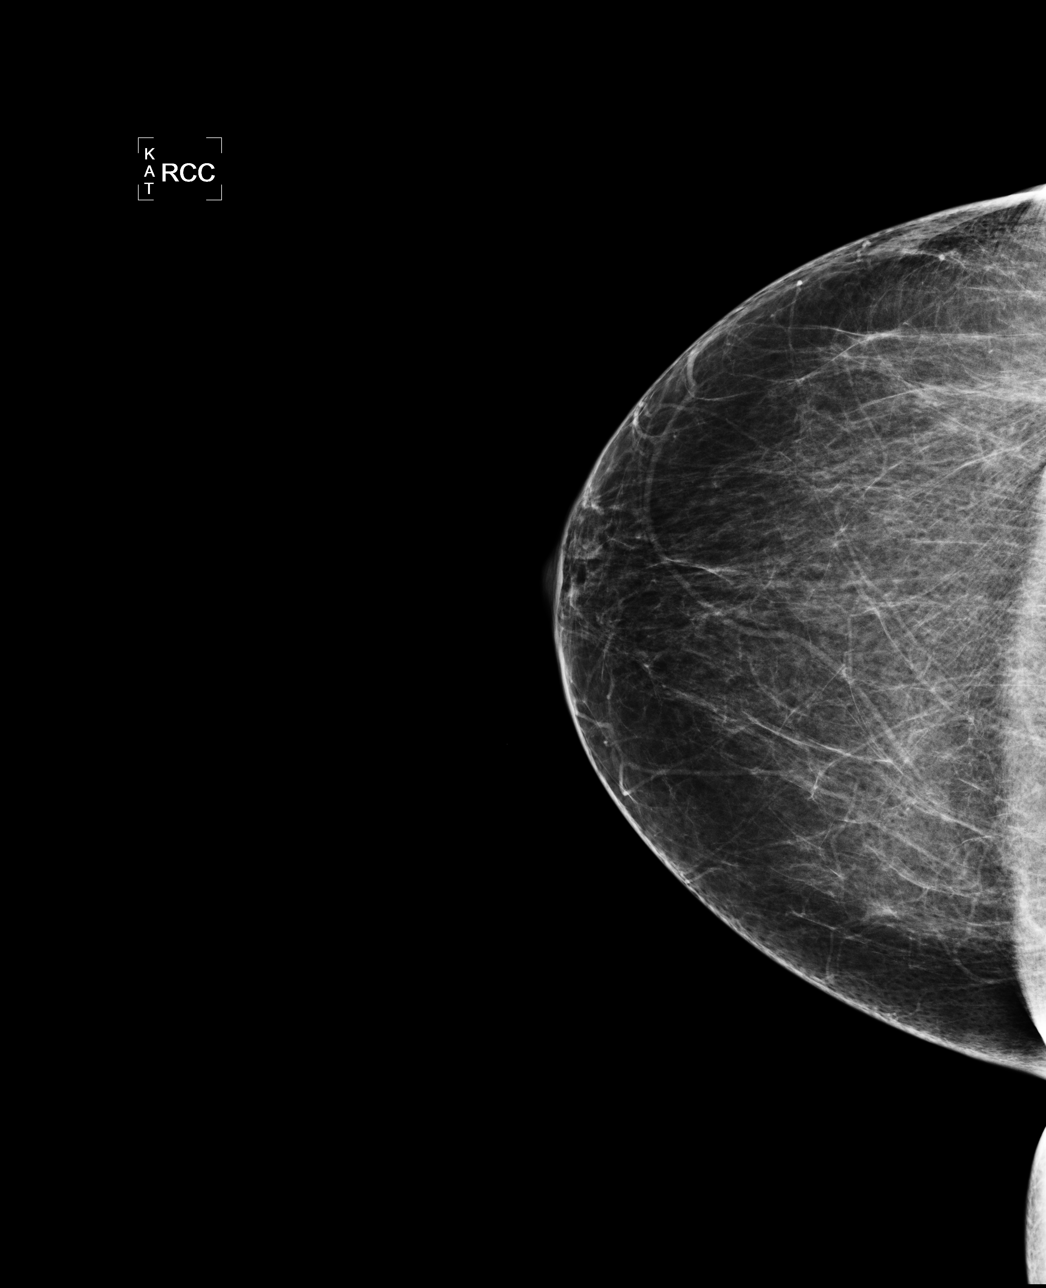

[L CC]
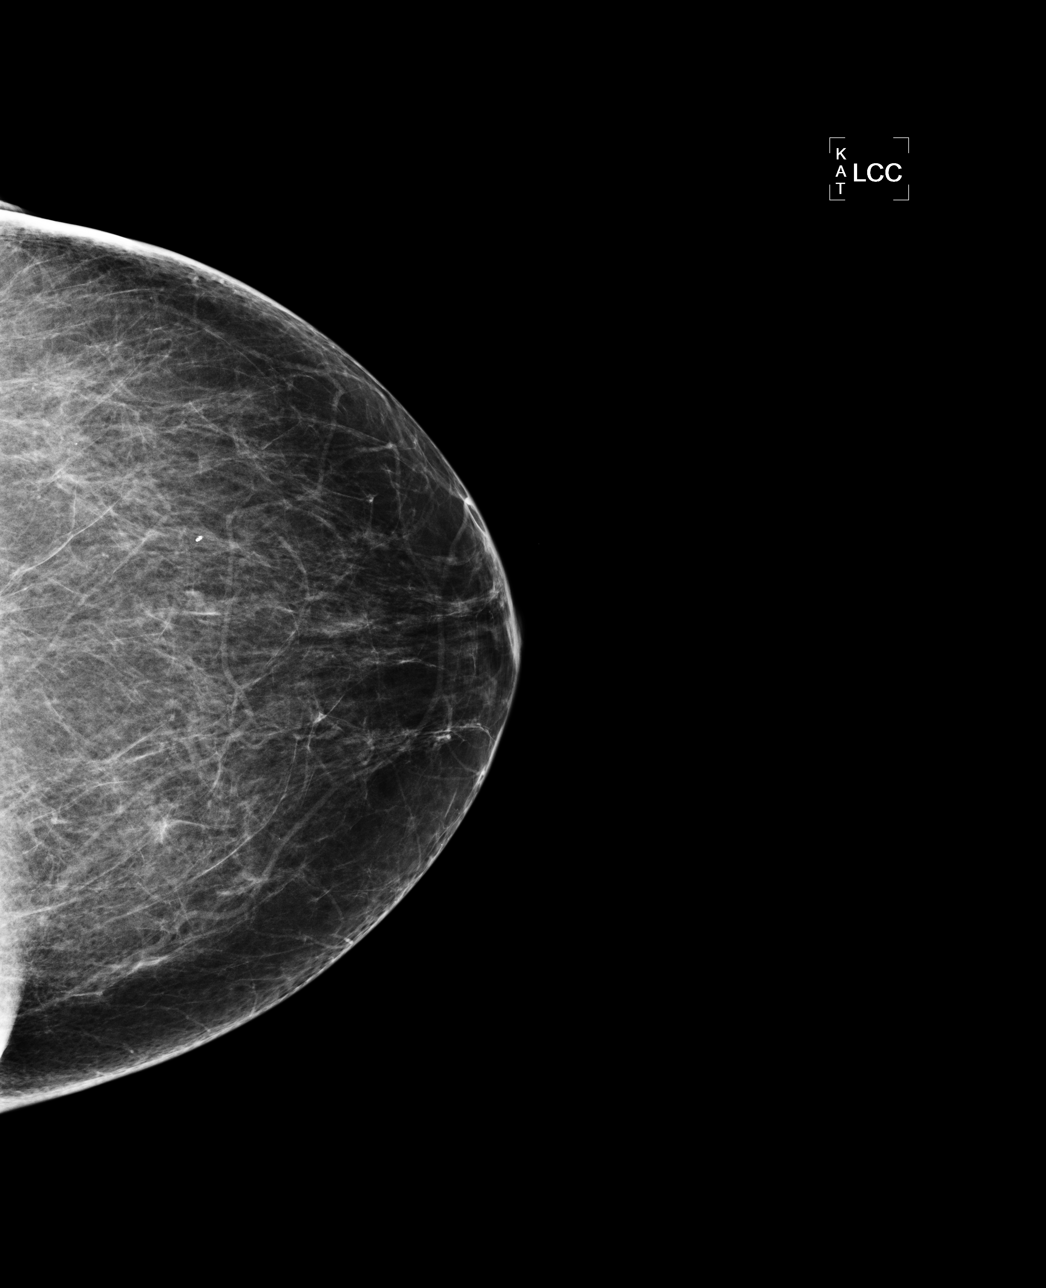

[L MLO]
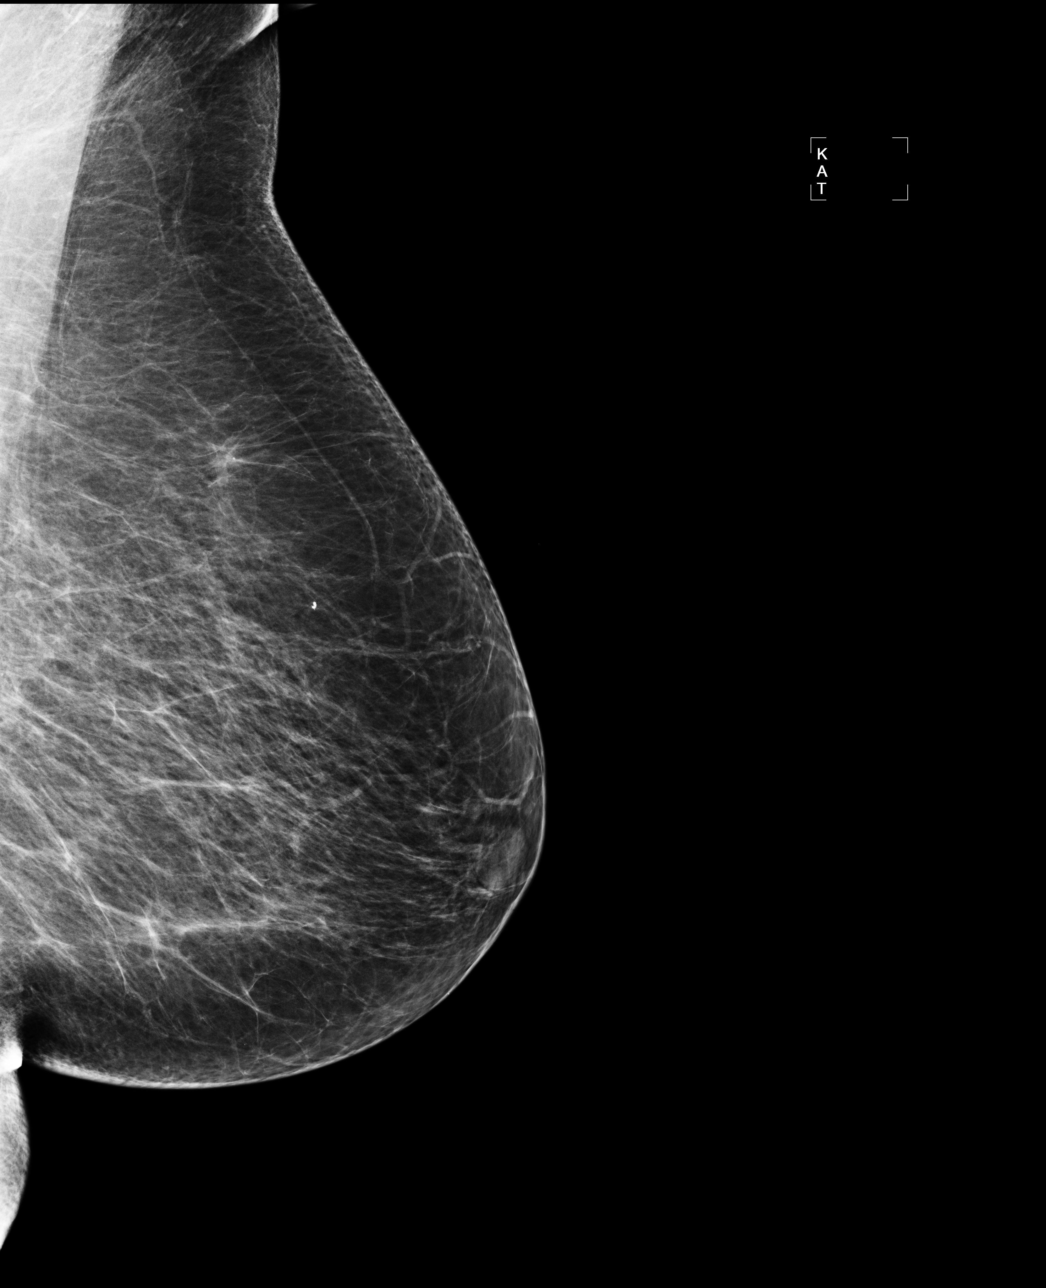

[R MLO]
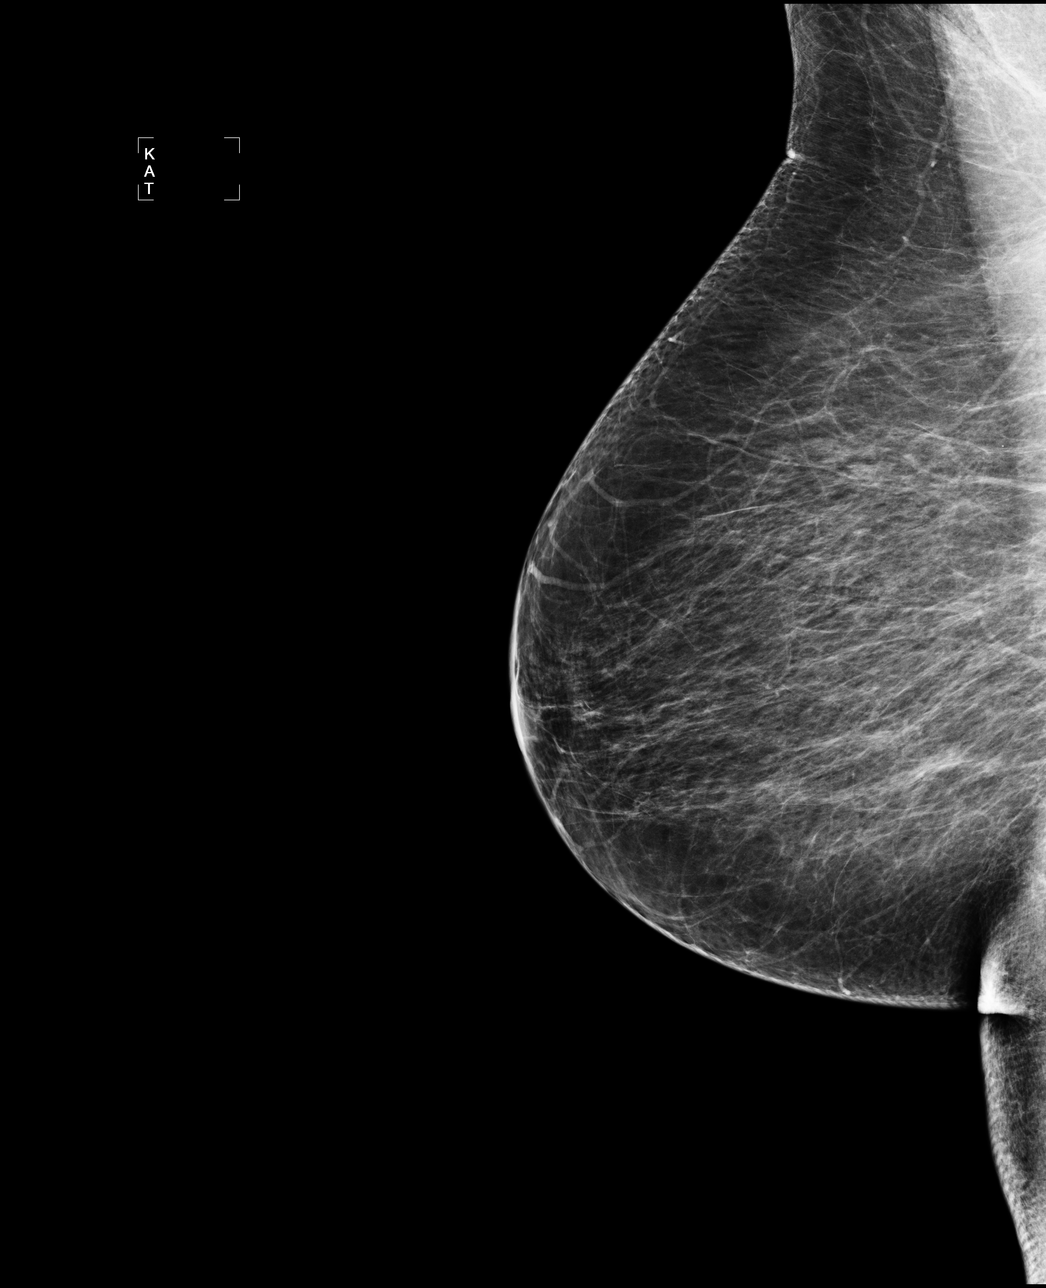

[4 of 4 positions shown; findings below may reference images not displayed]

ACR Breast Density Category b: There are scattered areas of
fibroglandular density.
FINDINGS: There are no findings suspicious for malignancy. Images were
processed with CAD.
IMPRESSION: No mammographic evidence of malignancy. A result letter of this
screening mammogram will be mailed directly to the patient.

RECOMMENDATION:
Screening mammogram in one year. (Code:AS-G-LCT)

BI-RADS CATEGORY  1: Negative.

## 2015-01-08 ENCOUNTER — Other Ambulatory Visit: Payer: Self-pay | Admitting: Family Medicine

## 2015-01-21 ENCOUNTER — Ambulatory Visit: Payer: Medicare Other | Admitting: Family Medicine

## 2015-01-28 ENCOUNTER — Encounter: Payer: Self-pay | Admitting: Family Medicine

## 2015-01-28 ENCOUNTER — Ambulatory Visit (INDEPENDENT_AMBULATORY_CARE_PROVIDER_SITE_OTHER): Payer: Medicare Other | Admitting: Family Medicine

## 2015-01-28 VITALS — BP 202/96 | HR 60 | Temp 98.6°F | Wt 139.0 lb

## 2015-01-28 DIAGNOSIS — E785 Hyperlipidemia, unspecified: Secondary | ICD-10-CM

## 2015-01-28 DIAGNOSIS — I10 Essential (primary) hypertension: Secondary | ICD-10-CM | POA: Diagnosis not present

## 2015-01-28 MED ORDER — CHLORTHALIDONE 25 MG PO TABS: 25.0000 mg | ORAL_TABLET | Freq: Every day | ORAL | Status: DC

## 2015-01-28 NOTE — Assessment & Plan Note (Signed)
S: poorly controlled on no statin considering hx CVA and LDL goal <70. Stopped once weekly atorvastatin as complained of cough  Lab Results  Component Value Date   CHOL 205* 06/22/2014   HDL 61.80 06/22/2014   LDLCALC 131* 06/22/2014   TRIG 59.0 06/22/2014   CHOLHDL 3 06/22/2014   A/P: once weekly atorvastatin failed. Likely patient attributes symptoms she is having to medication even if unrelated. Will trial crestor after BP in better place.

## 2015-01-28 NOTE — Patient Instructions (Signed)
Start Chlorthalidone 12.5 mg (only take 1/2 of the 25mg  tab to start) Continue avapro 150mg  twice a day as well as hydralazine 3x a day  Follow up 1 month Please try to give this medicine at least a month trial (and be on it when you return)  Schedule AWV at front desk for next available

## 2015-01-28 NOTE — Assessment & Plan Note (Addendum)
S: poorly controlled. Compliant with  irbesartan 150mg  BID, hydralazine 10mg  TID. Complains of ankle burning with this combo less severe than when on norvasc. Wants to avoid CVA BP Readings from Last 3 Encounters:  01/28/15 202/96  10/11/14 210/69  08/13/14 142/82  A/P:Continue current meds but add chlorthalidone at 12.5mg . Strongly encouraged patient to trial for full month. States she "coughed her back out" on prior medicine and tessalon pearls "turned her inside out" and confusion on tussionex so no cough aid provided.

## 2015-01-28 NOTE — Progress Notes (Signed)
Garret Reddish, MD  Subjective:  [name] is a 81 y.o. year old very pleasant female patient who presents for/with See problem oriented charting ROS- No chest pain or shortness of breath. No headache or blurry vision. No slurred speech or difficulty swallowing.   Past Medical History-  Patient Active Problem List   Diagnosis Date Noted  . Critical lower limb ischemia/PAD 05/10/2013    Priority: High  . PAF (paroxysmal atrial fibrillation) (HCC)     Priority: High  . Stroke [facility])     Priority: High  . Hypothyroidism 06/07/2014    Priority: Medium  . Hyperlipidemia 06/07/2014    Priority: Medium  . Hypertension     Priority: Medium  . GERD (gastroesophageal reflux disease) 06/25/2014    Priority: Low  . Hyperglycemia 06/25/2014    Priority: Low  . Vitamin D deficiency 06/25/2014    Priority: Low  . Intracervical pessary 06/07/2014    Priority: Low  . Sinus bradycardia 05/19/2013    Priority: Low  . PAC (premature atrial contraction) 05/20/2011    Priority: Low    Medications- reviewed and updated Current Outpatient Prescriptions  Medication Sig Dispense Refill  . atorvastatin (LIPITOR) 20 MG tablet Take 1 tablet (20 mg total) by mouth once a week. 30 tablet 1  . clopidogrel (PLAVIX) 75 MG tablet Take 1 tablet (75 mg total) by mouth daily at 12 noon. 90 tablet 3  . COSOPT 22.3-6.8 MG/ML ophthalmic solution Place 1 drop into both eyes 2 (two) times daily.   0  . hydrALAZINE (APRESOLINE) 10 MG tablet take 1 tablet by mouth three times a day 90 tablet 3  . irbesartan (AVAPRO) 150 MG tablet Take 1 tablet (150 mg total) by mouth 2 (two) times daily. 180 tablet 3  . Omega-3 Fatty Acids (OMEGA 3 PO) Take 5 mLs by mouth daily at 3 pm. One teaspoon daily    . OVER THE COUNTER MEDICATION Nutra Burst one tablespoon with orange juice daily    . thyroid (ARMOUR) 15 MG tablet Take 1 tablet (15 mg total) by mouth daily. 90 tablet 3   Objective: BP 202/96 mmHg  Pulse 60   Temp(Src) 98.6 F (37 C)  Wt 139 lb (63.05 kg)  LMP  (Approximate) Gen: NAD, resting comfortably, wearing gown CV: RRR no murmurs rubs or gallops Lungs: CTAB no crackles, wheeze, rhonchi Abdomen: soft/nontender/nondistended/normal bowel sounds. No rebound or guarding.  Ext: no edema Skin: warm, dry Neuro: grossly normal, moves all extremities  Assessment/Plan:  Hyperlipidemia S: poorly controlled on no statin considering hx CVA and LDL goal <70. Stopped once weekly atorvastatin as complained of cough  Lab Results  Component Value Date   CHOL 205* 06/22/2014   HDL 61.80 06/22/2014   LDLCALC 131* 06/22/2014   TRIG 59.0 06/22/2014   CHOLHDL 3 06/22/2014   A/P: once weekly atorvastatin failed. Likely patient attributes symptoms she is having to medication even if unrelated. Will trial crestor after BP in better place.     Hypertension S: poorly controlled. Compliant with  irbesartan 150mg  BID, hydralazine 10mg  TID. Complains of ankle burning with this combo less severe than when on norvasc. Wants to avoid CVA BP Readings from Last 3 Encounters:  01/28/15 202/96  10/11/14 210/69  08/13/14 142/82  A/P:Continue current meds but add chlorthalidone at 12.5mg . Strongly encouraged patient to trial for full month. States she "coughed her back out" on prior medicine and tessalon pearls "turned her inside out" and confusion on tussionex so no  cough aid provided.     Emergent return precautions advised.   Meds ordered this encounter  Medications  . chlorthalidone (HYGROTON) 25 MG tablet    Sig: Take 1 tablet (25 mg total) by mouth daily.    Dispense:  30 tablet    Refill:  5

## 2015-02-04 DIAGNOSIS — N83209 Unspecified ovarian cyst, unspecified side: Secondary | ICD-10-CM | POA: Diagnosis not present

## 2015-02-26 DIAGNOSIS — H401132 Primary open-angle glaucoma, bilateral, moderate stage: Secondary | ICD-10-CM | POA: Diagnosis not present

## 2015-02-26 DIAGNOSIS — H31002 Unspecified chorioretinal scars, left eye: Secondary | ICD-10-CM | POA: Diagnosis not present

## 2015-02-26 DIAGNOSIS — Z961 Presence of intraocular lens: Secondary | ICD-10-CM | POA: Diagnosis not present

## 2015-02-26 DIAGNOSIS — H2512 Age-related nuclear cataract, left eye: Secondary | ICD-10-CM | POA: Diagnosis not present

## 2015-03-07 ENCOUNTER — Encounter: Payer: Self-pay | Admitting: Family Medicine

## 2015-03-07 ENCOUNTER — Ambulatory Visit (INDEPENDENT_AMBULATORY_CARE_PROVIDER_SITE_OTHER): Payer: Managed Care, Other (non HMO) | Admitting: Family Medicine

## 2015-03-07 VITALS — BP 148/64 | HR 50 | Temp 97.7°F | Wt 137.0 lb

## 2015-03-07 DIAGNOSIS — I10 Essential (primary) hypertension: Secondary | ICD-10-CM

## 2015-03-07 NOTE — Progress Notes (Signed)
Garret Reddish, MD  Subjective:  [name] is a 82 y.o. year old very pleasant female patient who presents for/with See problem oriented charting ROS- No chest pain or shortness of breath. No headache or blurry vision.  Does have some burning in her shins. Occasional cough (worried about this as SE of medicine)  Past Medical History-  Patient Active Problem List   Diagnosis Date Noted  . Critical lower limb ischemia/PAD 05/10/2013    Priority: High  . PAF (paroxysmal atrial fibrillation) (HCC)     Priority: High  . Stroke [facility])     Priority: High  . Hypothyroidism 06/07/2014    Priority: Medium  . Hyperlipidemia 06/07/2014    Priority: Medium  . Hypertension     Priority: Medium  . GERD (gastroesophageal reflux disease) 06/25/2014    Priority: Low  . Hyperglycemia 06/25/2014    Priority: Low  . Vitamin D deficiency 06/25/2014    Priority: Low  . Intracervical pessary 06/07/2014    Priority: Low  . Sinus bradycardia 05/19/2013    Priority: Low  . PAC (premature atrial contraction) 05/20/2011    Priority: Low    Medications- reviewed and updated Current Outpatient Prescriptions  Medication Sig Dispense Refill  . atorvastatin (LIPITOR) 20 MG tablet Take 1 tablet (20 mg total) by mouth once a week. 30 tablet 1  . chlorthalidone (HYGROTON) 25 MG tablet Take 1 tablet (25 mg total) by mouth daily. 30 tablet 5  . clopidogrel (PLAVIX) 75 MG tablet Take 1 tablet (75 mg total) by mouth daily at 12 noon. 90 tablet 3  . COSOPT 22.3-6.8 MG/ML ophthalmic solution Place 1 drop into both eyes 2 (two) times daily.   0  . hydrALAZINE (APRESOLINE) 10 MG tablet take 1 tablet by mouth three times a day 90 tablet 3  . irbesartan (AVAPRO) 150 MG tablet Take 1 tablet (150 mg total) by mouth 2 (two) times daily. 180 tablet 3  . Omega-3 Fatty Acids (OMEGA 3 PO) Take 5 mLs by mouth daily at 3 pm. One teaspoon daily    . OVER THE COUNTER MEDICATION Nutra Burst one tablespoon with orange juice  daily    . thyroid (ARMOUR) 15 MG tablet Take 1 tablet (15 mg total) by mouth daily. 90 tablet 3   No current facility-administered medications for this visit.    Objective: BP 148/64 mmHg  Pulse 50  Temp(Src) 97.7 F (36.5 C)  Wt 137 lb (62.143 kg)  LMP  (Approximate) Gen: NAD, resting comfortably CV: RRR no murmurs rubs or gallops Lungs: CTAB no crackles, wheeze, rhonchi Abdomen: soft/nontender/nondistended/normal bowel sounds. No rebound or guarding.  Ext: no edema Skin: warm, dry Neuro: grossly normal, moves all extremities  BP repeat similar  Assessment/Plan:  Hypertension S: drastically improved control but still mildly poorly controlled. Compliant with irbesartan 150mg  BID and chlorthalidone 12.5mg . When she was also taking hydralazine TID was getting BP as low as 120/50 and felt dizzy so she pulled back to once a day. Home readings 130s mainly but sometims into the 140s. She does have some burning in her shins but she is tolerating it.  BP Readings from Last 3 Encounters:  03/07/15 148/64  01/28/15 202/96  10/11/14 210/69  A/P:Continue current meds but try to push hydralazine to twice a day. If this doesn't work, we may try full tab chlorthalidone and stopping hydralazine. With CVA history, really want BPs under 140/90. Much closer today than previous.     Return in about 3  months (around 06/05/2015) for follow up- or sooner if needed. Appears has visit in march -reasonable to keep this. Discuss other medical issues at that visit as well.  Sooner Return precautions advised.

## 2015-03-07 NOTE — Assessment & Plan Note (Signed)
S: drastically improved control but still mildly poorly controlled. Compliant with irbesartan 150mg  BID and chlorthalidone 12.5mg . When she was also taking hydralazine TID was getting BP as low as 120/50 and felt dizzy so she pulled back to once a day. Home readings 130s mainly but sometims into the 140s. She does have some burning in her shins but she is tolerating it.  BP Readings from Last 3 Encounters:  03/07/15 148/64  01/28/15 202/96  10/11/14 210/69  A/P:Continue current meds but try to push hydralazine to twice a day. If this doesn't work, we may try full tab chlorthalidone and stopping hydralazine. With CVA history, really want BPs under 140/90. Much closer today than previous.

## 2015-03-07 NOTE — Patient Instructions (Addendum)
Blood pressure looks MUCH better!  Home readings are even better I am really encouraged BP Readings from Last 3 Encounters:  03/07/15 148/64  01/28/15 202/96  10/11/14 210/69   Ideally we want you under 140 but also do not want to make your blood pressure to low. See if you can take Hydralazine twice a day instead of once a day. Continue avapro twice a day and chlorthalidone 1/2 tab once a day. If blood pressure gets to high- you can go back to hydralazine once a day and we may try another combination next visit  Bring your cuff with you to next visit

## 2015-04-30 ENCOUNTER — Ambulatory Visit (INDEPENDENT_AMBULATORY_CARE_PROVIDER_SITE_OTHER): Payer: Medicare Other | Admitting: Family Medicine

## 2015-04-30 ENCOUNTER — Encounter: Payer: Self-pay | Admitting: Family Medicine

## 2015-04-30 VITALS — BP 144/64 | HR 58 | Temp 97.9°F | Wt 139.0 lb

## 2015-04-30 DIAGNOSIS — Z0001 Encounter for general adult medical examination with abnormal findings: Secondary | ICD-10-CM

## 2015-04-30 DIAGNOSIS — I10 Essential (primary) hypertension: Secondary | ICD-10-CM

## 2015-04-30 NOTE — Patient Instructions (Addendum)
  [name] , Thank you for taking time to come for your Medicare Wellness Visit. I appreciate your ongoing commitment to your health goals. Please review the following plan we discussed and let me know if I can assist you in the future.   ____________________________________________________ These are the goals we discussed: 1. Continue your regular exercise which is excellent 2. Continue your blood pressure monitoring and home readings. If regularly above 145/90 please return to see [place] sooner 3. Cancel April appointment but see me in 4 months 4. Update labs at follow up ______________________________________________________   This is a list of the screening recommended for you and due dates:  Health Maintenance  Topic Date Due  . Tetanus Vaccine  01/02/1945  . Flu Shot  03/06/2016*  . Shingles Vaccine  06/03/2039*  . DEXA scan (bone density measurement)  Completed  *Topic was postponed. The date shown is not the original due date.

## 2015-04-30 NOTE — Assessment & Plan Note (Signed)
S: mild poor control on   irbesartan 150mg  BID, hydralazine 10mg  daily, chlorthalidone 12.5mg . She has burning in her legs on BP medications for some reason which improves off but BP worsens obviously. Home readings range from [id number] systolic with all diastolic controlled BP Readings from Last 3 Encounters:  04/30/15 144/64  03/07/15 148/64  01/28/15 202/96  A/P:Continue current meds:  Given some BPs as low as 102, cannot likely tolerate the BID dosing of hydralazine previously planned. This is the best balance of side effects, avoidance of hypotension, yet BP controlled we have found yet.

## 2015-04-30 NOTE — Progress Notes (Signed)
Amanda Reddish, MD Phone: [phone]  Subjective:  Patient presents today for their annual wellness visit.    Preventive Screening-Counseling & Management  Smoking Status: Never Smoker Second Hand Smoking status: No smokers in home  Risk Factors Regular exercise: 3x a week for 40 minutes.  Diet: very reasonable  Fall Risk: None   Cardiac risk factors:  advanced age (older than 23 for men, 88 for women)  Hyperlipidemia: poor control but statin intolerant No diabetes.  Personal history of CVA as well as PAD   Depression Screen None. PHQ2 0   Activities of Daily Living Independent ADLs and IADLs   Hearing Difficulties: -patient declines  Cognitive Testing No reported trouble.   Normal 3 word recall   List the Names of Other Physician/Practitioners you currently use: -Dr. Orpah Greek Obgyn -Dr. Gwenlyn Found for PAD  - Dr. Angelena Form cardiology - Dr. Carolin Guernsey optho  Required Immunizations needed today up to date except for shingles. Tetanus not covered Health Maintenance Due  Topic Date Due  . TETANUS/TDAP  01/02/1945  . ZOSTAVAX - declines 01/02/1988   Screening tests- up to date  ROS- No pertinent positives discovered in course of AWV Problem oriented No chest pain or shortness of breath. No headache or blurry vision.   The following were reviewed and entered/updated in epic: Past Medical History  Diagnosis Date  . Hypertension     a. Normal renal arteries by duplex 2013.  [name] PAF (paroxysmal atrial fibrillation) ([place])     a. s/p MAZE 2006. b. Event monitor 2013: NSR with PACs.  . Hypothyroidism   . PAD (peripheral artery disease) ([place])     a. 04/2013: s/p PCI to occluded right popliteal artery   . High cholesterol   . Migraine   . Stroke [facility])     a. Pt reports 3-4 prior strokes without residual sx. b. CVA 2013 (presented with headache) - started on Plavix. Event monitor as outpt - no AF.  [name] Arthritis   . Glaucoma of both eyes   . Sinus bradycardia    a. 03/2011 during hospital admission - beta blocker stopped.  . Chicken pox    Patient Active Problem List   Diagnosis Date Noted  . Critical lower limb ischemia/PAD 05/10/2013    Priority: High  . PAF (paroxysmal atrial fibrillation) (HCC)     Priority: High  . Stroke [facility])     Priority: High  . Hypothyroidism 06/07/2014    Priority: Medium  . Hyperlipidemia 06/07/2014    Priority: Medium  . Hypertension     Priority: Medium  . GERD (gastroesophageal reflux disease) 06/25/2014    Priority: Low  . Hyperglycemia 06/25/2014    Priority: Low  . Vitamin D deficiency 06/25/2014    Priority: Low  . Intracervical pessary 06/07/2014    Priority: Low  . Sinus bradycardia 05/19/2013    Priority: Low  . PAC (premature atrial contraction) 05/20/2011    Priority: Low   Past Surgical History  Procedure Laterality Date  . Maze  ~ 2006  . Popliteal artery stent  05/15/2013  . Lower extremity angiogram N/A 05/15/2013    Procedure: LOWER EXTREMITY ANGIOGRAM;  Surgeon: Lorretta Harp, MD;  Location: [facility] CATH LAB;  Service: Cardiovascular;  Laterality: N/A;    Family History  Problem Relation Age of Onset  . Hypertension Mother   . Ulcers Father   . Cervical cancer Sister   . Throat cancer Brother   . Lung cancer Brother   . Cancer Brother  Medications- reviewed and updated Current Outpatient Prescriptions  Medication Sig Dispense Refill  . atorvastatin (LIPITOR) 20 MG tablet Take 1 tablet (20 mg total) by mouth once a week. 30 tablet 1  . chlorthalidone (HYGROTON) 25 MG tablet Take 1 tablet (25 mg total) by mouth daily. 30 tablet 5  . clopidogrel (PLAVIX) 75 MG tablet Take 1 tablet (75 mg total) by mouth daily at 12 noon. 90 tablet 3  . hydrALAZINE (APRESOLINE) 10 MG tablet take 1 tablet by mouth three times a day 90 tablet 3  . irbesartan (AVAPRO) 150 MG tablet Take 1 tablet (150 mg total) by mouth 2 (two) times daily. 180 tablet 3  . Omega-3 Fatty Acids (OMEGA 3 PO) Take 5 mLs  by mouth daily at 3 pm. One teaspoon daily    . OVER THE COUNTER MEDICATION Nutra Burst one tablespoon with orange juice daily    . thyroid (ARMOUR) 15 MG tablet Take 1 tablet (15 mg total) by mouth daily. 90 tablet 3  . COSOPT 22.3-6.8 MG/ML ophthalmic solution Place 1 drop into both eyes 2 (two) times daily. Reported on 04/30/2015  0   No current facility-administered medications for this visit.    Allergies-reviewed and updated Allergies  Allergen Reactions  . Aceon [Perindopril Erbumine]     cough  . Amlodipine     Lower extremity discomfort  . Aspirin Cough  . Azor [Amlodipine-Olmesartan] Cough  . Beta Adrenergic Blockers     Bradycardia  . Hydralazine Nausea And Vomiting and Other (See Comments)    Weakness   . Vitamin D Analogs Cough    Social History   Social History  . Marital Status: Married    Spouse Name: N/A  . Number of Children: N/A  . Years of Education: N/A   Social History Main Topics  . Smoking status: Never Smoker   . Smokeless tobacco: Never Used  . Alcohol Use: No  . Drug Use: No  . Sexual Activity: No   Other Topics Concern  . None   Social History Narrative   Married. Lives with husband. Married 65 years in 2016. [place] 22 years in 2016. 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 6 greatgrandkids.    Finished 10th grade.       Retired from working in [employer] for [employer]             Objective: BP 144/64 mmHg  Pulse 58  Temp(Src) 97.9 F (36.6 C)  Wt 139 lb (63.05 kg)  LMP  (Approximate) Gen: NAD, resting comfortably HEENT: Mucous membranes are moist. Oropharynx normal CV: RRR no murmurs rubs or gallops Lungs: CTAB no crackles, wheeze, rhonchi Abdomen: soft/nontender/nondistended/normal bowel sounds.  Ext: no edema, did not check pulses Skin: warm, dry Neuro: grossly normal, moves all extremities, PERRLA  Assessment/Plan:  AWV completed  Hypertension S: mild poor control on   irbesartan  150mg  BID, hydralazine 10mg  daily, chlorthalidone 12.5mg . She has burning in her legs on BP medications for some reason which improves off but BP worsens obviously. Home readings range from [id number] systolic with all diastolic controlled BP Readings from Last 3 Encounters:  04/30/15 144/64  03/07/15 148/64  01/28/15 202/96  A/P:Continue current meds:  Given some BPs as low as 102, cannot likely tolerate the BID dosing of hydralazine previously planned. This is the best balance of side effects, avoidance of hypotension, yet BP controlled we have found yet.      Return in about 4 months (  around 08/30/2015). Return precautions advised.

## 2015-05-01 DIAGNOSIS — N8189 Other female genital prolapse: Secondary | ICD-10-CM | POA: Diagnosis not present

## 2015-05-03 ENCOUNTER — Encounter: Payer: Self-pay | Admitting: Cardiovascular Disease

## 2015-05-03 ENCOUNTER — Ambulatory Visit (INDEPENDENT_AMBULATORY_CARE_PROVIDER_SITE_OTHER): Payer: Medicare Other | Admitting: Cardiovascular Disease

## 2015-05-03 VITALS — BP 172/68 | HR 52 | Ht 63.0 in | Wt 140.3 lb

## 2015-05-03 DIAGNOSIS — I10 Essential (primary) hypertension: Secondary | ICD-10-CM

## 2015-05-03 DIAGNOSIS — I998 Other disorder of circulatory system: Secondary | ICD-10-CM

## 2015-05-03 DIAGNOSIS — I70229 Atherosclerosis of native arteries of extremities with rest pain, unspecified extremity: Secondary | ICD-10-CM

## 2015-05-03 DIAGNOSIS — I739 Peripheral vascular disease, unspecified: Secondary | ICD-10-CM | POA: Diagnosis not present

## 2015-05-03 DIAGNOSIS — E785 Hyperlipidemia, unspecified: Secondary | ICD-10-CM | POA: Diagnosis not present

## 2015-05-03 NOTE — Assessment & Plan Note (Signed)
History of critical limb ischemia status post intervention by myself 05/15/13 with PTA and stenting of an occluded right popliteal artery using an IDEV stent. Her Dopplers significantly improved after that with a right ABI that went from 0.48 up to 0.88. She currently denies claudication. I am going to repeat lower extremity arterial Doppler studies. She has a palpable dorsalis pedis pulse on the right.

## 2015-05-03 NOTE — Assessment & Plan Note (Signed)
History of hypertension blood pressure measured today at 172/68. She is on hydralazine and Avapro. Her last 2 blood pressures over the last week or so much better than this however.

## 2015-05-03 NOTE — Progress Notes (Signed)
05/03/2015 [name]   [birth year]  [id number]  Primary Physician Amanda Reddish, MD Primary Cardiologist: Amanda Harp MD [name]   HPI:   [name] is a delightful 82 year old married African American female mother of 4 children, grandmother of 7 grandchildren who was referred by Amanda Cowman PA-C at [name] for critical limb ischemia. I last saw her in the office 05/08/14.[name] Her past medical history is remarkable for paroxysmal atrial fibrillation and hypertension. She has had strokes in the past and is on Plavix. She developed right calf pain in early March and was referred here for further evaluation. Arterial Doppler studies revealed a right ABI of 0.48 with an occluded right popliteal artery. Based on this I angiogrammed her confirming this and performed PTA and stenting with reconstitution of the popliteal artery with an [name] stent , With resolution of her claudication symptoms and improvement in her arterial Doppler studies of the right ABI 1.1. Who is recent Dopplers performed in December revealed slight decline in her ABI 0.88 a slight increase in her popliteal velocities that she continues to deny claudication. Since I saw her a year ago she's remained clinically stable denying chest pain, shortness of breath or claudication.   Current Outpatient Prescriptions  Medication Sig Dispense Refill  . chlorthalidone (HYGROTON) 25 MG tablet Take 1 tablet (25 mg total) by mouth daily. 30 tablet 5  . clopidogrel (PLAVIX) 75 MG tablet Take 1 tablet (75 mg total) by mouth daily at 12 noon. 90 tablet 3  . COSOPT 22.3-6.8 MG/ML ophthalmic solution Place 1 drop into both eyes 2 (two) times daily. Reported on 04/30/2015  0  . hydrALAZINE (APRESOLINE) 10 MG tablet take 1 tablet by mouth three times a day 90 tablet 3  . irbesartan (AVAPRO) 150 MG tablet Take 1 tablet (150 mg total) by mouth 2 (two) times daily. 180 tablet 3  . Omega-3 Fatty Acids (OMEGA 3 PO) Take 5  mLs by mouth daily at 3 pm. One teaspoon daily    . OVER THE COUNTER MEDICATION Nutra Burst one tablespoon with orange juice daily    . thyroid (ARMOUR) 15 MG tablet Take 1 tablet (15 mg total) by mouth daily. 90 tablet 3   No current facility-administered medications for this visit.    Allergies  Allergen Reactions  . Aceon [Perindopril Erbumine]     cough  . Amlodipine     Lower extremity discomfort  . Aspirin Cough  . Azor [Amlodipine-Olmesartan] Cough  . Beta Adrenergic Blockers     Bradycardia  . Hydralazine Nausea And Vomiting and Other (See Comments)    Weakness   . Vitamin D Analogs Cough    Social History   Social History  . Marital Status: Married    Spouse Name: N/A  . Number of Children: N/A  . Years of Education: N/A   Occupational History  . Not on file.   Social History Main Topics  . Smoking status: Never Smoker   . Smokeless tobacco: Never Used  . Alcohol Use: No  . Drug Use: No  . Sexual Activity: No   Other Topics Concern  . Not on file   Social History Narrative   Married. Lives with husband. Married 65 years in 2016. [name] 22 years in 2016. 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 6 greatgrandkids.    Finished 10th grade.       Retired from working in [employer] for [name]  Review of Systems: General: negative for chills, fever, night sweats or weight changes.  Cardiovascular: negative for chest pain, dyspnea on exertion, edema, orthopnea, palpitations, paroxysmal nocturnal dyspnea or shortness of breath Dermatological: negative for rash Respiratory: negative for cough or wheezing Urologic: negative for hematuria Abdominal: negative for nausea, vomiting, diarrhea, bright red blood per rectum, melena, or hematemesis Neurologic: negative for visual changes, syncope, or dizziness All other systems reviewed and are otherwise negative except as noted above.    Blood pressure 172/68,  pulse 52, height 5\' 3"  (1.6 m), weight 140 lb 4.8 oz (63.64 kg).  General appearance: alert and no distress Neck: no adenopathy, no carotid bruit, no JVD, supple, symmetrical, trachea midline and thyroid not enlarged, symmetric, no tenderness/mass/nodules Lungs: clear to auscultation bilaterally Heart: regular rate and rhythm, S1, S2 normal, no murmur, click, rub or gallop Extremities: extremities normal, atraumatic, no cyanosis or edema and 2+ right dorsalis pedis pulse  EKG not performed today  ASSESSMENT AND PLAN:   PAF (paroxysmal atrial fibrillation) History of PAF status post remote stroke and Maze procedure 2006. She is not on oral anticoagulant and is maintaining sinus rhythm. Event monitor showed no A. Fib.  Hypertension History of hypertension blood pressure measured today at 172/68. She is on hydralazine and Avapro. Her last 2 blood pressures over the last week or so much better than this however.  Critical lower limb ischemia/PAD History of critical limb ischemia status post intervention by myself 05/15/13 with PTA and stenting of an occluded right popliteal artery using an [name] stent. Her Dopplers significantly improved after that with a right ABI that went from 0.48 up to 0.88. She currently denies claudication. I am going to repeat lower extremity arterial Doppler studies. She has a palpable dorsalis pedis pulse on the right.  Hyperlipidemia History of hyperlipidemia with lipid profile performed one year ago revealing a total cholesterol 206, LDL 131 HDL 61. She is not on statin therapy. She's been followed by her PCP.      Amanda Harp MD FACP,FACC,FAHA, [facility] 05/03/2015 11:05 AM

## 2015-05-03 NOTE — Assessment & Plan Note (Signed)
History of hyperlipidemia with lipid profile performed one year ago revealing a total cholesterol 206, LDL 131 HDL 61. She is not on statin therapy. She's been followed by her PCP.

## 2015-05-03 NOTE — Assessment & Plan Note (Signed)
History of PAF status post remote stroke and Maze procedure 2006. She is not on oral anticoagulant and is maintaining sinus rhythm. Event monitor showed no A. Fib.

## 2015-05-03 NOTE — Patient Instructions (Signed)
Medication Instructions:  Your physician recommends that you continue on your current medications as directed. Please refer to the Current Medication list given to you today.   Labwork: none  Testing/Procedures: Your physician has requested that you have a lower extremity arterial doppler- During this test, ultrasound is used to evaluate arterial blood flow in the legs. Allow approximately one hour for this exam.    Follow-Up: Your physician wants you to follow-up in: 12 months with Dr. Berry. You will receive a reminder letter in the mail two months in advance. If you don't receive a letter, please call our office to schedule the follow-up appointment.   Any Other Special Instructions Will Be Listed Below (If Applicable).     If you need a refill on your cardiac medications before your next appointment, please call your pharmacy.   

## 2015-05-09 ENCOUNTER — Other Ambulatory Visit: Payer: Self-pay | Admitting: Cardiovascular Disease

## 2015-05-09 DIAGNOSIS — I739 Peripheral vascular disease, unspecified: Secondary | ICD-10-CM

## 2015-05-16 DIAGNOSIS — L910 Hypertrophic scar: Secondary | ICD-10-CM | POA: Diagnosis not present

## 2015-05-20 ENCOUNTER — Ambulatory Visit (HOSPITAL_COMMUNITY)
Admission: RE | Admit: 2015-05-20 | Discharge: 2015-05-20 | Disposition: A | Discharge status: Home / Self Care | Payer: Medicare Other | Source: Ambulatory Visit | Attending: Cardiovascular Disease | Admitting: Cardiovascular Disease

## 2015-05-20 DIAGNOSIS — I739 Peripheral vascular disease, unspecified: Secondary | ICD-10-CM

## 2015-05-20 DIAGNOSIS — Y838 Other surgical procedures as the cause of abnormal reaction of the patient, or of later complication, without mention of misadventure at the time of the procedure: Secondary | ICD-10-CM | POA: Diagnosis not present

## 2015-05-20 DIAGNOSIS — I10 Essential (primary) hypertension: Secondary | ICD-10-CM | POA: Diagnosis not present

## 2015-05-20 DIAGNOSIS — I70201 Unspecified atherosclerosis of native arteries of extremities, right leg: Secondary | ICD-10-CM | POA: Diagnosis not present

## 2015-05-20 DIAGNOSIS — E7800 Pure hypercholesterolemia, unspecified: Secondary | ICD-10-CM | POA: Diagnosis not present

## 2015-05-20 DIAGNOSIS — T82856A Stenosis of peripheral vascular stent, initial encounter: Secondary | ICD-10-CM | POA: Insufficient documentation

## 2015-05-20 DIAGNOSIS — R938 Abnormal findings on diagnostic imaging of other specified body structures: Secondary | ICD-10-CM | POA: Insufficient documentation

## 2015-05-30 ENCOUNTER — Telehealth: Payer: Self-pay | Admitting: Cardiovascular Disease

## 2015-05-30 NOTE — Telephone Encounter (Signed)
Returning call from yesterday,concerning her test results.

## 2015-05-30 NOTE — Telephone Encounter (Signed)
Pt made aware of LEA results no questions at this time.

## 2015-06-05 ENCOUNTER — Ambulatory Visit: Payer: Medicare Other | Admitting: Family Medicine

## 2015-06-10 ENCOUNTER — Encounter: Payer: Self-pay | Admitting: Cardiovascular Disease

## 2015-06-10 ENCOUNTER — Ambulatory Visit (INDEPENDENT_AMBULATORY_CARE_PROVIDER_SITE_OTHER): Payer: Medicare Other | Admitting: Cardiovascular Disease

## 2015-06-10 VITALS — BP 153/58 | HR 52 | Ht 63.0 in | Wt 137.0 lb

## 2015-06-10 DIAGNOSIS — R001 Bradycardia, unspecified: Secondary | ICD-10-CM | POA: Diagnosis not present

## 2015-06-10 DIAGNOSIS — I480 Paroxysmal atrial fibrillation: Secondary | ICD-10-CM | POA: Diagnosis not present

## 2015-06-10 DIAGNOSIS — I10 Essential (primary) hypertension: Secondary | ICD-10-CM | POA: Diagnosis not present

## 2015-06-10 DIAGNOSIS — I739 Peripheral vascular disease, unspecified: Secondary | ICD-10-CM | POA: Diagnosis not present

## 2015-06-10 NOTE — Patient Instructions (Signed)
Medication Instructions:  Your physician recommends that you continue on your current medications as directed. Please refer to the Current Medication list given to you today.   Labwork: None  Testing/Procedures: none  Follow-Up: Your physician wants you to follow-up in: 12 months.  You will receive a reminder letter in the mail two months in advance. If you don't receive a letter, please call our office to schedule the follow-up appointment.   Any Other Special Instructions Will Be Listed Below (If Applicable).     If you need a refill on your cardiac medications before your next appointment, please call your pharmacy.

## 2015-06-10 NOTE — Progress Notes (Signed)
Chief Complaint  Patient presents with  . Follow-up  . Hypertension     History of Present Illness: 82 yo AAF with history of HTN, CVA, PAF s/p MAZE 2006, PAD who is here today for cardiac follow up. She was admitted to [facility] on 04/01/11 with a headache, elevated BP and sinus bradycardia. She saw her family physician about 10 day prior to admission and was started on Bystolic for better blood pressure control. She had been compliant with this medication but began to have headaches and came into the ED. Head CT was non-specific. I saw her as a Optometrist for bradycardia and we held her beta blocker. Brain MRI on 04/02/11 showed acute 1 cm right occipital cortical infarct without hemorrhage. No mass lesion or hydrocephalus. Atrophy and small vessel disease. Remote bihemispheric infarcts. She was seen by Neurology and given her history of remote infarcts and history of PAF in the past before admission, recommendation was made an event monitor to exclude recurrence of atrial fibrillation. She was started on Plavix in the hospital. Her beta blocker was held and an ARB was started. She was started on Norvasc in the hospital but had burning in her legs so she stopped it. Echo 04/02/11 with normal LV size and function. I saw her here in the office March 2013 and ordered an event monitor to exclude PAF. Event monitor showed NSR with PACs.No evidence of atrial fib. Renal artery dopplers November 2013 without evidence of renal artery stenosis. I started HCTZ in October 2013 but she did not want to try this medication. She was was admitted 3/23-3/24/15 for planned lower extremity angiogram due to significant RLE claudication. She underwent successful balloon/stenting of an occluded right popliteal artery per Dr. Gwenlyn Found. She was continued on Plavix as she is intolerant to aspirin. She had issues with significant hypertension during that admission and was treated with IV NTG/hydralazine to bring her BP down.  Coreg was prescribed, however, the patient was reluctant to take this due to her history of sinus bradycardia and baseline HR typically around 54. She was seen by Melina Copa, PA-C on 05/19/13 in the office for elevated BP. She reported compliance with Benicar. She was started on Hydralazine 50 mg po TID but she stopped this due to dizziness. I started clonidine 0.1mg  po BID but she had more bradycardia so Azor was added in primary care but this caused LE edema. Echo June 2015 at [facility] with normal LV function. She has most recently been on Avapro 150 mg po BID, Hydralazine 10 mg TID and Chlorthalidone 25 mg daily.     She is here today for follow up. No chest pain or SOB. No palpitations. Feeling well overall but cough recently with the pollen outside. BP has been controlled at home with highest readings [id number] systolic. She does have constipation.   Primary Care Physician:  Garret Reddish, MD  Past Medical History  Diagnosis Date  . Hypertension     a. Normal renal arteries by duplex 2013.  [name] PAF (paroxysmal atrial fibrillation) ([place])     a. s/p MAZE 2006. b. Event monitor 2013: NSR with PACs.  . Hypothyroidism   . PAD (peripheral artery disease) ([place])     a. 04/2013: s/p PCI to occluded right popliteal artery   . High cholesterol   . Migraine   . Stroke [facility])     a. Pt reports 3-4 prior strokes without residual sx. b. CVA 2013 (presented with headache) - started on Plavix.  Event monitor as outpt - no AF.  [name] Arthritis   . Glaucoma of both eyes   . Sinus bradycardia     a. 03/2011 during hospital admission - beta blocker stopped.  . Chicken pox     Past Surgical History  Procedure Laterality Date  . Maze  ~ 2006  . Popliteal artery stent  05/15/2013  . Lower extremity angiogram N/A 05/15/2013    Procedure: LOWER EXTREMITY ANGIOGRAM;  Surgeon: Lorretta Harp, MD;  Location: [facility] CATH LAB;  Service: Cardiovascular;  Laterality: N/A;    Current Outpatient Prescriptions  Medication Sig  Dispense Refill  . chlorthalidone (HYGROTON) 25 MG tablet Take 1 tablet (25 mg total) by mouth daily. 30 tablet 5  . clopidogrel (PLAVIX) 75 MG tablet Take 1 tablet (75 mg total) by mouth daily at 12 noon. 90 tablet 3  . COSOPT 22.3-6.8 MG/ML ophthalmic solution Place 1 drop into both eyes 2 (two) times daily. Reported on 04/30/2015  0  . hydrALAZINE (APRESOLINE) 10 MG tablet take 1 tablet by mouth three times a day 90 tablet 3  . irbesartan (AVAPRO) 150 MG tablet Take 1 tablet (150 mg total) by mouth 2 (two) times daily. 180 tablet 3  . Omega-3 Fatty Acids (OMEGA 3 PO) Take 5 mLs by mouth daily at 3 pm. One teaspoon daily    . OVER THE COUNTER MEDICATION Nutra Burst one tablespoon with orange juice daily    . thyroid (ARMOUR) 15 MG tablet Take 1 tablet (15 mg total) by mouth daily. 90 tablet 3   No current facility-administered medications for this visit.    Allergies  Allergen Reactions  . Aceon [Perindopril Erbumine]     cough  . Amlodipine     Lower extremity discomfort  . Aspirin Cough  . Azor [Amlodipine-Olmesartan] Cough  . Beta Adrenergic Blockers     Bradycardia  . Hydralazine Nausea And Vomiting and Other (See Comments)    Weakness   . Vitamin D Analogs Cough    Social History   Social History  . Marital Status: Married    Spouse Name: N/A  . Number of Children: N/A  . Years of Education: N/A   Occupational History  . Not on file.   Social History Main Topics  . Smoking status: Never Smoker   . Smokeless tobacco: Never Used  . Alcohol Use: No  . Drug Use: No  . Sexual Activity: No   Other Topics Concern  . Not on file   Social History Narrative   Married. Lives with husband. Married 65 years in 2016. [place] 22 years in 2016. 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 6 greatgrandkids.    Finished 10th grade.       Retired from working in [employer] for [employer]             Family History  Problem Relation Age of  Onset  . Hypertension Mother   . Ulcers Father   . Cervical cancer Sister   . Throat cancer Brother   . Lung cancer Brother   . Cancer Brother     Review of Systems:  As stated in the HPI and otherwise negative.   BP 153/58 mmHg  Pulse 52  Ht 5\' 3"  (1.6 m)  Wt 137 lb (62.143 kg)  BMI 24.27 kg/m2  LMP  (Approximate)  Physical Examination: General: Well developed, well nourished, NAD HEENT: OP clear, mucus membranes moist SKIN: warm, dry. No rashes. Neuro:  No focal deficits Musculoskeletal: Muscle strength 5/5 all ext Psychiatric: Mood and affect normal Neck: No JVD, no carotid bruits, no thyromegaly, no lymphadenopathy. Lungs:Clear bilaterally, no wheezes, rhonci, crackles Cardiovascular: Regular rate and rhythm. No murmurs, gallops or rubs. Abdomen:Soft. Bowel sounds present. Non-tender.  Extremities: No lower extremity edema. Pulses are 2 + in the bilateral DP/PT.  Echo 08/02/13: Normal LV function, LVEF=55-60%. Trace TR  EKG:  EKG is ordered today. The ekg ordered today demonstrates Sinus brady, rate 52 bpm. RBBB. T wave abnormality. Unchanged.   Recent Labs: 06/22/2014: ALT 14; TSH 3.09 10/11/2014: BUN 10; Creatinine, Ser 1.05*; Hemoglobin 11.7*; Platelets 172; Potassium 3.9; Sodium 141   Lipid Panel    Component Value Date/Time   CHOL 205* 06/22/2014 0940   TRIG 59.0 06/22/2014 0940   HDL 61.80 06/22/2014 0940   CHOLHDL 3 06/22/2014 0940   VLDL 11.8 06/22/2014 0940   LDLCALC 131* 06/22/2014 0940     Wt Readings from Last 3 Encounters:  06/10/15 137 lb (62.143 kg)  05/03/15 140 lb 4.8 oz (63.64 kg)  04/30/15 139 lb (63.05 kg)     Other studies Reviewed: Additional studies/ records that were reviewed today include: . Review of the above records demonstrates:    Assessment and Plan:   1. PAC (Premature atrial contraction): No recent palpitations.   2. HTN: BP has been controlled at home. Slightly elevated today but good for her. Will continue current  therapy with Avapro, Hydralazine and Chlorthalidone. Continue to follow BP at home.   3. PAD: Stable s/p PTA/stent right popliteal artery by Dr. Gwenlyn Found March 2015. Recent f/u with Dr. Gwenlyn Found March 2017 and she is felt to be stable.   4. Sinus bradycardia: No dizziness. She is on no AV nodal blocking agents.   Current medicines are reviewed at length with the patient today.  The patient does not have concerns regarding medicines.  The following changes have been made:  no change  Labs/ tests ordered today include:   Orders Placed This Encounter  Procedures  . EKG 12-Lead    Disposition:   FU with me in 12  months  Signed, Lauree Chandler, MD 06/10/2015 11:07 AM    [facility] [address], [place], [place]  [address] Phone: [phone]; Fax: [phone]

## 2015-06-17 ENCOUNTER — Telehealth: Payer: Self-pay | Admitting: Family Medicine

## 2015-06-17 ENCOUNTER — Other Ambulatory Visit: Payer: Self-pay | Admitting: *Deleted

## 2015-06-17 DIAGNOSIS — I739 Peripheral vascular disease, unspecified: Secondary | ICD-10-CM

## 2015-06-17 MED ORDER — IRBESARTAN 150 MG PO TABS: 150.0000 mg | ORAL_TABLET | Freq: Two times a day (BID) | ORAL | Status: DC

## 2015-06-17 MED ORDER — CLOPIDOGREL BISULFATE 75 MG PO TABS: 75.0000 mg | ORAL_TABLET | Freq: Every day | ORAL | Status: DC

## 2015-06-17 NOTE — Telephone Encounter (Signed)
Medication refilled

## 2015-06-17 NOTE — Telephone Encounter (Signed)
Pt request refill of the following: irbesartan (AVAPRO) 150 MG tablet   Phamacy:  [facility]

## 2015-06-18 ENCOUNTER — Encounter: Payer: Self-pay | Admitting: Cardiovascular Disease

## 2015-06-18 ENCOUNTER — Ambulatory Visit (INDEPENDENT_AMBULATORY_CARE_PROVIDER_SITE_OTHER): Payer: Medicare Other | Admitting: Cardiovascular Disease

## 2015-06-18 VITALS — BP 182/64 | HR 50 | Ht 63.0 in | Wt 137.0 lb

## 2015-06-18 DIAGNOSIS — I998 Other disorder of circulatory system: Secondary | ICD-10-CM

## 2015-06-18 DIAGNOSIS — I70229 Atherosclerosis of native arteries of extremities with rest pain, unspecified extremity: Secondary | ICD-10-CM

## 2015-06-18 NOTE — Assessment & Plan Note (Signed)
[name] returns today for her one-year follow-up. She was initially referred to me by [name] [facility] at [facility] clinic for critical limb ischemia..her initial right ABI was0.48. I studied her March 2015 and opened up an occluded right popliteal artery with an IDEV stent resulting in marked improvement in her symptoms and Doppler studies.her most recent Dopplers performed 05/20/15 revealed a right ABI 0.88 with a high frequency signal in her right popliteal artery of 484 cm/s. This is increased from 360 cm/s a year ago. She gets occasional atypical cramping but clearly denies claudication. We'll continue to follow her Doppler studies are annual basis.

## 2015-06-18 NOTE — Progress Notes (Signed)
06/18/2015 [name]   1925/03/26  [id number]  Primary Physician Garret Reddish, MD Primary Cardiologist: Lorretta Harp MD Renae Gloss   HPI:  [name] is a delightful 82 year old married African American female mother of 4 children, grandmother of 7 grandchildren who was referred by Isaias Cowman PA-C at [facility] for critical limb ischemia. I last saw her in the office 05/03/15... Her past medical history is remarkable for paroxysmal atrial fibrillation and hypertension. She has had strokes in the past and is on Plavix. She developed right calf pain in early March and was referred here for further evaluation. Arterial Doppler studies revealed a right ABI of 0.48 with an occluded right popliteal artery. Based on this I angiogrammed her 05/15/13 confirming this and performed PTA and stenting with reconstitution of the popliteal artery with an IDEV stent , With resolution of her claudication symptoms and improvement in her arterial Doppler studies of the right ABI 1.1. Her most recent Dopplers performed 05/20/15 revealed a decline in her ABI to 0.88 on the right increase in her popliteal velocity from 360 up to 484 cm/s. She continues to deny claudication.  Current Outpatient Prescriptions  Medication Sig Dispense Refill  . chlorthalidone (HYGROTON) 25 MG tablet Take 1 tablet (25 mg total) by mouth daily. 30 tablet 5  . clopidogrel (PLAVIX) 75 MG tablet Take 1 tablet (75 mg total) by mouth daily at 12 noon. 90 tablet 3  . COSOPT 22.3-6.8 MG/ML ophthalmic solution Place 1 drop into both eyes 2 (two) times daily. Reported on 04/30/2015  0  . hydrALAZINE (APRESOLINE) 10 MG tablet take 1 tablet by mouth three times a day 90 tablet 3  . irbesartan (AVAPRO) 150 MG tablet Take 1 tablet (150 mg total) by mouth 2 (two) times daily. 180 tablet 3  . Omega-3 Fatty Acids (OMEGA 3 PO) Take 5 mLs by mouth daily at 3 pm. One teaspoon daily    . OVER THE COUNTER MEDICATION Nutra Burst  one tablespoon with orange juice daily    . thyroid (ARMOUR) 15 MG tablet Take 1 tablet (15 mg total) by mouth daily. 90 tablet 3   No current facility-administered medications for this visit.    Allergies  Allergen Reactions  . Aceon [Perindopril Erbumine]     cough  . Amlodipine     Lower extremity discomfort  . Aspirin Cough  . Azor [Amlodipine-Olmesartan] Cough  . Beta Adrenergic Blockers     Bradycardia  . Hydralazine Nausea And Vomiting and Other (See Comments)    Weakness   . Vitamin D Analogs Cough    Social History   Social History  . Marital Status: Married    Spouse Name: N/A  . Number of Children: N/A  . Years of Education: N/A   Occupational History  . Not on file.   Social History Main Topics  . Smoking status: Never Smoker   . Smokeless tobacco: Never Used  . Alcohol Use: No  . Drug Use: No  . Sexual Activity: No   Other Topics Concern  . Not on file   Social History Narrative   Married. Lives with husband. Married 65 years in 2016. [place] 22 years in 2016. 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 6 greatgrandkids.    Finished 10th grade.       Retired from working in [employer] for [employer]              Review of  Systems: General: negative for chills, fever, night sweats or weight changes.  Cardiovascular: negative for chest pain, dyspnea on exertion, edema, orthopnea, palpitations, paroxysmal nocturnal dyspnea or shortness of breath Dermatological: negative for rash Respiratory: negative for cough or wheezing Urologic: negative for hematuria Abdominal: negative for nausea, vomiting, diarrhea, bright red blood per rectum, melena, or hematemesis Neurologic: negative for visual changes, syncope, or dizziness All other systems reviewed and are otherwise negative except as noted above.    Blood pressure 182/64, pulse 50, height 5\' 3"  (1.6 m), weight 137 lb (62.143 kg).  General appearance: alert and no  distress Neck: no adenopathy, no carotid bruit, no JVD, supple, symmetrical, trachea midline and thyroid not enlarged, symmetric, no tenderness/mass/nodules Lungs: clear to auscultation bilaterally Heart: regular rate and rhythm, S1, S2 normal, no murmur, click, rub or gallop Extremities: extremities normal, atraumatic, no cyanosis or edema  EKG not performed today  ASSESSMENT AND PLAN:   Critical lower limb ischemia/PAD [name] returns today for her one-year follow-up. She was initially referred to me by [name] [facility] at [facility] clinic for critical limb ischemia..her initial right ABI was0.48. I studied her March 2015 and opened up an occluded right popliteal artery with an IDEV stent resulting in marked improvement in her symptoms and Doppler studies.her most recent Dopplers performed 05/20/15 revealed a right ABI 0.88 with a high frequency signal in her right popliteal artery of 484 cm/s. This is increased from 360 cm/s a year ago. She gets occasional atypical cramping but clearly denies claudication. We'll continue to follow her Doppler studies are annual basis.      Lorretta Harp MD FACP,FACC,FAHA, [facility] 06/18/2015 3:01 PM

## 2015-06-18 NOTE — Patient Instructions (Addendum)
Medication Instructions:  Your physician recommends that you continue on your current medications as directed. Please refer to the Current Medication list given to you today.  Labwork: none  Testing/Procedures: Your physician has requested that you have a lower extremity arterial doppler- During this test, ultrasound is used to evaluate arterial blood flow in the legs. Allow approximately one hour for this exam. IN MARCH 2018.   Follow-Up: Your physician wants you to follow-up in: 1 year with Dr Gwenlyn Found. You will receive a reminder letter in the mail two months in advance. If you don't receive a letter, please call our office to schedule the follow-up appointment.   Any Other Special Instructions Will Be Listed Below (If Applicable).     If you need a refill on your cardiac medications before your next appointment, please call your pharmacy.

## 2015-06-21 ENCOUNTER — Ambulatory Visit (INDEPENDENT_AMBULATORY_CARE_PROVIDER_SITE_OTHER): Payer: Medicare Other | Admitting: Family Medicine

## 2015-06-21 ENCOUNTER — Telehealth: Payer: Self-pay | Admitting: Family Medicine

## 2015-06-21 ENCOUNTER — Encounter: Payer: Self-pay | Admitting: Family Medicine

## 2015-06-21 VITALS — BP 130/64 | HR 78 | Temp 98.3°F | Wt 139.0 lb

## 2015-06-21 DIAGNOSIS — R05 Cough: Secondary | ICD-10-CM | POA: Diagnosis not present

## 2015-06-21 DIAGNOSIS — R067 Sneezing: Secondary | ICD-10-CM

## 2015-06-21 DIAGNOSIS — K5900 Constipation, unspecified: Secondary | ICD-10-CM | POA: Diagnosis not present

## 2015-06-21 DIAGNOSIS — R059 Cough, unspecified: Secondary | ICD-10-CM

## 2015-06-21 MED ORDER — FLUTICASONE PROPIONATE 50 MCG/ACT NA SUSP: 2.0000 | Freq: Every day | NASAL | Status: DC

## 2015-06-21 NOTE — Telephone Encounter (Signed)
[facility] - Client [place] [facility]  Patient Name: [name]  DOB: 02-17-1926    Initial Comment Caller states her blood pressure is high. She is unable to have a bowel movement. She has tried to take a laxative and it hasnt worked.    Nurse Assessment  Nurse: Wayne Sever, RN, Tillie Rung Date/Time (Eastern Time): 06/21/2015 1:29:05 PM  Confirm and document reason for call. If symptomatic, describe symptoms. You must click the next button to save text entered. ---Caller states she was told to call if her BP is going up and she cannot pass her bowels and she took a laxative and still cannot go. She has not taken her BP today, she states it has been running 159/66. She states she has not went in a few days, but is able to strain and can get a little out at a time.  Has the patient traveled out of the country within the last 30 days? ---Not Applicable  Does the patient have any new or worsening symptoms? ---Yes  Will a triage be completed? ---Yes  Related visit to physician within the last 2 weeks? ---No  Does the PT have any chronic conditions? (i.e. diabetes, asthma, etc.) ---Yes  List chronic conditions. ---HTN, Atril Fibrillation  Is this a behavioral health or substance abuse call? ---No     Guidelines    Guideline Title Affirmed Question Affirmed Notes  Constipation Abdomen is more swollen than usual    Final Disposition User   See Physician within 24 Hours Ambler, RN, Tillie Rung    Comments  Scheduled with Dr. Yong Channel today at 415p   Referrals  REFERRED TO PCP OFFICE   Disagree/Comply: Comply

## 2015-06-21 NOTE — Patient Instructions (Signed)
Delsym should not cause constipation Tussionex definitely does cause constipation. Tessalon does too so I hesitate to use it  Let's try flonase 2 sprays each nostril for 2 weeks then follow up with me. I think you will be feeling better  For constipation, buy som miralax over the counter Let's have you use 1/2 a capful for the next 5 days, hold for loose bowel movements. If you do not have regular, less firm bowel movements by day 3, can take full capful

## 2015-06-21 NOTE — Telephone Encounter (Signed)
FYI

## 2015-06-21 NOTE — Progress Notes (Signed)
Subjective:  [name] is a 82 y.o. year old very pleasant female patient who presents for/with See problem oriented charting ROS- see ros below  Past Medical History-  Patient Active Problem List   Diagnosis Date Noted  . Critical lower limb ischemia/PAD 05/10/2013    Priority: High  . PAF (paroxysmal atrial fibrillation) (HCC)     Priority: High  . Stroke [facility])     Priority: High  . Hypothyroidism 06/07/2014    Priority: Medium  . Hyperlipidemia 06/07/2014    Priority: Medium  . Hypertension     Priority: Medium  . GERD (gastroesophageal reflux disease) 06/25/2014    Priority: Low  . Hyperglycemia 06/25/2014    Priority: Low  . Vitamin D deficiency 06/25/2014    Priority: Low  . Intracervical pessary 06/07/2014    Priority: Low  . Sinus bradycardia 05/19/2013    Priority: Low  . PAC (premature atrial contraction) 05/20/2011    Priority: Low    Medications- reviewed and updated Current Outpatient Prescriptions  Medication Sig Dispense Refill  . chlorthalidone (HYGROTON) 25 MG tablet Take 1 tablet (25 mg total) by mouth daily. 30 tablet 5  . clopidogrel (PLAVIX) 75 MG tablet Take 1 tablet (75 mg total) by mouth daily at 12 noon. 90 tablet 3  . COSOPT 22.3-6.8 MG/ML ophthalmic solution Place 1 drop into both eyes 2 (two) times daily. Reported on 06/21/2015  0  . hydrALAZINE (APRESOLINE) 10 MG tablet take 1 tablet by mouth three times a day 90 tablet 3  . irbesartan (AVAPRO) 150 MG tablet Take 1 tablet (150 mg total) by mouth 2 (two) times daily. 180 tablet 3  . Omega-3 Fatty Acids (OMEGA 3 PO) Take 5 mLs by mouth daily at 3 pm. One teaspoon daily    . OVER THE COUNTER MEDICATION Nutra Burst one tablespoon with orange juice daily    . thyroid (ARMOUR) 15 MG tablet Take 1 tablet (15 mg total) by mouth daily. 90 tablet 3   No current facility-administered medications for this visit.    Objective: BP 130/64 mmHg  Pulse 78  Temp(Src) 98.3 F (36.8 C)  Wt 139 lb  (63.05 kg)  LMP  (Approximate) Gen: NAD, resting comfortably Mild clear rhinorrhea, oropharynx normal except some drainage, TM normal Slightly dry mucus membranes CV: RRR no murmurs rubs or gallops Lungs: CTAB no crackles, wheeze, rhonchi Abdomen: soft/nontender/nondistended/normal bowel sounds. No rebound or guarding.  Ext: no edema Skin: warm, dry Neuro: grossly normal, moves all extremities  Assessment/Plan:  Constipation Cough/sneezing S:had cough about 2 weeks ago and seemed to throw her back out. Continues to have cough and gets in sneezing fits and also has some runny nose. 53 some old tussionex she had. Then was having trouble having bathroom on her own except for small pebbles though think sshe had this issue before the tussionex as well,./. Took a laxative for a day or two smooth move herbal laxative- and had bowel movements for 2 days about a week ago. Then didn't take it and started getting constipated again and passing "hard marbles", took a small amount 2 days ago and then last night. Today starting to have regular bowel movements again. Called [facility] and took a fleets enema after that but barely anything came out as she had already had a good BM this morning.  ROS- no fever or shortness of breath, no calf swelling, no chills A/P: Suspect patient likely has some allergies as well as potential URI exacerbating issue. Advised  flonase. Avoid tussionex, considered tessalon but also causes constipation. Can schedule delsym as constipation not listed on uptodate side effect list. I think tussionex may have caused constipation or at least heavily contributed- she seems to really want to take it as it helps her. We will schedule miralax half capful for 5 days- hopeful she will be improved in that timeframe and can stop. Also see AVS. Going out of town on 10th of may and discussed happy to see her before then if needed. Also push fluid and try to remain active in regards to constipation.  Blood pressure was up while constipated but now that has had BM- has impvoed to normal.   Return precautions advised.    Meds ordered this encounter  Medications  . fluticasone (FLONASE) 50 MCG/ACT nasal spray    Sig: Place 2 sprays into both nostrils daily.    Dispense:  16 g    Refill:  6    Garret Reddish, MD

## 2015-07-29 ENCOUNTER — Ambulatory Visit (INDEPENDENT_AMBULATORY_CARE_PROVIDER_SITE_OTHER)
Admission: RE | Admit: 2015-07-29 | Discharge: 2015-07-29 | Disposition: A | Discharge status: Home / Self Care | Payer: Medicare Other | Source: Ambulatory Visit | Attending: Family Medicine | Admitting: Family Medicine

## 2015-07-29 ENCOUNTER — Ambulatory Visit (INDEPENDENT_AMBULATORY_CARE_PROVIDER_SITE_OTHER): Payer: Medicare Other | Admitting: Family Medicine

## 2015-07-29 ENCOUNTER — Encounter: Payer: Self-pay | Admitting: Family Medicine

## 2015-07-29 VITALS — BP 150/66 | HR 50 | Temp 98.1°F | Resp 18 | Ht 63.0 in | Wt 137.0 lb

## 2015-07-29 DIAGNOSIS — R053 Chronic cough: Secondary | ICD-10-CM

## 2015-07-29 DIAGNOSIS — K5900 Constipation, unspecified: Secondary | ICD-10-CM | POA: Diagnosis not present

## 2015-07-29 DIAGNOSIS — R05 Cough: Secondary | ICD-10-CM | POA: Diagnosis not present

## 2015-07-29 DIAGNOSIS — I10 Essential (primary) hypertension: Secondary | ICD-10-CM

## 2015-07-29 MED ORDER — AVAPRO 150 MG PO TABS: 150.0000 mg | ORAL_TABLET | Freq: Two times a day (BID) | ORAL | Status: DC

## 2015-07-29 NOTE — Progress Notes (Signed)
Subjective:  [name] is a 82 y.o. year old very pleasant female patient who presents for/with See problem oriented charting ROS- see any ROS included in HPI as well.   Past Medical History-  Patient Active Problem List   Diagnosis Date Noted  . Critical lower limb ischemia/PAD 05/10/2013    Priority: High  . PAF (paroxysmal atrial fibrillation) (HCC)     Priority: High  . Stroke [facility])     Priority: High  . Hypothyroidism 06/07/2014    Priority: Medium  . Hyperlipidemia 06/07/2014    Priority: Medium  . Hypertension     Priority: Medium  . GERD (gastroesophageal reflux disease) 06/25/2014    Priority: Low  . Hyperglycemia 06/25/2014    Priority: Low  . Vitamin D deficiency 06/25/2014    Priority: Low  . Intracervical pessary 06/07/2014    Priority: Low  . Sinus bradycardia 05/19/2013    Priority: Low  . PAC (premature atrial contraction) 05/20/2011    Priority: Low    Medications- reviewed and updated Current Outpatient Prescriptions  Medication Sig Dispense Refill  . AVAPRO 150 MG tablet Take 1 tablet (150 mg total) by mouth 2 (two) times daily. 180 tablet 3  . chlorthalidone (HYGROTON) 25 MG tablet Take 1 tablet (25 mg total) by mouth daily. 30 tablet 5  . clopidogrel (PLAVIX) 75 MG tablet Take 1 tablet (75 mg total) by mouth daily at 12 noon. 90 tablet 3  . COSOPT 22.3-6.8 MG/ML ophthalmic solution Place 1 drop into both eyes 2 (two) times daily. Reported on 06/21/2015  0  . fluticasone (FLONASE) 50 MCG/ACT nasal spray Place 2 sprays into both nostrils daily. 16 g 6  . hydrALAZINE (APRESOLINE) 10 MG tablet take 1 tablet by mouth three times a day 90 tablet 3  . LUMIGAN 0.01 % SOLN   0  . Omega-3 Fatty Acids (OMEGA 3 PO) Take 5 mLs by mouth daily at 3 pm. One teaspoon daily    . OVER THE COUNTER MEDICATION Nutra Burst one tablespoon with orange juice daily    . thyroid (ARMOUR) 15 MG tablet Take 1 tablet (15 mg total) by mouth daily. 90 tablet 3   No current  facility-administered medications for this visit.    Objective: BP 150/66 mmHg  Pulse 50  Temp(Src) 98.1 F (36.7 C) (Oral)  Resp 18  Ht 5\' 3"  (1.6 m)  Wt 137 lb (62.143 kg)  BMI 24.27 kg/m2  SpO2 98%  LMP  (Approximate) Gen: NAD, resting comfortably CV: RRR no murmurs rubs or gallops Lungs: CTAB no crackles, wheeze, rhonchi Abdomen: soft/nontender/slight distension/normal bowel sounds. No rebound or guarding.  Ext: no edema Skin: warm, dry, no rash Neuro: grossly normal, moves all extremities  Assessment/Plan:  Chronic cough- poor control Constipation- poor control Hypertension- slight poor control and worried about cough on medicine S: Difficulty having bowel movement unless she takes something. Took metamucil and helped some but seemed to wear off. Started taking "smooth moves" again. Sits down but cannot go. Some abdominal distension. Mild stomach upset with delysm and doesn't stop the cough. Didn't improve on flonase despite taking for 6 weeks. 8 weeks of cough now. Occasional headaches when she coughs. Gags sometimes when she coughs and trying to get phlegm. Phlegm is clear and shiny.  ROS- no fever or shortness of breath. Some more fatigue than normal. Poor sleep due to coughing all night. No PND or weight gain. Bed elevates and she keeps it slightly elevated- difficult to tell  on orthopnea A/P: - Plan: DG Chest 2 View, Ambulatory referral to Allergy. We discussed potential allergic cause given usually periods of coughing, sneezing about once an hour followed by watery nose. Failed flonase- hesitant to use antihistamine with constipation issues. Doubt asthma. Not on ace- i. Wants to trial name brand avapro so will send in. See avs for constipation advice.   Of note patient has had numerous side effects with medications specifically blood pressure as listed below and almost always worried that cough could be caused by meds she is on- listed below  HCTZ 12.5mg -burning with  urination Amlodipine-burning in shins and edema Beta blocker-cannot tolerate due to brady Hydralazine 50 TID- dizziness Clonidine 0.1 mg- bradycardia Chlorthalidone-cough  Return precautions advised.   Orders Placed This Encounter  Procedures  . DG Chest 2 View    Standing Status: Future     Number of Occurrences:      Standing Expiration Date: 09/27/2016    Order Specific Question:  Reason for Exam (SYMPTOM  OR DIAGNOSIS REQUIRED)    Answer:  chronic cough 8 weeks    Order Specific Question:  Preferred imaging location?    Answer:  [facility]  . Ambulatory referral to Allergy    Referral Priority:  Routine    Referral Type:  Allergy Testing    Referral Reason:  Specialty Services Required    Requested Specialty:  Allergy    Number of Visits Requested:  1    Meds ordered this encounter  Medications  . LUMIGAN 0.01 % SOLN    Sig:     Refill:  0  . AVAPRO 150 MG tablet    Sig: Take 1 tablet (150 mg total) by mouth 2 (two) times daily.    Dispense:  180 tablet    Refill:  3    Garret Reddish, MD

## 2015-07-29 NOTE — Patient Instructions (Addendum)
Trial name brand irbesartan called AVAPRO- though may not be covered by insurance  Get Chest x-ray today  We will call you within a week about your referral to allergist. If you do not hear within 2 weeks, give [place] a call.   For constipation- continue metamucil and once a day would add miralax (can susbstitute your smooth moves if you would like) for a week. If worsening issues or not helping- see me back and we will do rectal exam to see if any stool burden  Return if fever, shortness of breath, new or worsening symptoms or symptoms persist a month from now

## 2015-08-08 ENCOUNTER — Other Ambulatory Visit: Payer: Self-pay

## 2015-08-08 ENCOUNTER — Telehealth: Payer: Self-pay | Admitting: Family Medicine

## 2015-08-08 MED ORDER — THYROID 15 MG PO TABS: 15.0000 mg | ORAL_TABLET | Freq: Every day | ORAL | Status: DC

## 2015-08-08 NOTE — Telephone Encounter (Signed)
Pt following up on refill request for thyroid (ARMOUR) 15 MG tablet [facility]  Also, pt saw Dr Yong Channel 6/05.  Pt state she is not any better, coughs all the time. The OTC cough med dr recommends is to working. Pt feels like the coughing and gagging is wearing her down ,going on 2 months.  Pt could not tolerate the miralax ,so her bowels do not move unless she takes a laxative.  Pt has appointment Mon 19 with the allergist.  Wants to know if Dr Yong Channel can do anything for her, should she come in?

## 2015-08-08 NOTE — Telephone Encounter (Signed)
Left voicemail message. Patient has an appointment on the 19th. We will see her then unless she is worsening in which I instructed her to return my call.

## 2015-08-08 NOTE — Telephone Encounter (Signed)
If going on for 2 months and not worsening and has appointment on the 19th- I think it is reasonable to wait until that time to be seen.

## 2015-08-08 NOTE — Telephone Encounter (Signed)
Refill done for Armour as requested. JH,LPN  Please see message regarding cough & Miralax

## 2015-08-12 ENCOUNTER — Ambulatory Visit (INDEPENDENT_AMBULATORY_CARE_PROVIDER_SITE_OTHER): Payer: Medicare Other | Admitting: Allergy and Immunology

## 2015-08-12 ENCOUNTER — Encounter: Payer: Self-pay | Admitting: Allergy and Immunology

## 2015-08-12 VITALS — BP 140/60 | HR 50 | Temp 97.8°F | Resp 16 | Ht 63.58 in | Wt 140.8 lb

## 2015-08-12 DIAGNOSIS — R05 Cough: Secondary | ICD-10-CM | POA: Diagnosis not present

## 2015-08-12 DIAGNOSIS — R0600 Dyspnea, unspecified: Secondary | ICD-10-CM | POA: Diagnosis not present

## 2015-08-12 DIAGNOSIS — K219 Gastro-esophageal reflux disease without esophagitis: Secondary | ICD-10-CM

## 2015-08-12 DIAGNOSIS — R053 Chronic cough: Secondary | ICD-10-CM | POA: Insufficient documentation

## 2015-08-12 DIAGNOSIS — J3089 Other allergic rhinitis: Secondary | ICD-10-CM | POA: Diagnosis not present

## 2015-08-12 DIAGNOSIS — R059 Cough, unspecified: Secondary | ICD-10-CM

## 2015-08-12 MED ORDER — MONTELUKAST SODIUM 10 MG PO TABS: 10.0000 mg | ORAL_TABLET | Freq: Every day | ORAL | Status: DC

## 2015-08-12 MED ORDER — RANITIDINE HCL 150 MG/10ML PO SYRP: 150.0000 mg | ORAL_SOLUTION | Freq: Two times a day (BID) | ORAL | Status: DC

## 2015-08-12 MED ORDER — AZELASTINE HCL 0.15 % NA SOLN: NASAL | Status: DC

## 2015-08-12 NOTE — Assessment & Plan Note (Signed)
   Aeroallergen avoidance measures have been discussed and provided in written form.  A prescription has been provided for azelastine nasal spray, 1-2 sprays per nostril 2 times daily as needed. Proper nasal spray technique has been discussed and demonstrated.   Nasal saline lavage (NeilMed) as needed has been recommended along with instructions for proper administration.  A prescription has been provided for montelukast 10 mg daily at bedtime.  If needed, add guaifenesin 600 mg twice a day as needed with adequate hydration.

## 2015-08-12 NOTE — Progress Notes (Addendum)
New Patient Note  RE: [name] MRN: [id number] DOB: 1925-06-07 Date of Office Visit: 08/12/2015  Referring provider: [name], MD Primary care provider: Garret Reddish, MD  Chief Complaint: Cough   History of present illness: HPI Comments: [name] is a 82 y.o. female presenting today for consultation of coughing and rhinitis.  Over the past 3-1/2 months she has experienced a persistent cough.  She reports that it feels like the cough originates at the base of her throat.  She occasionally is able to expectorate "clear, slick, thick phlegm."  If she is unable to expectorate phlegm she gags.  She also experiences rhinorrhea, sneezing, and occasional watery eyes.  The cough seems to increase shortly after assuming a recumbent position at nighttime.  She sleeps with her bed elevated, however the cough frequently disrupts her sleep.  She experiences occasional acid reflux and takes Gas-X as needed.  She does not experience chest tightness or wheezing, however she admits to dyspnea with exertion which she she believes may be related to bradycardia.  She had a chest x-ray on 07/29/2015 which was read as negative.  She has experienced similar episodes of persistent coughing in the past, lasting several months at a time.   Assessment and plan: Coughing The most common causes of chronic cough include the following: upper airway cough syndrome (UACS) which is caused by variety of rhinosinus conditions; asthma; gastroesophageal reflux disease (GERD); chronic bronchitis from cigarette smoking or other inhaled environmental irritants; non-asthmatic eosinophilic bronchitis; and bronchiectasis. In prospective studies, these conditions have accounted for up to 94% of the causes of chronic cough in immunocompetent adults. The history and physical examination suggest that her cough is multifactorial with contribution from postnasal drainage and possible acid reflux.   We will address these  issues at this time (see treatment plan as outlined below).   We will regroup in 6 weeks to assess treatment response and adjust therapy accordingly.  Perennial allergic rhinitis  Aeroallergen avoidance measures have been discussed and provided in written form.  A prescription has been provided for azelastine nasal spray, 1-2 sprays per nostril 2 times daily as needed. Proper nasal spray technique has been discussed and demonstrated.   Nasal saline lavage (NeilMed) as needed has been recommended along with instructions for proper administration.  A prescription has been provided for montelukast 10 mg daily at bedtime.  If needed, add guaifenesin 600 mg twice a day as needed with adequate hydration.  GERD (gastroesophageal reflux disease) We will avoid PPI due to possible interaction with clopidogrel.   A prescription has been provided for ranitidine 150 mg twice daily.  Appropriate reflux lifestyle modifications have been discussed and provided in written form.    Meds ordered this encounter  Medications  . Azelastine HCl 0.15 % SOLN    Sig: Use 1-2 sprays per nostril 2 times daily as needed    Dispense:  30 mL    Refill:  5  . montelukast (SINGULAIR) 10 MG tablet    Sig: Take 1 tablet (10 mg total) by mouth at bedtime.    Dispense:  30 tablet    Refill:  5  . ranitidine (ZANTAC) 150 MG/10ML syrup    Sig: Take 10 mLs (150 mg total) by mouth 2 (two) times daily.    Dispense:  300 mL    Refill:  2    Diagnositics: Spirometry: FVC was 1.75 L and FEV1 was 1.47 L (92% predicted) without postbronchodilator improvement. Epicutaneous testing: Positive  to cockroach antigen and dust mite antigen. Intradermal testing: Negative despite a positive histamine control.    Physical examination: Blood pressure 140/60, pulse 50, temperature 97.8 F (36.6 C), temperature source Oral, resp. rate 16, height 5' 3.58" (1.615 m), weight 140 lb 12.8 oz (63.866 kg), SpO2 98 %.  General:  Alert, interactive, in no acute distress. HEENT: TMs pearly gray, turbinates moderately edematous without discharge, post-pharynx moderately erythematous. Neck: Supple without lymphadenopathy. Lungs: Clear to auscultation without wheezing, rhonchi or rales. CV: Normal S1, S2 without murmurs. Abdomen: Nondistended, nontender. Skin: Warm and dry, without lesions or rashes. Extremities:  No clubbing, cyanosis or edema. Neuro:   Grossly intact.  Review of systems:  Review of Systems  Constitutional: Negative for fever, chills and weight loss.  HENT: Negative for nosebleeds.   Eyes: Negative for blurred vision.  Respiratory: Positive for cough and shortness of breath. Negative for hemoptysis.   Cardiovascular: Negative for chest pain.  Gastrointestinal: Positive for heartburn. Negative for diarrhea and constipation.  Genitourinary: Negative for dysuria.  Musculoskeletal: Negative for myalgias and joint pain.  Neurological: Negative for dizziness.  Endo/Heme/Allergies: Does not bruise/bleed easily.    Past medical history:  Past Medical History  Diagnosis Date  . Hypertension     a. Normal renal arteries by duplex 2013.  [name] PAF (paroxysmal atrial fibrillation) ([place])     a. s/p MAZE 2006. b. Event monitor 2013: NSR with PACs.  . Hypothyroidism   . PAD (peripheral artery disease) ([place])     a. 04/2013: s/p PCI to occluded right popliteal artery   . High cholesterol   . Migraine   . Stroke [facility])     a. Pt reports 3-4 prior strokes without residual sx. b. CVA 2013 (presented with headache) - started on Plavix. Event monitor as outpt - no AF.  [name] Arthritis   . Glaucoma of both eyes   . Sinus bradycardia     a. 03/2011 during hospital admission - beta blocker stopped.  . Chicken pox     Past surgical history:  Past Surgical History  Procedure Laterality Date  . Maze  ~ 2006  . Popliteal artery stent  05/15/2013  . Lower extremity angiogram N/A 05/15/2013    Procedure: LOWER EXTREMITY  ANGIOGRAM;  Surgeon: Lorretta Harp, MD;  [facility] C[facility] LAB;  Service: Cardiovascular;  Laterality: N/A;    Family history: Family History  Problem Relation Age of Onset  . Hypertension Mother   . Ulcers Father   . Cervical cancer Sister   . Throat cancer Brother   . Lung cancer Brother   . Cancer Brother   . Allergic rhinitis Neg Hx   . Angioedema Neg Hx   . Asthma Neg Hx   . Atopy Neg Hx   . Eczema Neg Hx   . Immunodeficiency Neg Hx   . Urticaria Neg Hx     Social history: Social History   Social History  . Marital Status: Married    Spouse Name: N/A  . Number of Children: N/A  . Years of Education: N/A   Occupational History  . Not on file.   Social History Main Topics  . Smoking status: Never Smoker   . Smokeless tobacco: Never Used  . Alcohol Use: No  . Drug Use: No  . Sexual Activity: No   Other Topics Concern  . Not on file   Social History Narrative   Married. Lives with husband. Married 65 years in 2016. [place] 22 years  in 2016. 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 6 greatgrandkids.    Finished 10th grade.       Retired from working in [employer] for [employer] History: The patient lives in a 80 year old house with carpeting throughout, gas heat, and central air.  She is a nonsmoker without pets.    Medication List       This list is accurate as of: 08/12/15  6:26 PM.  Always use your most recent med list.               AVAPRO 150 MG tablet  Generic drug:  irbesartan  Take 1 tablet (150 mg total) by mouth 2 (two) times daily.     Azelastine HCl 0.15 % Soln  Use 1-2 sprays per nostril 2 times daily as needed     chlorthalidone 25 MG tablet  Commonly known as:  HYGROTON  Take 1 tablet (25 mg total) by mouth daily.     clopidogrel 75 MG tablet  Commonly known as:  PLAVIX  Take 1 tablet (75 mg total) by mouth daily at 12 noon.     COSOPT 22.3-6.8 MG/ML ophthalmic  solution  Generic drug:  dorzolamide-timolol  Place 1 drop into both eyes 2 (two) times daily. Reported on 06/21/2015     fluticasone 50 MCG/ACT nasal spray  Commonly known as:  FLONASE  Place 2 sprays into both nostrils daily.     hydrALAZINE 10 MG tablet  Commonly known as:  APRESOLINE  take 1 tablet by mouth three times a day     LUMIGAN 0.01 % Soln  Generic drug:  bimatoprost     montelukast 10 MG tablet  Commonly known as:  SINGULAIR  Take 1 tablet (10 mg total) by mouth at bedtime.     OMEGA 3 PO  Take 5 mLs by mouth daily at 3 pm. One teaspoon daily     OVER THE COUNTER MEDICATION  Nutra Burst one tablespoon with orange juice daily     ranitidine 150 MG/10ML syrup  Commonly known as:  ZANTAC  Take 10 mLs (150 mg total) by mouth 2 (two) times daily.     thyroid 15 MG tablet  Commonly known as:  ARMOUR  Take 1 tablet (15 mg total) by mouth daily.        Known medication allergies: Allergies  Allergen Reactions  . Aceon [Perindopril Erbumine]     cough  . Amlodipine     Lower extremity discomfort  . Aspirin Cough  . Azor [Amlodipine-Olmesartan] Cough  . Beta Adrenergic Blockers     Bradycardia  . Hydralazine Nausea And Vomiting and Other (See Comments)    Weakness   . Vitamin D Analogs Cough    I appreciate the opportunity to take part in this Zabria's care. Please do not hesitate to contact me with questions.  Sincerely,   R. Edgar Frisk, MD

## 2015-08-12 NOTE — Assessment & Plan Note (Signed)
The most common causes of chronic cough include the following: upper airway cough syndrome (UACS) which is caused by variety of rhinosinus conditions; asthma; gastroesophageal reflux disease (GERD); chronic bronchitis from cigarette smoking or other inhaled environmental irritants; non-asthmatic eosinophilic bronchitis; and bronchiectasis. In prospective studies, these conditions have accounted for up to 94% of the causes of chronic cough in immunocompetent adults. The history and physical examination suggest that her cough is multifactorial with contribution from postnasal drainage and possible acid reflux.   We will address these issues at this time (see treatment plan as outlined below).   We will regroup in 6 weeks to assess treatment response and adjust therapy accordingly.

## 2015-08-12 NOTE — Patient Instructions (Addendum)
Coughing The most common causes of chronic cough include the following: upper airway cough syndrome (UACS) which is caused by variety of rhinosinus conditions; asthma; gastroesophageal reflux disease (GERD); chronic bronchitis from cigarette smoking or other inhaled environmental irritants; non-asthmatic eosinophilic bronchitis; and bronchiectasis. In prospective studies, these conditions have accounted for up to 94% of the causes of chronic cough in immunocompetent adults. The history and physical examination suggest that her cough is multifactorial with contribution from postnasal drainage and possible acid reflux.   We will address these issues at this time (see treatment plan as outlined below).   We will regroup in 6 weeks to assess treatment response and adjust therapy accordingly.  Perennial allergic rhinitis  Aeroallergen avoidance measures have been discussed and provided in written form.  A prescription has been provided for azelastine nasal spray, 1-2 sprays per nostril 2 times daily as needed. Proper nasal spray technique has been discussed and demonstrated.   Nasal saline lavage (NeilMed) as needed has been recommended along with instructions for proper administration.  A prescription has been provided for montelukast 10 mg daily at bedtime.  If needed, add guaifenesin 600 mg twice a day as needed with adequate hydration.  GERD (gastroesophageal reflux disease) We will avoid PPI due to possible interaction with clopidogrel.   A prescription has been provided for ranitidine 150 mg twice daily.  Appropriate reflux lifestyle modifications have been discussed and provided in written form.    Return in about 6 weeks (around 09/23/2015), or if symptoms worsen or fail to improve.  Control of House Dust Mite Allergen  House dust mites play a major role in allergic asthma and rhinitis.  They occur in environments with high humidity wherever human skin, the food for dust mites is  found. High levels have been detected in dust obtained from mattresses, pillows, carpets, upholstered furniture, bed covers, clothes and soft toys.  The principal allergen of the house dust mite is found in its feces.  A gram of dust may contain 1,000 mites and 250,000 fecal particles.  Mite antigen is easily measured in the air during house cleaning activities.    1. Encase mattresses, including the box spring, and pillow, in an air tight cover.  Seal the zipper end of the encased mattresses with wide adhesive tape. 2. Wash the bedding in water of 130 degrees Farenheit weekly.  Avoid cotton comforters/quilts and flannel bedding: the most ideal bed covering is the dacron comforter. 3. Remove all upholstered furniture from the bedroom. 4. Remove carpets, carpet padding, rugs, and non-washable window drapes from the bedroom.  Wash drapes weekly or use plastic window coverings. 5. Remove all non-washable stuffed toys from the bedroom.  Wash stuffed toys weekly. 6. Have the room cleaned frequently with a vacuum cleaner and a damp dust-mop.  The patient should not be in a room which is being cleaned and should wait 1 hour after cleaning before going into the room. 7. Close and seal all heating outlets in the bedroom.  Otherwise, the room will become filled with dust-laden air.  An electric heater can be used to heat the room. 8. Reduce indoor humidity to less than 50%.  Do not use a humidifier.  Control of Cockroach Allergen  Cockroach allergen has been identified as an important cause of acute attacks of asthma, especially in urban settings.  There are fifty-five species of cockroach that exist in the [place], however only three, the [place], [place] species produce allergen that can affect patients  with Asthma.  Allergens can be obtained from fecal particles, egg casings and secretions from cockroaches.    1. Remove food sources. 2. Reduce access to water. 3. Seal access and entry  points. 4. Spray runways with 0.5-1% Diazinon or Chlorpyrifos 5. Blow boric acid power under stoves and refrigerator. 6. Place bait stations (hydramethylnon) at feeding sites.   Lifestyle Changes for Controlling GERD  When you have GERD, stomach acid feels as if it's backing up toward your mouth. Whether or not you take medication to control your GERD, your symptoms can often be improved with lifestyle changes.   Raise Your Head  Reflux is more likely to strike when you're lying down flat, because stomach fluid can  flow backward more easily. Raising the head of your bed 4-6 inches can help. To do this:  Slide blocks or books under the legs at the head of your bed. Or, place a wedge under  the mattress. Many foam stores can make a suitable wedge for you. The wedge  should run from your waist to the top of your head.  Don't just prop your head on several pillows. This increases pressure on your  stomach. It can make GERD worse.  Watch Your Eating Habits Certain foods may increase the acid in your stomach or relax the lower esophageal sphincter, making GERD more likely. It's best to avoid the following:  Coffee, tea, and carbonated drinks (with and without caffeine)  Fatty, fried, or spicy food  Mint, chocolate, onions, and tomatoes  Any other foods that seem to irritate your stomach or cause you pain  Relieve the Pressure  Eat smaller meals, even if you have to eat more often.  Don't lie down right after you eat. Wait a few hours for your stomach to empty.  Avoid tight belts and tight-fitting clothes.  Lose excess weight.  Tobacco and Alcohol  Avoid smoking tobacco and drinking alcohol. They can make GERD symptoms worse.

## 2015-08-12 NOTE — Assessment & Plan Note (Addendum)
We will avoid PPI due to possible interaction with clopidogrel.   A prescription has been provided for ranitidine 150 mg twice daily.  Appropriate reflux lifestyle modifications have been discussed and provided in written form.

## 2015-08-14 ENCOUNTER — Ambulatory Visit (INDEPENDENT_AMBULATORY_CARE_PROVIDER_SITE_OTHER): Payer: Medicare Other | Admitting: Family Medicine

## 2015-08-14 ENCOUNTER — Telehealth: Payer: Self-pay | Admitting: *Deleted

## 2015-08-14 VITALS — BP 150/80 | HR 50 | Temp 97.8°F | Ht 63.5 in | Wt 139.0 lb

## 2015-08-14 DIAGNOSIS — W57XXXA Bitten or stung by nonvenomous insect and other nonvenomous arthropods, initial encounter: Secondary | ICD-10-CM

## 2015-08-14 DIAGNOSIS — T148 Other injury of unspecified body region: Secondary | ICD-10-CM

## 2015-08-14 NOTE — Telephone Encounter (Signed)
FYI: Dr Yong Channel pt that is on your schedule this afternoon

## 2015-08-14 NOTE — Patient Instructions (Signed)
Use OTC hydrocortisone cream for itching.

## 2015-08-14 NOTE — Progress Notes (Signed)
   Subjective:    Patient ID: [name], female    DOB: [birth year], 82 y.o.   MRN: [id number]  HPI  Acute visit. Patient had tick bite about 4 weeks ago left lower inner thigh. She's had some persistent itching. She thinks she removed the tick in entirety. No fevers or chills. No headaches. No other skin rashes. Has not tried anything topically.  Past Medical History  Diagnosis Date  . Hypertension     a. Normal renal arteries by duplex 2013.  [name] PAF (paroxysmal atrial fibrillation) ([place])     a. s/p MAZE 2006. b. Event monitor 2013: NSR with PACs.  . Hypothyroidism   . PAD (peripheral artery disease) ([place])     a. 04/2013: s/p PCI to occluded right popliteal artery   . High cholesterol   . Migraine   . Stroke [facility])     a. Pt reports 3-4 prior strokes without residual sx. b. CVA 2013 (presented with headache) - started on Plavix. Event monitor as outpt - no AF.  [name] Arthritis   . Glaucoma of both eyes   . Sinus bradycardia     a. 03/2011 during hospital admission - beta blocker stopped.  . Chicken pox    Past Surgical History  Procedure Laterality Date  . Maze  ~ 2006  . Popliteal artery stent  05/15/2013  . Lower extremity angiogram N/A 05/15/2013    Procedure: LOWER EXTREMITY ANGIOGRAM;  Surgeon: Lorretta Harp, MD;  Location: [facility] CATH LAB;  Service: Cardiovascular;  Laterality: N/A;    reports that she has never smoked. She has never used smokeless tobacco. She reports that she does not drink alcohol or use illicit drugs. family history includes Cancer in her brother; Cervical cancer in her sister; Hypertension in her mother; Lung cancer in her brother; Throat cancer in her brother; Ulcers in her father. There is no history of Allergic rhinitis, Angioedema, Asthma, Atopy, Eczema, Immunodeficiency, or Urticaria. Allergies  Allergen Reactions  . Aceon [Perindopril Erbumine]     cough  . Amlodipine     Lower extremity discomfort  . Aspirin Cough  . Azor  [Amlodipine-Olmesartan] Cough  . Beta Adrenergic Blockers     Bradycardia  . Hydralazine Nausea And Vomiting and Other (See Comments)    Weakness   . Vitamin D Analogs Cough     Review of Systems  Constitutional: Negative for fever and chills.  Neurological: Negative for headaches.  Hematological: Negative for adenopathy.       Objective:   Physical Exam  Constitutional: She appears well-developed and well-nourished.  Cardiovascular: Normal rate and regular rhythm.   Pulmonary/Chest: Effort normal and breath sounds normal. No respiratory distress. She has no wheezes. She has no rales.  Skin:  Left lower inner thigh approximately 2 mm area of minimal erythema which is macular nonraised. Nontender. No pustules. No palpable foreign body.          Assessment & Plan:  Recent tick bite. Suspected mild local allergic response. She's not had any worrisome symptoms such as erythema migrans, headaches, arthralgias, or fever. She will try some over-the-counter topical hydrocortisone cream as needed. Follow-up as needed  Eulas Post MD [facility]

## 2015-08-14 NOTE — Progress Notes (Signed)
Pre visit review using our clinic review tool, if applicable. No additional management support is needed unless otherwise documented below in the visit note. 

## 2015-08-14 NOTE — Telephone Encounter (Signed)
Call Id: [id number] [facility] Night - Client [place] Patient Name: [name] Gender: Female DOB: 17-Nov-1925 Age: 82 Y 3 M 11 D Return Phone Number: [phone] (Primary), [phone] (Secondary) Address: City/State/Zip: [place] Client  [facility] Night - Client Client Site [place] - Night Physician Garret Reddish - MD Contact Type Call Who Is Calling Patient / Member / Family / Caregiver Call Type Triage / Clinical Relationship To Patient Self Return Phone Number [phone] (Primary) Chief Complaint Insect Bite Reason for Call Symptomatic / Request for [place] states that she was bitten by a tick on her left leg above the knee 3-4 weeks ago and the spot is red and itching. PreDisposition Call Doctor Translation No Nurse Assessment Nurse: Venetia Maxon, RN, Manuela Schwartz Date/Time (Eastern Time): 08/13/2015 5:14:14 PM Confirm and document reason for call. If symptomatic, describe symptoms. You must click the next button to save text entered. ---Caller states that she was bitten by a tick on her left leg above the knee 3-4 weeks ago and the spot is red and itching. She pulled the tick and hopes she pulled the entire tick, out. The area looks like a mosquito bite. the size of a pencil eraser. current temp AX is 98.0 Has the patient traveled out of the country within the last 30 days? ---No Does the patient have any new or worsening symptoms? ---Yes Will a triage be completed? ---Yes Related visit to physician within the last 2 weeks? ---No Does the PT have any chronic conditions? (i.e. diabetes, asthma, etc.) ---Yes List chronic conditions. ---saw allergist yesterday for allergies and testing . all to dust allergy HTN thyroid Reflux sinus problems. Is this a behavioral health or substance abuse call? ---No Guidelines Guideline Title  Affirmed Question Affirmed Notes Nurse Date/Time (Eastern Time) Tick Bite [1] Fever AND [2] red area Venetia Maxon, RN, Manuela Schwartz 08/13/2015 5:20:36 PM Disp. Time Eilene Ghazi Time) Disposition Final User PLEASE NOTE: All timestamps contained within this report are represented as [place] Standard Time. CONFIDENTIALTY NOTICE: This fax transmission is intended only for the addressee. It contains information that is legally privileged, confidential or otherwise protected from use or disclosure. If you are not the intended recipient, you are strictly prohibited from reviewing, disclosing, copying using or disseminating any of this information or taking any action in reliance on or regarding this information. If you have received this fax in error, please notify us immediately by telephone so that we can arrange for its return to [place]. Phone: [phone], Toll-Free: [phone], Fax: [phone] Page: 2 of 2 Call Id: [id number] 08/13/2015 5:22:30 PM See Physician within 4 Hours (or PCP triage) Yes Venetia Maxon, RN, Edwena Bunde Understands: Yes Disagree/Comply: Comply Care Advice Given Per Guideline SEE PHYSICIAN WITHIN 4 HOURS (or PCP triage): CALL BACK IF: * You become worse. CARE ADVICE given per Tick Bites (Adult) guideline. Referrals REFERRED TO PCP OFFICE

## 2015-09-02 ENCOUNTER — Ambulatory Visit: Payer: Self-pay | Admitting: Allergy and Immunology

## 2015-09-09 ENCOUNTER — Ambulatory Visit: Payer: Self-pay | Admitting: Allergy and Immunology

## 2015-09-17 ENCOUNTER — Ambulatory Visit: Payer: Medicare Other | Admitting: Family Medicine

## 2015-09-18 ENCOUNTER — Ambulatory Visit (INDEPENDENT_AMBULATORY_CARE_PROVIDER_SITE_OTHER): Payer: Medicare Other | Admitting: Family Medicine

## 2015-09-18 ENCOUNTER — Encounter: Payer: Self-pay | Admitting: Family Medicine

## 2015-09-18 DIAGNOSIS — I10 Essential (primary) hypertension: Secondary | ICD-10-CM

## 2015-09-18 NOTE — Progress Notes (Signed)
Pre visit review using our clinic review tool, if applicable. No additional management support is needed unless otherwise documented below in the visit note. 

## 2015-09-18 NOTE — Patient Instructions (Signed)
Continue avapro 150mg  twice a day  Continue half a pill of chlorthalidone  Increase hydralazine 10mg  to twice a day  Follow up 3 months

## 2015-09-18 NOTE — Assessment & Plan Note (Signed)
S: poorly controlled on  irbesartan 150mg  BID, hydralazine 10mg  daily, chlorthalidone 12.5mg  BP Readings from Last 3 Encounters:  09/18/15 (!) 154/78  08/14/15 (!) 150/80  08/12/15 140/60  A/P:Continue current meds:  But increase hydralazine back to twice a day- previously had some #s as low as low 100s on this regimen but BP in office now running higher and most #s at home in 150s- much prefer to keep her in 140 range. Follow up 3 months.

## 2015-09-18 NOTE — Progress Notes (Signed)
Subjective:  [name] is a 82 y.o. year old very pleasant female patient who presents for/with See problem oriented charting ROS- chronic cough resolved with adjustments by allergist, no chest pain, shortness of breath, blurry vision. see any ROS included in HPI as well.   Past Medical History-  Patient Active Problem List   Diagnosis Date Noted  . Critical lower limb ischemia/PAD 05/10/2013    Priority: High  . PAF (paroxysmal atrial fibrillation) (HCC)     Priority: High  . Stroke [facility])     Priority: High  . Hypothyroidism 06/07/2014    Priority: Medium  . Hyperlipidemia 06/07/2014    Priority: Medium  . Hypertension     Priority: Medium  . GERD (gastroesophageal reflux disease) 06/25/2014    Priority: Low  . Hyperglycemia 06/25/2014    Priority: Low  . Vitamin D deficiency 06/25/2014    Priority: Low  . Intracervical pessary 06/07/2014    Priority: Low  . Sinus bradycardia 05/19/2013    Priority: Low  . PAC (premature atrial contraction) 05/20/2011    Priority: Low  . Coughing 08/12/2015  . Perennial allergic rhinitis 08/12/2015    Medications- reviewed and updated Current Outpatient Prescriptions  Medication Sig Dispense Refill  . AVAPRO 150 MG tablet Take 1 tablet (150 mg total) by mouth 2 (two) times daily. 180 tablet 3  . chlorthalidone (HYGROTON) 25 MG tablet Take 1 tablet (25 mg total) by mouth daily. 30 tablet 5  . clopidogrel (PLAVIX) 75 MG tablet Take 1 tablet (75 mg total) by mouth daily at 12 noon. 90 tablet 3  . COSOPT 22.3-6.8 MG/ML ophthalmic solution Place 1 drop into both eyes 2 (two) times daily. Reported on 06/21/2015  0  . hydrALAZINE (APRESOLINE) 10 MG tablet take 1 tablet by mouth three times a day 90 tablet 3  . Omega-3 Fatty Acids (OMEGA 3 PO) Take 5 mLs by mouth daily at 3 pm. One teaspoon daily    . OVER THE COUNTER MEDICATION Nutra Burst one tablespoon with orange juice daily    . thyroid (ARMOUR) 15 MG tablet Take 1 tablet (15 mg total)  by mouth daily. 90 tablet 3  . Azelastine HCl 0.15 % SOLN Use 1-2 sprays per nostril 2 times daily as needed (Patient not taking: Reported on 09/18/2015) 30 mL 5  . fluticasone (FLONASE) 50 MCG/ACT nasal spray Place 2 sprays into both nostrils daily. (Patient not taking: Reported on 09/18/2015) 16 g 6  . LUMIGAN 0.01 % SOLN   0  . montelukast (SINGULAIR) 10 MG tablet Take 1 tablet (10 mg total) by mouth at bedtime. (Patient not taking: Reported on 09/18/2015) 30 tablet 5  . ranitidine (ZANTAC) 150 MG/10ML syrup Take 10 mLs (150 mg total) by mouth 2 (two) times daily. (Patient not taking: Reported on 09/18/2015) 300 mL 2   No current facility-administered medications for this visit.     Objective: BP (!) 154/78   Pulse (!) 52   Temp 97.7 F (36.5 C) (Oral)   Wt 141 lb 9.6 oz (64.2 kg)   LMP  (Approximate) Comment: 1984  SpO2 98%   BMI 24.69 kg/m  Gen: NAD, resting comfortably CV: RRR no murmurs rubs or gallops Lungs: CTAB no crackles, wheeze, rhonchi Ext: no edema Skin: warm, dry Neuro: grossly normal, moves all extremities  Assessment/Plan:  Hypertension S: poorly controlled on  irbesartan 150mg  BID, hydralazine 10mg  daily, chlorthalidone 12.5mg  BP Readings from Last 3 Encounters:  09/18/15 (!) 154/78  08/14/15 [name])  150/80  08/12/15 140/60  A/P:Continue current meds:  But increase hydralazine back to twice a day- previously had some #s as low as low 100s on this regimen but BP in office now running higher and most #s at home in 150s- much prefer to keep her in 140 range. Follow up 3 months.    Return in about 3 months (around 12/19/2015).  Return precautions advised.  Garret Reddish, MD

## 2015-09-23 ENCOUNTER — Encounter (INDEPENDENT_AMBULATORY_CARE_PROVIDER_SITE_OTHER): Payer: Self-pay

## 2015-09-23 ENCOUNTER — Encounter: Payer: Self-pay | Admitting: Allergy and Immunology

## 2015-09-23 ENCOUNTER — Ambulatory Visit (INDEPENDENT_AMBULATORY_CARE_PROVIDER_SITE_OTHER): Payer: Medicare Other | Admitting: Allergy and Immunology

## 2015-09-23 VITALS — BP 130/70 | HR 62 | Temp 98.7°F | Resp 16

## 2015-09-23 DIAGNOSIS — J3089 Other allergic rhinitis: Secondary | ICD-10-CM | POA: Diagnosis not present

## 2015-09-23 DIAGNOSIS — R05 Cough: Secondary | ICD-10-CM | POA: Diagnosis not present

## 2015-09-23 DIAGNOSIS — K219 Gastro-esophageal reflux disease without esophagitis: Secondary | ICD-10-CM | POA: Diagnosis not present

## 2015-09-23 DIAGNOSIS — R059 Cough, unspecified: Secondary | ICD-10-CM

## 2015-09-23 NOTE — Assessment & Plan Note (Signed)
   Continue appropriate reflux lifestyle modifications and ranitidine 150 mg twice a day as needed.

## 2015-09-23 NOTE — Assessment & Plan Note (Signed)
   Continue appropriate allergen avoidance measures, azelastine nasal spray as needed, and/or nasal saline irrigation as needed.

## 2015-09-23 NOTE — Assessment & Plan Note (Signed)
Improved.    Continue as needed treatment of nasal symptoms and reflux symptoms, as below.

## 2015-09-23 NOTE — Patient Instructions (Signed)
Coughing Improved.    Continue as needed treatment of nasal symptoms and reflux symptoms, as below.  Perennial allergic rhinitis  Continue appropriate allergen avoidance measures, azelastine nasal spray as needed, and/or nasal saline irrigation as needed.  GERD (gastroesophageal reflux disease)  Continue appropriate reflux lifestyle modifications and ranitidine 150 mg twice a day as needed.   Return in about 4 months (around 01/23/2016), or if symptoms worsen or fail to improve.

## 2015-09-23 NOTE — Progress Notes (Signed)
Follow-up Note  RE: [name] MRN: [id number] DOB: [birth year] Date of Office Visit: 09/23/2015  Primary care provider: Garret Reddish, MD Referring provider: [name], MD  History of present illness: [name] is a 82 y.o. female with allergic rhinitis, acid reflux, and history of persistent cough presenting today for follow up. She was seen in this office for her initial consultation on 08/09/2015.  She reports that in the interval since that visit her symptoms improved significantly. In fact, her cough, which had been her chief complaint, resolved completely. As a result, approximately 2 weeks ago she discontinued  azelastine nasal spray, nasal saline lavage, and ranitidine.  She never started montelukast due to concern of potential side effects. She notes that the cough began to return over the past couple days, however she restart the ranitidine and the symptoms improved.  She has no nasal symptom complaints or new complaints today.    Assessment and plan: Coughing Improved.    Continue as needed treatment of nasal symptoms and reflux symptoms, as below.  Perennial allergic rhinitis  Continue appropriate allergen avoidance measures, azelastine nasal spray as needed, and/or nasal saline irrigation as needed.  GERD (gastroesophageal reflux disease)  Continue appropriate reflux lifestyle modifications and ranitidine 150 mg twice a day as needed.   Diagnositics: Spirometry:  Normal with an FEV1 of 103% predicted.  Please see scanned spirometry results for details.    Physical examination: Blood pressure 130/70, pulse 62, temperature 98.7 F (37.1 C), temperature source Oral, resp. rate 16, SpO2 98 %.  General: Alert, interactive, in no acute distress. HEENT: TMs pearly gray, turbinates mildly edematous without discharge, post-pharynx mildly erythematous. Neck: Supple without lymphadenopathy. Lungs: Clear to auscultation without wheezing, rhonchi or  rales. CV: Normal S1, S2 without murmurs. Skin: Warm and dry, without lesions or rashes.  The following portions of the patient's history were reviewed and updated as appropriate: allergies, current medications, past family history, past medical history, past social history, past surgical history and problem list.    Medication List       Accurate as of 09/23/15  1:31 PM. Always use your most recent med list.          AVAPRO 150 MG tablet Generic drug:  irbesartan Take 1 tablet (150 mg total) by mouth 2 (two) times daily.   Azelastine HCl 0.15 % Soln Use 1-2 sprays per nostril 2 times daily as needed   chlorthalidone 25 MG tablet Commonly known as:  HYGROTON Take 1 tablet (25 mg total) by mouth daily.   clopidogrel 75 MG tablet Commonly known as:  PLAVIX Take 1 tablet (75 mg total) by mouth daily at 12 noon.   COSOPT 22.3-6.8 MG/ML ophthalmic solution Generic drug:  dorzolamide-timolol Place 1 drop into both eyes 2 (two) times daily. Reported on 06/21/2015   fluticasone 50 MCG/ACT nasal spray Commonly known as:  FLONASE Place 2 sprays into both nostrils daily.   hydrALAZINE 10 MG tablet Commonly known as:  APRESOLINE take 1 tablet by mouth three times a day   LUMIGAN 0.01 % Soln Generic drug:  bimatoprost   OMEGA 3 PO Take 5 mLs by mouth daily at 3 pm. One teaspoon daily   OVER THE COUNTER MEDICATION Nutra Burst one tablespoon with orange juice daily   ranitidine 150 MG/10ML syrup Commonly known as:  ZANTAC Take 10 mLs (150 mg total) by mouth 2 (two) times daily.   thyroid 15 MG tablet Commonly known as:  ARMOUR Take  1 tablet (15 mg total) by mouth daily.       Allergies  Allergen Reactions  . Aceon [Perindopril Erbumine]     cough  . Amlodipine     Lower extremity discomfort  . Aspirin Cough  . Azor [Amlodipine-Olmesartan] Cough  . Beta Adrenergic Blockers     Bradycardia  . Hydralazine Nausea And Vomiting and Other (See Comments)    Weakness    . Vitamin D Analogs Cough    I appreciate the opportunity to take part in Trishna's care. Please do not hesitate to contact me with questions.  Sincerely,   R. Edgar Frisk, MD

## 2015-09-24 ENCOUNTER — Encounter: Payer: Self-pay | Admitting: Family Medicine

## 2015-09-24 ENCOUNTER — Ambulatory Visit (INDEPENDENT_AMBULATORY_CARE_PROVIDER_SITE_OTHER): Payer: Medicare Other | Admitting: Family Medicine

## 2015-09-24 VITALS — BP 144/64 | HR 45 | Temp 97.8°F | Ht 63.5 in | Wt 140.0 lb

## 2015-09-24 DIAGNOSIS — R001 Bradycardia, unspecified: Secondary | ICD-10-CM

## 2015-09-24 DIAGNOSIS — E039 Hypothyroidism, unspecified: Secondary | ICD-10-CM

## 2015-09-24 DIAGNOSIS — R600 Localized edema: Secondary | ICD-10-CM | POA: Diagnosis not present

## 2015-09-24 LAB — TSH: TSH: 3.13 u[IU]/mL (ref 0.35–4.50)

## 2015-09-24 NOTE — Patient Instructions (Addendum)
   [name] I have seen you today for an acute visit because your primary care provider was not available. Monitor for signs of worsening symptoms and seek immediate medical attention if any concerning/warning symptom as we discussed. If symptoms are not resolved in 1-2 weeks you should schedule a follow up appointment with your doctor, before if symptoms get worse.  Please continue following with your PCP for your other chronic medical problems and be sure you have an appointment already scheduled.   A few things to remember from today's visit:   Edema of right lower extremity  Hypothyroidism, unspecified hypothyroidism type - Plan: TSH  Sinus bradycardia, chronic  I don't think swelling of right leg is concerning today. Elevation above heart level recommended, monitor for warning signs.  Continue monitoring blood pressure at home and heart rate, will follow up with heart doctor as planned.  Please be sure medication list is accurate. If a new problem present, please set up appointment sooner than planned today.

## 2015-09-24 NOTE — Progress Notes (Signed)
HPI:  ACUTE VISIT:  Chief Complaint  Patient presents with  . Leg Swelling    Right, 2 days ago, pt has a stent in the back of the leg, the swelling goes down at night.     [name] is a 82 y.o. female, who is here today with her husband complaining of RLE edema noted 2 days ago.  She has history of PVD, s/p stent RLE. She denies any recent trauma, erythema or leg pain. She has not noted cold extremities or cyanosis. No tinging, burning, ot numbness.  For the past few days she has been walking more than usual and standing more than usual.  Denies severe/frequent headache, visual changes, chest pain, dyspnea, palpitation, claudication, focal weakness, or rash.   Noted bradycardia, according to pt, she "always" has low HR. She follows with cardiologists and vascular annually. Occasional lightheadedness but unchanged for a while.  She monitor BP at home, 140-150/80's.  On Avapro, Hydralazine,and Plavix. Hx of hypothyroidism, she is currently on Thyroid Armour 15 mg daily.  Lab Results  Component Value Date   TSH 3.09 06/22/2014   Lab Results  Component Value Date   CREATININE 1.05 (H) 10/11/2014   BUN 10 10/11/2014   NA 141 10/11/2014   K 3.9 10/11/2014   CL 107 10/11/2014   CO2 26 10/11/2014      Review of Systems  Constitutional: Negative for activity change, appetite change, fatigue, fever and unexpected weight change.  HENT: Negative for mouth sores, nosebleeds and trouble swallowing.   Eyes: Negative for redness and visual disturbance.  Respiratory: Negative for cough, shortness of breath and wheezing.   Cardiovascular: Positive for leg swelling (RLE). Negative for chest pain and palpitations.  Gastrointestinal: Negative for abdominal pain, nausea and vomiting.       Negative for changes in bowel habits.  Genitourinary: Negative for decreased urine volume, difficulty urinating, dysuria and hematuria.  Musculoskeletal: Negative for arthralgias,  back pain and joint swelling.  Neurological: Positive for light-headedness (Occasionally). Negative for seizures, syncope, weakness, numbness and headaches.  Psychiatric/Behavioral: Negative for confusion. The patient is not nervous/anxious.       Current Outpatient Prescriptions on File Prior to Visit  Medication Sig Dispense Refill  . AVAPRO 150 MG tablet Take 1 tablet (150 mg total) by mouth 2 (two) times daily. 180 tablet 3  . Azelastine HCl 0.15 % SOLN Use 1-2 sprays per nostril 2 times daily as needed 30 mL 5  . chlorthalidone (HYGROTON) 25 MG tablet Take 1 tablet (25 mg total) by mouth daily. 30 tablet 5  . clopidogrel (PLAVIX) 75 MG tablet Take 1 tablet (75 mg total) by mouth daily at 12 noon. 90 tablet 3  . COSOPT 22.3-6.8 MG/ML ophthalmic solution Place 1 drop into both eyes 2 (two) times daily. Reported on 06/21/2015  0  . fluticasone (FLONASE) 50 MCG/ACT nasal spray Place 2 sprays into both nostrils daily. 16 g 6  . hydrALAZINE (APRESOLINE) 10 MG tablet take 1 tablet by mouth three times a day 90 tablet 3  . LUMIGAN 0.01 % SOLN   0  . Omega-3 Fatty Acids (OMEGA 3 PO) Take 5 mLs by mouth daily at 3 pm. One teaspoon daily    . OVER THE COUNTER MEDICATION Nutra Burst one tablespoon with orange juice daily    . ranitidine (ZANTAC) 150 MG/10ML syrup Take 10 mLs (150 mg total) by mouth 2 (two) times daily. 300 mL 2  . thyroid (ARMOUR) 15  MG tablet Take 1 tablet (15 mg total) by mouth daily. 90 tablet 3   No current facility-administered medications on file prior to visit.      Past Medical History:  Diagnosis Date  . Arthritis   . Chicken pox   . Glaucoma of both eyes   . High cholesterol   . Hypertension    a. Normal renal arteries by duplex 2013.  [name] Hypothyroidism   . Migraine   . PAD (peripheral artery disease) ([place])    a. 04/2013: s/p PCI to occluded right popliteal artery   . PAF (paroxysmal atrial fibrillation) ([place])    a. s/p MAZE 2006. b. Event monitor 2013: NSR  with PACs.  . Sinus bradycardia    a. 03/2011 during hospital admission - beta blocker stopped.  . Stroke [facility])    a. Pt reports 3-4 prior strokes without residual sx. b. CVA 2013 (presented with headache) - started on Plavix. Event monitor as outpt - no AF.   Allergies  Allergen Reactions  . Aceon [Perindopril Erbumine]     cough  . Amlodipine     Lower extremity discomfort  . Aspirin Cough  . Azor [Amlodipine-Olmesartan] Cough  . Beta Adrenergic Blockers     Bradycardia  . Hydralazine Nausea And Vomiting and Other (See Comments)    Weakness   . Vitamin D Analogs Cough    Social History   Social History  . Marital status: Married    Spouse name: N/A  . Number of children: N/A  . Years of education: N/A   Social History Main Topics  . Smoking status: Never Smoker  . Smokeless tobacco: Never Used  . Alcohol use No  . Drug use: No  . Sexual activity: No   Other Topics Concern  . None   Social History Narrative   Married. Lives with husband. Married 65 years in 2016. [place] 22 years in 2016. 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 6 greatgrandkids.    Finished 10th grade.       Retired from working in [employer] for [employer]             Vitals:   09/24/15 1337  BP: (!) 144/64  Pulse: (!) 45  Temp: 97.8 F (36.6 C)   Body mass index is 24.41 kg/m.   O2 sat at RA 95%   Physical Exam  Constitutional: She is oriented to person, place, and time. She appears well-developed and well-nourished. She does not appear ill. No distress.  HENT:  Head: Atraumatic.  Eyes: Conjunctivae are normal.  Cardiovascular: Regular rhythm.  Bradycardia present.   No murmur heard. Pulses:      Dorsalis pedis pulses are 2+ on the right side, and 2+ on the left side.       Posterior tibial pulses are 2+ on the right side.  Normal capillary refills. No calf tenderness (right) or tightness. Mild varicose veins.   Respiratory: Effort normal  and breath sounds normal. No respiratory distress.  Musculoskeletal: She exhibits no edema (trace pitting edema RLE).       Left lower leg: She exhibits no tenderness.  Neurological: She is alert and oriented to person, place, and time. She has normal strength. Coordination normal.  Skin: Skin is warm. No lesion noted. No erythema.  Psychiatric: She has a normal mood and affect.  Well groomed, good eye contact.      ASSESSMENT AND PLAN:  Lab Results  Component Value Date  TSH 3.13 09/24/2015      Beckham was seen today for leg swelling.  Diagnoses and all orders for this visit:  Edema of right lower extremity  Possible causes discussed. I think it may be related to vein disease, it does not seem to be concerning at this point. She was clearly instructed about warning signs. Elevation of right lower extremities above heart level a few times during the day. Follow-up as needed.   Hypothyroidism, unspecified hypothyroidism type  No changes in current management, will follow labs done today and will give further recommendations accordingly.   -     TSH  Sinus bradycardia, chronic  Otherwise asymptomatic and chronic. Recommended monitoring heart rate at home. Instructed about warning signs.         -[name] was advised to return or notify a doctor immediately if symptoms worsen or persist or new concerns arise, she and her husband voice understanding.       Fernando Stoiber G. [place], MD  [facility]. [place] office.

## 2015-09-24 NOTE — Progress Notes (Signed)
Pre visit review using our clinic review tool, if applicable. No additional management support is needed unless otherwise documented below in the visit note. 

## 2015-11-04 ENCOUNTER — Other Ambulatory Visit: Payer: Self-pay | Admitting: Family Medicine

## 2015-11-04 DIAGNOSIS — N8189 Other female genital prolapse: Secondary | ICD-10-CM | POA: Diagnosis not present

## 2015-11-26 ENCOUNTER — Telehealth: Payer: Self-pay | Admitting: Cardiovascular Disease

## 2015-11-26 NOTE — Telephone Encounter (Signed)
New message  Patient c/o Palpitations:  High priority if patient c/o lightheadedness and shortness of breath.  1. How long have you been having palpitations? A week  2. Are you currently experiencing lightheadedness and shortness of breath? no  3. Have you checked your BP and heart rate? (document readings) 129/60/50  4. Are you experiencing any other symptoms? No    I scheduled an appt for tomorrow at 4pm.

## 2015-11-26 NOTE — Telephone Encounter (Signed)
Spoke with pt. She reports palpitations off and on for last 2 weeks. States at current time heart rate is 53.  Appointment has been scheduled for pt to see Dr. Angelena Form tomorrow at 4:00.  I told pt if symptoms worsen prior to this appt she should go to ED for evaluation.

## 2015-11-27 ENCOUNTER — Encounter: Payer: Self-pay | Admitting: Cardiovascular Disease

## 2015-11-27 ENCOUNTER — Ambulatory Visit (INDEPENDENT_AMBULATORY_CARE_PROVIDER_SITE_OTHER): Payer: Medicare Other | Admitting: Cardiovascular Disease

## 2015-11-27 ENCOUNTER — Encounter (INDEPENDENT_AMBULATORY_CARE_PROVIDER_SITE_OTHER): Payer: Self-pay

## 2015-11-27 VITALS — BP 154/60 | HR 54 | Ht 63.5 in | Wt 140.4 lb

## 2015-11-27 DIAGNOSIS — I10 Essential (primary) hypertension: Secondary | ICD-10-CM | POA: Diagnosis not present

## 2015-11-27 DIAGNOSIS — I491 Atrial premature depolarization: Secondary | ICD-10-CM | POA: Diagnosis not present

## 2015-11-27 DIAGNOSIS — R001 Bradycardia, unspecified: Secondary | ICD-10-CM | POA: Diagnosis not present

## 2015-11-27 DIAGNOSIS — I739 Peripheral vascular disease, unspecified: Secondary | ICD-10-CM

## 2015-11-27 DIAGNOSIS — I480 Paroxysmal atrial fibrillation: Secondary | ICD-10-CM

## 2015-11-27 NOTE — Progress Notes (Signed)
Chief Complaint  Patient presents with  . Palpitations     History of Present Illness: 82 yo AAF with history of HTN, CVA, PAF s/p MAZE 2006, PAD who is here today for cardiac follow up. She was admitted to [facility] on 04/[facility]/11 with a headache, elevated BP and sinus bradycardia. She had initially been tried on Bystolic but did not tolerate due to bradycardia. I saw her as a Optometrist for bradycardia and we held her beta blocker. Brain MRI on 04/02/11 showed acute 1 cm right occipital cortical infarct without hemorrhage. No mass lesion or hydrocephalus. Atrophy and small vessel disease. Remote bihemispheric infarcts. She was seen by Neurology and given her history of remote infarcts and history of PAF in the past before admission, recommendation was made an event monitor to exclude recurrence of atrial fibrillation. She was started on Plavix in the hospital. She was started on Norvasc in the hospital but had burning in her legs so she stopped it. Echo 04/02/11 with normal LV size and function. Event monitor in 2013 showed NSR with PACs.No evidence of atrial fib. Renal artery dopplers November 2013 without evidence of renal artery stenosis. I started HCTZ in October 2013 but she did not want to try this medication. She was was admitted 3/23-3/24/15 for planned lower extremity angiogram due to significant RLE claudication. She underwent successful balloon/stenting of an occluded right popliteal artery per Dr. Gwenlyn Found. She was continued on Plavix as she is intolerant to aspirin. She had issues with significant hypertension during that admission and was treated with IV NTG/hydralazine to bring her BP down. Coreg was prescribed, however, the patient was reluctant to take this due to her history of sinus bradycardia and baseline HR typically around 54. She was seen by Melina Copa, PA-C on 05/19/13 in the office for elevated BP. She reported compliance with Benicar. She was started on Hydralazine 50 mg po TID  but she stopped this due to dizziness. I started clonidine 0.1mg  po BID but she had more bradycardia so Azor was added in primary care but this caused LE edema. Echo June 2015 at [facility] with normal LV function. She has most recently been on Avapro 150 mg po BID, Hydralazine 10 mg QD and Chlorthalidone 25 mg daily.  She saw Dr. Gwenlyn Found April 2017 and her PAD is felt to be stable.   She is here today for follow up. No chest pain or SOB. No palpitations. She cut her Hydralazine back to once daily since her systolic BP was 938 and she felt this was too low. BP has been controlled at home with highest readings 101B systolic.    Primary Care Physician:  Garret Reddish, MD  Past Medical History:  Diagnosis Date  . Arthritis   . Chicken pox   . Glaucoma of both eyes   . High cholesterol   . Hypertension    a. Normal renal arteries by duplex 2013.  [name] Hypothyroidism   . Migraine   . PAD (peripheral artery disease) ([place])    a. 04/2013: s/p PCI to occluded right popliteal artery   . PAF (paroxysmal atrial fibrillation) ([place])    a. s/p MAZE 2006. b. Event monitor 2013: NSR with PACs.  . Sinus bradycardia    a. 03/2011 during hospital admission - beta blocker stopped.  . Stroke [facility])    a. Pt reports 3-4 prior strokes without residual sx. b. CVA 2013 (presented with headache) - started on Plavix. Event monitor as outpt - no AF.  Past Surgical History:  Procedure Laterality Date  . LOWER EXTREMITY ANGIOGRAM N/A 05/15/2013   Procedure: LOWER EXTREMITY ANGIOGRAM;  Surgeon: Lorretta Harp, MD;  Location: [facility] CATH LAB;  Service: Cardiovascular;  Laterality: N/A;  . MAZE  ~ 2006  . POPLITEAL ARTERY STENT  05/15/2013    Current Outpatient Prescriptions  Medication Sig Dispense Refill  . AVAPRO 150 MG tablet Take 1 tablet (150 mg total) by mouth 2 (two) times daily. 180 tablet 3  . chlorthalidone (HYGROTON) 25 MG tablet Take 1 tablet (25 mg total) by mouth daily. 30 tablet 5  . clopidogrel (PLAVIX) 75  MG tablet Take 1 tablet (75 mg total) by mouth daily at 12 noon. 90 tablet 3  . COSOPT 22.3-6.8 MG/ML ophthalmic solution Place 1 drop into both eyes 2 (two) times daily. Reported on 06/21/2015  0  . hydrALAZINE (APRESOLINE) 10 MG tablet Take 10 mg by mouth daily.    [name] LUMIGAN 0.01 % SOLN Place 1 drop into both eyes at bedtime.   0  . Omega-3 Fatty Acids (OMEGA 3 PO) Take 5 mLs by mouth daily at 3 pm. One teaspoon daily    . OVER THE COUNTER MEDICATION Nutra Burst one tablespoon with orange juice daily    . ranitidine (ZANTAC) 150 MG/10ML syrup Take 10 mLs (150 mg total) by mouth 2 (two) times daily. 300 mL 2  . thyroid (ARMOUR) 15 MG tablet Take 1 tablet (15 mg total) by mouth daily. 90 tablet 3   No current facility-administered medications for this visit.     Allergies  Allergen Reactions  . Aceon [Perindopril Erbumine]     cough  . Amlodipine     Lower extremity discomfort  . Aspirin Cough  . Azor [Amlodipine-Olmesartan] Cough  . Beta Adrenergic Blockers     Bradycardia  . Hydralazine Nausea And Vomiting and Other (See Comments)    Weakness   . Vitamin D Analogs Cough    Social History   Social History  . Marital status: Married    Spouse name: N/A  . Number of children: N/A  . Years of education: N/A   Occupational History  . Not on file.   Social History Main Topics  . Smoking status: Never Smoker  . Smokeless tobacco: Never Used  . Alcohol use No  . Drug use: No  . Sexual activity: No   Other Topics Concern  . Not on file   Social History Narrative   Married. Lives with husband. Married 65 years in 2016. [place] 22 years in 2016. 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 6 greatgrandkids.    Finished 10th grade.       Retired from working in [employer] for [employer]             Family History  Problem Relation Age of Onset  . Hypertension Mother   . Ulcers Father   . Cervical cancer Sister   . Throat cancer Brother     . Lung cancer Brother   . Cancer Brother   . Allergic rhinitis Neg Hx   . Angioedema Neg Hx   . Asthma Neg Hx   . Atopy Neg Hx   . Eczema Neg Hx   . Immunodeficiency Neg Hx   . Urticaria Neg Hx     Review of Systems:  As stated in the HPI and otherwise negative.   BP (!) 154/60   Pulse (!) 54   Ht 5' 3.5" (  1.613 m)   Wt 140 lb 6.4 oz (63.7 kg)   LMP  (Approximate) Comment: 1984  BMI 24.48 kg/m   Physical Examination: General: Well developed, well nourished, NAD  HEENT: OP clear, mucus membranes moist  SKIN: warm, dry. No rashes. Neuro: No focal deficits  Musculoskeletal: Muscle strength 5/5 all ext  Psychiatric: Mood and affect normal  Neck: No JVD, no carotid bruits, no thyromegaly, no lymphadenopathy.  Lungs:Clear bilaterally, no wheezes, rhonci, crackles Cardiovascular: Regular rate and rhythm. No murmurs, gallops or rubs. Abdomen:Soft. Bowel sounds present. Non-tender.  Extremities: No lower extremity edema. Pulses are 2 + in the bilateral DP/PT.  Echo 08/02/13: Normal LV function, LVEF=55-60%. Trace TR  EKG:  EKG is ordered today. The ekg ordered today demonstrates Sinus brady, rate 47 bpm. RBBB. T wave abnormality. Unchanged.   Recent Labs: 09/24/2015: TSH 3.13   Lipid Panel    Component Value Date/Time   CHOL 205 (H) 06/22/2014 0940   TRIG 59.0 06/22/2014 0940   HDL 61.80 06/22/2014 0940   CHOLHDL 3 06/22/2014 0940   VLDL 11.8 06/22/2014 0940   LDLCALC 131 (H) 06/22/2014 0940     Wt Readings from Last 3 Encounters:  11/27/15 140 lb 6.4 oz (63.7 kg)  09/24/15 140 lb (63.5 kg)  09/18/15 141 lb 9.6 oz (64.2 kg)     Other studies Reviewed: Additional studies/ records that were reviewed today include: . Review of the above records demonstrates:    Assessment and Plan:   1. PAF/PAC: She is having daily palpitations after exercise. She has had prior MAZE for PAF. Will arrange cardiac monitor to assess.   2. HTN: BP has been controlled at home.  Slightly elevated today but good for her. Will continue current therapy with Avapro, Hydralazine and Chlorthalidone. Continue to follow BP at home.   3. PAD: Stable s/p PTA/stent right popliteal artery by Dr. Gwenlyn Found March 2015. Recent f/u with Dr. Gwenlyn Found April 2017 and she is felt to be stable.   4. Sinus bradycardia: No dizziness. She is on no AV nodal blocking agents.   Current medicines are reviewed at length with the patient today.  The patient does not have concerns regarding medicines.  The following changes have been made:  no change  Labs/ tests ordered today include:   Orders Placed This Encounter  Procedures  . Holter monitor - 48 hour  . EKG 12-Lead    Disposition:   FU with me in 12  months  Signed, Lauree Chandler, MD 11/27/2015 4:47 PM    [facility] [place], [place], [place]  [address] Phone: [phone]; Fax: [phone]

## 2015-11-27 NOTE — Patient Instructions (Signed)
Medication Instructions:  Your physician recommends that you continue on your current medications as directed. Please refer to the Current Medication list given to you today.   Labwork: none  Testing/Procedures: Your physician has recommended that you wear a holter monitor. Holter monitors are medical devices that record the heart's electrical activity. Doctors most often use these monitors to diagnose arrhythmias. Arrhythmias are problems with the speed or rhythm of the heartbeat. The monitor is a small, portable device. You can wear one while you do your normal daily activities. This is usually used to diagnose what is causing palpitations/syncope (passing out).    Follow-Up:  Your physician wants you to follow-up in: 12 months.  You will receive a reminder letter in the mail two months in advance. If you don't receive a letter, please call our office to schedule the follow-up appointment.   Any Other Special Instructions Will Be Listed Below (If Applicable).     If you need a refill on your cardiac medications before your next appointment, please call your pharmacy.

## 2015-12-05 ENCOUNTER — Ambulatory Visit (INDEPENDENT_AMBULATORY_CARE_PROVIDER_SITE_OTHER): Payer: Medicare Other

## 2015-12-05 DIAGNOSIS — I480 Paroxysmal atrial fibrillation: Secondary | ICD-10-CM | POA: Diagnosis not present

## 2015-12-05 DIAGNOSIS — I491 Atrial premature depolarization: Secondary | ICD-10-CM

## 2015-12-31 DIAGNOSIS — H401131 Primary open-angle glaucoma, bilateral, mild stage: Secondary | ICD-10-CM | POA: Diagnosis not present

## 2016-01-06 ENCOUNTER — Other Ambulatory Visit: Payer: Self-pay | Admitting: Family Medicine

## 2016-01-08 ENCOUNTER — Other Ambulatory Visit: Payer: Self-pay | Admitting: Family Medicine

## 2016-03-03 ENCOUNTER — Ambulatory Visit (INDEPENDENT_AMBULATORY_CARE_PROVIDER_SITE_OTHER): Payer: Medicare Other | Admitting: Cardiovascular Disease

## 2016-03-03 ENCOUNTER — Encounter: Payer: Self-pay | Admitting: Cardiovascular Disease

## 2016-03-03 DIAGNOSIS — I10 Essential (primary) hypertension: Secondary | ICD-10-CM | POA: Diagnosis not present

## 2016-03-03 DIAGNOSIS — I480 Paroxysmal atrial fibrillation: Secondary | ICD-10-CM

## 2016-03-03 DIAGNOSIS — I998 Other disorder of circulatory system: Secondary | ICD-10-CM

## 2016-03-03 DIAGNOSIS — I70229 Atherosclerosis of native arteries of extremities with rest pain, unspecified extremity: Secondary | ICD-10-CM

## 2016-03-03 NOTE — Assessment & Plan Note (Addendum)
History of critical limb ischemia status post right popliteal PTA and stenting using an IDEV stent in the setting of critical limb ischemia. This was done March 2015. Her most recent Dopplers performed 05/20/15 revealed a right ABI 0.88 with a high-frequency signal in his right popliteal artery ( 484 cm/s) although she denies claudication. We will continue to follow her by duplex ultrasound.

## 2016-03-03 NOTE — Patient Instructions (Signed)
Medication Instructions: Your physician recommends that you continue on your current medications as directed. Please refer to the Current Medication list given to you today.   Testing/Procedures: Your physician has requested that you have a lower extremity arterial duplex. During this test, ultrasound is used to evaluate arterial blood flow in the legs. Allow one hour for this exam. There are no restrictions or special instructions.  Your physician has requested that you have an ankle brachial index (ABI). During this test an ultrasound and blood pressure cuff are used to evaluate the arteries that supply the arms and legs with blood. Allow thirty minutes for this exam. There are no restrictions or special instructions.  In March 2018.  Follow-Up: Your physician wants you to follow-up in: 1 year with Dr. Gwenlyn Found. You will receive a reminder letter in the mail two months in advance. If you don't receive a letter, please call our office to schedule the follow-up appointment.  If you need a refill on your cardiac medications before your next appointment, please call your pharmacy.

## 2016-03-03 NOTE — Assessment & Plan Note (Signed)
History of hypertension with blood pressure measured 158/62. She is on Avapro, chlorthalidone and hydralazine. Continue current meds at current dosing

## 2016-03-03 NOTE — Assessment & Plan Note (Signed)
History of PAF in the past status post Maze procedure in 2006 with no recurrent A. fib.

## 2016-03-03 NOTE — Progress Notes (Signed)
03/03/2016 [name]   Apr 17, 1925  [id number]  Primary Physician Garret Reddish, MD Primary Cardiologist: Lorretta Harp MD Renae Gloss  HPI:   [name] is a delightful 83 year old married African American female mother of 4 children, grandmother of 7 grandchildren who was referred by Isaias Cowman PA-C at [facility] for critical limb ischemia. I last saw her in the office 06/18/15.[name] Her past medical history is remarkable for paroxysmal atrial fibrillation and hypertension. She has had strokes in the past and is on Plavix. She developed right calf pain in early March and was referred here for further evaluation. Arterial Doppler studies revealed a right ABI of 0.48 with an occluded right popliteal artery. Based on this I angiogrammed her 05/15/13 confirming this and performed PTA and stenting with reconstitution of the popliteal artery with an IDEV stent , With resolution of her claudication symptoms and improvement in her arterial Doppler studies of the right ABI 1.1. Her most recent Dopplers performed 05/20/15 revealed a decline in her ABI to 0.88 on the right increase in her popliteal velocity from 360 up to 484 cm/s. She continues to deny claudication.   Current Outpatient Prescriptions  Medication Sig Dispense Refill  . AVAPRO 150 MG tablet Take 1 tablet (150 mg total) by mouth 2 (two) times daily. 180 tablet 3  . chlorthalidone (HYGROTON) 25 MG tablet take 1 tablet by mouth once daily 30 tablet 5  . clopidogrel (PLAVIX) 75 MG tablet Take 1 tablet (75 mg total) by mouth daily at 12 noon. 90 tablet 3  . COSOPT 22.3-6.8 MG/ML ophthalmic solution Place 1 drop into both eyes 2 (two) times daily. Reported on 06/21/2015  0  . hydrALAZINE (APRESOLINE) 10 MG tablet Take 10 mg by mouth daily.    [name] LUMIGAN 0.01 % SOLN Place 1 drop into both eyes at bedtime.   0  . Omega-3 Fatty Acids (OMEGA 3 PO) Take 5 mLs by mouth daily at 3 pm. One teaspoon daily    . OVER THE COUNTER  MEDICATION Nutra Burst one tablespoon with orange juice daily    . ranitidine (ZANTAC) 150 MG/10ML syrup Take 10 mLs (150 mg total) by mouth 2 (two) times daily. 300 mL 2  . thyroid (ARMOUR) 15 MG tablet Take 1 tablet (15 mg total) by mouth daily. 90 tablet 3   No current facility-administered medications for this visit.     Allergies  Allergen Reactions  . Aceon [Perindopril Erbumine]     cough  . Amlodipine     Lower extremity discomfort  . Aspirin Cough  . Azor [Amlodipine-Olmesartan] Cough  . Beta Adrenergic Blockers     Bradycardia  . Hydralazine Nausea And Vomiting and Other (See Comments)    Weakness   . Vitamin D Analogs Cough    Social History   Social History  . Marital status: Married    Spouse name: N/A  . Number of children: N/A  . Years of education: N/A   Occupational History  . Not on file.   Social History Main Topics  . Smoking status: Never Smoker  . Smokeless tobacco: Never Used  . Alcohol use No  . Drug use: No  . Sexual activity: No   Other Topics Concern  . Not on file   Social History Narrative   Married. Lives with husband. Married 65 years in 2016. 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 6 greatgrandkids.  Finished 10th grade.       Retired from working in [employer] for [employer]              Review of Systems: General: negative for chills, fever, night sweats or weight changes.  Cardiovascular: negative for chest pain, dyspnea on exertion, edema, orthopnea, palpitations, paroxysmal nocturnal dyspnea or shortness of breath Dermatological: negative for rash Respiratory: negative for cough or wheezing Urologic: negative for hematuria Abdominal: negative for nausea, vomiting, diarrhea, bright red blood per rectum, melena, or hematemesis Neurologic: negative for visual changes, syncope, or dizziness All other systems reviewed and are otherwise negative except as noted  above.    Blood pressure (!) 158/62, pulse (!) 52, height 5\' 3"  (1.6 m), weight 140 lb 6.4 oz (63.7 kg).  General appearance: alert and no distress Neck: no adenopathy, no carotid bruit, no JVD, supple, symmetrical, trachea midline and thyroid not enlarged, symmetric, no tenderness/mass/nodules Lungs: clear to auscultation bilaterally Heart: regular rate and rhythm, S1, S2 normal, no murmur, click, rub or gallop Extremities: extremities normal, atraumatic, no cyanosis or edema  EKG sinus bradycardia 52 with right bundle branch block. I personally reviewed this EKG  ASSESSMENT AND PLAN:   PAF (paroxysmal atrial fibrillation) History of PAF in the past status post Maze procedure in 2006 with no recurrent A. fib.  Hypertension History of hypertension with blood pressure measured 158/62. She is on Avapro, chlorthalidone and hydralazine. Continue current meds at current dosing  Critical lower limb ischemia/PAD History of critical limb ischemia status post right popliteal PTA and stenting using an IDEV stent in the setting of critical limb ischemia. This was done March 2015. Her most recent Dopplers performed 05/20/15 revealed a right ABI 0.88 with a high-frequency signal in his right popliteal artery ( 484 cm/s) although she denies claudication. We will continue to follow her by duplex ultrasound.      Lorretta Harp MD FACP,FACC,FAHA, [facility] 03/03/2016 4:10 PM

## 2016-04-13 ENCOUNTER — Ambulatory Visit (INDEPENDENT_AMBULATORY_CARE_PROVIDER_SITE_OTHER): Payer: Medicare Other | Admitting: Family Medicine

## 2016-04-13 ENCOUNTER — Encounter: Payer: Self-pay | Admitting: Family Medicine

## 2016-04-13 VITALS — BP 158/70 | HR 52 | Temp 97.9°F | Ht 63.0 in | Wt 140.0 lb

## 2016-04-13 DIAGNOSIS — J3089 Other allergic rhinitis: Secondary | ICD-10-CM | POA: Diagnosis not present

## 2016-04-13 DIAGNOSIS — R05 Cough: Secondary | ICD-10-CM

## 2016-04-13 DIAGNOSIS — R059 Cough, unspecified: Secondary | ICD-10-CM

## 2016-04-13 MED ORDER — AZELASTINE HCL 0.15 % NA SOLN: NASAL | 5 refills | Status: DC

## 2016-04-13 NOTE — Progress Notes (Signed)
Pre visit review using our clinic review tool, if applicable. No additional management support is needed unless otherwise documented below in the visit note. 

## 2016-04-13 NOTE — Patient Instructions (Signed)
Let's add your nasal spray back into your regimen  Also could increase your zantac syrup to twice a day  Your clopidogrel does NOT have aspirin fortunately   Could also follow up with allergist if not improving in next few weeks to month

## 2016-04-13 NOTE — Progress Notes (Signed)
Subjective:  [name] is a 83 y.o. year old very pleasant female patient who presents for/with See problem oriented charting ROS- no fever, chills, shortness of breath, nausea. Denies classic acid reflux symptoms but does have cough.    Past Medical History-  Patient Active Problem List   Diagnosis Date Noted  . Critical lower limb ischemia/PAD 05/10/2013    Priority: High  . PAF (paroxysmal atrial fibrillation) (HCC)     Priority: High  . Stroke [facility])     Priority: High  . Hypothyroidism 06/07/2014    Priority: Medium  . Hyperlipidemia 06/07/2014    Priority: Medium  . Hypertension     Priority: Medium  . GERD (gastroesophageal reflux disease) 06/25/2014    Priority: Low  . Hyperglycemia 06/25/2014    Priority: Low  . Vitamin D deficiency 06/25/2014    Priority: Low  . Intracervical pessary 06/07/2014    Priority: Low  . Sinus bradycardia 05/19/2013    Priority: Low  . PAC (premature atrial contraction) 05/20/2011    Priority: Low  . Coughing 08/12/2015  . Perennial allergic rhinitis 08/12/2015    Medications- reviewed and updated Current Outpatient Prescriptions  Medication Sig Dispense Refill  . AVAPRO 150 MG tablet Take 1 tablet (150 mg total) by mouth 2 (two) times daily. 180 tablet 3  . chlorthalidone (HYGROTON) 25 MG tablet take 1 tablet by mouth once daily 30 tablet 5  . clopidogrel (PLAVIX) 75 MG tablet Take 1 tablet (75 mg total) by mouth daily at 12 noon. 90 tablet 3  . COSOPT 22.3-6.8 MG/ML ophthalmic solution Place 1 drop into both eyes 2 (two) times daily. Reported on 06/21/2015  0  . hydrALAZINE (APRESOLINE) 10 MG tablet Take 10 mg by mouth daily.    [name] LUMIGAN 0.01 % SOLN Place 1 drop into both eyes at bedtime.   0  . Omega-3 Fatty Acids (OMEGA 3 PO) Take 5 mLs by mouth daily at 3 pm. One teaspoon daily    . OVER THE COUNTER MEDICATION Nutra Burst one tablespoon with orange juice daily    . ranitidine (ZANTAC) 150 MG/10ML syrup Take 10 mLs (150 mg  total) by mouth 2 (two) times daily. 300 mL 2  . thyroid (ARMOUR) 15 MG tablet Take 1 tablet (15 mg total) by mouth daily. 90 tablet 3  . Azelastine HCl 0.15 % SOLN Use 1-2 sprays per nostril 2 times daily as needed 30 mL 5   No current facility-administered medications for this visit.     Objective: BP (!) 158/70 (BP Location: Left Arm, Patient Position: Sitting, Cuff Size: Normal)   Pulse (!) 52   Temp 97.9 F (36.6 C) (Oral)   Ht 5\' 3"  (1.6 m)   Wt 140 lb (63.5 kg)   SpO2 98%   BMI 24.80 kg/m  Gen: NAD, resting comfortably, does not cough in entire visit Mild clear rhinorrhea noted CV: RRR no murmurs rubs or gallops Lungs: CTAB no crackles, wheeze, rhonchi Abdomen: soft/nontender/nondistended Ext: no edema Skin: warm, dry, no rash  Assessment/Plan:  Coughing likely due to GERD and Perennial allergic rhinitis S: over last 2 weeks her cough has resurfaced. Last seen allergist in July last year and at that time was on ranitidine BID and astelin nasal spray with good response. She notes intermittent cough but also some runny nose. At present only taking ranitidine at night and no astelin A/P: She does not know where last bottle of astelin is so sent in new bottle today.  She will restart this as suspect her allergies are playing a role with resurfacing of symptoms. Also GERD may have contributed- encouraged to go back to BID dosing which she agreees to.   Her main concern was the way she read the bottle for clopidogrel she thought there was aspirin in it and she reports aspirin has caused her to cough in the past. Assured her that it was only clopidogrel.   Meds ordered this encounter  Medications  . Azelastine HCl 0.15 % SOLN    Sig: Use 1-2 sprays per nostril 2 times daily as needed    Dispense:  30 mL    Refill:  5    Return precautions advised. Prn with me or with allergist if not improving.  Garret Reddish, MD

## 2016-04-13 NOTE — Assessment & Plan Note (Addendum)
S: over last 2 weeks her cough has resurfaced. Last seen allergist in July last year and at that time was on ranitidine BID and astelin nasal spray with good response. She notes intermittent cough but also some runny nose. At present only taking ranitidine at night and no astelin A/P: She does not know where last bottle of astelin is so sent in new bottle today. She will restart this as suspect her allergies are playing a role with resurfacing of symptoms. Also GERD may have contributed- encouraged to go back to BID dosing which she agreees to.   Her main concern was the way she read the bottle for clopidogrel she thought there was aspirin in it and she reports aspirin has caused her to cough in the past. Assured her that it was only clopidogrel.

## 2016-04-21 DIAGNOSIS — H401131 Primary open-angle glaucoma, bilateral, mild stage: Secondary | ICD-10-CM | POA: Diagnosis not present

## 2016-04-25 ENCOUNTER — Other Ambulatory Visit: Payer: Self-pay | Admitting: Allergy and Immunology

## 2016-04-25 DIAGNOSIS — K219 Gastro-esophageal reflux disease without esophagitis: Secondary | ICD-10-CM

## 2016-05-01 ENCOUNTER — Ambulatory Visit (HOSPITAL_COMMUNITY)
Admission: RE | Admit: 2016-05-01 | Discharge: 2016-05-01 | Disposition: A | Discharge status: Home / Self Care | Payer: Medicare Other | Source: Ambulatory Visit | Attending: Cardiology | Admitting: Cardiology

## 2016-05-01 DIAGNOSIS — Z8673 Personal history of transient ischemic attack (TIA), and cerebral infarction without residual deficits: Secondary | ICD-10-CM | POA: Diagnosis not present

## 2016-05-01 DIAGNOSIS — I2510 Atherosclerotic heart disease of native coronary artery without angina pectoris: Secondary | ICD-10-CM | POA: Diagnosis not present

## 2016-05-01 DIAGNOSIS — E785 Hyperlipidemia, unspecified: Secondary | ICD-10-CM | POA: Diagnosis not present

## 2016-05-01 DIAGNOSIS — I4891 Unspecified atrial fibrillation: Secondary | ICD-10-CM | POA: Insufficient documentation

## 2016-05-01 DIAGNOSIS — I998 Other disorder of circulatory system: Secondary | ICD-10-CM | POA: Diagnosis not present

## 2016-05-01 DIAGNOSIS — Z95828 Presence of other vascular implants and grafts: Secondary | ICD-10-CM | POA: Diagnosis not present

## 2016-05-01 DIAGNOSIS — I10 Essential (primary) hypertension: Secondary | ICD-10-CM | POA: Diagnosis not present

## 2016-05-01 DIAGNOSIS — I70229 Atherosclerosis of native arteries of extremities with rest pain, unspecified extremity: Secondary | ICD-10-CM

## 2016-05-04 ENCOUNTER — Telehealth: Payer: Self-pay | Admitting: Cardiovascular Disease

## 2016-05-04 ENCOUNTER — Other Ambulatory Visit: Payer: Self-pay | Admitting: Cardiovascular Disease

## 2016-05-04 DIAGNOSIS — I70229 Atherosclerosis of native arteries of extremities with rest pain, unspecified extremity: Secondary | ICD-10-CM

## 2016-05-04 DIAGNOSIS — I998 Other disorder of circulatory system: Secondary | ICD-10-CM

## 2016-05-04 NOTE — Telephone Encounter (Signed)
Patient is returning call for results, please call. Thanks.

## 2016-05-04 NOTE — Telephone Encounter (Signed)
VAS [place] LOWER EXTREMITY ARTERIAL DUPLEX  Order: [id number]  Status:  Final result Visible to patient:  No (Not Released) Dx:  Critical lower limb ischemia/PAD  Notes Recorded by Therisa Doyne on 05/04/2016 at 8:57 AM EDT [facility] for results ------  Notes Recorded by Lorretta Harp, MD on 05/03/2016 at 4:10 PM EDT No change from prior study. Repeat in 6 months   Repeat order entered

## 2016-05-05 NOTE — Telephone Encounter (Signed)
Results given to pt. Pt verbalized understanding.

## 2016-06-03 ENCOUNTER — Other Ambulatory Visit: Payer: Self-pay | Admitting: Family Medicine

## 2016-06-03 DIAGNOSIS — I739 Peripheral vascular disease, unspecified: Secondary | ICD-10-CM

## 2016-06-19 ENCOUNTER — Other Ambulatory Visit: Payer: Self-pay | Admitting: Family Medicine

## 2016-06-20 NOTE — Telephone Encounter (Signed)
avapro = irbesartan. May refill.
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
## 2016-06-25 ENCOUNTER — Ambulatory Visit (INDEPENDENT_AMBULATORY_CARE_PROVIDER_SITE_OTHER): Payer: Medicare Other

## 2016-06-25 VITALS — BP 138/76 | HR 47 | Ht 64.3 in | Wt 140.0 lb

## 2016-06-25 DIAGNOSIS — Z0000 Encounter for general adult medical examination without abnormal findings: Secondary | ICD-10-CM

## 2016-06-25 NOTE — Patient Instructions (Addendum)
[name] , Thank you for taking time to come for your Medicare Wellness Visit. I appreciate your ongoing commitment to your health goals. Please review the following plan we discussed and let me know if I can assist you in the future.   A Tetanus is recommended every 10 years. Medicare covers a tetanus if you have a cut or wound; otherwise, there may be a charge. If you had not had a tetanus with pertusses, known as the Tdap, you can take this anytime.   Shingrix is a vaccine for the prevention of Shingles in Adults 50 and older.  If you are on Medicare, you can request a prescription from your doctor to be filled at a pharmacy.  Please check with your benefits regarding applicable copays or out of pocket expenses.  The Shingrix is given in 2 vaccines approx 8 weeks apart. You must receive the 2nd dose prior to 6 months from receipt of the first.   Will schedule your mammogram   These are the goals we discussed: Goals    . Exercise 150 minutes per week (moderate activity)          Will check out the senior center  [address], [place], [place] [address] Phone: [phone]  [facility] [place], [place] [address] Directions  [phone]        This is a list of the screening recommended for you and due dates:  Health Maintenance  Topic Date Due  . Tetanus Vaccine  01/02/1945  . Pneumonia vaccines (1 of 2 - PCV13) 01/01/1993  . Flu Shot  09/23/2016  . DEXA scan (bone density measurement)  Completed    Prevention of falls: Remove rugs or any tripping hazards in the home Use Non slip mats in bathtubs and showers Placing grab bars next to the toilet and or shower Placing handrails on both sides of the stair way Adding extra lighting in the home.   Personal safety issues reviewed:  1. Consider starting a community watch program per [facility] 2.  Changes batteries is smoke detector and/or carbon monoxide detector  3.  If you have  firearms; keep them in a safe place 4.  Wear protection when in the sun; Always wear sunscreen or a hat; It is good to have your doctor check your skin annually or review any new areas of concern 5. Driving safety; Keep in the right lane; stay 3 car lengths behind the car in front of you on the highway; look 3 times prior to pulling out; carry your cell phone everywhere you go!    Learn about the Yellow Dot program:  The program allows first responders at your emergency to have access to who your physician is, as well as your medications and medical conditions.  Citizens requesting the Yellow Dot Packages should contact Master Corporal Nunzio Cobbs at the [facility] [phone] for the first week of the program and beginning the week after Easter citizens should contact their Scientist, physiological.        Fall Prevention in the Home Falls can cause injuries. They can happen to people of all ages. There are many things you can do to make your home safe and to help prevent falls. What can I do on the outside of my home?  Regularly fix the edges of walkways and driveways and fix any cracks.  Remove anything that might make you trip as you walk through a door, such as  a raised step or threshold.  Trim any bushes or trees on the path to your home.  Use bright outdoor lighting.  Clear any walking paths of anything that might make someone trip, such as rocks or tools.  Regularly check to see if handrails are loose or broken. Make sure that both sides of any steps have handrails.  Any raised decks and porches should have guardrails on the edges.  Have any leaves, snow, or ice cleared regularly.  Use sand or salt on walking paths during winter.  Clean up any spills in your garage right away. This includes oil or grease spills. What can I do in the bathroom?  Use night lights.  Install grab bars by the toilet and in the tub and shower. Do not use towel  bars as grab bars.  Use non-skid mats or decals in the tub or shower.  If you need to sit down in the shower, use a plastic, non-slip stool.  Keep the floor dry. Clean up any water that spills on the floor as soon as it happens.  Remove soap buildup in the tub or shower regularly.  Attach bath mats securely with double-sided non-slip rug tape.  Do not have throw rugs and other things on the floor that can make you trip. What can I do in the bedroom?  Use night lights.  Make sure that you have a light by your bed that is easy to reach.  Do not use any sheets or blankets that are too big for your bed. They should not hang down onto the floor.  Have a firm chair that has side arms. You can use this for support while you get dressed.  Do not have throw rugs and other things on the floor that can make you trip. What can I do in the kitchen?  Clean up any spills right away.  Avoid walking on wet floors.  Keep items that you use a lot in easy-to-reach places.  If you need to reach something above you, use a strong step stool that has a grab bar.  Keep electrical cords out of the way.  Do not use floor polish or wax that makes floors slippery. If you must use wax, use non-skid floor wax.  Do not have throw rugs and other things on the floor that can make you trip. What can I do with my stairs?  Do not leave any items on the stairs.  Make sure that there are handrails on both sides of the stairs and use them. Fix handrails that are broken or loose. Make sure that handrails are as long as the stairways.  Check any carpeting to make sure that it is firmly attached to the stairs. Fix any carpet that is loose or worn.  Avoid having throw rugs at the top or bottom of the stairs. If you do have throw rugs, attach them to the floor with carpet tape.  Make sure that you have a light switch at the top of the stairs and the bottom of the stairs. If you do not have them, ask someone to  add them for you. What else can I do to help prevent falls?  Wear shoes that:  Do not have high heels.  Have rubber bottoms.  Are comfortable and fit you well.  Are closed at the toe. Do not wear sandals.  If you use a stepladder:  Make sure that it is fully opened. Do not climb a closed stepladder.  Make  sure that both sides of the stepladder are locked into place.  Ask someone to hold it for you, if possible.  Clearly mark and make sure that you can see:  Any grab bars or handrails.  First and last steps.  Where the edge of each step is.  Use tools that help you move around (mobility aids) if they are needed. These include:  Canes.  Walkers.  Scooters.  Crutches.  Turn on the lights when you go into a dark area. Replace any light bulbs as soon as they burn out.  Set up your furniture so you have a clear path. Avoid moving your furniture around.  If any of your floors are uneven, fix them.  If there are any pets around you, be aware of where they are.  Review your medicines with your doctor. Some medicines can make you feel dizzy. This can increase your chance of falling. Ask your doctor what other things that you can do to help prevent falls. This information is not intended to replace advice given to you by your health care provider. Make sure you discuss any questions you have with your health care provider. Document Released: 12/06/2008 Document Revised: 07/18/2015 Document Reviewed: 03/16/2014 Elsevier Interactive Patient Education  2017 [place] Maintenance, Female Adopting a healthy lifestyle and getting preventive care can go a long way to promote health and wellness. Talk with your health care provider about what schedule of regular examinations is right for you. This is a good chance for you to check in with your provider about disease prevention and staying healthy. In between checkups, there are plenty of things you can do on your own.  Experts have done a lot of research about which lifestyle changes and preventive measures are most likely to keep you healthy. Ask your health care provider for more information. Weight and diet Eat a healthy diet  Be sure to include plenty of vegetables, fruits, low-fat dairy products, and lean protein.  Do not eat a lot of foods high in solid fats, added sugars, or salt.  Get regular exercise. This is one of the most important things you can do for your health.  Most adults should exercise for at least 150 minutes each week. The exercise should increase your heart rate and make you sweat (moderate-intensity exercise).  Most adults should also do strengthening exercises at least twice a week. This is in addition to the moderate-intensity exercise. Maintain a healthy weight  Body mass index (BMI) is a measurement that can be used to identify possible weight problems. It estimates body fat based on height and weight. Your health care provider can help determine your BMI and help you achieve or maintain a healthy weight.  For females 65 years of age and older:  A BMI below 18.5 is considered underweight.  A BMI of 18.5 to 24.9 is normal.  A BMI of 25 to 29.9 is considered overweight.  A BMI of 30 and above is considered obese. Watch levels of cholesterol and blood lipids  You should start having your blood tested for lipids and cholesterol at 81 years of age, then have this test every 5 years.  You may need to have your cholesterol levels checked more often if:  Your lipid or cholesterol levels are high.  You are older than 81 years of age.  You are at high risk for heart disease. Cancer screening Lung Cancer  Lung cancer screening is recommended for adults 55-75 years old who  are at high risk for lung cancer because of a history of smoking.  A yearly low-dose CT scan of the lungs is recommended for people who:  Currently smoke.  Have quit within the past 15 years.  Have  at least a 30-pack-year history of smoking. A pack year is smoking an average of one pack of cigarettes a day for 1 year.  Yearly screening should continue until it has been 15 years since you quit.  Yearly screening should stop if you develop a health problem that would prevent you from having lung cancer treatment. Breast Cancer  Practice breast self-awareness. This means understanding how your breasts normally appear and feel.  It also means doing regular breast self-exams. Let your health care provider know about any changes, no matter how small.  If you are in your 20s or 30s, you should have a clinical breast exam (CBE) by a health care provider every 1-3 years as part of a regular health exam.  If you are 44 or older, have a CBE every year. Also consider having a breast X-ray (mammogram) every year.  If you have a family history of breast cancer, talk to your health care provider about genetic screening.  If you are at high risk for breast cancer, talk to your health care provider about having an MRI and a mammogram every year.  Breast cancer gene (BRCA) assessment is recommended for women who have family members with BRCA-related cancers. BRCA-related cancers include:  Breast.  Ovarian.  Tubal.  Peritoneal cancers.  Results of the assessment will determine the need for genetic counseling and BRCA1 and BRCA2 testing. Cervical Cancer  Your health care provider may recommend that you be screened regularly for cancer of the pelvic organs (ovaries, uterus, and vagina). This screening involves a pelvic examination, including checking for microscopic changes to the surface of your cervix (Pap test). You may be encouraged to have this screening done every 3 years, beginning at age 60.  For women ages 74-65, health care providers may recommend pelvic exams and Pap testing every 3 years, or they may recommend the Pap and pelvic exam, combined with testing for human papilloma virus  (HPV), every 5 years. Some types of HPV increase your risk of cervical cancer. Testing for HPV may also be done on women of any age with unclear Pap test results.  Other health care providers may not recommend any screening for nonpregnant women who are considered low risk for pelvic cancer and who do not have symptoms. Ask your health care provider if a screening pelvic exam is right for you.  If you have had past treatment for cervical cancer or a condition that could lead to cancer, you need Pap tests and screening for cancer for at least 20 years after your treatment. If Pap tests have been discontinued, your risk factors (such as having a new sexual partner) need to be reassessed to determine if screening should resume. Some women have medical problems that increase the chance of getting cervical cancer. In these cases, your health care provider may recommend more frequent screening and Pap tests. Colorectal Cancer  This type of cancer can be detected and often prevented.  Routine colorectal cancer screening usually begins at 81 years of age and continues through 81 years of age.  Your health care provider may recommend screening at an earlier age if you have risk factors for colon cancer.  Your health care provider may also recommend using home test kits to check for  hidden blood in the stool.  A small camera at the end of a tube can be used to examine your colon directly (sigmoidoscopy or colonoscopy). This is done to check for the earliest forms of colorectal cancer.  Routine screening usually begins at age 8.  Direct examination of the colon should be repeated every 5-10 years through 81 years of age. However, you may need to be screened more often if early forms of precancerous polyps or small growths are found. Skin Cancer  Check your skin from head to toe regularly.  Tell your health care provider about any new moles or changes in moles, especially if there is a change in a  mole's shape or color.  Also tell your health care provider if you have a mole that is larger than the size of a pencil eraser.  Always use sunscreen. Apply sunscreen liberally and repeatedly throughout the day.  Protect yourself by wearing long sleeves, pants, a wide-brimmed hat, and sunglasses whenever you are outside. Heart disease, diabetes, and high blood pressure  High blood pressure causes heart disease and increases the risk of stroke. High blood pressure is more likely to develop in:  People who have blood pressure in the high end of the normal range (130-139/85-89 mm Hg).  People who are overweight or obese.  People who are African American.  If you are 44-33 years of age, have your blood pressure checked every 3-5 years. If you are 33 years of age or older, have your blood pressure checked every year. You should have your blood pressure measured twice-once when you are at a hospital or clinic, and once when you are not at a hospital or clinic. Record the average of the two measurements. To check your blood pressure when you are not at a hospital or clinic, you can use:  An automated blood pressure machine at a pharmacy.  A home blood pressure monitor.  If you are between 57 years and 59 years old, ask your health care provider if you should take aspirin to prevent strokes.  Have regular diabetes screenings. This involves taking a blood sample to check your fasting blood sugar level.  If you are at a normal weight and have a low risk for diabetes, have this test once every three years after 81 years of age.  If you are overweight and have a high risk for diabetes, consider being tested at a younger age or more often. Preventing infection Hepatitis B  If you have a higher risk for hepatitis B, you should be screened for this virus. You are considered at high risk for hepatitis B if:  You were born in a country where hepatitis B is common. Ask your health care provider  which countries are considered high risk.  Your parents were born in a high-risk country, and you have not been immunized against hepatitis B (hepatitis B vaccine).  You have HIV or AIDS.  You use needles to inject street drugs.  You live with someone who has hepatitis B.  You have had sex with someone who has hepatitis B.  You get hemodialysis treatment.  You take certain medicines for conditions, including cancer, organ transplantation, and autoimmune conditions. Hepatitis C  Blood testing is recommended for:  Everyone born from [birth year] through 1963.  Anyone with known risk factors for hepatitis C. Sexually transmitted infections (STIs)  You should be screened for sexually transmitted infections (STIs) including gonorrhea and chlamydia if:  You are sexually active and are younger  than 81 years of age.  You are older than 81 years of age and your health care provider tells you that you are at risk for this type of infection.  Your sexual activity has changed since you were last screened and you are at an increased risk for chlamydia or gonorrhea. Ask your health care provider if you are at risk.  If you do not have HIV, but are at risk, it may be recommended that you take a prescription medicine daily to prevent HIV infection. This is called pre-exposure prophylaxis (PrEP). You are considered at risk if:  You are sexually active and do not regularly use condoms or know the HIV status of your partner(s).  You take drugs by injection.  You are sexually active with a partner who has HIV. Talk with your health care provider about whether you are at high risk of being infected with HIV. If you choose to begin PrEP, you should first be tested for HIV. You should then be tested every 3 months for as long as you are taking PrEP. Pregnancy  If you are premenopausal and you may become pregnant, ask your health care provider about preconception counseling.  If you may become pregnant,  take 400 to 800 micrograms (mcg) of folic acid every day.  If you want to prevent pregnancy, talk to your health care provider about birth control (contraception). Osteoporosis and menopause  Osteoporosis is a disease in which the bones lose minerals and strength with aging. This can result in serious bone fractures. Your risk for osteoporosis can be identified using a bone density scan.  If you are 108 years of age or older, or if you are at risk for osteoporosis and fractures, ask your health care provider if you should be screened.  Ask your health care provider whether you should take a calcium or vitamin D supplement to lower your risk for osteoporosis.  Menopause may have certain physical symptoms and risks.  Hormone replacement therapy may reduce some of these symptoms and risks. Talk to your health care provider about whether hormone replacement therapy is right for you. Follow these instructions at home:  Schedule regular health, dental, and eye exams.  Stay current with your immunizations.  Do not use any tobacco products including cigarettes, chewing tobacco, or electronic cigarettes.  If you are pregnant, do not drink alcohol.  If you are breastfeeding, limit how much and how often you drink alcohol.  Limit alcohol intake to no more than 1 drink per day for nonpregnant women. One drink equals 12 ounces of beer, 5 ounces of wine, or 1 ounces of hard liquor.  Do not use street drugs.  Do not share needles.  Ask your health care provider for help if you need support or information about quitting drugs.  Tell your health care provider if you often feel depressed.  Tell your health care provider if you have ever been abused or do not feel safe at home. This information is not intended to replace advice given to you by your health care provider. Make sure you discuss any questions you have with your health care provider. Document Released: 08/25/2010 Document Revised:  07/18/2015 Document Reviewed: 11/13/2014 Elsevier Interactive Patient Education  2017 [place] A mammogram is an X-ray of the breasts that is done to check for abnormal changes. This procedure can screen for and detect any changes that may suggest breast cancer. A mammogram can also identify other changes and variations in the  breast, such as:  Inflammation of the breast tissue (mastitis).  An infected area that contains a collection of pus (abscess).  A fluid-filled sac (cyst).  Fibrocystic changes. This is when breast tissue becomes denser, which can make the tissue feel rope-like or uneven under the skin.  Tumors that are not cancerous (benign). Tell a health care provider about:  Any allergies you have.  If you have breast implants.  If you have had previous breast disease, biopsy, or surgery.  If you are breastfeeding.  Any possibility that you could be pregnant, if this applies.  If you are younger than age 7.  If you have a family history of breast cancer. What are the risks? Generally, this is a safe procedure. However, problems may occur, including:  Exposure to radiation. Radiation levels are very low with this test.  The results being misinterpreted.  The need for further tests.  The inability of the mammogram to detect certain cancers. What happens before the procedure?  Schedule your test about 1-2 weeks after your menstrual period. This is usually when your breasts are the least tender.  If you have had a mammogram done at a different facility in the past, get the mammogram X-rays or have them sent to your current exam facility in order to compare them.  Wash your breasts and under your arms the day of the test.  Do not wear deodorants, perfumes, lotions, or powders anywhere on your body on the day of the test.  Remove any jewelry from your neck.  Wear clothes that you can change into and out of easily. What happens during the  procedure?  You will undress from the waist up and put on a gown.  You will stand in front of the X-ray machine.  Each breast will be placed between two plastic or glass plates. The plates will compress your breast for a few seconds. Try to stay as relaxed as possible during the procedure. This does not cause any harm to your breasts and any discomfort you feel will be very brief.  X-rays will be taken from different angles of each breast. The procedure may vary among health care providers and hospitals. What happens after the procedure?  The mammogram will be examined by a specialist (radiologist).  You may need to repeat certain parts of the test, depending on the quality of the images. This is commonly done if the radiologist needs a better view of the breast tissue.  Ask when your test results will be ready. Make sure you get your test results.  You may resume your normal activities. This information is not intended to replace advice given to you by your health care provider. Make sure you discuss any questions you have with your health care provider. Document Released: 02/07/2000 Document Revised: 07/15/2015 Document Reviewed: 04/20/2014 Elsevier Interactive Patient Education  2017 [employer].

## 2016-06-25 NOTE — Progress Notes (Signed)
I have reviewed and agree with note, evaluation, plan.   Stephen Hunter, MD  

## 2016-09-28 DIAGNOSIS — H401131 Primary open-angle glaucoma, bilateral, mild stage: Secondary | ICD-10-CM | POA: Diagnosis not present

## 2016-10-06 ENCOUNTER — Other Ambulatory Visit: Payer: Self-pay | Admitting: Family Medicine

## 2016-11-02 ENCOUNTER — Encounter: Payer: Self-pay | Admitting: Family Medicine

## 2016-11-02 ENCOUNTER — Ambulatory Visit (INDEPENDENT_AMBULATORY_CARE_PROVIDER_SITE_OTHER): Payer: Medicare Other | Admitting: Family Medicine

## 2016-11-02 VITALS — BP 158/72 | HR 64 | Temp 99.6°F | Resp 16 | Wt 138.6 lb

## 2016-11-02 DIAGNOSIS — I10 Essential (primary) hypertension: Secondary | ICD-10-CM

## 2016-11-02 DIAGNOSIS — M79604 Pain in right leg: Secondary | ICD-10-CM

## 2016-11-02 DIAGNOSIS — E039 Hypothyroidism, unspecified: Secondary | ICD-10-CM

## 2016-11-02 DIAGNOSIS — R739 Hyperglycemia, unspecified: Secondary | ICD-10-CM

## 2016-11-02 DIAGNOSIS — E785 Hyperlipidemia, unspecified: Secondary | ICD-10-CM

## 2016-11-02 NOTE — Patient Instructions (Signed)
I dont see anything dangerous on exam or in your history  Lets make sure to get that study done for the leg- our nursing staff team is looking into this to see if we can get it scheduled  Glad blood pressure looks good at home- coming down in the office since you just took your pills

## 2016-11-02 NOTE — Assessment & Plan Note (Signed)
S: controlled at home on irbesartan 150mg  BID, hydralazine 10mg  daily, chlorthalidone 25mg . REports home #s about 130/70 or lower BP Readings from Last 3 Encounters:  11/02/16 (!) 158/72  06/25/16 138/76  04/13/16 (!) 158/70  A/P: We discussed blood pressure goal of <140/90. Continue current meds: controlled at home- just took meds before coming in today so likely will trend down further today

## 2016-11-02 NOTE — Assessment & Plan Note (Signed)
Statin intolerance in the past. Myalgias on several meds. 2016 we tried once weekly atorvastatin and complained of cough. Recheck and potentially trial med like pravastatin or livalo given PAD

## 2016-11-02 NOTE — Assessment & Plan Note (Signed)
Update a1c with labs. Has been as high as 6.1 in the past

## 2016-11-02 NOTE — Progress Notes (Signed)
Subjective:  [name] is a 83 y.o. year old very pleasant female patient who presents for/with See problem oriented charting ROS- No chest pain or shortness of breath. No headache or blurry vision. Has some leg pain but none with activity.    Past Medical History-  Patient Active Problem List   Diagnosis Date Noted  . Critical lower limb ischemia/PAD 05/10/2013    Priority: High  . PAF (paroxysmal atrial fibrillation) (HCC)     Priority: High  . Stroke [facility])     Priority: High  . Hypothyroidism 06/07/2014    Priority: Medium  . Hyperlipidemia 06/07/2014    Priority: Medium  . Hypertension     Priority: Medium  . GERD (gastroesophageal reflux disease) 06/25/2014    Priority: Low  . Hyperglycemia 06/25/2014    Priority: Low  . Vitamin D deficiency 06/25/2014    Priority: Low  . Intracervical pessary 06/07/2014    Priority: Low  . Sinus bradycardia 05/19/2013    Priority: Low  . PAC (premature atrial contraction) 05/20/2011    Priority: Low  . Coughing 08/12/2015  . Perennial allergic rhinitis 08/12/2015    Medications- reviewed and updated Current Outpatient Prescriptions  Medication Sig Dispense Refill  . ARMOUR THYROID 15 MG tablet TAKE 1 TABLET BY MOUTH EVERY DAY 90 tablet 1  . AVAPRO 150 MG tablet Take 1 tablet (150 mg total) by mouth 2 (two) times daily. 180 tablet 3  . Azelastine HCl 0.15 % SOLN Use 1-2 sprays per nostril 2 times daily as needed 30 mL 5  . chlorthalidone (HYGROTON) 25 MG tablet take 1 tablet by mouth once daily 30 tablet 5  . clopidogrel (PLAVIX) 75 MG tablet TAKE 1 TABLET BY MOUTH DAILY AT 12:00 NOON 90 tablet 3  . COSOPT 22.3-6.8 MG/ML ophthalmic solution Place 1 drop into both eyes 2 (two) times daily. Reported on 06/21/2015  0  . hydrALAZINE (APRESOLINE) 10 MG tablet Take 10 mg by mouth daily.    . irbesartan (AVAPRO) 150 MG tablet TAKE 1 TABLET BY MOUTH 2 TIMES DAILY 180 tablet 3  . LUMIGAN 0.01 % SOLN Place 1 drop into both eyes at  bedtime.   0  . Omega-3 Fatty Acids (OMEGA 3 PO) Take 5 mLs by mouth daily at 3 pm. One teaspoon daily    . OVER THE COUNTER MEDICATION Nutra Burst one tablespoon with orange juice daily    . ranitidine (ZANTAC) 75 MG/5ML syrup take 10 milliliters by mouth twice a day 300 mL 3   No current facility-administered medications for this visit.     Objective: BP (!) 158/72   Pulse 64   Temp 99.6 F (37.6 C) (Oral)   Resp 16   Wt 138 lb 9.6 oz (62.9 kg)   SpO2 94%   BMI 23.57 kg/m  Gen: NAD, resting comfortably CV: RRR no murmurs rubs or gallops Lungs: CTAB no crackles, wheeze, rhonchi Ext: no edema Skin: warm, dry Neuro: intact distal sensation in legs  Assessment/Plan:  Pain in right leg S: 2-3 weeks of warmth sensation in back of right hamstring area. States it feels like a hot spot "like hot blood is running inside" Lasts for about 6-7 seconds then goes away. Somedays not at all- other days could be 3-4 times in a day. Can happen at rest. Doesn't happen with activity- only happens with sitting. She wonders if it could be from her plavix- she often attributes physical symptoms to meds she is on.  No fever or chills- temperature slightly higher than baseline today. States mild anxiety today.  A/P: Patient was supposed to have vascular studies done completed 6 months from march visit- though I do not think current issues are vascular in nature- this alleviated patient concerns. At end of day- did not see this was scheduled yet so will send message to nursing staff for aid on this. Not exertional which drastically reduces my concern.   I am unclear of the cause for this- could be neurogenic but pain is infrequent and short lived so worth monitoring only. No rash to suggest shingles.   Hypertension S: controlled at home on irbesartan 150mg  BID, hydralazine 10mg  daily, chlorthalidone 25mg . REports home #s about 130/70 or lower BP Readings from Last 3 Encounters:  11/02/16 (!) 158/72   06/25/16 138/76  04/13/16 (!) 158/70  A/P: We discussed blood pressure goal of <140/90. Continue current meds: controlled at home- just took meds before coming in today so likely will trend down further today    Patient Instructions  I dont see anything dangerous on exam or in your history  Lets make sure to get that study done for the leg- our nursing staff team is looking into this to see if we can get it scheduled  Glad blood pressure looks good at home- coming down in the office since you just took your pills    Hypertension S: controlled at home on irbesartan 150mg  BID, hydralazine 10mg  daily, chlorthalidone 25mg . REports home #s about 130/70 or lower BP Readings from Last 3 Encounters:  11/02/16 (!) 158/72  06/25/16 138/76  04/13/16 (!) 158/70  A/P: We discussed blood pressure goal of <140/90. Continue current meds: controlled at home- just took meds before coming in today so likely will trend down further today   Also needs future fasting labs as over 2 years since full labs Future Appointments Date Time Provider [place]  11/05/2016 9:00 AM [facility]   Orders Placed This Encounter  Procedures  . Hemoglobin A1c    [place]    Standing Status:   Future    Standing Expiration Date:   11/02/2017  . CBC    Standing Status:   Future    Standing Expiration Date:   11/02/2017  . Comprehensive metabolic panel    [place]    Standing Status:   Future    Standing Expiration Date:   11/02/2017  . Lipid panel    Standing Status:   Future    Standing Expiration Date:   11/02/2017  . TSH    Standing Status:   Future    Standing Expiration Date:   11/02/2017   Return precautions advised.  Garret Reddish, MD

## 2016-11-05 ENCOUNTER — Other Ambulatory Visit: Payer: Medicare Other

## 2016-11-11 ENCOUNTER — Other Ambulatory Visit: Payer: Self-pay | Admitting: Family Medicine

## 2016-11-12 ENCOUNTER — Other Ambulatory Visit (INDEPENDENT_AMBULATORY_CARE_PROVIDER_SITE_OTHER): Payer: Medicare Other

## 2016-11-12 DIAGNOSIS — R739 Hyperglycemia, unspecified: Secondary | ICD-10-CM | POA: Diagnosis not present

## 2016-11-12 DIAGNOSIS — I10 Essential (primary) hypertension: Secondary | ICD-10-CM

## 2016-11-12 DIAGNOSIS — E785 Hyperlipidemia, unspecified: Secondary | ICD-10-CM | POA: Diagnosis not present

## 2016-11-12 DIAGNOSIS — E039 Hypothyroidism, unspecified: Secondary | ICD-10-CM

## 2016-11-12 LAB — LIPID PANEL
CHOLESTEROL: 224 mg/dL — AB (ref 0–200)
HDL: 70 mg/dL (ref >39.00)
LDL Cholesterol: 140 mg/dL — ABNORMAL HIGH (ref 0–99)
NonHDL: 153.97
TRIGLYCERIDES: 71 mg/dL (ref 0.0–149.0)
Total CHOL/HDL Ratio: 3
VLDL: 14.2 mg/dL (ref 0.0–40.0)

## 2016-11-12 LAB — CBC
HCT: 34.8 % — ABNORMAL LOW (ref 36.0–46.0)
HEMOGLOBIN: 11.2 g/dL — AB (ref 12.0–15.0)
MCHC: 32.3 g/dL (ref 30.0–36.0)
MCV: 83.6 fl (ref 78.0–100.0)
PLATELETS: 166 10*3/uL (ref 150.0–400.0)
RBC: 4.16 Mil/uL (ref 3.87–5.11)
RDW: 14.8 % (ref 11.5–15.5)
WBC: 3.3 10*3/uL — AB (ref 4.0–10.5)

## 2016-11-12 LAB — COMPREHENSIVE METABOLIC PANEL
ALBUMIN: 3.9 g/dL (ref 3.5–5.2)
ALK PHOS: 58 U/L (ref 39–117)
ALT: 8 U/L (ref 0–35)
AST: 13 U/L (ref 0–37)
BUN: 30 mg/dL — AB (ref 6–23)
CALCIUM: 9.1 mg/dL (ref 8.4–10.5)
CO2: 28 mEq/L (ref 19–32)
Chloride: 107 mEq/L (ref 96–112)
Creatinine, Ser: 1.24 mg/dL — ABNORMAL HIGH (ref 0.40–1.20)
GFR: 52.4 mL/min — AB (ref >60.00)
Glucose, Bld: 96 mg/dL (ref 70–99)
POTASSIUM: 3.9 meq/L (ref 3.5–5.1)
SODIUM: 141 meq/L (ref 135–145)
TOTAL PROTEIN: 6.5 g/dL (ref 6.0–8.3)
Total Bilirubin: 0.4 mg/dL (ref 0.2–1.2)

## 2016-11-12 LAB — TSH: TSH: 4.77 u[IU]/mL — AB (ref 0.35–4.50)

## 2016-11-12 LAB — HEMOGLOBIN A1C: Hgb A1c MFr Bld: 6.2 % (ref 4.6–6.5)

## 2016-11-13 ENCOUNTER — Ambulatory Visit (HOSPITAL_COMMUNITY)
Admission: RE | Admit: 2016-11-13 | Discharge: 2016-11-13 | Disposition: A | Discharge status: Home / Self Care | Payer: Medicare Other | Source: Ambulatory Visit | Attending: Cardiovascular Disease | Admitting: Cardiovascular Disease

## 2016-11-13 DIAGNOSIS — I998 Other disorder of circulatory system: Secondary | ICD-10-CM | POA: Diagnosis not present

## 2016-11-13 DIAGNOSIS — I70229 Atherosclerosis of native arteries of extremities with rest pain, unspecified extremity: Secondary | ICD-10-CM

## 2016-11-13 DIAGNOSIS — I739 Peripheral vascular disease, unspecified: Secondary | ICD-10-CM | POA: Diagnosis not present

## 2016-11-13 DIAGNOSIS — I771 Stricture of artery: Secondary | ICD-10-CM | POA: Diagnosis not present

## 2016-11-13 DIAGNOSIS — Z95828 Presence of other vascular implants and grafts: Secondary | ICD-10-CM | POA: Insufficient documentation

## 2016-11-16 ENCOUNTER — Other Ambulatory Visit: Payer: Self-pay | Admitting: Cardiovascular Disease

## 2016-11-16 DIAGNOSIS — I998 Other disorder of circulatory system: Secondary | ICD-10-CM

## 2016-11-16 DIAGNOSIS — I70229 Atherosclerosis of native arteries of extremities with rest pain, unspecified extremity: Secondary | ICD-10-CM

## 2016-11-20 ENCOUNTER — Telehealth: Payer: Self-pay | Admitting: Family Medicine

## 2016-11-20 NOTE — Telephone Encounter (Signed)
Called and left a voicemail message asking for a return phone call 

## 2016-11-20 NOTE — Telephone Encounter (Signed)
[name] pt returned your call. °

## 2016-11-23 ENCOUNTER — Other Ambulatory Visit: Payer: Self-pay

## 2016-11-23 DIAGNOSIS — E039 Hypothyroidism, unspecified: Secondary | ICD-10-CM

## 2016-11-30 ENCOUNTER — Other Ambulatory Visit: Payer: Self-pay | Admitting: Family Medicine

## 2017-01-04 ENCOUNTER — Other Ambulatory Visit (INDEPENDENT_AMBULATORY_CARE_PROVIDER_SITE_OTHER): Payer: Medicare Other

## 2017-01-04 DIAGNOSIS — E039 Hypothyroidism, unspecified: Secondary | ICD-10-CM | POA: Diagnosis not present

## 2017-01-04 LAB — TSH: TSH: 3.47 u[IU]/mL (ref 0.35–4.50)

## 2017-01-05 ENCOUNTER — Other Ambulatory Visit: Payer: Self-pay | Admitting: Family Medicine

## 2017-01-05 ENCOUNTER — Other Ambulatory Visit: Payer: Medicare Other

## 2017-01-05 DIAGNOSIS — Z1231 Encounter for screening mammogram for malignant neoplasm of breast: Secondary | ICD-10-CM

## 2017-01-08 ENCOUNTER — Encounter: Payer: Self-pay | Admitting: Cardiovascular Disease

## 2017-01-08 ENCOUNTER — Ambulatory Visit (INDEPENDENT_AMBULATORY_CARE_PROVIDER_SITE_OTHER): Payer: Medicare Other | Admitting: Cardiovascular Disease

## 2017-01-08 ENCOUNTER — Encounter (INDEPENDENT_AMBULATORY_CARE_PROVIDER_SITE_OTHER): Payer: Self-pay

## 2017-01-08 VITALS — BP 152/70 | HR 52 | Ht 63.0 in | Wt 140.4 lb

## 2017-01-08 DIAGNOSIS — I10 Essential (primary) hypertension: Secondary | ICD-10-CM

## 2017-01-08 DIAGNOSIS — I739 Peripheral vascular disease, unspecified: Secondary | ICD-10-CM

## 2017-01-08 DIAGNOSIS — I480 Paroxysmal atrial fibrillation: Secondary | ICD-10-CM | POA: Diagnosis not present

## 2017-01-08 NOTE — Progress Notes (Signed)
Chief Complaint  Patient presents with  . Follow-up    HTN   History of Present Illness: 83 yo female with history of HTN, CVA, PAF s/p MAZE 2006, PAD who is here today for cardiac follow up. I have followed her for atrial fibrillation and HTN. Her HTN has been difficult to control over the years. She has not tolerated many medications. She has most recently tolerated Avapro, chlorthalidone and hydralazine well. She has not tolerated Norvasc, Coreg, Bystolic or clonidine. I met her at [facility] in February 2013 when she was admitted with a headache and found to have sinus brady with elevated BP. She had initially been tried on Bystolic but did not tolerate due to bradycardia. She was started on Norvasc in the hospital but had burning in her legs so she stopped it. Echo 04/02/11 with normal LV size and function. Event monitor in 2013 showed NSR with PACs. No evidence of atrial fib. Renal artery dopplers November 2013 without evidence of renal artery stenosis. She has PAD followed by Dr. Gwenlyn Found and underwent successful balloon/stenting of an occluded right popliteal artery per Dr. Gwenlyn Found in 2015. Her PAD was stable at last check with Dr. Gwenlyn Found in January 2018. Echo June 2015 at [facility] with normal LV function. Cardiac monitor October 2017 with PVCs and PACs, no atrial fib.   She is here today for follow up. The patient denies any chest pain, dyspnea, palpitations, lower extremity edema, orthopnea, PND, dizziness, near syncope or syncope.     Primary Care Physician:  [name], MD  Past Medical History:  Diagnosis Date  . Arthritis   . Chicken pox   . Glaucoma of both eyes   . High cholesterol   . Hypertension    a. Normal renal arteries by duplex 2013.  [name] Hypothyroidism   . Migraine   . PAD (peripheral artery disease) ([place])    a. 04/2013: s/p PCI to occluded right popliteal artery   . PAF (paroxysmal atrial fibrillation) ([place])    a. s/p MAZE 2006. b. Event monitor 2013: NSR  with PACs.  . Sinus bradycardia    a. 03/2011 during hospital admission - beta blocker stopped.  . Stroke [facility])    a. Pt reports 3-4 prior strokes without residual sx. b. CVA 2013 (presented with headache) - started on Plavix. Event monitor as outpt - no AF.    Past Surgical History:  Procedure Laterality Date  . LOWER EXTREMITY ANGIOGRAM N/A 05/15/2013   Performed by Lorretta Harp, MD at [facility] CATH LAB  . MAZE  ~ 2006  . PERCUTANEOUS STENT INTERVENTION Right 05/15/2013   Performed by Lorretta Harp, MD at [facility] CATH LAB  . POPLITEAL ARTERY STENT  05/15/2013    Current Outpatient Medications  Medication Sig Dispense Refill  . ARMOUR THYROID 15 MG tablet TAKE 1 TABLET BY MOUTH EVERY DAY 90 tablet 1  . Azelastine HCl 0.15 % SOLN Use 1-2 sprays per nostril 2 times daily as needed 30 mL 5  . chlorthalidone (HYGROTON) 25 MG tablet TAKE 1 TABLET BY MOUTH ONCE DAILY 30 tablet 5  . clopidogrel (PLAVIX) 75 MG tablet TAKE 1 TABLET BY MOUTH DAILY AT 12:00 NOON 90 tablet 3  . COSOPT 22.3-6.8 MG/ML ophthalmic solution Place 1 drop into both eyes 2 (two) times daily. Reported on 06/21/2015  0  . hydrALAZINE (APRESOLINE) 10 MG tablet TAKE 1 TABLET BY MOUTH THREE TIMES DAILY. 90 tablet 1  . irbesartan (AVAPRO) 150 MG  tablet TAKE 1 TABLET BY MOUTH 2 TIMES DAILY 180 tablet 3  . LUMIGAN 0.01 % SOLN Place 1 drop into both eyes at bedtime.   0  . Omega-3 Fatty Acids (OMEGA 3 PO) Take 5 mLs by mouth daily at 3 pm. One teaspoon daily    . OVER THE COUNTER MEDICATION Nutra Burst one tablespoon with orange juice daily    . ranitidine (ZANTAC) 75 MG/5ML syrup take 10 milliliters by mouth twice a day 300 mL 3   No current facility-administered medications for this visit.     Allergies  Allergen Reactions  . Aceon [Perindopril Erbumine]     cough  . Amlodipine     Lower extremity discomfort  . Aspirin Cough  . Azor [Amlodipine-Olmesartan] Cough  . Beta Adrenergic Blockers     Bradycardia  . Hydralazine  Nausea And Vomiting and Other (See Comments)    Weakness   . Sulfamethoxazole-Trimethoprim Other (See Comments)    Unknown reaction  . Vitamin D Analogs Cough    Social History   Socioeconomic History  . Marital status: Married    Spouse name: Not on file  . Number of children: Not on file  . Years of education: Not on file  . Highest education level: Not on file  Social Needs  . Financial resource strain: Not on file  . Food insecurity - worry: Not on file  . Food insecurity - inability: Not on file  . Transportation needs - medical: Not on file  . Transportation needs - non-medical: Not on file  Occupational History  . Not on file  Tobacco Use  . Smoking status: Never Smoker  . Smokeless tobacco: Never Used  Substance and Sexual Activity  . Alcohol use: No    Alcohol/week: 0.0 oz  . Drug use: No  . Sexual activity: No  Other Topics Concern  . Not on file  Social History Narrative   Married. Lives with husband. Married 65 years in 2016. [place] 22 years in 2016. 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 6 greatgrandkids.    Finished 10th grade.       Retired from working in [employer] for [employer]          Family History  Problem Relation Age of Onset  . Hypertension Mother   . Ulcers Father   . Cervical cancer Sister   . Throat cancer Brother   . Lung cancer Brother   . Cancer Brother   . Allergic rhinitis Neg Hx   . Angioedema Neg Hx   . Asthma Neg Hx   . Atopy Neg Hx   . Eczema Neg Hx   . Immunodeficiency Neg Hx   . Urticaria Neg Hx     Review of Systems:  As stated in the HPI and otherwise negative.   BP (!) 152/70   Pulse (!) 52   Ht '5\' 3"'  (1.6 m)   Wt 140 lb 6.4 oz (63.7 kg)   SpO2 97%   BMI 24.87 kg/m   Physical Examination:  General: Well developed, well nourished, NAD  HEENT: OP clear, mucus membranes moist  SKIN: warm, dry. No rashes. Neuro: No focal deficits  Musculoskeletal: Muscle strength 5/5  all ext  Psychiatric: Mood and affect normal  Neck: No JVD, no carotid bruits, no thyromegaly, no lymphadenopathy.  Lungs:Clear bilaterally, no wheezes, rhonci, crackles Cardiovascular: Regular rhythm, brady.. No murmurs, gallops or rubs. Abdomen:Soft. Bowel sounds present. Non-tender.  Extremities: No lower extremity  edema. Pulses are 2 + in the bilateral DP/PT.  Echo 08/02/13: Normal LV function, LVEF=55-60%. Trace TR  EKG:  EKG is  not ordered today. The ekg ordered today demonstrates   Recent Labs: 11/12/2016: ALT 8; BUN 30; Creatinine, Ser 1.24; Hemoglobin 11.2; Platelets 166.0; Potassium 3.9; Sodium 141 01/04/2017: TSH 3.47   Lipid Panel    Component Value Date/Time   CHOL 224 (H) 11/12/2016 0941   TRIG 71.0 11/12/2016 0941   HDL 70.00 11/12/2016 0941   CHOLHDL 3 11/12/2016 0941   VLDL 14.2 11/12/2016 0941   LDLCALC 140 (H) 11/12/2016 0941     Wt Readings from Last 3 Encounters:  01/08/17 140 lb 6.4 oz (63.7 kg)  11/02/16 138 lb 9.6 oz (62.9 kg)  06/25/16 140 lb (63.5 kg)     Other studies Reviewed: Additional studies/ records that were reviewed today include: . Review of the above records demonstrates:    Assessment and Plan:   1. PAF/PACs/PVCs: Her palpitations are felt to be due to her premature beats. No atrial fib noted on monitor in October 2017.  She has had prior MAZE for PAF.   2. HTN: BP is within an acceptable range for her. Mostly systolic BP of 034-917 at home. Continue Avapro, chlorthalidone and hydralazine.    3. PAD: Stable. Followed in [facility] clinic by Dr Gwenlyn Found.    Current medicines are reviewed at length with the patient today.  The patient does not have concerns regarding medicines.  The following changes have been made:  no change  Labs/ tests ordered today include:   No orders of the defined types were placed in this encounter.   Disposition:   FU with me in 12  months  Signed, Lauree Chandler, MD 01/08/2017 2:42 PM    [facility] [address], [place], [place]  [address] Phone: [phone]; Fax: [phone]

## 2017-01-08 NOTE — Patient Instructions (Signed)

## 2017-02-02 ENCOUNTER — Ambulatory Visit: Payer: Medicare Other

## 2017-02-26 ENCOUNTER — Encounter: Payer: Self-pay | Admitting: Family Medicine

## 2017-02-26 ENCOUNTER — Ambulatory Visit (INDEPENDENT_AMBULATORY_CARE_PROVIDER_SITE_OTHER): Payer: Medicare Other | Admitting: Family Medicine

## 2017-02-26 VITALS — BP 140/84 | HR 39 | Temp 98.4°F | Ht 63.0 in | Wt 143.6 lb

## 2017-02-26 DIAGNOSIS — R05 Cough: Secondary | ICD-10-CM

## 2017-02-26 DIAGNOSIS — R059 Cough, unspecified: Secondary | ICD-10-CM

## 2017-02-26 MED ORDER — ALBUTEROL SULFATE HFA 108 (90 BASE) MCG/ACT IN AERS: 2.0000 | INHALATION_SPRAY | Freq: Four times a day (QID) | RESPIRATORY_TRACT | 0 refills | Status: DC | PRN

## 2017-02-26 NOTE — Progress Notes (Signed)
Thank you for the feedback I will pass along this scenario to both Michelle's at the [facility] to address.

## 2017-02-26 NOTE — Progress Notes (Signed)
    Subjective:  [name] is a 84 y.o. female who presents today with a chief complaint of cough.   HPI:  Cough, established problem, worsening Patient with several year history of chronic cough.  She has been evaluated by an allergist in the past and was told that she was allergic to dust and cockroaches.  She has tried several treatments for her cough in the past including ranitidine to treat potential reflux as well as azelastine nasal spray.  Neither of these have significantly seem to help.  Cough is worsened over the last several weeks to months.  No clear precipitating events.  She has not noticed anything that makes her cough better or worse.  ROS: Per HPI  PMH: she reports that  has never smoked. she has never used smokeless tobacco. She reports that she does not drink alcohol or use drugs.  Objective:  Physical Exam: BP 140/84 (BP Location: Left Arm, Patient Position: Sitting, Cuff Size: Normal)   Pulse (!) 39   Temp 98.4 F (36.9 C) (Oral)   Ht 5\' 3"  (1.6 m)   Wt 143 lb 9.6 oz (65.1 kg)   SpO2 99%   BMI 25.44 kg/m   Gen: NAD, resting comfortably CV: Regular.  Bradycardic.  No murmurs appreciated Pulm: NWOB, CTAB with no crackles, wheezes, or rhonchi   Assessment/Plan:  Chronic cough Unclear etiology.  Her respiratory exam is stable. She has underwent extensive work up including PFTS and CXR without clear etiology. She is been adequately treated for both postnasal drip as well as GERD.  She had PFTs done a couple years ago which appear to be normal.  We will empirically treat with albuterol inhaler to treat any component of asthma or reactive airway disease.  We will also place referral to pulmonology for further evaluation.  Return precautions reviewed.  Algis Greenhouse. Jerline Pain, MD 02/26/2017 11:30 AM

## 2017-02-26 NOTE — Patient Instructions (Signed)
Please start the albuterol.  We will send you to a lung doctor.  Take care, Dr Jerline Pain

## 2017-03-03 ENCOUNTER — Ambulatory Visit (INDEPENDENT_AMBULATORY_CARE_PROVIDER_SITE_OTHER): Payer: Medicare Other | Admitting: Cardiovascular Disease

## 2017-03-03 ENCOUNTER — Encounter: Payer: Self-pay | Admitting: Cardiovascular Disease

## 2017-03-03 VITALS — BP 168/70 | HR 58 | Ht 63.0 in | Wt 140.0 lb

## 2017-03-03 DIAGNOSIS — I998 Other disorder of circulatory system: Secondary | ICD-10-CM

## 2017-03-03 DIAGNOSIS — I70229 Atherosclerosis of native arteries of extremities with rest pain, unspecified extremity: Secondary | ICD-10-CM

## 2017-03-03 DIAGNOSIS — I10 Essential (primary) hypertension: Secondary | ICD-10-CM | POA: Diagnosis not present

## 2017-03-03 NOTE — Patient Instructions (Signed)
Medication Instructions: Your physician recommends that you continue on your current medications as directed. Please refer to the Current Medication list given to you today.   Testing/Procedures: Please schedule for September: Your physician has requested that you have a lower extremity arterial duplex. During this test, ultrasound is used to evaluate arterial blood flow in the legs. Allow one hour for this exam. There are no restrictions or special instructions.  Your physician has requested that you have an ankle brachial index (ABI). During this test an ultrasound and blood pressure cuff are used to evaluate the arteries that supply the arms and legs with blood. Allow thirty minutes for this exam. There are no restrictions or special instructions.  Follow-Up: Your physician recommends that you schedule a follow-up appointment as needed with Dr. Gwenlyn Found.

## 2017-03-03 NOTE — Assessment & Plan Note (Addendum)
History of critical limb ischemia status post right popliteal PTA and stenting using an IDEV stent (5.5 mm x 80 mm) with 1 vessel runoff via the anterior tibial. Her most recent Doppler performed 11/13/16 revealed a right ABI of 0.98 with a widely patent stent. There was some mild in-stent restenosis". She denies claudication. We will continue to follow her on an annual basis. She does remain on dual antiplatelet therapy.

## 2017-03-03 NOTE — Progress Notes (Signed)
03/03/2017 [name]   [birth year]  [id number]  Primary Physician Yong Channel Brayton Mars, MD Primary Cardiologist: Lorretta Harp MD Lupe Carney, [place]  HPI:  [name] is a 84 y.o.  married African American female mother of 4 children, grandmother of 7 grandchildren who was referred by Isaias Cowman PA-C at [facility] for critical limb ischemia. I last saw her in the office  03/03/16.[name] Her past medical history is remarkable for paroxysmal atrial fibrillation and hypertension. She has had strokes in the past and is on Plavix. She developed right calf pain in early March and was referred here for further evaluation. Arterial Doppler studies revealed a right ABI of 0.48 with an occluded right popliteal artery. Based on this I angiogrammed her 05/15/13 confirming this and performed PTA and stenting with reconstitution of the popliteal artery with an IDEV stent , With resolution of her claudication symptoms and improvement in her arterial Doppler studies of the right ABI 1.1. Her most recent Dopplers performed 11/13/16 revealed a right ABI 0.98 with a patent popliteal stent, and velocities of 352 cm/s which has remained stable. She denies claudication.   Current Meds  Medication Sig  . albuterol (PROVENTIL HFA;VENTOLIN HFA) 108 (90 Base) MCG/ACT inhaler Inhale 2 puffs into the lungs every 6 (six) hours as needed for wheezing or shortness of breath.  Francia Greaves THYROID 15 MG tablet TAKE 1 TABLET BY MOUTH EVERY DAY  . Azelastine HCl 0.15 % SOLN Use 1-2 sprays per nostril 2 times daily as needed  . chlorthalidone (HYGROTON) 25 MG tablet TAKE 1 TABLET BY MOUTH ONCE DAILY  . clopidogrel (PLAVIX) 75 MG tablet TAKE 1 TABLET BY MOUTH DAILY AT 12:00 NOON  . COSOPT 22.3-6.8 MG/ML ophthalmic solution Place 1 drop into both eyes 2 (two) times daily. Reported on 06/21/2015  . hydrALAZINE (APRESOLINE) 10 MG tablet TAKE 1 TABLET BY MOUTH THREE TIMES DAILY.  [name] irbesartan (AVAPRO) 150 MG tablet TAKE 1  TABLET BY MOUTH 2 TIMES DAILY  . LUMIGAN 0.01 % SOLN Place 1 drop into both eyes at bedtime.   . Omega-3 Fatty Acids (OMEGA 3 PO) Take 5 mLs by mouth daily at 3 pm. One teaspoon daily  . OVER THE COUNTER MEDICATION Nutra Burst one tablespoon with orange juice daily  . ranitidine (ZANTAC) 75 MG/5ML syrup take 10 milliliters by mouth twice a day     Allergies  Allergen Reactions  . Aceon [Perindopril Erbumine]     cough  . Amlodipine     Lower extremity discomfort  . Aspirin Cough  . Azor [Amlodipine-Olmesartan] Cough  . Beta Adrenergic Blockers     Bradycardia  . Hydralazine Nausea And Vomiting and Other (See Comments)    Weakness   . Sulfamethoxazole-Trimethoprim Other (See Comments)    Unknown reaction  . Vitamin D Analogs Cough    Social History   Socioeconomic History  . Marital status: Married    Spouse name: Not on file  . Number of children: Not on file  . Years of education: Not on file  . Highest education level: Not on file  Social Needs  . Financial resource strain: Not on file  . Food insecurity - worry: Not on file  . Food insecurity - inability: Not on file  . Transportation needs - medical: Not on file  . Transportation needs - non-medical: Not on file  Occupational History  . Not on file  Tobacco Use  . Smoking status: Never Smoker  .  Smokeless tobacco: Never Used  Substance and Sexual Activity  . Alcohol use: No    Alcohol/week: 0.0 oz  . Drug use: No  . Sexual activity: No  Other Topics Concern  . Not on file  Social History Narrative   Married. Lives with husband. Married 65 years in 2016. [place] 22 years in 2016. 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 6 greatgrandkids.    Finished 10th grade.       Retired from working in [employer] for [employer]           Review of Systems: General: negative for chills, fever, night sweats or weight changes.  Cardiovascular: negative for chest pain, dyspnea on  exertion, edema, orthopnea, palpitations, paroxysmal nocturnal dyspnea or shortness of breath Dermatological: negative for rash Respiratory: negative for cough or wheezing Urologic: negative for hematuria Abdominal: negative for nausea, vomiting, diarrhea, bright red blood per rectum, melena, or hematemesis Neurologic: negative for visual changes, syncope, or dizziness All other systems reviewed and are otherwise negative except as noted above.    Blood pressure (!) 168/70, pulse (!) 58, height 5\' 3"  (1.6 m), weight 140 lb (63.5 kg).  General appearance: alert and no distress Neck: no adenopathy, no carotid bruit, no JVD, supple, symmetrical, trachea midline and thyroid not enlarged, symmetric, no tenderness/mass/nodules Lungs: clear to auscultation bilaterally Heart: regular rate and rhythm, S1, S2 normal, no murmur, click, rub or gallop Extremities: extremities normal, atraumatic, no cyanosis or edema Pulses: 2+ and symmetric Skin: Skin color, texture, turgor normal. No rashes or lesions Neurologic: Alert and oriented X 3, normal strength and tone. Normal symmetric reflexes. Normal coordination and gait  EKG sinus bradycardia at 58 with right bundle branch block. I personally reviewed this EKG.  ASSESSMENT AND PLAN:   Critical lower limb ischemia/PAD History of critical limb ischemia status post right popliteal PTA and stenting using an IDEV stent (5.5 mm x 80 mm) with 1 vessel runoff via the anterior tibial. Her most recent Doppler performed 11/13/16 revealed a right ABI of 0.98 with a widely patent stent. There was some mild in-stent restenosis". She denies claudication. We will continue to follow her on an annual basis. She does remain on dual antiplatelet therapy.      Lorretta Harp MD FACP,FACC,FAHA, [facility] 03/03/2017 2:17 PM

## 2017-03-11 ENCOUNTER — Institutional Professional Consult (permissible substitution): Payer: Medicare Other | Admitting: Internal Medicine

## 2017-03-16 ENCOUNTER — Ambulatory Visit
Admission: RE | Admit: 2017-03-16 | Discharge: 2017-03-16 | Disposition: A | Discharge status: Home / Self Care | Payer: Medicare Other | Source: Ambulatory Visit | Attending: Family Medicine | Admitting: Family Medicine

## 2017-03-16 DIAGNOSIS — Z1231 Encounter for screening mammogram for malignant neoplasm of breast: Secondary | ICD-10-CM

## 2017-03-26 ENCOUNTER — Institutional Professional Consult (permissible substitution): Payer: Medicare Other | Admitting: Internal Medicine

## 2017-03-30 DIAGNOSIS — H401132 Primary open-angle glaucoma, bilateral, moderate stage: Secondary | ICD-10-CM | POA: Diagnosis not present

## 2017-04-28 ENCOUNTER — Other Ambulatory Visit: Payer: Self-pay | Admitting: Family Medicine

## 2017-04-29 ENCOUNTER — Telehealth: Payer: Self-pay | Admitting: Cardiovascular Disease

## 2017-04-29 NOTE — Telephone Encounter (Signed)
I spoke with pt. She reports about a month ago after eating she had a "spasm" in her chest. Took something for gas and it helped.  Also had palpitations. Episode earlier this week of tightness in chest associated with palpitations.  This morning around 5 AM felt tightness in chest and palpitations. Passing gas. Took something for gas and it helped. Felt weak after this.  I scheduled pt to see Cecilie Kicks, NP on March 11,2019 at 2:00.  I advised her if tightness returns and she felt it was related to her heart she should go to ED for evaluation.

## 2017-04-29 NOTE — Telephone Encounter (Signed)
Agree. Thanks

## 2017-04-29 NOTE — Telephone Encounter (Signed)
New Message  Pt c/o of Chest Pain: STAT if CP now or developed within 24 hours  1. Are you having CP right now? no  2. Are you experiencing any other symptoms (ex. SOB, nausea, vomiting, sweating)? Gas, fluttering, and weakness  3. How long have you been experiencing CP? For about a month but recently got worse  4. Is your CP continuous or coming and going? coming and going  5. Have you taken Nitroglycerin? no ?

## 2017-04-30 ENCOUNTER — Encounter: Payer: Self-pay | Admitting: Cardiology

## 2017-05-03 ENCOUNTER — Ambulatory Visit: Payer: Medicare Other | Admitting: Cardiology

## 2017-05-17 DIAGNOSIS — H25012 Cortical age-related cataract, left eye: Secondary | ICD-10-CM | POA: Diagnosis not present

## 2017-05-17 DIAGNOSIS — I10 Essential (primary) hypertension: Secondary | ICD-10-CM | POA: Diagnosis not present

## 2017-05-17 DIAGNOSIS — H2512 Age-related nuclear cataract, left eye: Secondary | ICD-10-CM | POA: Diagnosis not present

## 2017-05-17 DIAGNOSIS — H25042 Posterior subcapsular polar age-related cataract, left eye: Secondary | ICD-10-CM | POA: Diagnosis not present

## 2017-06-02 ENCOUNTER — Other Ambulatory Visit: Payer: Self-pay | Admitting: Allergy and Immunology

## 2017-06-02 ENCOUNTER — Other Ambulatory Visit: Payer: Self-pay | Admitting: Family Medicine

## 2017-06-02 DIAGNOSIS — K219 Gastro-esophageal reflux disease without esophagitis: Secondary | ICD-10-CM

## 2017-06-15 ENCOUNTER — Ambulatory Visit (INDEPENDENT_AMBULATORY_CARE_PROVIDER_SITE_OTHER): Payer: Medicare Other | Admitting: Allergy and Immunology

## 2017-06-15 ENCOUNTER — Encounter: Payer: Self-pay | Admitting: Allergy and Immunology

## 2017-06-15 VITALS — BP 150/62 | HR 64 | Resp 20

## 2017-06-15 DIAGNOSIS — R05 Cough: Secondary | ICD-10-CM | POA: Diagnosis not present

## 2017-06-15 DIAGNOSIS — K219 Gastro-esophageal reflux disease without esophagitis: Secondary | ICD-10-CM

## 2017-06-15 DIAGNOSIS — J3089 Other allergic rhinitis: Secondary | ICD-10-CM | POA: Diagnosis not present

## 2017-06-15 DIAGNOSIS — R059 Cough, unspecified: Secondary | ICD-10-CM

## 2017-06-15 MED ORDER — RANITIDINE HCL 300 MG PO TABS: 300.0000 mg | ORAL_TABLET | Freq: Every day | ORAL | 5 refills | Status: DC

## 2017-06-15 NOTE — Patient Instructions (Addendum)
  1.  Treat and prevent reflux:   A.  Dexilant 60 mg samples in a.m.  B.  Ranitidine 300 mg tablet in p.m.  C.  Consolidate caffeine and chocolate consumption  2.  Treat and prevent inflammation:   A.  OTC Nasacort 1 spray each nostril once a day  3.  If needed: OTC Mucinex DM 1-2 tablets 1-2 times a day  4.  Return to clinic in 3 weeks or earlier if problem

## 2017-06-15 NOTE — Progress Notes (Signed)
Follow-up Note  Referring Provider: [name], MD Primary Provider: [name], MD Date of Office Visit: 06/15/2017  Subjective:   [name] (DOB: Oct 29, 1925) is a 84 y.o. female who returns to the [facility] [place] on 06/15/2017 in re-evaluation of the following:  HPI: [name] presents to this clinic in evaluation of cough.  She was last evaluated by Dr. Verlin Fester on 23 September 2015.  I have never seen her in this clinic.  She has unrelenting cough for years.  This is a very severe cough associated with micturition and gagging and almost vomiting and constant spitting and throat clearing.  She does not have shortness of breath or chest tightness unless she is in 1 of her spells of cough.  Sometimes she will get nasal congestion especially if she is coughing.  She does not have any anosmia or ugly nasal discharge.  Apparently in the past she has seen multiple physicians for this cough although she cannot remember the details of that evaluation.  She does remember having something stuck through her nose in evaluation of either her throat or her reflux.  She has been given some liquid ranitidine to use in the empiric therapy of reflux induced cough which has not helped her.  She has been given nasal steroids and montelukast in the past which has not helped her.  Allergies as of 06/15/2017      Reactions   Aceon [perindopril Erbumine]    cough   Amlodipine    Lower extremity discomfort   Aspirin Cough   Azor [amlodipine-olmesartan] Cough   Beta Adrenergic Blockers    Bradycardia   Hydralazine Nausea And Vomiting, Other (See Comments)   Weakness   Sulfamethoxazole-trimethoprim Other (See Comments)   Unknown reaction   Vitamin D Analogs Cough      Medication List             ARMOUR THYROID 15 MG tablet Generic drug:  thyroid TAKE 1 TABLET BY MOUTH EVERY DAY   Azelastine HCl 0.15 % Soln Use 1-2 sprays per nostril 2 times daily as needed   cephALEXin  500 MG capsule Commonly known as:  KEFLEX   chlorthalidone 25 MG tablet Commonly known as:  HYGROTON TAKE 1 TABLET BY MOUTH ONCE DAILY   clopidogrel 75 MG tablet Commonly known as:  PLAVIX TAKE 1 TABLET BY MOUTH DAILY AT 12:00 NOON   COSOPT 22.3-6.8 MG/ML ophthalmic solution Generic drug:  dorzolamide-timolol Place 1 drop into both eyes 2 (two) times daily. Reported on 06/21/2015   hydrALAZINE 10 MG tablet Commonly known as:  APRESOLINE TAKE 1 TABLET BY MOUTH THREE TIMES DAILY.   irbesartan 150 MG tablet Commonly known as:  AVAPRO TAKE 1 TABLET BY MOUTH TWICE DAILY   LUMIGAN 0.01 % Soln Generic drug:  bimatoprost Place 1 drop into both eyes at bedtime.   OMEGA 3 PO Take 5 mLs by mouth daily at 3 pm. One teaspoon daily   ranitidine 75 MG/5ML syrup Commonly known as:  ZANTAC take 10 milliliters by mouth twice a day       Past Medical History:  Diagnosis Date  . Arthritis   . Chicken pox   . Glaucoma of both eyes   . High cholesterol   . Hypertension    a. Normal renal arteries by duplex 2013.  [name] Hypothyroidism   . Migraine   . PAD (peripheral artery disease) ([place])    a. 04/2013: s/p PCI to occluded right  popliteal artery   . PAF (paroxysmal atrial fibrillation) ([place])    a. s/p MAZE 2006. b. Event monitor 2013: NSR with PACs.  . Sinus bradycardia    a. 03/2011 during hospital admission - beta blocker stopped.  . Stroke [facility])    a. Pt reports 3-4 prior strokes without residual sx. b. CVA 2013 (presented with headache) - started on Plavix. Event monitor as outpt - no AF.    Past Surgical History:  Procedure Laterality Date  . LOWER EXTREMITY ANGIOGRAM N/A 05/15/2013   Procedure: LOWER EXTREMITY ANGIOGRAM;  Surgeon: Lorretta Harp, MD;  Location: [facility] CATH LAB;  Service: Cardiovascular;  Laterality: N/A;  . MAZE  ~ 2006  . POPLITEAL ARTERY STENT  05/15/2013    Review of systems negative except as noted in HPI / PMHx or noted below:  Review of Systems    Constitutional: Negative.   HENT: Negative.   Eyes: Negative.   Respiratory: Negative.   Cardiovascular: Negative.   Gastrointestinal: Negative.   Genitourinary: Negative.   Musculoskeletal: Negative.   Skin: Negative.   Neurological: Negative.   Endo/Heme/Allergies: Negative.   Psychiatric/Behavioral: Negative.      Objective:   Vitals:   06/15/17 1646  BP: (!) 150/62  Pulse: 64  Resp: 20          Physical Exam  HENT:  Head: Normocephalic.  Right Ear: Tympanic membrane, external ear and ear canal normal.  Left Ear: Tympanic membrane, external ear and ear canal normal.  Nose: Nose normal. No mucosal edema or rhinorrhea.  Mouth/Throat: Uvula is midline, oropharynx is clear and moist and mucous membranes are normal. No oropharyngeal exudate.  Eyes: Conjunctivae are normal.  Neck: Trachea normal. No tracheal tenderness present. No tracheal deviation present. No thyromegaly present.  Cardiovascular: Normal rate, regular rhythm, S1 normal, S2 normal and normal heart sounds.  No murmur heard. Pulmonary/Chest: Breath sounds normal. No stridor. No respiratory distress. She has no wheezes. She has no rales.  Musculoskeletal: She exhibits no edema.  Lymphadenopathy:       Head (right side): No tonsillar adenopathy present.       Head (left side): No tonsillar adenopathy present.    She has no cervical adenopathy.  Neurological: She is alert.  Skin: No rash noted. She is not diaphoretic. No erythema. Nails show no clubbing.    Diagnostics:    Spirometry was performed and demonstrated an FEV1 of 1.61 at 113 % of predicted.  Results of a chest x-ray obtained 29 July 2015 identified the following:  The lungs are well-expanded. There is no focal infiltrate. There is no pleural effusion. The heart and pulmonary vascularity are normal. There surgical clips in the left hilar region which appears stable. The bony thorax exhibits no acute abnormality.  Assessment and Plan:    1. LPRD (laryngopharyngeal reflux disease)   2. Other allergic rhinitis   3. Coughing     1.  Treat and prevent reflux:   A.  Dexilant 60 mg samples in a.m.  B.  Ranitidine 300 mg tablet in p.m.  C.  Consolidate caffeine and chocolate consumption  2.  Treat and prevent inflammation:   A.  OTC Nasacort 1 spray each nostril once a day  3.  If needed: OTC Mucinex DM 1-2 tablets 1-2 times a day  4.  Return to clinic in 3 weeks or earlier if problem  Raelee has very significant irritation of her airway and I suspect that this is possibly on the basis  of reflux and we will treat her aggressively for this condition as noted above.  She will also use some anti-inflammatory agents for her upper airway.  We will see what type of response we get in the next 3 weeks.  I will review all of her previous diagnostic studies and make a decision about further evaluation for this issue pending her response in 3 weeks.  Allena Katz, MD Allergy / Immunology [place]

## 2017-06-16 ENCOUNTER — Encounter: Payer: Self-pay | Admitting: Allergy and Immunology

## 2017-06-24 ENCOUNTER — Telehealth: Payer: Self-pay | Admitting: Allergy and Immunology

## 2017-06-24 NOTE — Telephone Encounter (Signed)
Patient was seen by Dr. Neldon Mc on 06-15-17. He told her at the time if she did not improve, to call back. She stated she is still the same, with no improvement.

## 2017-06-24 NOTE — Telephone Encounter (Signed)
Dr Kozlow please advise 

## 2017-06-28 ENCOUNTER — Encounter: Payer: Self-pay | Admitting: Allergy & Immunology

## 2017-06-28 ENCOUNTER — Ambulatory Visit (INDEPENDENT_AMBULATORY_CARE_PROVIDER_SITE_OTHER): Payer: Medicare Other | Admitting: Allergy & Immunology

## 2017-06-28 VITALS — BP 110/72 | HR 60 | Temp 98.0°F | Resp 20

## 2017-06-28 DIAGNOSIS — R05 Cough: Secondary | ICD-10-CM

## 2017-06-28 DIAGNOSIS — R059 Cough, unspecified: Secondary | ICD-10-CM

## 2017-06-28 MED ORDER — BENZONATATE 100 MG PO CAPS: 100.0000 mg | ORAL_CAPSULE | Freq: Three times a day (TID) | ORAL | 3 refills | Status: DC | PRN

## 2017-06-28 NOTE — Patient Instructions (Addendum)
1. Coughing - We will try adding on Tessalon pearls to see if this will help. - We will also refer you to ENT for further evaluation. - They should call you in one week to make an appointment.  - I think it is OK to stop the Dexilant and ranitidine since this is not helping at all. - Breathing test was normal when you saw Dr. Verlin Fester.   2. Return in about 6 months (around 12/29/2017).   Please inform us of any Emergency Department visits, hospitalizations, or changes in symptoms. Call us before going to the ED for breathing or allergy symptoms since we might be able to fit you in for a sick visit. Feel free to contact us anytime with any questions, problems, or concerns.  It was a pleasure to meet you today!  Websites that have reliable patient information: 1. American Academy of Asthma, Allergy, and Immunology: www.aaaai.org 2. Food Allergy Research and Education (FARE): foodallergy.org 3. Mothers of Asthmatics: http://www.asthmacommunitynetwork.org 4. American College of Allergy, Asthma, and Immunology: www.acaai.org

## 2017-06-28 NOTE — Telephone Encounter (Signed)
Please have patient see Dr. Verlin Fester today

## 2017-06-28 NOTE — Telephone Encounter (Signed)
appt made for patient to see Dr Ernst Bowler 06/28/17 @ 3:30p

## 2017-06-28 NOTE — Progress Notes (Signed)
FOLLOW UP  Date of Service/Encounter:  06/28/17   Assessment:   Coughing  [name] presents with continued cough.  She last saw Dr. Neldon Mc approximately 2 to 3 weeks ago and was started on aggressive reflux medications.  She was also started on Nasacort improvement in her symptoms.  She has been compliant with her PPI and her H2 blocker.  She has also been compliant with her nasal steroid.  She has continued to cough nonetheless.  In fact, she thinks that her symptoms have worsened.  She reports that Tussionex has helped in the past, but she prefers to not be on a daily controlled substance.  It should be noted that she did not cough once during her visit today.  In any case, I think the next best step would be an ENT evaluation to look for abnormalities of the airway.  In the interim, we will try Tessalon Perles to see if this might provide some help for her cough.  I recommended stopping the reflux medications since they did not help.  It should be noted that she did have a normal spirometry when she was evaluated by Dr. Verlin Fester initially.   Plan/Recommendations:   1. Coughing - We will try adding on Tessalon pearls to see if this will help. - We will also refer you to ENT for further evaluation. - They should call you in one week to make an appointment.  - I think it is OK to stop the Dexilant and ranitidine since this is not helping at all. - Breathing test was normal when you saw Dr. Verlin Fester.   2. Return in about 6 months (around 12/29/2017).   Subjective:   [name] is a 84 y.o. female presenting today for follow up of  Chief Complaint  Patient presents with  . Cough    worse than 2 weeks ago    [name] has a history of the following: Patient Active Problem List   Diagnosis Date Noted  . Coughing 08/12/2015  . Perennial allergic rhinitis 08/12/2015  . GERD (gastroesophageal reflux disease) 06/25/2014  . Hyperglycemia 06/25/2014  . Vitamin D deficiency  06/25/2014  . Hypothyroidism 06/07/2014  . Hyperlipidemia 06/07/2014  . Intracervical pessary 06/07/2014  . Sinus bradycardia 05/19/2013  . Critical lower limb ischemia/PAD 05/10/2013  . PAC (premature atrial contraction) 05/20/2011  . PAF (paroxysmal atrial fibrillation) ([place])   . Stroke ([place])   . Hypertension     History obtained from: chart review and patient.  [place] Primary Care Provider is [name], MD.     [name] is a 84 y.o. female presenting for a follow up visit.  She was last seen June 15, 2017.  She has a history of persistent cough for years.  At that time, Dr. Neldon Mc started her on Dexilant 60 mg a morning and ranitidine 300 mg at night.  She was encouraged to avoid caffeine and chocolate.  She was also started on Nasacort 1 spray per nostril daily as well as Mucinex 1-2 times daily.  In the interim, her symptoms have continued without improvement.  Since the last visit, [name] has continued to have coughing despite all of the GERD medications. She has been very compliant with the medications but her cough has not improved, and in fact she thinks that it has gotten worse. Her cough seems to be worse in after meal times, which is consistent with GERD. But the medications have not helped at all. She  does report some post-tussive emesis as well as some incontinence. She denies chest pain or shortness of breath.   She is interested in an ENT appointment. She has never seen an ENT in the past. We did discuss pulmonologist and bronchoscopies. However, she prefers to avoid this.  Otherwise, there have been no changes to her past medical history, surgical history, family history, or social history.    Review of Systems: a 14-point review of systems is pertinent for what is mentioned in HPI.  Otherwise, all other systems were negative. Constitutional: negative other than that listed in the HPI Eyes: negative other than that listed in the HPI Ears, nose, mouth,  throat, and face: negative other than that listed in the HPI Respiratory: negative other than that listed in the HPI Cardiovascular: negative other than that listed in the HPI Gastrointestinal: negative other than that listed in the HPI Genitourinary: negative other than that listed in the HPI Integument: negative other than that listed in the HPI Hematologic: negative other than that listed in the HPI Musculoskeletal: negative other than that listed in the HPI Neurological: negative other than that listed in the HPI Allergy/Immunologic: negative other than that listed in the HPI    Objective:   Blood pressure 110/72, pulse 60, temperature 98 F (36.7 C), temperature source Oral, resp. rate 20, SpO2 96 %. There is no height or weight on file to calculate BMI.   Physical Exam:  General: Alert, interactive, in no acute distress. Never heard a cough during the visit.  Eyes: No conjunctival injection bilaterally, no discharge on the right, no discharge on the left and no Horner-Trantas dots present. PERRL bilaterally. EOMI without pain. No photophobia.  Ears: Right TM pearly gray with normal light reflex, Left TM pearly gray with normal light reflex, Right TM intact without perforation and Left TM intact without perforation.  Nose/Throat: External nose within normal limits and septum midline. Turbinates edematous and pale with clear discharge. Posterior oropharynx mildly erythematous without cobblestoning in the posterior oropharynx. Tonsils 2+ without exudates.  Tongue without thrush. Lungs: Clear to auscultation without wheezing, rhonchi or rales. No increased work of breathing. CV: Normal S1/S2. No murmurs. Capillary refill <2 seconds.  Skin: Warm and dry, without lesions or rashes. Neuro:   Grossly intact. No focal deficits appreciated. Responsive to questions.  Diagnostic studies: none     Salvatore Marvel, MD  [facility] [place]

## 2017-06-28 NOTE — Telephone Encounter (Signed)
Left message for patient to call back and discuss symptoms and getting appointment scheduled. If patient calls back please work in to the 315 slot for same day. Patient must arrive by 315.

## 2017-07-01 ENCOUNTER — Telehealth: Payer: Self-pay

## 2017-07-01 NOTE — Telephone Encounter (Signed)
-----   Message from Valentina Shaggy, MD sent at 06/28/2017 10:19 PM EDT ----- ENT referral placed.

## 2017-07-01 NOTE — Telephone Encounter (Signed)
Referral placed in [facility] to Dr Velvet Bathe office. Notes have been faxed

## 2017-07-05 ENCOUNTER — Ambulatory Visit (INDEPENDENT_AMBULATORY_CARE_PROVIDER_SITE_OTHER): Payer: Medicare Other | Admitting: Family Medicine

## 2017-07-05 ENCOUNTER — Encounter: Payer: Self-pay | Admitting: Family Medicine

## 2017-07-05 ENCOUNTER — Ambulatory Visit: Payer: Self-pay

## 2017-07-05 VITALS — BP 122/86 | HR 48 | Temp 98.2°F | Ht 63.0 in | Wt 137.6 lb

## 2017-07-05 DIAGNOSIS — R002 Palpitations: Secondary | ICD-10-CM

## 2017-07-05 DIAGNOSIS — R05 Cough: Secondary | ICD-10-CM

## 2017-07-05 DIAGNOSIS — I10 Essential (primary) hypertension: Secondary | ICD-10-CM | POA: Diagnosis not present

## 2017-07-05 DIAGNOSIS — R001 Bradycardia, unspecified: Secondary | ICD-10-CM | POA: Diagnosis not present

## 2017-07-05 DIAGNOSIS — R059 Cough, unspecified: Secondary | ICD-10-CM

## 2017-07-05 NOTE — Assessment & Plan Note (Signed)
S: controlled on chlorthalidone 25mg , hydralazine 10mg  TID, irbesartan 150mg  TID BP Readings from Last 3 Encounters:  07/05/17 122/86  06/28/17 110/72  06/15/17 (!) 150/62  A/P: We discussed blood pressure goal of <140/90. Continue current meds: She continues to want to attribute her cough to her current blood pressure medicines-she is doing really well on her current regimen and I really want her to continue.  I discussed with her that prior changes in blood pressure medicines have not resolved her cough

## 2017-07-05 NOTE — Telephone Encounter (Signed)
Patient called in with c/o "dry cough for several months." She says "this weekend I thought I had acid on my stomach and I chewed some Tums. I checked my BP and it was 188/80's, but my HR was 144. I was feeling shaky and went to lay down. My HR started going down and went all the way to in the 50's, where it normally is. My BP went to 120/55." I asked her to check her BP, she says "it is 190/65, P 55. I will go ahead and take my medication." I asked does the acid reflux flares happen often, she says "it happens more often than it should. I have this cough for a few months now and have seen an allergy specialist and will be going to an ENT in June. I don't know what is causing this cough." I asked about other symptoms, she denies. According to protocol, see PCP within 2 weeks, appointment scheduled for today with Dr. Yong Channel, care advice given, patient verbalized understanding.  Reason for Disposition . [1] History of gastric reflux AND [2] intermittent symptoms of sour taste in mouth AND [3] dry cough  Answer Assessment - Initial Assessment Questions 1. ONSET: "When did the cough begin?"      A few months ago 2. SEVERITY: "How bad is the cough today?"      Not bad 3. RESPIRATORY DISTRESS: "Describe your breathing."      No problems 4. FEVER: "Do you have a fever?" If so, ask: "What is your temperature, how was it measured, and when did it start?"     No 5. SPUTUM: "Describe the color of your sputum" (e.g., clear, white, yellow, green), "Has there been any change recently?"     Clear 6. HEMOPTYSIS: "Are you coughing up any blood?" If so ask: "How much, flecks, streaks, tablespoons, etc.?"     No 7. CARDIAC HISTORY: "Do you have any history of heart disease?" (e.g., heart attack, congestive heart failure)      Yes 8. LUNG HISTORY: "Do you have any history of lung disease?"  (e.g., pulmonary embolus, asthma, emphysema/COPD)     No 9. OTHER SYMPTOMS: "Do you have any other symptoms? (e.g., runny  nose, wheezing, chest pain)     No 10. PREGNANCY: "Is there any chance you are pregnant?" "When was your last menstrual period?"       No 11. TRAVEL: "Have you traveled out of the country in the last month?" (e.g., travel history, exposures)       No  Protocols used: COUGH - CHRONIC-A-AH

## 2017-07-05 NOTE — Patient Instructions (Addendum)
Health Maintenance Due  Topic Date Due  . TETANUS/TDAP -Informed patient to have it done at her pharmacy. 01/02/1945  . PNA vac Low Risk Adult (1 of 2 - PCV13)- would recommend we give you this.  We can give it today if you would like 01/01/1993    EKG looks pretty reassuring- just very low heart rate once again. You can also have your heart doctors take a look at it next time you see them- you could call them as well if you wanted to sit down and discuss your symptoms. If you have chest pain, shortness of breath, "heart shaking", palpitations- please go to the emergency room  Please take your blood pressure medicines everyday at current times as long as blood pressure not under 100/55- then call for advice  If you have another episode of your heart racing like that-I want you go to the emergency room.  If we can capture it on an EKG that would be terrific-if not we will need to wear a prolonged heart monitor most likely  I would advise you to try the Tessalon Perles.  These are not narcotics.  Continue to follow-up with pulmonology

## 2017-07-05 NOTE — Progress Notes (Signed)
Subjective:  [name] is a 84 y.o. year old very pleasant female patient who presents for/with See problem oriented charting ROS-palpitations reported.  Continued cough.  No shortness of breath.  No increased edema.  Has had palpitations  Past Medical History-  Patient Active Problem List   Diagnosis Date Noted  . Critical lower limb ischemia/PAD 05/10/2013    Priority: High  . PAF (paroxysmal atrial fibrillation) (HCC)     Priority: High  . Stroke [facility])     Priority: High  . Hypothyroidism 06/07/2014    Priority: Medium  . Hyperlipidemia 06/07/2014    Priority: Medium  . Hypertension     Priority: Medium  . GERD (gastroesophageal reflux disease) 06/25/2014    Priority: Low  . Hyperglycemia 06/25/2014    Priority: Low  . Vitamin D deficiency 06/25/2014    Priority: Low  . Intracervical pessary 06/07/2014    Priority: Low  . Sinus bradycardia 05/19/2013    Priority: Low  . PAC (premature atrial contraction) 05/20/2011    Priority: Low  . Coughing 08/12/2015  . Perennial allergic rhinitis 08/12/2015    Medications- reviewed and updated Current Outpatient Medications  Medication Sig Dispense Refill  . ARMOUR THYROID 15 MG tablet TAKE 1 TABLET BY MOUTH EVERY DAY 90 tablet 0  . Azelastine HCl 0.15 % SOLN Use 1-2 sprays per nostril 2 times daily as needed 30 mL 5  . chlorthalidone (HYGROTON) 25 MG tablet TAKE 1 TABLET BY MOUTH ONCE DAILY 30 tablet 5  . clopidogrel (PLAVIX) 75 MG tablet TAKE 1 TABLET BY MOUTH DAILY AT 12:00 NOON 90 tablet 3  . COSOPT 22.3-6.8 MG/ML ophthalmic solution Place 1 drop into both eyes 2 (two) times daily. Reported on 06/21/2015  0  . hydrALAZINE (APRESOLINE) 10 MG tablet TAKE 1 TABLET BY MOUTH THREE TIMES DAILY. 90 tablet 0  . irbesartan (AVAPRO) 150 MG tablet TAKE 1 TABLET BY MOUTH TWICE DAILY 180 tablet 0  . LUMIGAN 0.01 % SOLN Place 1 drop into both eyes at bedtime.   0  . Omega-3 Fatty Acids (OMEGA 3 PO) Take 5 mLs by mouth daily at 3 pm.  One teaspoon daily     No current facility-administered medications for this visit.     Objective: BP 122/86 (BP Location: Left Arm, Patient Position: Sitting, Cuff Size: Normal)   Pulse (!) 48   Temp 98.2 F (36.8 C) (Oral)   Ht 5\' 3"  (1.6 m)   Wt 137 lb 9.6 oz (62.4 kg)   SpO2 97%   BMI 24.37 kg/m  Gen: NAD, resting comfortably. Does not cough at all during visit CV: bradycardic with rate 48 no murmurs rubs or gallops Lungs: CTAB no crackles, wheeze, rhonchi Abdomen: soft/nontender/nondistended/normal bowel sounds.  Ext: no edema Skin: warm, dry, no rash  EKG: sinus bradycaria with rate of 43, normal axis, normal intervals other than prolonged qrs, rsr' in v1 indicates right bundle branch block, appears to nonspecific st- t wave changes with biphasic or flattened t waves in avf, v1-v6. Reviewed last few EKGs and appears to have some fluctuations in v2-v6 including at times biphasic, normal, inverted.   Assessment/Plan:  Palpitations S: Saturday night  Thought she had gas and took some baking soda and water- started getting gas up. Had shaking sensation in her chest- denies any pain- this really sounds like palpitations.  Heart rate up to 144 and bloo he d pressure was up above 180. She got in bed and symptoms calmed down and  BP went down to 130 as gas level decreased. Triggers for gas have included- really cold water, overeating so she has tried to eat less. Has lost slight weight.   Had similar issue earlier this year when drinking ice cold water- then developing gassy feeling. Not short of breath with either episode and no chest pain.  A/P: Sound like palpitations related to gas related to clear trigger of cold water- encouraged her to avoid her trigger. EKG reassuring to me- she asks that I run it by her primary cardiologist as well Dr. Angelena Form which is reasonable.  -We need to consider repeat blood work in the future.  If palpitations persist-CBC, CMP, TSH. - in regards to HR  elevation suspect was anxiety related. Doubt a fib. Given infrequent episodes- not sure cardiac monitor would be helpful- plus patient declines this for now  Hypertension S: controlled on chlorthalidone 25mg , hydralazine 10mg  TID, irbesartan 150mg  TID BP Readings from Last 3 Encounters:  07/05/17 122/86  06/28/17 110/72  06/15/17 (!) 150/62  A/P: We discussed blood pressure goal of <140/90. Continue current meds: She continues to want to attribute her cough to her current blood pressure medicines-she is doing really well on her current regimen and I really want her to continue.  I discussed with her that prior changes in blood pressure medicines have not resolved her cough  Coughing S:  patient with extensive workup for chronic cough in the past.  Coughing fits then sneezes and stops then nose runs high level and eyes watering. She has had chest x-ray and pulmonary function test-which were normal.  Currently seeing allergist and has been referred to ENT by them.  Only allergies- dust and cockroaches. Recently Dexilant and ranitidine were stopped by allergist as did not seem to be helping (patient denies reflux to me- though this was reporte din phone note).  Encouraged her to try Ladona Ridgel at last visit- but she has not worried that it is a narcotic (I advised her it is not). tussionex only thing that helps but she started seeing visions so she is firmly against it or other narcotics.  A/P: Chronic cough-encouraged patient to continue to follow-up with ENT and allergist.  Encouraged her to follow through with their advice such as trial of Tessalon Perles.  She needed a lot of reassurance-she is frustrated with health concerns including the cough and recent palpitations.  As per Dr. Gillermina Hu note- patient did not cough a single time during our encounter.  We may simply be missing these as she reports they come in spells   Future Appointments  Date Time Provider [place]  11/04/2017  10:00 AM [facility] 2 [facility]   Lab/Order associations: Bradycardia - Plan: EKG 12-Lead  Time Stamp The duration of face-to-face time during this visit was greater than 25 minutes. Greater than 50% of this time was spent in counseling, explanation of diagnosis, planning of further management, and/or coordination of care including primarily providing reassurance to patient and encouragement to have her follow-up with ENT and allergist and to follow through with their recommendations.  Return precautions advised.  Garret Reddish, MD

## 2017-07-05 NOTE — Assessment & Plan Note (Signed)
S:  patient with extensive workup for chronic cough in the past.  Coughing fits then sneezes and stops then nose runs high level and eyes watering. She has had chest x-ray and pulmonary function test-which were normal.  Currently seeing allergist and has been referred to ENT by them.  Only allergies- dust and cockroaches. Recently Dexilant and ranitidine were stopped by allergist as did not seem to be helping (patient denies reflux to me- though this was reporte din phone note).  Encouraged her to try Ladona Ridgel at last visit- but she has not worried that it is a narcotic (I advised her it is not). tussionex only thing that helps but she started seeing visions so she is firmly against it or other narcotics.  A/P: Chronic cough-encouraged patient to continue to follow-up with ENT and allergist.  Encouraged her to follow through with their advice such as trial of Tessalon Perles.  She needed a lot of reassurance-she is frustrated with health concerns including the cough and recent palpitations.  As per Dr. Gillermina Hu note- patient did not cough a single time during our encounter.  We may simply be missing these as she reports they come in spells

## 2017-07-07 ENCOUNTER — Telehealth: Payer: Self-pay

## 2017-07-07 ENCOUNTER — Ambulatory Visit: Payer: Medicare Other | Admitting: Allergy and Immunology

## 2017-07-07 NOTE — Telephone Encounter (Signed)
Called and spoke with patient who verbalized understanding.

## 2017-07-07 NOTE — Telephone Encounter (Signed)
-----   Message from [name], MD sent at 07/06/2017  9:37 PM EDT ----- Thanks Dr. Angelena Form. I know she will be reassured by you taking a look- team can you inform patient that she does not have to call cardiology for follow up unless she has recurrence of the high heart rate? I really think the high heart rate was prompted by her anxiety about the gas.   Thanks, Garret Reddish  ----- Message ----- From: Burnell Blanks, MD Sent: 07/06/2017  10:39 AM To: [name], MD  Henrene Pastor, I took a look at the EKG.  I agree there is no major change. I don't think I probably need to see her unless she has recurrent episodes of tachycardia. Thanks for seeing her and thanks for seeing so many of the people that we share. Everyone I have sent to you really appreciates the care you provide. Gerald Stabs  ----- Message ----- From: [name], MD Sent: 07/05/2017  10:34 PM To: Burnell Blanks, MD  Dr. Angelena Form,  Ms. Imhoff had some palpitations one night for a few minutes when she was dealing with "gas" per her. She says HR got up to 144 when she usually runs in 34s or 50s. No CP or SOB. HR went back to normal once she belched for a bit.  She wanted me to do an EKG to reassure her. Would you mind looking at EKG from 07/05/17.?It appears she has some flattening vs. Inversions vs. Biphasic waves in v1-v6- this does not appear to be a major change to me from prior EKGs where she has these issues intermittently. Patient was wondering if she needed a visit with you ( I told her I didn't think she needed one most likely)- I told her I would message you to get your opinion.  Thanks,  Garret Reddish- her PCP

## 2017-07-08 NOTE — Telephone Encounter (Signed)
Patient set up for 07-28-2017.

## 2017-07-09 ENCOUNTER — Other Ambulatory Visit: Payer: Self-pay

## 2017-07-09 ENCOUNTER — Encounter (HOSPITAL_COMMUNITY): Payer: Self-pay | Admitting: Emergency Medicine

## 2017-07-09 ENCOUNTER — Emergency Department (HOSPITAL_COMMUNITY): Payer: Medicare Other

## 2017-07-09 ENCOUNTER — Emergency Department (HOSPITAL_COMMUNITY)
Admission: EM | Admit: 2017-07-09 | Discharge: 2017-07-10 | Disposition: A | Discharge status: Home / Self Care | Payer: Medicare Other | Attending: Emergency Medicine | Admitting: Emergency Medicine

## 2017-07-09 DIAGNOSIS — Z5321 Procedure and treatment not carried out due to patient leaving prior to being seen by health care provider: Secondary | ICD-10-CM | POA: Insufficient documentation

## 2017-07-09 DIAGNOSIS — R05 Cough: Secondary | ICD-10-CM | POA: Diagnosis not present

## 2017-07-09 DIAGNOSIS — R079 Chest pain, unspecified: Secondary | ICD-10-CM | POA: Insufficient documentation

## 2017-07-09 LAB — CBC
HCT: 33.7 % — ABNORMAL LOW (ref 36.0–46.0)
Hemoglobin: 10.6 g/dL — ABNORMAL LOW (ref 12.0–15.0)
MCH: 26.2 pg (ref 26.0–34.0)
MCHC: 31.5 g/dL (ref 30.0–36.0)
MCV: 83.4 fL (ref 78.0–100.0)
PLATELETS: 192 10*3/uL (ref 150–400)
RBC: 4.04 MIL/uL (ref 3.87–5.11)
RDW: 13.9 % (ref 11.5–15.5)
WBC: 3.5 10*3/uL — ABNORMAL LOW (ref 4.0–10.5)

## 2017-07-09 LAB — I-STAT TROPONIN, ED: TROPONIN I, POC: 0 ng/mL (ref 0.00–0.08)

## 2017-07-09 NOTE — Telephone Encounter (Signed)
Noted. Thanks for keeping me in the loop!   Salvatore Marvel, MD [facility] [place]

## 2017-07-09 NOTE — ED Triage Notes (Signed)
Reports pressure to left upper chest that started today.  Also endorses feeling weak and having a lot of coughing for two months with lots of mucous.  Reports that stopped a few days ago.  Was recently diagnosed with reflux.

## 2017-07-10 LAB — BASIC METABOLIC PANEL
Anion gap: 6 (ref 5–15)
BUN: 30 mg/dL — AB (ref 6–20)
CHLORIDE: 109 mmol/L (ref 101–111)
CO2: 25 mmol/L (ref 22–32)
CREATININE: 1.78 mg/dL — AB (ref 0.44–1.00)
Calcium: 9.2 mg/dL (ref 8.9–10.3)
GFR calc Af Amer: 28 mL/min — ABNORMAL LOW (ref >60)
GFR calc non Af Amer: 24 mL/min — ABNORMAL LOW (ref >60)
GLUCOSE: 110 mg/dL — AB (ref 65–99)
Potassium: 3.8 mmol/L (ref 3.5–5.1)
Sodium: 140 mmol/L (ref 135–145)

## 2017-07-14 ENCOUNTER — Telehealth: Payer: Self-pay | Admitting: Family Medicine

## 2017-07-14 NOTE — Telephone Encounter (Signed)
Lets schedule an office visit to go over all results.

## 2017-07-14 NOTE — Telephone Encounter (Signed)
See note

## 2017-07-14 NOTE — Telephone Encounter (Signed)
Copied from [place]. Topic: Quick Communication - See Telephone Encounter >> Jul 14, 2017 12:00 PM Aurelio Brash B wrote: CRM for notification. See Telephone encounter for: 07/14/17.  PT was seen in ER on Friday  5/17  but did not wait for results, she was told she could get results from [place]-  chest xray results, ekg results and lab results

## 2017-07-14 NOTE — Telephone Encounter (Signed)
Please advise 

## 2017-07-15 NOTE — Telephone Encounter (Signed)
Patient is scheduled for tomorrow.

## 2017-07-16 ENCOUNTER — Encounter: Payer: Self-pay | Admitting: Family Medicine

## 2017-07-16 ENCOUNTER — Ambulatory Visit (INDEPENDENT_AMBULATORY_CARE_PROVIDER_SITE_OTHER): Payer: Medicare Other | Admitting: Family Medicine

## 2017-07-16 VITALS — BP 150/56 | HR 48 | Temp 98.0°F | Ht 63.0 in | Wt 135.6 lb

## 2017-07-16 DIAGNOSIS — D649 Anemia, unspecified: Secondary | ICD-10-CM | POA: Diagnosis not present

## 2017-07-16 DIAGNOSIS — R0789 Other chest pain: Secondary | ICD-10-CM

## 2017-07-16 DIAGNOSIS — I10 Essential (primary) hypertension: Secondary | ICD-10-CM

## 2017-07-16 NOTE — Patient Instructions (Addendum)
I want you to stay well hydrated over the weekend  Come back on Tuesday morning for labs. Bring the stool cards if you can.   If you have worsening kidney function or it simply hasnt improved- I will likely stop chlorthalidone and put you back on amlodipine> I will also try to get you into the kidney doctor although this usually takes some time (a few months)

## 2017-07-16 NOTE — Assessment & Plan Note (Addendum)
BP slightly high today- do not feel strongly about reducing meds but also dont feel strongly about increasing medications until we know where kidneys stand.   when I last saw [name] for labs in September she did have a slight bump in creatinine and we had planned to repeat with next blood work- but creatinine up to nearly 1.8 is a significant worsening.With the anemia -I am also worried that this may represent chronic kidney disease if [name] kidney function is depressed with filtration rate under 30 then we will stop chlorthalidone and start amlodipine and follow-up blood pressure and BMP in another week.  Would also refer [name] to nephrology at that point.  Would consider stopping irbesartan as well potentially

## 2017-07-16 NOTE — Progress Notes (Signed)
Subjective:  [name] is a 84 y.o. year old very pleasant female patient who presents for/with See problem oriented charting ROS- denies shortness of breath, dizziness, edema. No exertional chest pain reported   Past Medical History-  Patient Active Problem List   Diagnosis Date Noted  . Critical lower limb ischemia/PAD 05/10/2013    Priority: High  . PAF (paroxysmal atrial fibrillation) (HCC)     Priority: High  . Stroke [facility])     Priority: High  . Hypothyroidism 06/07/2014    Priority: Medium  . Hyperlipidemia 06/07/2014    Priority: Medium  . Hypertension     Priority: Medium  . GERD (gastroesophageal reflux disease) 06/25/2014    Priority: Low  . Hyperglycemia 06/25/2014    Priority: Low  . Vitamin D deficiency 06/25/2014    Priority: Low  . Intracervical pessary 06/07/2014    Priority: Low  . Sinus bradycardia 05/19/2013    Priority: Low  . PAC (premature atrial contraction) 05/20/2011    Priority: Low  . Coughing 08/12/2015  . Perennial allergic rhinitis 08/12/2015    Medications- reviewed and updated Current Outpatient Medications  Medication Sig Dispense Refill  . ARMOUR THYROID 15 MG tablet TAKE 1 TABLET BY MOUTH EVERY DAY 90 tablet 0  . Azelastine HCl 0.15 % SOLN Use 1-2 sprays per nostril 2 times daily as needed 30 mL 5  . chlorthalidone (HYGROTON) 25 MG tablet TAKE 1 TABLET BY MOUTH ONCE DAILY 30 tablet 5  . clopidogrel (PLAVIX) 75 MG tablet TAKE 1 TABLET BY MOUTH DAILY AT 12:00 NOON 90 tablet 3  . COSOPT 22.3-6.8 MG/ML ophthalmic solution Place 1 drop into both eyes 2 (two) times daily. Reported on 06/21/2015  0  . hydrALAZINE (APRESOLINE) 10 MG tablet TAKE 1 TABLET BY MOUTH THREE TIMES DAILY. 90 tablet 0  . irbesartan (AVAPRO) 150 MG tablet TAKE 1 TABLET BY MOUTH TWICE DAILY 180 tablet 0  . LUMIGAN 0.01 % SOLN Place 1 drop into both eyes at bedtime.   0  . Omega-3 Fatty Acids (OMEGA 3 PO) Take 5 mLs by mouth daily at 3 pm. One teaspoon daily     No  current facility-administered medications for this visit.     Objective: BP (!) 150/56 (BP Location: Left Arm, Patient Position: Sitting, Cuff Size: Large)   Pulse (!) 48   Temp 98 F (36.7 C) (Oral)   Ht 5\' 3"  (1.6 m)   Wt 135 lb 9.6 oz (61.5 kg)   SpO2 98%   BMI 24.02 kg/m  Gen: NAD, resting comfortably CV: Bradycardic but regular-no murmurs rubs or gallops No chest wall pain today Lungs: CTAB no crackles, wheeze, rhonchi Abdomen: soft/nontender/nondistended/normal bowel sounds. No rebound or guarding.  Ext: no edema Skin: warm, dry Neuro: grossly normal, moves all extremities  EKG from ED interpreted: sinus bradycardia with some rate varation/sinus arrhythmia. Right bundle branch block not new. Normal intervals. Possible LVH. T wave inversions in v1- v6. EKG is reasonably stable from prior EKGs= has had intermittent inversions in these leads vs. Biphasic.   Assessment/Plan:   Left upper chest pain S:  left upper chest pain lasting Friday morning through Friday evening before she deicded to go to the emergency room. Waited for 8 hours (through 7 AM)  and still had 17 people ahead of her. Pain went away sometime on Saturday. Has not returned this week. Was not short of breath with it, dizzy. Felt somewhat weak/low energy. occasional palpitation with it. She is not  sure what triggered the chest pain other than she think sshe may have lifted something too heavy. Was not exerting herself when pain started. Pain did not get worse with exertion. Pain did not get better when she relaxed  A/P: We reviewed her x-ray together from the emergency room-no acute cardiopulmonary disease, did have cardiomegaly and aortic atherosclerosis.  We reviewed her EKG which was largely stable from previous EKGs although T wave inversions were slightly more prominent.  Her troponin was negative and this was after over 8 hours of chest pain.  She reports lifting something heavy and thinking this may have been a  strain-I agree this is highly probable.  Her pain pattern does not sound cardiac.  We discussed reasons for follow-up in regards for chest pain.  She denies any jittery sensation in her heart or high heart rate as she had last visit with me.  Her labs showed slight worsening of anemia-we will get stool cards.  I am also concerned about the potential for chronic kidney disease.  It appears her kidney function may have been reduced to a GFR under 30.  She reports she had been drinking a lot of water when coughing a lot. Coughing stopped about a week ago and then stopped drinking nearly same amount of water. Encouraged her to go back to drinking water regularly.  We will recheck both a CBC and a CMP on Monday.   Hypertension BP slightly high today- do not feel strongly about reducing meds but also dont feel strongly about increasing medications until we know where kidneys stand.   when I last saw her for labs in September she did have a slight bump in creatinine and we had planned to repeat with next blood work- but creatinine up to nearly 1.8 is a significant worsening.With the anemia -I am also worried that this may represent chronic kidney disease if her kidney function is depressed with filtration rate under 30 then we will stop chlorthalidone and start amlodipine and follow-up blood pressure and BMP in another week.  Would also refer her to nephrology at that point.  Would consider stopping irbesartan as well potentially  Future Appointments  Date Time Provider [place]  07/20/2017  2:00 PM [facility]  07/22/2017  9:00 AM Williemae Area, RN [facility]  11/04/2017 10:00 AM [facility] 2 [facility]   Lab/Order associations: Hypertension, essential - Plan: CBC, Comprehensive metabolic panel  Anemia, unspecified type - Plan: Hemoccult Cards (X3 cards)  Time Stamp The duration of face-to-face time during this visit was greater than 25 minutes. Greater than 50%  of this time was spent in counseling, explanation of diagnosis, planning of further management, and/or coordination of care including discussion of labs from hospital as well as EKG and CXr plus reassuring patient about chest pain which does not appear cardiac in nature.   Return precautions advised.  Garret Reddish, MD

## 2017-07-20 ENCOUNTER — Other Ambulatory Visit (INDEPENDENT_AMBULATORY_CARE_PROVIDER_SITE_OTHER): Payer: Medicare Other

## 2017-07-20 DIAGNOSIS — I10 Essential (primary) hypertension: Secondary | ICD-10-CM | POA: Diagnosis not present

## 2017-07-20 LAB — COMPREHENSIVE METABOLIC PANEL
ALT: 12 U/L (ref 0–35)
AST: 14 U/L (ref 0–37)
Albumin: 4 g/dL (ref 3.5–5.2)
Alkaline Phosphatase: 58 U/L (ref 39–117)
BUN: 23 mg/dL (ref 6–23)
CHLORIDE: 103 meq/L (ref 96–112)
CO2: 26 meq/L (ref 19–32)
Calcium: 9.4 mg/dL (ref 8.4–10.5)
Creatinine, Ser: 1.29 mg/dL — ABNORMAL HIGH (ref 0.40–1.20)
GFR: 49.98 mL/min — ABNORMAL LOW (ref >60.00)
GLUCOSE: 115 mg/dL — AB (ref 70–99)
Potassium: 4 mEq/L (ref 3.5–5.1)
SODIUM: 136 meq/L (ref 135–145)
Total Bilirubin: 0.5 mg/dL (ref 0.2–1.2)
Total Protein: 7 g/dL (ref 6.0–8.3)

## 2017-07-20 LAB — CBC
HCT: 33.1 % — ABNORMAL LOW (ref 36.0–46.0)
Hemoglobin: 11.2 g/dL — ABNORMAL LOW (ref 12.0–15.0)
MCHC: 33.8 g/dL (ref 30.0–36.0)
MCV: 81.2 fl (ref 78.0–100.0)
Platelets: 175 10*3/uL (ref 150.0–400.0)
RBC: 4.08 Mil/uL (ref 3.87–5.11)
RDW: 14.1 % (ref 11.5–15.5)
WBC: 3.1 10*3/uL — ABNORMAL LOW (ref 4.0–10.5)

## 2017-07-22 ENCOUNTER — Telehealth: Payer: Self-pay | Admitting: Family Medicine

## 2017-07-22 ENCOUNTER — Ambulatory Visit: Payer: Medicare Other | Admitting: *Deleted

## 2017-07-22 ENCOUNTER — Other Ambulatory Visit: Payer: Medicare Other

## 2017-07-22 NOTE — Telephone Encounter (Signed)
Copied from [place] [id number]. Topic: Quick Communication - Lab Results >> Jul 21, 2017 11:30 AM Everrett Coombe, CMA wrote: Called patient to inform them of 07/20/2017 lab results. When patient returns call, triage nurse may disclose results.    Pt returning call about lab work. CB# [id number]. Pt is leaving the home at 12.

## 2017-07-23 ENCOUNTER — Other Ambulatory Visit (INDEPENDENT_AMBULATORY_CARE_PROVIDER_SITE_OTHER): Payer: Medicare Other

## 2017-07-23 ENCOUNTER — Other Ambulatory Visit: Payer: Self-pay

## 2017-07-23 DIAGNOSIS — D649 Anemia, unspecified: Secondary | ICD-10-CM | POA: Diagnosis not present

## 2017-07-23 LAB — FECAL OCCULT BLOOD, IMMUNOCHEMICAL: Fecal Occult Bld: NEGATIVE

## 2017-07-27 DIAGNOSIS — H401131 Primary open-angle glaucoma, bilateral, mild stage: Secondary | ICD-10-CM | POA: Diagnosis not present

## 2017-07-28 DIAGNOSIS — R05 Cough: Secondary | ICD-10-CM | POA: Diagnosis not present

## 2017-07-28 DIAGNOSIS — K219 Gastro-esophageal reflux disease without esophagitis: Secondary | ICD-10-CM | POA: Diagnosis not present

## 2017-08-06 ENCOUNTER — Other Ambulatory Visit: Payer: Self-pay | Admitting: Family Medicine

## 2017-08-16 ENCOUNTER — Ambulatory Visit: Payer: Medicare Other

## 2017-08-16 ENCOUNTER — Encounter: Payer: Self-pay | Admitting: Family Medicine

## 2017-08-16 ENCOUNTER — Ambulatory Visit (INDEPENDENT_AMBULATORY_CARE_PROVIDER_SITE_OTHER): Payer: Medicare Other | Admitting: Family Medicine

## 2017-08-16 VITALS — BP 136/86 | HR 76 | Temp 98.5°F | Ht 63.0 in | Wt 136.6 lb

## 2017-08-16 DIAGNOSIS — I10 Essential (primary) hypertension: Secondary | ICD-10-CM

## 2017-08-16 DIAGNOSIS — D649 Anemia, unspecified: Secondary | ICD-10-CM | POA: Diagnosis not present

## 2017-08-16 NOTE — Assessment & Plan Note (Signed)
S: controlled on chlorthalidone 25mg , hydralazine 3x a day, irbesartan 150mg  twice a day  Last visit creatinine improved to high 40s after she increased her fluid intake.  BP Readings from Last 3 Encounters:  08/16/17 136/86  07/16/17 (!) 150/56  07/10/17 (!) 179/70  A/P: We discussed blood pressure goal of <140/90. Continue current meds: thrilled she is at goal today. She still wants to attribute her chronic cough to medicines but I stressed she needed these meds given stroke history and numerous side effects on prior attempts

## 2017-08-16 NOTE — Assessment & Plan Note (Signed)
S:stool card - luckily negative-  Anemia was stable on recheck in late may. Has some mild fatigue  A/P: We discussed possibly iron every other day. Discussed Gi referral- she declines as long as stable with stool cards negative  With next bloodwork could consider ferritin, b12 if covered.

## 2017-08-16 NOTE — Patient Instructions (Addendum)
Health Maintenance Due  Topic Date Due  . TETANUS/TDAP - informed patient to have it done at her pharmacy, and call us to update our records. 01/02/1945  . PNA vac Low Risk Adult (1 of 2 - PCV13) - Patient would like to wait until she stops all of her coughing. 01/01/1993   I would also like for you to sign up for an annual wellness visit with one of our nurses, Cassie or Manuela Schwartz, who both specialize in the annual wellness visit. This is a free benefit under medicare that may help [place] find additional ways to help you. Some highlights are reviewing medications, lifestyle, and doing a dementia screen.  Please check with your pharmacy to see if they have the shingrix vaccine. If they do- please get this immunization and update [place] by phone call or mychart with dates you receive the vaccine  _____________________________________________________ Continue current medicines- blood pressure looks great!  Take reflux medicine and follow up with ENT doctor  Our team can help fill out a form for handicap parking for your husband

## 2017-08-16 NOTE — Progress Notes (Signed)
Subjective:  [name] is a 84 y.o. year old very pleasant female patient who presents for/with See problem oriented charting ROS- intermittent cough due to throat irritation per her report, no edema. No reported shortness of breath.  No reported chest pain  Past Medical History-  Patient Active Problem List   Diagnosis Date Noted  . Critical lower limb ischemia/PAD 05/10/2013    Priority: High  . PAF (paroxysmal atrial fibrillation) (HCC)     Priority: High  . Stroke [facility])     Priority: High  . Hypothyroidism 06/07/2014    Priority: Medium  . Hyperlipidemia 06/07/2014    Priority: Medium  . Hypertension     Priority: Medium  . GERD (gastroesophageal reflux disease) 06/25/2014    Priority: Low  . Hyperglycemia 06/25/2014    Priority: Low  . Vitamin D deficiency 06/25/2014    Priority: Low  . Intracervical pessary 06/07/2014    Priority: Low  . Sinus bradycardia 05/19/2013    Priority: Low  . PAC (premature atrial contraction) 05/20/2011    Priority: Low  . Anemia 08/16/2017  . Coughing 08/12/2015  . Perennial allergic rhinitis 08/12/2015    Medications- reviewed and updated Current Outpatient Medications  Medication Sig Dispense Refill  . ARMOUR THYROID 15 MG tablet TAKE 1 TABLET BY MOUTH EVERY DAY 90 tablet 1  . Azelastine HCl 0.15 % SOLN Use 1-2 sprays per nostril 2 times daily as needed 30 mL 5  . chlorthalidone (HYGROTON) 25 MG tablet TAKE 1 TABLET BY MOUTH ONCE DAILY 30 tablet 5  . clopidogrel (PLAVIX) 75 MG tablet TAKE 1 TABLET BY MOUTH DAILY AT 12:00 NOON 90 tablet 3  . COSOPT 22.3-6.8 MG/ML ophthalmic solution Place 1 drop into both eyes 2 (two) times daily. Reported on 06/21/2015  0  . hydrALAZINE (APRESOLINE) 10 MG tablet TAKE 1 TABLET BY MOUTH THREE TIMES DAILY. 90 tablet 0  . irbesartan (AVAPRO) 150 MG tablet TAKE 1 TABLET BY MOUTH TWICE DAILY 180 tablet 0  . LUMIGAN 0.01 % SOLN Place 1 drop into both eyes at bedtime.   0  . Omega-3 Fatty Acids (OMEGA 3  PO) Take 5 mLs by mouth daily at 3 pm. One teaspoon daily     No current facility-administered medications for this visit.     Objective: BP 136/86 (BP Location: Left Arm, Patient Position: Sitting, Cuff Size: Normal)   Pulse 76   Temp 98.5 F (36.9 C) (Oral)   Ht 5\' 3"  (1.6 m)   Wt 136 lb 9.6 oz (62 kg)   SpO2 98%   BMI 24.20 kg/m  Gen: NAD, resting comfortably I didn't hear any cough during visit today CV: RRR no murmurs rubs or gallops Lungs: CTAB no crackles, wheeze, rhonchi Abdomen: soft/nontender/nondistended/normal bowel sounds.  Ext: no edema Skin: warm, dry  Assessment/Plan:  Other notes: 1. Saw ENT and took reflux medicine for 2 weeks that she had taken prior and that didn't help either. Triggers includes sprays, spicy foods  Hypertension S: controlled on chlorthalidone 25mg , hydralazine 3x a day, irbesartan 150mg  twice a day  Last visit creatinine improved to high 40s after she increased her fluid intake.  BP Readings from Last 3 Encounters:  08/16/17 136/86  07/16/17 (!) 150/56  07/10/17 (!) 179/70  A/P: We discussed blood pressure goal of <140/90. Continue current meds: thrilled she is at goal today. She still wants to attribute her chronic cough to medicines but I stressed she needed these meds given stroke history and  numerous side effects on prior attempts  Anemia S:stool card - luckily negative-  Anemia was stable on recheck in late may. Has some mild fatigue  A/P: We discussed possibly iron every other day. Discussed Gi referral- she declines as long as stable with stool cards negative  With next bloodwork could consider ferritin, b12 if covered.    Future Appointments  Date Time Provider [place]  11/04/2017 10:00 AM [facility]   Return in about 4 months (around 12/16/2017) for follow up- or sooner if needed.  Return precautions advised.  Garret Reddish, MD

## 2017-08-19 ENCOUNTER — Other Ambulatory Visit: Payer: Self-pay | Admitting: Family Medicine

## 2017-08-19 DIAGNOSIS — I739 Peripheral vascular disease, unspecified: Secondary | ICD-10-CM

## 2017-08-23 DIAGNOSIS — N8189 Other female genital prolapse: Secondary | ICD-10-CM | POA: Diagnosis not present

## 2017-08-23 DIAGNOSIS — R1909 Other intra-abdominal and pelvic swelling, mass and lump: Secondary | ICD-10-CM | POA: Diagnosis not present

## 2017-08-23 DIAGNOSIS — Z01419 Encounter for gynecological examination (general) (routine) without abnormal findings: Secondary | ICD-10-CM | POA: Diagnosis not present

## 2017-09-09 ENCOUNTER — Other Ambulatory Visit: Payer: Self-pay | Admitting: Family Medicine

## 2017-10-04 DIAGNOSIS — H401130 Primary open-angle glaucoma, bilateral, stage unspecified: Secondary | ICD-10-CM | POA: Diagnosis not present

## 2017-10-04 DIAGNOSIS — H2512 Age-related nuclear cataract, left eye: Secondary | ICD-10-CM | POA: Diagnosis not present

## 2017-10-04 DIAGNOSIS — H25012 Cortical age-related cataract, left eye: Secondary | ICD-10-CM | POA: Diagnosis not present

## 2017-10-04 DIAGNOSIS — H25042 Posterior subcapsular polar age-related cataract, left eye: Secondary | ICD-10-CM | POA: Diagnosis not present

## 2017-10-06 ENCOUNTER — Other Ambulatory Visit: Payer: Self-pay | Admitting: Surgery

## 2017-10-06 DIAGNOSIS — R1902 Left upper quadrant abdominal swelling, mass and lump: Secondary | ICD-10-CM | POA: Diagnosis not present

## 2017-10-18 ENCOUNTER — Other Ambulatory Visit: Payer: Self-pay | Admitting: Family Medicine

## 2017-10-21 ENCOUNTER — Ambulatory Visit: Payer: Medicare Other

## 2017-11-02 DIAGNOSIS — H25812 Combined forms of age-related cataract, left eye: Secondary | ICD-10-CM | POA: Diagnosis not present

## 2017-11-02 DIAGNOSIS — H2512 Age-related nuclear cataract, left eye: Secondary | ICD-10-CM | POA: Diagnosis not present

## 2017-11-04 ENCOUNTER — Inpatient Hospital Stay (HOSPITAL_COMMUNITY): Admission: RE | Admit: 2017-11-04 | Payer: Medicare Other | Source: Ambulatory Visit

## 2017-11-09 HISTORY — PX: CATARACT EXTRACTION: SUR2

## 2017-11-17 NOTE — Progress Notes (Signed)
Subjective:   [name] is a 84 y.o. female who presents for Medicare Annual (Subsequent) preventive examination.  Reports health as fair Hx stroke;  Glaucoma OU  Last OV 08/16/2017   Diet A1c 6.2 in 10/2016 (ast OV; stool neg- hgb 11.2  )   Walk x 5 days a week will help your A1c  Breakfast oatmeal and 1/2 yogurt and a banana Bran muffin; egg Lunch grab something;  Humus and crackers Supper sometimes she cooks or eats out     Exercise Will start walking x 5 days a week   Health Maintenance Due  Topic Date Due  . TETANUS/TDAP  01/02/1945  . PNA vac Low Risk Adult (1 of 2 - PCV13) 01/01/1993  . INFLUENZA VACCINE  09/23/2017   States her dtr is a Restaurant manager, fast food - does not support vaccines  Declines today but may consider pneumonia and was educated  Declines flu and tdap   Mammogram 02/2017  Dexa 07/2009 -2.4- discussed calcium and vit D  Requested she take fosamax x 84 yo  Declines repeat study at this time          Objective:     Vitals: BP 130/82   Pulse 74   Temp 98.5 F (36.9 C)   Ht _0  (1.6 m)   Wt 135 lb 6 oz (61.4 kg)   SpO2 97%   BMI 23.98 kg/m   Body mass index is 23.98 kg/m.  Advanced Directives 11/18/2017 06/25/2016 10/11/2014 05/15/2013 05/15/2013 04/01/2011  Does Patient Have a Medical Advance Directive? Yes Yes No Patient has advance directive, copy not in chart Patient has advance directive, copy not in chart Patient has advance directive, copy not in chart  Type of Advance Directive - - - - - Living will  Does patient want to make changes to medical advance directive? - - - No change requested - -  Copy of [place] in Chart? - - - Copy requested from other (Comment) - Copy requested from family  Would patient like information on creating a medical advance directive? - - No - patient declined information - - -  Pre-existing out of facility DNR order (yellow form or pink MOST form) - - - - - No   Was many years ago will  have it completed again.   Tobacco Social History   Tobacco Use  Smoking Status Never Smoker  Smokeless Tobacco Never Used     Counseling given: Yes   Clinical Intake:     Past Medical History:  Diagnosis Date  . Arthritis   . Chicken pox   . Glaucoma of both eyes   . High cholesterol   . Hypertension    a. Normal renal arteries by duplex 2013.  [name] Hypothyroidism   . Migraine   . PAD (peripheral artery disease) ([place])    a. 04/2013: s/p PCI to occluded right popliteal artery   . PAF (paroxysmal atrial fibrillation) ([place])    a. s/p MAZE 2006. b. Event monitor 2013: NSR with PACs.  . Sinus bradycardia    a. 03/2011 during hospital admission - beta blocker stopped.  . Stroke [facility])    a. Pt reports 3-4 prior strokes without residual sx. b. CVA 2013 (presented with headache) - started on Plavix. Event monitor as outpt - no AF.   Past Surgical History:  Procedure Laterality Date  . CATARACT EXTRACTION Left 11/09/2017  . LOWER EXTREMITY ANGIOGRAM N/A 05/15/2013   Procedure: LOWER EXTREMITY ANGIOGRAM;  Surgeon: Lorretta Harp, MD;  Location: [facility] CATH LAB;  Service: Cardiovascular;  Laterality: N/A;  . MAZE  ~ 2006  . POPLITEAL ARTERY STENT  05/15/2013   Family History  Problem Relation Age of Onset  . Hypertension Mother   . Ulcers Father   . Cervical cancer Sister   . Throat cancer Brother   . Lung cancer Brother   . Cancer Brother   . Allergic rhinitis Neg Hx   . Angioedema Neg Hx   . Asthma Neg Hx   . Atopy Neg Hx   . Eczema Neg Hx   . Immunodeficiency Neg Hx   . Urticaria Neg Hx    Social History   Socioeconomic History  . Marital status: Married    Spouse name: Not on file  . Number of children: Not on file  . Years of education: Not on file  . Highest education level: Not on file  Occupational History  . Not on file  Social Needs  . Financial resource strain: Not on file  . Food insecurity:    Worry: Not on file    Inability: Not on file  .  Transportation needs:    Medical: Not on file    Non-medical: Not on file  Tobacco Use  . Smoking status: Never Smoker  . Smokeless tobacco: Never Used  Substance and Sexual Activity  . Alcohol use: No    Alcohol/week: 0.0 standard drinks  . Drug use: No  . Sexual activity: Never  Lifestyle  . Physical activity:    Days per week: Not on file    Minutes per session: Not on file  . Stress: Not on file  Relationships  . Social connections:    Talks on phone: Not on file    Gets together: Not on file    Attends religious service: Not on file    Active member of club or organization: Not on file    Attends meetings of clubs or organizations: Not on file    Relationship status: Not on file  Other Topics Concern  . Not on file  Social History Narrative   Married. Lives with husband. Married 65 years in 2016. [place] 22 years in 2016. 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 6 greatgrandkids.    Finished 10th grade.       Retired from working in church for [employer]          Outpatient Encounter Medications as of 11/18/2017  Medication Sig  . ARMOUR THYROID 15 MG tablet TAKE 1 TABLET BY MOUTH EVERY DAY  . chlorthalidone (HYGROTON) 25 MG tablet TAKE 1 TABLET BY MOUTH ONCE DAILY  . clopidogrel (PLAVIX) 75 MG tablet TAKE 1 TABLET BY MOUTH DAILY AT 12:00 NOON  . COSOPT 22.3-6.8 MG/ML ophthalmic solution Place 1 drop into both eyes 2 (two) times daily. Reported on 06/21/2015  . hydrALAZINE (APRESOLINE) 10 MG tablet TAKE 1 TABLET BY MOUTH THREE TIMES DAILY  . irbesartan (AVAPRO) 150 MG tablet TAKE 1 TABLET BY MOUTH TWICE DAILY  . LUMIGAN 0.01 % SOLN Place 1 drop into both eyes at bedtime.   . Omega-3 Fatty Acids (OMEGA 3 PO) Take 5 mLs by mouth daily at 3 pm. One teaspoon daily  . Azelastine HCl 0.15 % SOLN Use 1-2 sprays per nostril 2 times daily as needed (Patient not taking: Reported on 11/18/2017)   No facility-administered encounter medications  on file as of 11/18/2017.     Activities  of Daily Living In your present state of health, do you have any difficulty performing the following activities: 11/18/2017  Hearing? N  Vision? N  Difficulty concentrating or making decisions? N  Walking or climbing stairs? N  Dressing or bathing? N  Doing errands, shopping? N  Some recent data might be hidden    Patient Care Team: [name], MD as PCP - General (Family Medicine) Burnell Blanks, MD as PCP - Cardiology (Cardiology) Lorretta Harp, MD as Consulting Physician (Peripheral Vascular Disease)    Assessment:   This is a routine wellness examination for Anja.  Exercise Activities and Dietary recommendations    Goals    . Exercise 150 min/wk Moderate Activity     Wants to walk again. 5 days a week will help Will start back to Tai chi which will help your balance     . Exercise 150 minutes per week (moderate activity)     Will check out the senior center  [address] [place], [place] [address] Phone: [phone]  Senior center [place], [place] [address] Directions  [phone]        Fall Risk Fall Risk  07/16/2017 06/25/2016 06/21/2015 06/07/2014  Falls in the past year? No No No No   Depression Screen PHQ 2/9 Scores 11/18/2017 07/16/2017 06/25/2016 06/21/2015  PHQ - 2 Score 0 1 0 0     Cognitive Function MMSE - Mini Mental State Exam 06/25/2016  Not completed: (No Data)     Ad8 score reviewed for issues:  Issues making decisions:  Less interest in hobbies / activities:  Repeats questions, stories (family complaining):  Trouble using ordinary gadgets (microwave, computer, phone):  Forgets the month or year:   Mismanaging finances:   Remembering appts:  Daily problems with thinking and/or memory: Ad8 score is=         There is no immunization history on file for this patient.    Screening Tests Health Maintenance  Topic Date Due  . TETANUS/TDAP   01/02/1945  . PNA vac Low Risk Adult (1 of 2 - PCV13) 01/01/1993  . INFLUENZA VACCINE  09/23/2017  . DEXA SCAN  Completed        Plan:      PCP Notes   Health Maintenance  States her dtr is a Restaurant manager, fast food - does not support vaccines  Declines today but may consider pneumonia and was educated   Mammogram 02/2017  Dexa 07/2009 -2.4- discussed calcium and vit D  Requested she take fosamax x 84 yo  Declines repeat study at this time    Abnormal Screens  None  Referrals  none  Patient concerns; New c/o  After eating, (chinese) jerks her whole body and heart is pounding. Last one was last Saturday;checked her BP during one episode it was 200. C/o of gas and took tums and then felt better.  Does point to mid chest.  Also has a gagging cough at times, not sure if asso with eating     Nurse Concerns; As noted   Next PCP apt Is going to make an apt 12/09/2017      I have personally reviewed and noted the following in the patient's chart:   . Medical and social history . Use of alcohol, tobacco or illicit drugs  . Current medications and supplements . Functional ability and status . Nutritional status . Physical activity . Advanced directives . List of other physicians . Hospitalizations, surgeries, and ER visits in previous  12 months . Vitals . Screenings to include cognitive, depression, and falls . Referrals and appointments  In addition, I have reviewed and discussed with patient certain preventive protocols, quality metrics, and best practice recommendations. A written personalized care plan for preventive services as well as general preventive health recommendations were provided to patient.     Wynetta Fines, RN  11/18/2017

## 2017-11-18 ENCOUNTER — Ambulatory Visit (INDEPENDENT_AMBULATORY_CARE_PROVIDER_SITE_OTHER): Payer: Medicare Other

## 2017-11-18 VITALS — BP 130/82 | HR 74 | Temp 98.5°F | Ht 63.0 in | Wt 135.4 lb

## 2017-11-18 DIAGNOSIS — Z0000 Encounter for general adult medical examination without abnormal findings: Secondary | ICD-10-CM | POA: Diagnosis not present

## 2017-11-18 NOTE — Patient Instructions (Addendum)
[name] , Thank you for taking time to come for your Medicare Wellness Visit. I appreciate your ongoing commitment to your health goals. Please review the following plan we discussed and let me know if I can assist you in the future.   To make an apt with Dr. Yong Channel to discuss your 'shaking" after eating certain foods   Keep in mind the flu shot is an inactivated vaccine and takes at least 2 weeks to build immunity. The flu virus can be dormant for 4 days prior to symptoms Taking the flu shot at the beginning of the season can reduce the risk for the entire community.    The Centers for Disease Control are now recommending 2 pneumonia vaccinations after 77. The first is the Prevnar 13. This helps to boost your immunity to community acquired pneumonia as well as some protection from bacterial pneumonia  The 2nd is the pneumovax 23, which offers more broad protection!  Please consider taking these as this is your best protection against pneumonia.   These are the goals we discussed: Goals    . Exercise 150 minutes per week (moderate activity)     Will check out the senior center  [address] [place], [place] [address] Phone: [phone]  [facility] [place], [place] [address] Directions  [phone]        This is a list of the screening recommended for you and due dates:  Health Maintenance  Topic Date Due  . Tetanus Vaccine  01/02/1945  . Pneumonia vaccines (1 of 2 - PCV13) 01/01/1993  . Flu Shot  09/23/2017  . DEXA scan (bone density measurement)  Completed     Health Maintenance, Female Adopting a healthy lifestyle and getting preventive care can go a long way to promote health and wellness. Talk with your health care provider about what schedule of regular examinations is right for you. This is a good chance for you to check in with your provider about disease prevention and staying healthy. In between checkups, there are plenty of things you  can do on your own. Experts have done a lot of research about which lifestyle changes and preventive measures are most likely to keep you healthy. Ask your health care provider for more information. Weight and diet Eat a healthy diet  Be sure to include plenty of vegetables, fruits, low-fat dairy products, and lean protein.  Do not eat a lot of foods high in solid fats, added sugars, or salt.  Get regular exercise. This is one of the most important things you can do for your health. ? Most adults should exercise for at least 150 minutes each week. The exercise should increase your heart rate and make you sweat (moderate-intensity exercise). ? Most adults should also do strengthening exercises at least twice a week. This is in addition to the moderate-intensity exercise.  Maintain a healthy weight  Body mass index (BMI) is a measurement that can be used to identify possible weight problems. It estimates body fat based on height and weight. Your health care provider can help determine your BMI and help you achieve or maintain a healthy weight.  For females 66 years of age and older: ? A BMI below 18.5 is considered underweight. ? A BMI of 18.5 to 24.9 is normal. ? A BMI of 25 to 29.9 is considered overweight. ? A BMI of 30 and above is considered obese.  Watch levels of cholesterol and blood lipids  You should start  having your blood tested for lipids and cholesterol at 82 years of age, then have this test every 5 years.  You may need to have your cholesterol levels checked more often if: ? Your lipid or cholesterol levels are high. ? You are older than 82 years of age. ? You are at high risk for heart disease.  Cancer screening Lung Cancer  Lung cancer screening is recommended for adults 70-74 years old who are at high risk for lung cancer because of a history of smoking.  A yearly low-dose CT scan of the lungs is recommended for people who: ? Currently smoke. ? Have quit within  the past 15 years. ? Have at least a 30-pack-year history of smoking. A pack year is smoking an average of one pack of cigarettes a day for 1 year.  Yearly screening should continue until it has been 15 years since you quit.  Yearly screening should stop if you develop a health problem that would prevent you from having lung cancer treatment.  Breast Cancer  Practice breast self-awareness. This means understanding how your breasts normally appear and feel.  It also means doing regular breast self-exams. Let your health care provider know about any changes, no matter how small.  If you are in your 20s or 30s, you should have a clinical breast exam (CBE) by a health care provider every 1-3 years as part of a regular health exam.  If you are 61 or older, have a CBE every year. Also consider having a breast X-ray (mammogram) every year.  If you have a family history of breast cancer, talk to your health care provider about genetic screening.  If you are at high risk for breast cancer, talk to your health care provider about having an MRI and a mammogram every year.  Breast cancer gene (BRCA) assessment is recommended for women who have family members with BRCA-related cancers. BRCA-related cancers include: ? Breast. ? Ovarian. ? Tubal. ? Peritoneal cancers.  Results of the assessment will determine the need for genetic counseling and BRCA1 and BRCA2 testing.  Cervical Cancer Your health care provider may recommend that you be screened regularly for cancer of the pelvic organs (ovaries, uterus, and vagina). This screening involves a pelvic examination, including checking for microscopic changes to the surface of your cervix (Pap test). You may be encouraged to have this screening done every 3 years, beginning at age 52.  For women ages 76-65, health care providers may recommend pelvic exams and Pap testing every 3 years, or they may recommend the Pap and pelvic exam, combined with testing  for human papilloma virus (HPV), every 5 years. Some types of HPV increase your risk of cervical cancer. Testing for HPV may also be done on women of any age with unclear Pap test results.  Other health care providers may not recommend any screening for nonpregnant women who are considered low risk for pelvic cancer and who do not have symptoms. Ask your health care provider if a screening pelvic exam is right for you.  If you have had past treatment for cervical cancer or a condition that could lead to cancer, you need Pap tests and screening for cancer for at least 20 years after your treatment. If Pap tests have been discontinued, your risk factors (such as having a new sexual partner) need to be reassessed to determine if screening should resume. Some women have medical problems that increase the chance of getting cervical cancer. In these cases, your health  care provider may recommend more frequent screening and Pap tests.  Colorectal Cancer  This type of cancer can be detected and often prevented.  Routine colorectal cancer screening usually begins at 82 years of age and continues through 82 years of age.  Your health care provider may recommend screening at an earlier age if you have risk factors for colon cancer.  Your health care provider may also recommend using home test kits to check for hidden blood in the stool.  A small camera at the end of a tube can be used to examine your colon directly (sigmoidoscopy or colonoscopy). This is done to check for the earliest forms of colorectal cancer.  Routine screening usually begins at age 59.  Direct examination of the colon should be repeated every 5-10 years through 82 years of age. However, you may need to be screened more often if early forms of precancerous polyps or small growths are found.  Skin Cancer  Check your skin from head to toe regularly.  Tell your health care provider about any new moles or changes in moles, especially  if there is a change in a mole's shape or color.  Also tell your health care provider if you have a mole that is larger than the size of a pencil eraser.  Always use sunscreen. Apply sunscreen liberally and repeatedly throughout the day.  Protect yourself by wearing long sleeves, pants, a wide-brimmed hat, and sunglasses whenever you are outside.  Heart disease, diabetes, and high blood pressure  High blood pressure causes heart disease and increases the risk of stroke. High blood pressure is more likely to develop in: ? People who have blood pressure in the high end of the normal range (130-139/85-89 mm Hg). ? People who are overweight or obese. ? People who are African American.  If you are 49-84 years of age, have your blood pressure checked every 3-5 years. If you are 26 years of age or older, have your blood pressure checked every year. You should have your blood pressure measured twice-once when you are at a hospital or clinic, and once when you are not at a hospital or clinic. Record the average of the two measurements. To check your blood pressure when you are not at a hospital or clinic, you can use: ? An automated blood pressure machine at a pharmacy. ? A home blood pressure monitor.  If you are between 76 years and 44 years old, ask your health care provider if you should take aspirin to prevent strokes.  Have regular diabetes screenings. This involves taking a blood sample to check your fasting blood sugar level. ? If you are at a normal weight and have a low risk for diabetes, have this test once every three years after 82 years of age. ? If you are overweight and have a high risk for diabetes, consider being tested at a younger age or more often. Preventing infection Hepatitis B  If you have a higher risk for hepatitis B, you should be screened for this virus. You are considered at high risk for hepatitis B if: ? You were born in a country where hepatitis B is common. Ask  your health care provider which countries are considered high risk. ? Your parents were born in a high-risk country, and you have not been immunized against hepatitis B (hepatitis B vaccine). ? You have HIV or AIDS. ? You use needles to inject street drugs. ? You live with someone who has hepatitis B. ?  You have had sex with someone who has hepatitis B. ? You get hemodialysis treatment. ? You take certain medicines for conditions, including cancer, organ transplantation, and autoimmune conditions.  Hepatitis C  Blood testing is recommended for: ? Everyone born from [birth year] through 1963. ? Anyone with known risk factors for hepatitis C.  Sexually transmitted infections (STIs)  You should be screened for sexually transmitted infections (STIs) including gonorrhea and chlamydia if: ? You are sexually active and are younger than 82 years of age. ? You are older than 82 years of age and your health care provider tells you that you are at risk for this type of infection. ? Your sexual activity has changed since you were last screened and you are at an increased risk for chlamydia or gonorrhea. Ask your health care provider if you are at risk.  If you do not have HIV, but are at risk, it may be recommended that you take a prescription medicine daily to prevent HIV infection. This is called pre-exposure prophylaxis (PrEP). You are considered at risk if: ? You are sexually active and do not regularly use condoms or know the HIV status of your partner(s). ? You take drugs by injection. ? You are sexually active with a partner who has HIV.  Talk with your health care provider about whether you are at high risk of being infected with HIV. If you choose to begin PrEP, you should first be tested for HIV. You should then be tested every 3 months for as long as you are taking PrEP. Pregnancy  If you are premenopausal and you may become pregnant, ask your health care provider about preconception  counseling.  If you may become pregnant, take 400 to 800 micrograms (mcg) of folic acid every day.  If you want to prevent pregnancy, talk to your health care provider about birth control (contraception). Osteoporosis and menopause  Osteoporosis is a disease in which the bones lose minerals and strength with aging. This can result in serious bone fractures. Your risk for osteoporosis can be identified using a bone density scan.  If you are 1 years of age or older, or if you are at risk for osteoporosis and fractures, ask your health care provider if you should be screened.  Ask your health care provider whether you should take a calcium or vitamin D supplement to lower your risk for osteoporosis.  Menopause may have certain physical symptoms and risks.  Hormone replacement therapy may reduce some of these symptoms and risks. Talk to your health care provider about whether hormone replacement therapy is right for you. Follow these instructions at home:  Schedule regular health, dental, and eye exams.  Stay current with your immunizations.  Do not use any tobacco products including cigarettes, chewing tobacco, or electronic cigarettes.  If you are pregnant, do not drink alcohol.  If you are breastfeeding, limit how much and how often you drink alcohol.  Limit alcohol intake to no more than 1 drink per day for nonpregnant women. One drink equals 12 ounces of beer, 5 ounces of wine, or 1 ounces of hard liquor.  Do not use street drugs.  Do not share needles.  Ask your health care provider for help if you need support or information about quitting drugs.  Tell your health care provider if you often feel depressed.  Tell your health care provider if you have ever been abused or do not feel safe at home. This information is not intended to replace advice  given to you by your health care provider. Make sure you discuss any questions you have with your health care provider. Document  Released: 08/25/2010 Document Revised: 07/18/2015 Document Reviewed: 11/13/2014 Elsevier Interactive Patient Education  2018 [place] A mammogram is an X-ray of the breasts that is done to check for abnormal changes. This procedure can screen for and detect any changes that may suggest breast cancer. A mammogram can also identify other changes and variations in the breast, such as:  Inflammation of the breast tissue (mastitis).  An infected area that contains a collection of pus (abscess).  A fluid-filled sac (cyst).  Fibrocystic changes. This is when breast tissue becomes denser, which can make the tissue feel rope-like or uneven under the skin.  Tumors that are not cancerous (benign).  Tell a health care provider about:  Any allergies you have.  If you have breast implants.  If you have had previous breast disease, biopsy, or surgery.  If you are breastfeeding.  Any possibility that you could be pregnant, if this applies.  If you are younger than age 74.  If you have a family history of breast cancer. What are the risks? Generally, this is a safe procedure. However, problems may occur, including:  Exposure to radiation. Radiation levels are very low with this test.  The results being misinterpreted.  The need for further tests.  The inability of the mammogram to detect certain cancers.  What happens before the procedure?  Schedule your test about 1-2 weeks after your menstrual period. This is usually when your breasts are the least tender.  If you have had a mammogram done at a different facility in the past, get the mammogram X-rays or have them sent to your current exam facility in order to compare them.  Wash your breasts and under your arms the day of the test.  Do not wear deodorants, perfumes, lotions, or powders anywhere on your body on the day of the test.  Remove any jewelry from your neck.  Wear clothes that you can change into and  out of easily. What happens during the procedure?  You will undress from the waist up and put on a gown.  You will stand in front of the X-ray machine.  Each breast will be placed between two plastic or glass plates. The plates will compress your breast for a few seconds. Try to stay as relaxed as possible during the procedure. This does not cause any harm to your breasts and any discomfort you feel will be very brief.  X-rays will be taken from different angles of each breast. The procedure may vary among health care providers and hospitals. What happens after the procedure?  The mammogram will be examined by a specialist (radiologist).  You may need to repeat certain parts of the test, depending on the quality of the images. This is commonly done if the radiologist needs a better view of the breast tissue.  Ask when your test results will be ready. Make sure you get your test results.  You may resume your normal activities. This information is not intended to replace advice given to you by your health care provider. Make sure you discuss any questions you have with your health care provider. Document Released: 02/07/2000 Document Revised: 07/15/2015 Document Reviewed: 04/20/2014 Elsevier Interactive Patient Education  [employer].

## 2017-11-18 NOTE — Progress Notes (Signed)
I have reviewed and agree with note, evaluation, plan. I advised sooner follow up for elevated BP, gas issues but patient preferred later visit. She should be seen for new or worsening symptoms.   Garret Reddish, MD

## 2017-11-29 ENCOUNTER — Ambulatory Visit (HOSPITAL_COMMUNITY)
Admission: RE | Admit: 2017-11-29 | Discharge: 2017-11-29 | Disposition: A | Discharge status: Home / Self Care | Payer: Medicare Other | Source: Ambulatory Visit | Attending: Cardiology | Admitting: Cardiology

## 2017-11-29 DIAGNOSIS — I998 Other disorder of circulatory system: Secondary | ICD-10-CM

## 2017-11-29 DIAGNOSIS — I70229 Atherosclerosis of native arteries of extremities with rest pain, unspecified extremity: Secondary | ICD-10-CM

## 2017-12-06 ENCOUNTER — Other Ambulatory Visit: Payer: Self-pay | Admitting: *Deleted

## 2017-12-06 DIAGNOSIS — I739 Peripheral vascular disease, unspecified: Secondary | ICD-10-CM

## 2017-12-09 ENCOUNTER — Encounter: Payer: Self-pay | Admitting: Family Medicine

## 2017-12-09 ENCOUNTER — Ambulatory Visit (INDEPENDENT_AMBULATORY_CARE_PROVIDER_SITE_OTHER): Payer: Medicare Other | Admitting: Family Medicine

## 2017-12-09 VITALS — BP 148/70 | HR 48 | Temp 97.8°F | Resp 16 | Wt 135.0 lb

## 2017-12-09 DIAGNOSIS — I10 Essential (primary) hypertension: Secondary | ICD-10-CM | POA: Diagnosis not present

## 2017-12-09 DIAGNOSIS — I739 Peripheral vascular disease, unspecified: Secondary | ICD-10-CM

## 2017-12-09 DIAGNOSIS — E538 Deficiency of other specified B group vitamins: Secondary | ICD-10-CM | POA: Diagnosis not present

## 2017-12-09 DIAGNOSIS — D649 Anemia, unspecified: Secondary | ICD-10-CM

## 2017-12-09 DIAGNOSIS — K219 Gastro-esophageal reflux disease without esophagitis: Secondary | ICD-10-CM

## 2017-12-09 DIAGNOSIS — I480 Paroxysmal atrial fibrillation: Secondary | ICD-10-CM

## 2017-12-09 DIAGNOSIS — R7989 Other specified abnormal findings of blood chemistry: Secondary | ICD-10-CM

## 2017-12-09 DIAGNOSIS — E039 Hypothyroidism, unspecified: Secondary | ICD-10-CM

## 2017-12-09 LAB — CBC WITH DIFFERENTIAL/PLATELET
BASOS ABS: 0 10*3/uL (ref 0.0–0.1)
Basophils Relative: 0.4 % (ref 0.0–3.0)
Eosinophils Absolute: 0.1 10*3/uL (ref 0.0–0.7)
Eosinophils Relative: 2.6 % (ref 0.0–5.0)
HCT: 35.7 % — ABNORMAL LOW (ref 36.0–46.0)
Hemoglobin: 11.6 g/dL — ABNORMAL LOW (ref 12.0–15.0)
LYMPHS ABS: 1 10*3/uL (ref 0.7–4.0)
LYMPHS PCT: 27.4 % (ref 12.0–46.0)
MCHC: 32.5 g/dL (ref 30.0–36.0)
MCV: 82.3 fl (ref 78.0–100.0)
MONOS PCT: 11.6 % (ref 3.0–12.0)
Monocytes Absolute: 0.4 10*3/uL (ref 0.1–1.0)
NEUTROS PCT: 58 % (ref 43.0–77.0)
Neutro Abs: 2.2 10*3/uL (ref 1.4–7.7)
Platelets: 193 10*3/uL (ref 150.0–400.0)
RBC: 4.34 Mil/uL (ref 3.87–5.11)
RDW: 14.5 % (ref 11.5–15.5)
WBC: 3.8 10*3/uL — ABNORMAL LOW (ref 4.0–10.5)

## 2017-12-09 LAB — VITAMIN B12: Vitamin B-12: 374 pg/mL (ref 211–911)

## 2017-12-09 LAB — COMPREHENSIVE METABOLIC PANEL
ALK PHOS: 61 U/L (ref 39–117)
ALT: 10 U/L (ref 0–35)
AST: 14 U/L (ref 0–37)
Albumin: 4.3 g/dL (ref 3.5–5.2)
BILIRUBIN TOTAL: 0.4 mg/dL (ref 0.2–1.2)
BUN: 36 mg/dL — AB (ref 6–23)
CO2: 26 meq/L (ref 19–32)
Calcium: 9.4 mg/dL (ref 8.4–10.5)
Chloride: 103 mEq/L (ref 96–112)
Creatinine, Ser: 1.35 mg/dL — ABNORMAL HIGH (ref 0.40–1.20)
GFR: 47.39 mL/min — ABNORMAL LOW (ref >60.00)
GLUCOSE: 93 mg/dL (ref 70–99)
Potassium: 4.2 mEq/L (ref 3.5–5.1)
SODIUM: 138 meq/L (ref 135–145)
TOTAL PROTEIN: 7.5 g/dL (ref 6.0–8.3)

## 2017-12-09 LAB — TSH: TSH: 3.17 u[IU]/mL (ref 0.35–4.50)

## 2017-12-09 MED ORDER — FAMOTIDINE 20 MG PO TABS: 20.0000 mg | ORAL_TABLET | Freq: Two times a day (BID) | ORAL | 2 refills | Status: DC

## 2017-12-09 NOTE — Progress Notes (Addendum)
Subjective:  [name] is a 84 y.o. year old very pleasant female patient who presents for/with See problem oriented charting ROS- mild cough, has felt gassy, no chest pain or shortness of breath, some palpitations. No edema.    Past Medical History-  Patient Active Problem List   Diagnosis Date Noted  . Critical lower limb ischemia/PAD 05/10/2013    Priority: High  . PAF (paroxysmal atrial fibrillation) (HCC)     Priority: High  . Stroke [facility])     Priority: High  . Hypothyroidism 06/07/2014    Priority: Medium  . Hyperlipidemia 06/07/2014    Priority: Medium  . Hypertension     Priority: Medium  . GERD (gastroesophageal reflux disease) 06/25/2014    Priority: Low  . Hyperglycemia 06/25/2014    Priority: Low  . Vitamin D deficiency 06/25/2014    Priority: Low  . Intracervical pessary 06/07/2014    Priority: Low  . Sinus bradycardia 05/19/2013    Priority: Low  . PAC (premature atrial contraction) 05/20/2011    Priority: Low  . Anemia 08/16/2017  . Coughing 08/12/2015  . Perennial allergic rhinitis 08/12/2015    Medications- reviewed and updated Current Outpatient Medications  Medication Sig Dispense Refill  . ARMOUR THYROID 15 MG tablet TAKE 1 TABLET BY MOUTH EVERY DAY 90 tablet 1  . Azelastine HCl 0.15 % SOLN Use 1-2 sprays per nostril 2 times daily as needed (Patient not taking: Reported on 11/18/2017) 30 mL 5  . chlorthalidone (HYGROTON) 25 MG tablet TAKE 1 TABLET BY MOUTH ONCE DAILY 30 tablet 2  . clopidogrel (PLAVIX) 75 MG tablet TAKE 1 TABLET BY MOUTH DAILY AT 12:00 NOON 90 tablet 1  . COSOPT 22.3-6.8 MG/ML ophthalmic solution Place 1 drop into both eyes 2 (two) times daily. Reported on 06/21/2015  0  . famotidine (PEPCID) 20 MG tablet Take 1 tablet (20 mg total) by mouth 2 (two) times daily. 60 tablet 2  . hydrALAZINE (APRESOLINE) 10 MG tablet TAKE 1 TABLET BY MOUTH THREE TIMES DAILY 90 tablet 1  . irbesartan (AVAPRO) 150 MG tablet TAKE 1 TABLET BY MOUTH TWICE  DAILY 180 tablet 1  . LUMIGAN 0.01 % SOLN Place 1 drop into both eyes at bedtime.   0  . Omega-3 Fatty Acids (OMEGA 3 PO) Take 5 mLs by mouth daily at 3 pm. One teaspoon daily     No current facility-administered medications for this visit.    Objective: BP (!) 148/70   Pulse (!) 48   Temp 97.8 F (36.6 C) (Oral)   Resp 16   Wt 135 lb (61.2 kg)   SpO2 98%   BMI 23.91 kg/m  Gen: NAD, resting comfortably CV: Bradycardic in 50s but regular no murmurs rubs or gallops Lungs: CTAB no crackles, wheeze, rhonchi Abdomen: soft/nontender/nondistended/normal bowel sounds.  Ext: no edema Skin: warm, dry Neuro: speech  normal, moves all extremities  Assessment/Plan:   GERD (gastroesophageal reflux disease) S: continues to have issues with gas- seems like this causes palpitations for an hour. Watching out for spicy food. Takes baking soda or tums and it seems to help.  Happened 3x last week- usually after dinner which is largest meal of the day. Nothing this week. Seems to be better with small portions and eating more slowly. This seemed to start sometime after stopping zantac (her cough has been better so stopped) but starting to get some cough again A/P: I suspect this could be related to your reflux. Lets have you try  pepcid/famotidine twice a day before breakfast and dinner. If issues resolve within a week or two or stay away, lets try once a day before dinner.   Hypertension S: controlled at home in 120s or 130s. Mild poorly controlled  on chlorthalidone 25 mg, hydralazine 3 times a day, irbesartan 150 mg twice a day.  Her last bmet showed no more into the 40s for GFR after increasing fluid intake. Sister in law did just pass which has increased stress  She remains on Plavix given history of stroke BP Readings from Last 3 Encounters:  12/09/17 (!) 148/70  11/18/17 130/82  08/16/17 136/86  A/P: We discussed blood pressure goal of <140/90. Continue current meds- slight elevation today but  good home control- will not make adjustments unless home #s high and high here as she has had intolerance to most BP meds.  Recheck in 2 months along with GERD symptoms   Anemia S: mild anemia since 2015- not worsening. Also slight wbc decrease. She reports low b12 years ago but no recent checks Lab Results  Component Value Date   WBC 3.1 (L) 07/20/2017   HGB 11.2 (L) 07/20/2017   HCT 33.1 (L) 07/20/2017   MCV 81.2 07/20/2017   PLT 175.0 07/20/2017  A/P: update cbc, Ferritin, b12.   Hypothyroidism S: On thyroid medication-Armour Thyroid 15 mg Lab Results  Component Value Date   TSH 3.17 12/09/2017   A/P: Update TSH today  PAD (peripheral artery disease) ([place]) S: patient has done well since stenting back in 2015 with Dr. Gwenlyn Found. Compliant with aspirin. BP controlled. Lipids poorly controlled.  A/P: looks like last visit with Dr. Gwenlyn Found was in 2018- sees Dr. Angelena Form in November- would ask his opinion on PCSK9 inhibitor given prior statin intolerance  PAF (paroxysmal atrial fibrillation) Patient has had some palpitations when she gets gassy- she wonders if this is recurrence of atrial fibrillation which she had not had previously since Maze procedure in 2006.  Cardiology has thought palpitations were premature beats and not atrial fibrillation-if her symptoms persist she can discuss with cardiology when she sees them in November.  She is only on antiplatelet Plavix and not on anticoagulant- this plan likely would change if a fib recurrence confirmed.   Future Appointments  Date Time Provider [place]  01/13/2018  2:40 PM Burnell Blanks, MD [facility]  02/07/2018  2:15 PM [name], MD [facility]   Lab/Order associations: Essential hypertension - Plan: Comprehensive metabolic panel, CBC with Differential/Platelet  Anemia, unspecified type - Plan: Iron, TIBC and Ferritin Panel, Vitamin B12  Hypothyroidism, unspecified type - Plan: TSH  Low  vitamin B12 level - Plan: Vitamin B12  Gastroesophageal reflux disease, esophagitis presence not specified  Meds ordered this encounter  Medications  . famotidine (PEPCID) 20 MG tablet    Sig: Take 1 tablet (20 mg total) by mouth 2 (two) times daily.    Dispense:  60 tablet    Refill:  2    Return precautions advised.  Garret Reddish, MD

## 2017-12-09 NOTE — Assessment & Plan Note (Signed)
Patient has had some palpitations when she gets gassy- she wonders if this is recurrence of atrial fibrillation which she had not had previously since Maze procedure in 2006.  Cardiology has thought palpitations were premature beats and not atrial fibrillation-if her symptoms persist she can discuss with cardiology when she sees them in November.  She is only on antiplatelet Plavix and not on anticoagulant- this plan likely would change if a fib recurrence confirmed.

## 2017-12-09 NOTE — Assessment & Plan Note (Signed)
S: controlled at home in 120s or 130s. Mild poorly controlled  on chlorthalidone 25 mg, hydralazine 3 times a day, irbesartan 150 mg twice a day.  Her last bmet showed no more into the 40s for GFR after increasing fluid intake. Sister in law did just pass which has increased stress  She remains on Plavix given history of stroke BP Readings from Last 3 Encounters:  12/09/17 (!) 148/70  11/18/17 130/82  08/16/17 136/86  A/P: We discussed blood pressure goal of <140/90. Continue current meds- slight elevation today but good home control- will not make adjustments unless home #s high and high here as she has had intolerance to most BP meds.  Recheck in 2 months along with GERD symptoms

## 2017-12-09 NOTE — Assessment & Plan Note (Signed)
S: mild anemia since 2015- not worsening. Also slight wbc decrease. She reports low b12 years ago but no recent checks Lab Results  Component Value Date   WBC 3.1 (L) 07/20/2017   HGB 11.2 (L) 07/20/2017   HCT 33.1 (L) 07/20/2017   MCV 81.2 07/20/2017   PLT 175.0 07/20/2017  A/P: update cbc, Ferritin, b12.

## 2017-12-09 NOTE — Assessment & Plan Note (Signed)
S: patient has done well since stenting back in 2015 with Dr. Gwenlyn Found. Compliant with aspirin. BP controlled. Lipids poorly controlled.  A/P: looks like last visit with Dr. Gwenlyn Found was in 2018- sees Dr. Angelena Form in November- would ask his opinion on PCSK9 inhibitor given prior statin intolerance

## 2017-12-09 NOTE — Assessment & Plan Note (Signed)
S: On thyroid medication-Armour Thyroid 15 mg Lab Results  Component Value Date   TSH 3.17 12/09/2017   A/P: Update TSH today

## 2017-12-09 NOTE — Patient Instructions (Addendum)
Declined flu and pneumonia shot for now. Can get tetanus shot at pharmacy when feeling better.   I suspect this could be related to your reflux. Lets have you try pepcid/famotidine twice a day before breakfast and dinner. If issues resolve within a week or two or stay away, lets try once a day before dinner.   2 month follow up to check on blood pressure and hopefully give flu and pneumonia shot   Please stop by lab before you go

## 2017-12-09 NOTE — Assessment & Plan Note (Addendum)
S: continues to have issues with gas- seems like this causes palpitations for an hour. Watching out for spicy food. Takes baking soda or tums and it seems to help.  Happened 3x last week- usually after dinner which is largest meal of the day. Nothing this week. Seems to be better with small portions and eating more slowly. This seemed to start sometime after stopping zantac (her cough has been better so stopped) but starting to get some cough again A/P: I suspect this could be related to your reflux. Lets have you try pepcid/famotidine twice a day before breakfast and dinner. If issues resolve within a week or two or stay away, lets try once a day before dinner.

## 2017-12-10 LAB — IRON,TIBC AND FERRITIN PANEL
%SAT: 29 % (calc) (ref 16–45)
FERRITIN: 107 ng/mL (ref 16–288)
Iron: 93 ug/dL (ref 45–160)
TIBC: 326 mcg/dL (calc) (ref 250–450)

## 2017-12-16 ENCOUNTER — Ambulatory Visit: Payer: Medicare Other | Admitting: Family Medicine

## 2018-01-13 ENCOUNTER — Encounter: Payer: Self-pay | Admitting: Cardiovascular Disease

## 2018-01-13 ENCOUNTER — Ambulatory Visit (INDEPENDENT_AMBULATORY_CARE_PROVIDER_SITE_OTHER): Payer: Medicare Other | Admitting: Cardiovascular Disease

## 2018-01-13 VITALS — BP 178/60 | HR 48 | Ht 63.0 in | Wt 134.8 lb

## 2018-01-13 DIAGNOSIS — I10 Essential (primary) hypertension: Secondary | ICD-10-CM

## 2018-01-13 DIAGNOSIS — I491 Atrial premature depolarization: Secondary | ICD-10-CM

## 2018-01-13 DIAGNOSIS — I739 Peripheral vascular disease, unspecified: Secondary | ICD-10-CM

## 2018-01-13 NOTE — Progress Notes (Signed)
Chief Complaint  Patient presents with  . Follow-up    HTN   History of Present Illness: 84 yo female with history of HTN, CVA, PAF s/p MAZE 2006, PAD who is here today for cardiac follow up. I have followed her for atrial fibrillation and HTN. Her HTN has been difficult to control over the years. She has not tolerated many medications. She has most recently tolerated Avapro, chlorthalidone and hydralazine well. She has not tolerated Norvasc, Coreg, Bystolic or clonidine. I met her at [facility] in February 2013 when she was admitted with a headache and found to have sinus brady with elevated BP. She had initially been tried on Bystolic but did not tolerate due to bradycardia. She was started on Norvasc in the hospital but had burning in her legs so she stopped it. Echo 04/02/11 with normal LV size and function. Event monitor in 2013 showed NSR with PACs. No evidence of atrial fib. Renal artery dopplers November 2013 without evidence of renal artery stenosis. She has PAD followed by Dr. Gwenlyn Found and underwent successful balloon/stenting of an occluded right popliteal artery per Dr. Gwenlyn Found in 2015. Her PAD was stable at last check with Dr. Gwenlyn Found in January 2019. Echo June 2015 at [facility] with normal LV function. Cardiac monitor October 2017 with PVCs and PACs, no atrial fib.   She is here today for follow up. The patient denies any chest pain, dyspnea, lower extremity edema, orthopnea, PND, dizziness, near syncope or syncope. She has palpitations that occur after eating a heavy meal. There is burning in her chest that resolves with belching. The palpitations feel like her atrial fib did in the past. She has a history of PVCs and PACs.   Primary Care Physician:  [name], MD  Past Medical History:  Diagnosis Date  . Arthritis   . Chicken pox   . Glaucoma of both eyes   . High cholesterol   . Hypertension    a. Normal renal arteries by duplex 2013.  [name] Hypothyroidism   . Migraine     . PAD (peripheral artery disease) ([place])    a. 04/2013: s/p PCI to occluded right popliteal artery   . PAF (paroxysmal atrial fibrillation) ([place])    a. s/p MAZE 2006. b. Event monitor 2013: NSR with PACs.  . Sinus bradycardia    a. 03/2011 during hospital admission - beta blocker stopped.  . Stroke [facility])    a. Pt reports 3-4 prior strokes without residual sx. b. CVA 2013 (presented with headache) - started on Plavix. Event monitor as outpt - no AF.    Past Surgical History:  Procedure Laterality Date  . CATARACT EXTRACTION Left 11/09/2017  . LOWER EXTREMITY ANGIOGRAM N/A 05/15/2013   Procedure: LOWER EXTREMITY ANGIOGRAM;  Surgeon: Lorretta Harp, MD;  Location: [facility] CATH LAB;  Service: Cardiovascular;  Laterality: N/A;  . MAZE  ~ 2006  . POPLITEAL ARTERY STENT  05/15/2013    Current Outpatient Medications  Medication Sig Dispense Refill  . ARMOUR THYROID 15 MG tablet TAKE 1 TABLET BY MOUTH EVERY DAY 90 tablet 1  . chlorthalidone (HYGROTON) 25 MG tablet TAKE 1 TABLET BY MOUTH ONCE DAILY 30 tablet 2  . clopidogrel (PLAVIX) 75 MG tablet TAKE 1 TABLET BY MOUTH DAILY AT 12:00 NOON 90 tablet 1  . COSOPT 22.3-6.8 MG/ML ophthalmic solution Place 1 drop into both eyes 2 (two) times daily. Reported on 06/21/2015  0  . famotidine (PEPCID) 20 MG tablet Take 1  tablet (20 mg total) by mouth 2 (two) times daily. 60 tablet 2  . hydrALAZINE (APRESOLINE) 10 MG tablet TAKE 1 TABLET BY MOUTH THREE TIMES DAILY 90 tablet 1  . irbesartan (AVAPRO) 150 MG tablet TAKE 1 TABLET BY MOUTH TWICE DAILY 180 tablet 1  . LUMIGAN 0.01 % SOLN Place 1 drop into both eyes at bedtime.   0  . Omega-3 Fatty Acids (OMEGA 3 PO) Take 5 mLs by mouth daily at 3 pm. One teaspoon daily     No current facility-administered medications for this visit.     Allergies  Allergen Reactions  . Aceon [Perindopril Erbumine]     cough  . Amlodipine     Lower extremity discomfort  . Aspirin Cough  . Azor [Amlodipine-Olmesartan] Cough  .  Beta Adrenergic Blockers     Bradycardia  . Hydralazine Nausea And Vomiting and Other (See Comments)    Weakness   . Sulfamethoxazole-Trimethoprim Other (See Comments)    Unknown reaction  . Vitamin D Analogs Cough    Social History   Socioeconomic History  . Marital status: Married    Spouse name: Not on file  . Number of children: Not on file  . Years of education: Not on file  . Highest education level: Not on file  Occupational History  . Not on file  Social Needs  . Financial resource strain: Not on file  . Food insecurity:    Worry: Not on file    Inability: Not on file  . Transportation needs:    Medical: Not on file    Non-medical: Not on file  Tobacco Use  . Smoking status: Never Smoker  . Smokeless tobacco: Never Used  Substance and Sexual Activity  . Alcohol use: No    Alcohol/week: 0.0 standard drinks  . Drug use: No  . Sexual activity: Never  Lifestyle  . Physical activity:    Days per week: Not on file    Minutes per session: Not on file  . Stress: Not on file  Relationships  . Social connections:    Talks on phone: Not on file    Gets together: Not on file    Attends religious service: Not on file    Active member of club or organization: Not on file    Attends meetings of clubs or organizations: Not on file    Relationship status: Not on file  . Intimate partner violence:    Fear of current or ex partner: Not on file    Emotionally abused: Not on file    Physically abused: Not on file    Forced sexual activity: Not on file  Other Topics Concern  . Not on file  Social History Narrative   Married. Lives with husband. Married 65 years in 2016. [place] 22 years in 2016. 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 6 greatgrandkids.    Finished 10th grade.       Retired from working in [employer] for [employer]          Family History  Problem Relation Age of Onset  . Hypertension Mother   . Ulcers Father   .  Cervical cancer Sister   . Throat cancer Brother   . Lung cancer Brother   . Cancer Brother   . Allergic rhinitis Neg Hx   . Angioedema Neg Hx   . Asthma Neg Hx   . Atopy Neg Hx   . Eczema Neg Hx   .  Immunodeficiency Neg Hx   . Urticaria Neg Hx     Review of Systems:  As stated in the HPI and otherwise negative.   BP (!) 178/60   Pulse (!) 48   Ht '5\' 3"'  (1.6 m)   Wt 134 lb 12.8 oz (61.1 kg)   SpO2 99%   BMI 23.88 kg/m   Physical Examination:  General: Well developed, well nourished, NAD  HEENT: OP clear, mucus membranes moist  SKIN: warm, dry. No rashes. Neuro: No focal deficits  Musculoskeletal: Muscle strength 5/5 all ext  Psychiatric: Mood and affect normal  Neck: No JVD, no carotid bruits, no thyromegaly, no lymphadenopathy.  Lungs:Clear bilaterally, no wheezes, rhonci, crackles Cardiovascular: Regular rate and rhythm. No murmurs, gallops or rubs. Abdomen:Soft. Bowel sounds present. Non-tender.  Extremities: No lower extremity edema. Pulses are 2 + in the bilateral DP/PT.  Echo 08/02/13: Normal LV function, LVEF=55-60%. Trace TR  EKG:  EKG is  not ordered today. The ekg ordered today demonstrates   Recent Labs: 12/09/2017: ALT 10; BUN 36; Creatinine, Ser 1.35; Hemoglobin 11.6; Platelets 193.0; Potassium 4.2; Sodium 138; TSH 3.17   Lipid Panel    Component Value Date/Time   CHOL 224 (H) 11/12/2016 0941   TRIG 71.0 11/12/2016 0941   HDL 70.00 11/12/2016 0941   CHOLHDL 3 11/12/2016 0941   VLDL 14.2 11/12/2016 0941   LDLCALC 140 (H) 11/12/2016 0941     Wt Readings from Last 3 Encounters:  01/13/18 134 lb 12.8 oz (61.1 kg)  12/09/17 135 lb (61.2 kg)  11/18/17 135 lb 6 oz (61.4 kg)     Other studies Reviewed: Additional studies/ records that were reviewed today include: . Review of the above records demonstrates:    Assessment and Plan:   1. PAF/PACs/PVCs: No atrial fib noted on monitor in October 2017.  She has had prior MAZE for PAF. Rare  palpitations. I have told her to call if she feels her heart racing for more than a few minutes and we could repeat a cardiac monitor.   2. HTN: BP is well controlled at home. Her BP is elevated today but she just took her medications 30 minutes prior to her visit today. Will continue chlorthalidone, hydralazine and avapro.     3. PAD: Followed in [place] clinic by Dr. Gwenlyn Found.   Current medicines are reviewed at length with the patient today.  The patient does not have concerns regarding medicines.  The following changes have been made:  no change  Labs/ tests ordered today include:   No orders of the defined types were placed in this encounter.   Disposition:   FU with me in 12  months  Signed, Lauree Chandler, MD 01/13/2018 3:31 PM    [facility] [place], [place], [place]  [address] Phone: [phone]; Fax: [phone]

## 2018-01-13 NOTE — Patient Instructions (Signed)

## 2018-02-07 ENCOUNTER — Ambulatory Visit: Payer: Medicare Other | Admitting: Family Medicine

## 2018-02-07 DIAGNOSIS — H20012 Primary iridocyclitis, left eye: Secondary | ICD-10-CM | POA: Diagnosis not present

## 2018-02-09 ENCOUNTER — Other Ambulatory Visit: Payer: Self-pay | Admitting: Family Medicine

## 2018-02-09 DIAGNOSIS — I739 Peripheral vascular disease, unspecified: Secondary | ICD-10-CM

## 2018-02-10 ENCOUNTER — Other Ambulatory Visit: Payer: Self-pay | Admitting: Family Medicine

## 2018-02-18 ENCOUNTER — Other Ambulatory Visit: Payer: Self-pay

## 2018-02-21 DIAGNOSIS — H20012 Primary iridocyclitis, left eye: Secondary | ICD-10-CM | POA: Diagnosis not present

## 2018-02-26 ENCOUNTER — Other Ambulatory Visit: Payer: Self-pay | Admitting: Family Medicine

## 2018-03-01 ENCOUNTER — Other Ambulatory Visit: Payer: Self-pay | Admitting: Family Medicine

## 2018-03-07 DIAGNOSIS — H20012 Primary iridocyclitis, left eye: Secondary | ICD-10-CM | POA: Diagnosis not present

## 2018-03-22 DIAGNOSIS — Z961 Presence of intraocular lens: Secondary | ICD-10-CM | POA: Diagnosis not present

## 2018-03-22 DIAGNOSIS — H20012 Primary iridocyclitis, left eye: Secondary | ICD-10-CM | POA: Diagnosis not present

## 2018-04-28 DIAGNOSIS — N950 Postmenopausal bleeding: Secondary | ICD-10-CM | POA: Diagnosis not present

## 2018-04-28 DIAGNOSIS — N8189 Other female genital prolapse: Secondary | ICD-10-CM | POA: Diagnosis not present

## 2018-05-04 ENCOUNTER — Other Ambulatory Visit: Payer: Self-pay | Admitting: Family Medicine

## 2018-06-15 ENCOUNTER — Encounter: Payer: Self-pay | Admitting: Family Medicine

## 2018-06-15 ENCOUNTER — Ambulatory Visit (INDEPENDENT_AMBULATORY_CARE_PROVIDER_SITE_OTHER): Payer: Medicare Other | Admitting: Family Medicine

## 2018-06-15 VITALS — BP 131/50 | HR 50 | Wt 118.0 lb

## 2018-06-15 DIAGNOSIS — N183 Chronic kidney disease, stage 3 unspecified: Secondary | ICD-10-CM

## 2018-06-15 DIAGNOSIS — I10 Essential (primary) hypertension: Secondary | ICD-10-CM

## 2018-06-15 DIAGNOSIS — I480 Paroxysmal atrial fibrillation: Secondary | ICD-10-CM

## 2018-06-15 DIAGNOSIS — K219 Gastro-esophageal reflux disease without esophagitis: Secondary | ICD-10-CM

## 2018-06-15 DIAGNOSIS — R05 Cough: Secondary | ICD-10-CM

## 2018-06-15 DIAGNOSIS — I739 Peripheral vascular disease, unspecified: Secondary | ICD-10-CM

## 2018-06-15 DIAGNOSIS — R059 Cough, unspecified: Secondary | ICD-10-CM

## 2018-06-15 DIAGNOSIS — N184 Chronic kidney disease, stage 4 (severe): Secondary | ICD-10-CM | POA: Insufficient documentation

## 2018-06-15 NOTE — Assessment & Plan Note (Signed)
Patient compliant with Plavix.  Denies claudication.  Doing well-continue current medications-need to continue excellent blood pressure control

## 2018-06-15 NOTE — Assessment & Plan Note (Signed)
Patient has come off Pepcid but is noted some increased gas and feeling hoarse after talking for a while-she may need to retrial the Pepcid

## 2018-06-15 NOTE — Progress Notes (Signed)
Phone [phone]   Subjective:  Virtual visit via phonenote This visit type was conducted due to national recommendations for restrictions regarding the COVID-19 Pandemic (e.g. social distancing).  This format is felt to be most appropriate for this patient at this time balancing risks to patient and risks to population by having him in for in person visit.  All issues noted in this document were discussed and addressed.  No physical exam was performed (except for noted visual exam or audio findings with Telehealth visits).  The patient has consented to conduct a Telehealth visit and understands insurance will be billed.   Our team/I connected with [name] on 06/15/18 at 11:20 AM EDT by phone (patient did not have equipment for webex or [email or web address]) and verified that I am speaking with the correct person using two identifiers.  Location patient: [facility] Location provider: [facility], office Persons participating in the virtual visit:  patient  Time on phone: 11 minutes Counseling provided about blood pressure goals, covid 19  Our team/I discussed the limitations of evaluation and management by telemedicine and the availability of in person appointments. In light of current covid-19 pandemic, patient also understands that we are trying to protect them by minimizing in office contact if at all possible.  The patient expressed consent for telemedicine visit and agreed to proceed. Patient understands insurance will be billed.   ROS- no claudication. Some constipation. No chest pain or shortness of rbeath.    Past Medical History-  Patient Active Problem List   Diagnosis Date Noted  . PAD (peripheral artery disease) ([place]) 05/10/2013    Priority: High  . PAF (paroxysmal atrial fibrillation) ([facility])     Priority: High  . History of stroke     Priority: High  . Coughing 08/12/2015    Priority: Medium  . Hypothyroidism 06/07/2014    Priority: Medium  . Hyperlipidemia 06/07/2014   Priority: Medium  . Hypertension     Priority: Medium  . GERD (gastroesophageal reflux disease) 06/25/2014    Priority: Low  . Hyperglycemia 06/25/2014    Priority: Low  . Vitamin D deficiency 06/25/2014    Priority: Low  . Intracervical pessary 06/07/2014    Priority: Low  . Sinus bradycardia 05/19/2013    Priority: Low  . PAC (premature atrial contraction) 05/20/2011    Priority: Low  . CKD (chronic kidney disease), stage III ([place]) 06/15/2018  . Low vitamin B12 level 12/09/2017  . Anemia 08/16/2017  . Perennial allergic rhinitis 08/12/2015    Medications- reviewed and updated Current Outpatient Medications  Medication Sig Dispense Refill  . ARMOUR THYROID 15 MG tablet TAKE 1 TABLET BY MOUTH EVERY DAY 90 tablet 1  . AVAPRO 150 MG tablet TAKE 1 TABLET BY MOUTH TWICE DAILY 180 tablet 1  . clopidogrel (PLAVIX) 75 MG tablet TAKE 1 TABLET BY MOUTH DAILY AT 12:00 NOON (Patient not taking: Reported on 06/15/2018) 90 tablet 1  . COSOPT 22.3-6.8 MG/ML ophthalmic solution Place 1 drop into both eyes 2 (two) times daily. Reported on 06/21/2015  0  . famotidine (PEPCID) 20 MG tablet Take 1 tablet (20 mg total) by mouth 2 (two) times daily. (Patient not taking: Reported on 06/15/2018) 60 tablet 2  . LUMIGAN 0.01 % SOLN Place 1 drop into both eyes at bedtime.   0   No current facility-administered medications for this visit.      Objective:  BP (!) 131/50 Comment: self reported  Pulse (!) 50   Wt 118 lb (53.5  kg)   BMI 20.90 kg/m  Gen: NAD, resting comfortably Lungs: nonlabored, normal respiratory rate  Skin: warm, dry, no obvious rash     Assessment and Plan   #hypertension S: controlled on avapro 150mg  alone. She states she is off the chlorthalidone and hydralazine. BP got down to 95   Patient states she has improved her diet in consultation with nutritionist- she apparently lost some weight (no carbs, dairy, butter). She has lost about 17 lbs- I was clear with her that did not  want her losing any more weight. She states there chronic cough is better. Does seem to be gassier with new diet and gets hoarse if talks for long period.  BP Readings from Last 3 Encounters:  06/15/18 (!) 131/50  01/13/18 (!) 178/60-does have whitecoat hypertension  12/09/17 (!) 148/70  A/P: Improved control with changes in her diet.  Now controlled on Avapro 150 mg alone - Hopeful can we can get her in for an office check within 6 months-continue off chlorthalidone and hydralazine for now  #hypothyroidism S: On thyroid medication-Armour Thyroid 15 mg Lab Results  Component Value Date   TSH 3.17 12/09/2017   A/P: Stable. Continue current medications.    Other notes: 1.Trying to stay home as much as possible. Son that helps her has a fever so now he is not able to help- is going to try to get someone else to help.    Coughing Patient has had improvement in chronic cough after working with nutritionist and making changes in her diet.  Has had long-term chronic cough issues and we have tried multiple changes in medication in the past and she has seen an allergist as well.  Have tried to treat his reflux in the past-still think reflux may contribute.  PAF (paroxysmal atrial fibrillation) No recurrence of atrial fibrillation since maze procedure in 2006.  She is not on an anti-coagulant-only on an antiplatelet for PAD  PAD (peripheral artery disease) ([place]) Patient compliant with Plavix.  Denies claudication.  Doing well-continue current medications-need to continue excellent blood pressure control  GERD (gastroesophageal reflux disease) Patient has come off Pepcid but is noted some increased gas and feeling hoarse after talking for a while-she may need to retrial the Pepcid  CKD (chronic kidney disease), stage III ([facility]) GFR has been in the 40s on prior checks.  She knows to avoid NSAIDs.  Stable likely-helpful to update labs at next visit  3-5 month follow up in person hopefully   Lab/Order associations: Essential hypertension  Coughing  PAF (paroxysmal atrial fibrillation) ([facility])  PAD (peripheral artery disease) ([facility])  Gastroesophageal reflux disease, esophagitis presence not specified  CKD (chronic kidney disease), stage III ([place])  Return precautions advised.  Garret Reddish, MD

## 2018-06-15 NOTE — Assessment & Plan Note (Signed)
No recurrence of atrial fibrillation since maze procedure in 2006.  She is not on an anti-coagulant-only on an antiplatelet for PAD

## 2018-06-15 NOTE — Assessment & Plan Note (Signed)
GFR has been in the 40s on prior checks.  She knows to avoid NSAIDs.  Stable likely-helpful to update labs at next visit

## 2018-06-15 NOTE — Assessment & Plan Note (Signed)
#  hypertension S: controlled on avapro 150mg  alone. She states she is off the chlorthalidone and hydralazine. BP got down to 95   Patient states she has improved her diet in consultation with nutritionist- she apparently lost some weight (no carbs, dairy, butter). She has lost about 17 lbs- I was clear with her that did not want her losing any more weight. She states there chronic cough is better. Does seem to be gassier with new diet and gets hoarse if talks for long period.  BP Readings from Last 3 Encounters:  06/15/18 (!) 131/50  01/13/18 (!) 178/60-does have whitecoat hypertension  12/09/17 (!) 148/70  A/P: Improved control with changes in her diet.  Now controlled on Avapro 150 mg alone - Hopeful can we can get her in for an office check within 6 months-continue off chlorthalidone and hydralazine for now

## 2018-06-15 NOTE — Patient Instructions (Addendum)
Health Maintenance Due  Topic Date Due  . TETANUS/TDAP Pt stated that this was done but cannot remember where this was done  01/02/1945  . PNA vac Low Risk Adult (1 of 2 - PCV13) Pt stated this was postponed due to appt being rescheduled due to Covid-19 01/01/1993    Depression screen [facility] 2/9 11/18/2017 07/16/2017 06/25/2016  Decreased Interest 0 1 0  Down, Depressed, Hopeless 0 0 0  PHQ - 2 Score 0 1 0   Phone visit

## 2018-06-15 NOTE — Assessment & Plan Note (Addendum)
Patient has had improvement in chronic cough after working with nutritionist and making changes in her diet.  Has had long-term chronic cough issues and we have tried multiple changes in medication in the past and she has seen an allergist as well.  Have tried to treat his reflux in the past-still think reflux may contribute.

## 2018-06-24 DIAGNOSIS — H59032 Cystoid macular edema following cataract surgery, left eye: Secondary | ICD-10-CM | POA: Diagnosis not present

## 2018-06-30 ENCOUNTER — Other Ambulatory Visit (HOSPITAL_COMMUNITY): Payer: Self-pay | Admitting: Cardiovascular Disease

## 2018-06-30 DIAGNOSIS — I739 Peripheral vascular disease, unspecified: Secondary | ICD-10-CM

## 2018-07-27 DIAGNOSIS — H31092 Other chorioretinal scars, left eye: Secondary | ICD-10-CM | POA: Diagnosis not present

## 2018-07-27 DIAGNOSIS — H43822 Vitreomacular adhesion, left eye: Secondary | ICD-10-CM | POA: Diagnosis not present

## 2018-07-27 DIAGNOSIS — H35352 Cystoid macular degeneration, left eye: Secondary | ICD-10-CM | POA: Diagnosis not present

## 2018-07-27 DIAGNOSIS — H31012 Macula scars of posterior pole (postinflammatory) (post-traumatic), left eye: Secondary | ICD-10-CM | POA: Diagnosis not present

## 2018-07-28 ENCOUNTER — Other Ambulatory Visit: Payer: Self-pay

## 2018-07-28 ENCOUNTER — Ambulatory Visit (INDEPENDENT_AMBULATORY_CARE_PROVIDER_SITE_OTHER): Payer: Medicare Other | Admitting: Family Medicine

## 2018-07-28 ENCOUNTER — Encounter: Payer: Self-pay | Admitting: Family Medicine

## 2018-07-28 VITALS — BP 135/51 | HR 56 | Temp 98.5°F | Ht 63.0 in | Wt 119.0 lb

## 2018-07-28 DIAGNOSIS — I10 Essential (primary) hypertension: Secondary | ICD-10-CM | POA: Diagnosis not present

## 2018-07-28 DIAGNOSIS — E785 Hyperlipidemia, unspecified: Secondary | ICD-10-CM | POA: Diagnosis not present

## 2018-07-28 DIAGNOSIS — R05 Cough: Secondary | ICD-10-CM | POA: Diagnosis not present

## 2018-07-28 DIAGNOSIS — E559 Vitamin D deficiency, unspecified: Secondary | ICD-10-CM

## 2018-07-28 DIAGNOSIS — E039 Hypothyroidism, unspecified: Secondary | ICD-10-CM

## 2018-07-28 DIAGNOSIS — I739 Peripheral vascular disease, unspecified: Secondary | ICD-10-CM | POA: Diagnosis not present

## 2018-07-28 DIAGNOSIS — R059 Cough, unspecified: Secondary | ICD-10-CM

## 2018-07-28 DIAGNOSIS — I480 Paroxysmal atrial fibrillation: Secondary | ICD-10-CM

## 2018-07-28 LAB — CBC WITH DIFFERENTIAL/PLATELET
Basophils Absolute: 0 10*3/uL (ref 0.0–0.1)
Basophils Relative: 0.4 % (ref 0.0–3.0)
Eosinophils Absolute: 0.1 10*3/uL (ref 0.0–0.7)
Eosinophils Relative: 1.6 % (ref 0.0–5.0)
HCT: 31.9 % — ABNORMAL LOW (ref 36.0–46.0)
Hemoglobin: 10.6 g/dL — ABNORMAL LOW (ref 12.0–15.0)
Lymphocytes Relative: 18.8 % (ref 12.0–46.0)
Lymphs Abs: 0.7 10*3/uL (ref 0.7–4.0)
MCHC: 33.3 g/dL (ref 30.0–36.0)
MCV: 83 fl (ref 78.0–100.0)
Monocytes Absolute: 0.4 10*3/uL (ref 0.1–1.0)
Monocytes Relative: 11.4 % (ref 3.0–12.0)
Neutro Abs: 2.5 10*3/uL (ref 1.4–7.7)
Neutrophils Relative %: 67.8 % (ref 43.0–77.0)
Platelets: 176 10*3/uL (ref 150.0–400.0)
RBC: 3.84 Mil/uL — ABNORMAL LOW (ref 3.87–5.11)
RDW: 15.2 % (ref 11.5–15.5)
WBC: 3.7 10*3/uL — ABNORMAL LOW (ref 4.0–10.5)

## 2018-07-28 LAB — COMPREHENSIVE METABOLIC PANEL
ALT: 8 U/L (ref 0–35)
AST: 13 U/L (ref 0–37)
Albumin: 4 g/dL (ref 3.5–5.2)
Alkaline Phosphatase: 50 U/L (ref 39–117)
BUN: 45 mg/dL — ABNORMAL HIGH (ref 6–23)
CO2: 27 mEq/L (ref 19–32)
Calcium: 9.3 mg/dL (ref 8.4–10.5)
Chloride: 105 mEq/L (ref 96–112)
Creatinine, Ser: 1.29 mg/dL — ABNORMAL HIGH (ref 0.40–1.20)
GFR: 46.92 mL/min — ABNORMAL LOW (ref >60.00)
Glucose, Bld: 80 mg/dL (ref 70–99)
Potassium: 4.2 mEq/L (ref 3.5–5.1)
Sodium: 139 mEq/L (ref 135–145)
Total Bilirubin: 0.5 mg/dL (ref 0.2–1.2)
Total Protein: 6.6 g/dL (ref 6.0–8.3)

## 2018-07-28 LAB — LIPID PANEL
Cholesterol: 199 mg/dL (ref 0–200)
HDL: 53.1 mg/dL (ref >39.00)
LDL Cholesterol: 124 mg/dL — ABNORMAL HIGH (ref 0–99)
NonHDL: 145.86
Total CHOL/HDL Ratio: 4
Triglycerides: 110 mg/dL (ref 0.0–149.0)
VLDL: 22 mg/dL (ref 0.0–40.0)

## 2018-07-28 LAB — VITAMIN D 25 HYDROXY (VIT D DEFICIENCY, FRACTURES): VITD: 33.34 ng/mL (ref 30.00–100.00)

## 2018-07-28 LAB — TSH: TSH: 3.69 u[IU]/mL (ref 0.35–4.50)

## 2018-07-28 NOTE — Assessment & Plan Note (Addendum)
#   chronic cough- changed up diet- cut out dairy and sweets and weight went down to 110 and daughter ordered her a protein drink and now weight is starting to come back. Patient was getting tired of the diet though thankful cough is gone- starting to introduce some items back in. Now up to 119 today.  - considering prior report of cough with vitamin D and statins-I wonder if we could retrial fi needed

## 2018-07-28 NOTE — Assessment & Plan Note (Signed)
S: controlled on  avapro 150mg  with dietary changes (that she did primarily to reduce coughing) BP Readings from Last 3 Encounters:  07/28/18 (!) 135/51  06/15/18 (!) 131/50  01/13/18 (!) 178/60  A/P: Stable. Continue current medications.

## 2018-07-28 NOTE — Patient Instructions (Addendum)
Health Maintenance Due  Topic Date Due  . TETANUS/TDAP Please stop by your local pharmacy - hold off for now due to covid 19. We can get tetanus shot here if you get a cut/scrape.   01/02/1945  . PNA vac Low Risk Adult (1 of 2 - PCV13) Postponed for future in office visit when Covid-19 calms 01/01/1993   Glad you have been able to stabilize and now increase your weight. Lets make sure labs are ok today.   Please stop by lab before you go If you do not have mychart- we will call you about results within 5 business days of [place] receiving them.  If you have mychart- we will send your results within 3 business days of [place] receiving them.  If abnormal or we want to clarify a result, we will call or mychart you to make sure you receive the message.  If you have questions or concerns or don't hear within 5-7 days, please send [place] a message or call us.

## 2018-07-28 NOTE — Assessment & Plan Note (Signed)
S: On thyroid medication- armour thyroid 15 mg (inherited from prior PCP and patient does not want to change) Lab Results  Component Value Date   TSH 3.17 12/09/2017  A/P: Doubt weight loss is thyroid related but will update TSH-in addition weight loss has stabilized with dietary changes

## 2018-07-28 NOTE — Progress Notes (Signed)
Phone [phone]   Subjective:  [name] is a 85 y.o. year old very pleasant female patient who presents for/with See problem oriented charting Chief Complaint  Patient presents with  . Weight Loss   ROS- No fever, chills, cough, shortness of breath, body aches, sore throat, or loss of taste or smell   Past Medical History-  Patient Active Problem List   Diagnosis Date Noted  . Coughing 08/12/2015    Priority: High  . PAD (peripheral artery disease) ([place]) 05/10/2013    Priority: High  . PAF (paroxysmal atrial fibrillation) (HCC)     Priority: High  . History of stroke     Priority: High  . Hypothyroidism 06/07/2014    Priority: Medium  . Hyperlipidemia 06/07/2014    Priority: Medium  . Hypertension     Priority: Medium  . GERD (gastroesophageal reflux disease) 06/25/2014    Priority: Low  . Hyperglycemia 06/25/2014    Priority: Low  . Vitamin D deficiency 06/25/2014    Priority: Low  . Intracervical pessary 06/07/2014    Priority: Low  . Sinus bradycardia 05/19/2013    Priority: Low  . PAC (premature atrial contraction) 05/20/2011    Priority: Low  . CKD (chronic kidney disease), stage III ([place]) 06/15/2018  . Low vitamin B12 level 12/09/2017  . Anemia 08/16/2017  . Perennial allergic rhinitis 08/12/2015    Medications- reviewed and updated Current Outpatient Medications  Medication Sig Dispense Refill  . ARMOUR THYROID 15 MG tablet TAKE 1 TABLET BY MOUTH EVERY DAY 90 tablet 1  . AVAPRO 150 MG tablet TAKE 1 TABLET BY MOUTH TWICE DAILY 180 tablet 1  . clopidogrel (PLAVIX) 75 MG tablet TAKE 1 TABLET BY MOUTH DAILY AT 12:00 NOON 90 tablet 1  . COSOPT 22.3-6.8 MG/ML ophthalmic solution Place 1 drop into both eyes 2 (two) times daily. Reported on 06/21/2015  0  . LUMIGAN 0.01 % SOLN Place 1 drop into both eyes at bedtime.   0  . ketorolac (ACULAR) 0.4 % SOLN     . LOTEMAX SM 0.38 % GEL      No current facility-administered medications for this visit.       Objective:  BP (!) 135/51 (BP Location: Left Arm, Patient Position: Sitting, Cuff Size: Normal)   Pulse (!) 56   Temp 98.5 F (36.9 C) (Oral)   Ht 5\' 3"  (1.6 m)   Wt 119 lb (54 kg)   SpO2 99%   BMI 21.08 kg/m  Gen: NAD, resting comfortably CV: RRR no murmurs rubs or gallops Lungs: CTAB no crackles, wheeze, rhonchi Abdomen: soft/nontender/nondistended Ext: no edema Skin: warm, dry    Assessment and Plan   #Chronic coughing-now resolved changed up diet- cut out dairy and sweets and weight went down to 110 and daughter ordered her a protein drink and now weight is starting to come back. Patient was getting tired of the diet though thankful cough is gone- starting to introduce some items back in. Now up to 119 today.  - considering prior report of cough with vitamin D and statins-I wonder if we could retrial fi needed   #hypertension S: controlled on  avapro 150mg  with dietary changes (that she did primarily to reduce coughing) BP Readings from Last 3 Encounters:  07/28/18 (!) 135/51  06/15/18 (!) 131/50  01/13/18 (!) 178/60  A/P: Stable. Continue current medications.   #hypothyroidism S: On thyroid medication- armour thyroid 15 mg (inherited from prior PCP and patient does not want to  change) Lab Results  Component Value Date   TSH 3.17 12/09/2017  A/P: Doubt weight loss is thyroid related but will update TSH-in addition weight loss has stabilized with dietary changes  # atrial fibrillation S:  No recurrence since maze procedure 2006. No anticoagulant as a result. Had palpitations short term one day after a meal but not clearly a fib recurrence A/P: Stable. Continue without meds.   # PAD/history of stroke/hyperlipidemia-with history of stenting popliteal artery S: remains on plavix. Has not tolerated statin for myalgia or cough.  She reported cough on even once a week statin in the past but that was before dietary change A/P: PAD likely stable-no reported claudication.  We  will continue Plavix.  For hyperlipidemia-update cholesterol today-reconsider trial of statin since cough has resolved-potentially start with just once a week  # vitamin D deficiency S: allergic to vitamin D supplements- she is trying to get outside.  A/P: We will update vitamin D levels-once again may be reasonable to try vitamin D again since prior reported side effect was cough  Coughing # chronic cough- changed up diet- cut out dairy and sweets and weight went down to 110 and daughter ordered her a protein drink and now weight is starting to come back. Patient was getting tired of the diet though thankful cough is gone- starting to introduce some items back in. Now up to 119 today.  - considering prior report of cough with vitamin D and statins-I wonder if we could retrial fi needed   Hypertension S: controlled on  avapro 150mg  with dietary changes (that she did primarily to reduce coughing) BP Readings from Last 3 Encounters:  07/28/18 (!) 135/51  06/15/18 (!) 131/50  01/13/18 (!) 178/60  A/P: Stable. Continue current medications.   Hypothyroidism S: On thyroid medication- armour thyroid 15 mg (inherited from prior PCP and patient does not want to change) Lab Results  Component Value Date   TSH 3.17 12/09/2017  A/P: Doubt weight loss is thyroid related but will update TSH-in addition weight loss has stabilized with dietary changes   We did not schedule follow-up in 6 months would be reasonable Lab/Order associations: Hypothyroidism, unspecified type - Plan: TSH, TSH  Vitamin D deficiency - Plan: VITAMIN D 25 Hydroxy (Vit-D Deficiency, Fractures), VITAMIN D 25 Hydroxy (Vit-D Deficiency, Fractures)  Hyperlipidemia, unspecified hyperlipidemia type - Plan: CBC with Differential/Platelet, Comprehensive metabolic panel, Lipid panel, Lipid panel, Comprehensive metabolic panel, CBC with Differential/Platelet  Essential hypertension - Plan: CBC with Differential/Platelet, Comprehensive  metabolic panel, Lipid panel, Lipid panel, Comprehensive metabolic panel, CBC with Differential/Platelet  PAF (paroxysmal atrial fibrillation) (HCC)  PAD (peripheral artery disease) (HCC)  Coughing  Return precautions advised.  Garret Reddish, MD

## 2018-08-01 MED ORDER — ROSUVASTATIN CALCIUM 5 MG PO TABS: 5.0000 mg | ORAL_TABLET | ORAL | 3 refills | Status: DC

## 2018-08-01 NOTE — Addendum Note (Signed)
Addended by: Gwenyth Ober R on: 08/01/2018 04:41 PM   Modules accepted: Orders

## 2018-08-07 ENCOUNTER — Other Ambulatory Visit: Payer: Self-pay | Admitting: Family Medicine

## 2018-08-07 DIAGNOSIS — I739 Peripheral vascular disease, unspecified: Secondary | ICD-10-CM

## 2018-08-30 DIAGNOSIS — H35352 Cystoid macular degeneration, left eye: Secondary | ICD-10-CM | POA: Diagnosis not present

## 2018-08-30 DIAGNOSIS — H31012 Macula scars of posterior pole (postinflammatory) (post-traumatic), left eye: Secondary | ICD-10-CM | POA: Diagnosis not present

## 2018-08-30 DIAGNOSIS — H31092 Other chorioretinal scars, left eye: Secondary | ICD-10-CM | POA: Diagnosis not present

## 2018-08-30 DIAGNOSIS — H43822 Vitreomacular adhesion, left eye: Secondary | ICD-10-CM | POA: Diagnosis not present

## 2018-09-06 DIAGNOSIS — N952 Postmenopausal atrophic vaginitis: Secondary | ICD-10-CM | POA: Diagnosis not present

## 2018-09-06 DIAGNOSIS — N8189 Other female genital prolapse: Secondary | ICD-10-CM | POA: Diagnosis not present

## 2018-09-12 ENCOUNTER — Other Ambulatory Visit: Payer: Self-pay | Admitting: Family Medicine

## 2018-09-16 ENCOUNTER — Other Ambulatory Visit: Payer: Self-pay | Admitting: Family Medicine

## 2018-09-20 ENCOUNTER — Telehealth: Payer: Self-pay | Admitting: Family Medicine

## 2018-09-20 NOTE — Telephone Encounter (Signed)
Pt called and wants to get chlorthalidone (HYGROTON) 25 MG tablet refilled, she said she started taking again because it is working better for her, I set her up with an appt when Dr Retail banker is available next but she said she cant wait that long.

## 2018-09-20 NOTE — Telephone Encounter (Signed)
Patient stated she restarted taking medication due to BP i being high,informed she should not restart ndingwithout speaking with her pcp,appt schedule voices understanding,no symptoms of chest pain or heart attact.

## 2018-10-10 NOTE — Progress Notes (Signed)
Phone [phone]   Subjective:  [name] is a 85 y.o. year old very pleasant female patient who presents for/with See problem oriented charting Chief Complaint  Patient presents with  . Follow-up    Not fasting today.   . Hypertension  . Hypothyroidism  . Hyperlipidemia  . Hyperglycemia  . Atrial Fibrillation   ROS- Denies HA, dizziness, CP, SOB, visual changes.   Past Medical History-  Patient Active Problem List   Diagnosis Date Noted  . Coughing 08/12/2015    Priority: High  . PAD (peripheral artery disease) ([place]) 05/10/2013    Priority: High  . PAF (paroxysmal atrial fibrillation) (HCC)     Priority: High  . History of stroke     Priority: High  . Hypothyroidism 06/07/2014    Priority: Medium  . Hyperlipidemia 06/07/2014    Priority: Medium  . Hypertension     Priority: Medium  . GERD (gastroesophageal reflux disease) 06/25/2014    Priority: Low  . Hyperglycemia 06/25/2014    Priority: Low  . Vitamin D deficiency 06/25/2014    Priority: Low  . Intracervical pessary 06/07/2014    Priority: Low  . Sinus bradycardia 05/19/2013    Priority: Low  . PAC (premature atrial contraction) 05/20/2011    Priority: Low  . Senile purpura ([place]) 10/11/2018  . CKD (chronic kidney disease), stage III ([place]) 06/15/2018  . Low vitamin B12 level 12/09/2017  . Anemia 08/16/2017  . Perennial allergic rhinitis 08/12/2015    Medications- reviewed and updated Current Outpatient Medications  Medication Sig Dispense Refill  . ARMOUR THYROID 15 MG tablet TAKE 1 TABLET BY MOUTH EVERY DAY 90 tablet 1  . clopidogrel (PLAVIX) 75 MG tablet TAKE 1 TABLET BY MOUTH EVERY DAY AT NOON 90 tablet 1  . COSOPT 22.3-6.8 MG/ML ophthalmic solution Place 1 drop into both eyes 2 (two) times daily. Reported on 06/21/2015  0  . irbesartan (AVAPRO) 150 MG tablet Take 1 tablet (150 mg total) by mouth 2 (two) times daily. 180 tablet 2  . rosuvastatin (CRESTOR) 5 MG tablet Take 1 tablet (5 mg total)  by mouth once a week. 13 tablet 3  . chlorthalidone (HYGROTON) 25 MG tablet Take 1 tablet (25 mg total) by mouth daily as needed (if blood pressure >140/90 despite avapro/irbesartan). 90 tablet 2   No current facility-administered medications for this visit.      Objective:  BP (!) 152/64 (BP Location: Left Arm, Patient Position: Sitting, Cuff Size: Normal)   Pulse (!) 55   Temp 97.9 F (36.6 C) (Oral)   Ht 5\' 3"  (1.6 m)   Wt 121 lb 6.4 oz (55.1 kg)   SpO2 98%   BMI 21.51 kg/m  Gen: NAD, resting comfortably CV: RRR no murmurs rubs or gallops.  Mildly bradycardic on my exam Lungs: CTAB no crackles, wheeze, rhonchi Abdomen: soft/nontender/nondistended/normal bowel sounds.  Ext: no edema Skin: warm, dry    Assessment and Plan   #hypertension/CKD S: controlled on Avapro 150 mg BID. Checking BP at home, has been elevated since starting abx for tooth extraction. Systolic has been ranging from 629-528, diastolic in the 41'L.  Not doing as well with salt intake. Has noticed some swelling in ankles. Not exercising regularly.  BP Readings from Last 3 Encounters:  10/11/18 (!) 152/64  07/28/18 (!) 135/51  06/15/18 (!) 131/50  A/P: mild poor control today- she is going to monitor after she finishes keflex for dental extraction- and let us know if blood pressure going  back into normal. Diet not as strict now- encouraged her to be particularly careful with salt intake.  -She did not want to add a regular medication but did agree to take chlorthalidone if blood pressure greater than 140/90 two hours after she takes her Avapro  #hypothyroidism S: On thyroid medication-Armour Thyroid 15 mg daily.  Reports cold intolerance. Has noticed increased/easy bruising.  Lab Results  Component Value Date   TSH 3.69 07/28/2018  A/P: stable per TSH- continue current medicine (does not want to change to synthetic)  #hyperlipidemia/PAD- history popliteal stent/Hx of Stroke S: Has not started on  Rosuvastatin yet.  History of PAD requiring popliteal stent Compliant with plavix-for PAD as well as history of stroke Lab Results  Component Value Date   CHOL 199 07/28/2018   HDL 53.10 07/28/2018   LDLCALC 124 (H) 07/28/2018   TRIG 110.0 07/28/2018   CHOLHDL 4 07/28/2018   A/P: strongly encouraged her to start rosuvastatin until after she finishes the keflex and Bp comes back down (ohpefully)  Continue plavix- she as told its ok to continue this even through dental extraction fortunately.   # Hyperglycemia  S:  controlled with diet. Not checking BG at home. Doesn't eat many sweets, carbs in moderation.  Lab Results  Component Value Date   HGBA1C 6.2 11/12/2016   HGBA1C 6.1 (A) 03/09/2013   HGBA1C 6.1 (H) 04/03/2011   A/P: Prior elevated A1c-over a year since check-update A1c today to make sure it has not progressed to diabetes- would want confirmatory test before diagnosing  # Atrial Fibrillation  S: No recurrence since Maze procedure 2006- not on anticoagulation as a result A/P: No obvious recurrence-continue to monitor  #senile purpura-patient reports easy bruising and bleeding on Plavix-we discussed diagnosis of senile purpura.  Stable-continue to monitor  #declines all immunizations.   #Slight vaginal bleeding- salve from gyn as reported related to external irritation per GYN-recommended patient follow-up if fails to improve  Recommended follow up: 4-6 weeks or sooner if needed-continue to monitor blood pressure at home Future Appointments  Date Time Provider [place]  11/15/2018  4:20 PM [name], MD [facility]   Lab/Order associations:   ICD-10-CM   1. Essential hypertension  J85 Basic metabolic panel  2. Hyperlipidemia, unspecified hyperlipidemia type  E78.5 CBC  3. Hyperglycemia  R73.9 Hemoglobin A1c  4. Hypothyroidism, unspecified type  E03.9   5. PAD (peripheral artery disease) (HCC)  I73.9   6. PAF (paroxysmal atrial fibrillation) (HCC)   I48.0   7. Senile purpura (HCC)  D69.2     Meds ordered this encounter  Medications  . irbesartan (AVAPRO) 150 MG tablet    Sig: Take 1 tablet (150 mg total) by mouth 2 (two) times daily.    Dispense:  180 tablet    Refill:  2  . chlorthalidone (HYGROTON) 25 MG tablet    Sig: Take 1 tablet (25 mg total) by mouth daily as needed (if blood pressure >140/90 despite avapro/irbesartan).    Dispense:  90 tablet    Refill:  2   Return precautions advised.  Garret Reddish, MD

## 2018-10-10 NOTE — Patient Instructions (Addendum)
Health Maintenance Due  Topic Date Due  . [name] - could consider tetanus shot at your pharmacy 01/02/1945  . PNA vac Low Risk Adult (1 of 2 - PCV13)- declined today 01/01/1993  . INFLUENZA VACCINE - declined 09/24/2018   Sent in chlorthalidone to use if blood pressure >140/90 1-2 hours after taking avapro perhaps  Please stop by lab before you go If you do not have mychart- we will call you about results within 5 business days of [place] receiving them.  If you have mychart- we will send your results within 3 business days of [place] receiving them.  If abnormal or we want to clarify a result, we will call or mychart you to make sure you receive the message.  If you have questions or concerns or don't hear within 5-7 days, please send [place] a message or call us.

## 2018-10-11 ENCOUNTER — Encounter: Payer: Self-pay | Admitting: Family Medicine

## 2018-10-11 ENCOUNTER — Other Ambulatory Visit: Payer: Self-pay

## 2018-10-11 ENCOUNTER — Ambulatory Visit (INDEPENDENT_AMBULATORY_CARE_PROVIDER_SITE_OTHER): Payer: Medicare Other | Admitting: Family Medicine

## 2018-10-11 VITALS — BP 152/64 | HR 55 | Temp 97.9°F | Ht 63.0 in | Wt 121.4 lb

## 2018-10-11 DIAGNOSIS — I480 Paroxysmal atrial fibrillation: Secondary | ICD-10-CM | POA: Diagnosis not present

## 2018-10-11 DIAGNOSIS — I739 Peripheral vascular disease, unspecified: Secondary | ICD-10-CM

## 2018-10-11 DIAGNOSIS — R739 Hyperglycemia, unspecified: Secondary | ICD-10-CM

## 2018-10-11 DIAGNOSIS — I10 Essential (primary) hypertension: Secondary | ICD-10-CM

## 2018-10-11 DIAGNOSIS — E785 Hyperlipidemia, unspecified: Secondary | ICD-10-CM

## 2018-10-11 DIAGNOSIS — D692 Other nonthrombocytopenic purpura: Secondary | ICD-10-CM | POA: Diagnosis not present

## 2018-10-11 DIAGNOSIS — E039 Hypothyroidism, unspecified: Secondary | ICD-10-CM

## 2018-10-11 MED ORDER — CHLORTHALIDONE 25 MG PO TABS: 25.0000 mg | ORAL_TABLET | Freq: Every day | ORAL | 2 refills | Status: DC | PRN

## 2018-10-11 MED ORDER — IRBESARTAN 150 MG PO TABS: 150.0000 mg | ORAL_TABLET | Freq: Two times a day (BID) | ORAL | 2 refills | Status: DC

## 2018-10-12 LAB — BASIC METABOLIC PANEL
BUN: 24 mg/dL — ABNORMAL HIGH (ref 6–23)
CO2: 25 mEq/L (ref 19–32)
Calcium: 9.1 mg/dL (ref 8.4–10.5)
Chloride: 106 mEq/L (ref 96–112)
Creatinine, Ser: 1.13 mg/dL (ref 0.40–1.20)
GFR: 54.64 mL/min — ABNORMAL LOW (ref >60.00)
Glucose, Bld: 97 mg/dL (ref 70–99)
Potassium: 4.3 mEq/L (ref 3.5–5.1)
Sodium: 138 mEq/L (ref 135–145)

## 2018-10-12 LAB — CBC
HCT: 31.2 % — ABNORMAL LOW (ref 36.0–46.0)
Hemoglobin: 10.3 g/dL — ABNORMAL LOW (ref 12.0–15.0)
MCHC: 32.9 g/dL (ref 30.0–36.0)
MCV: 85.1 fl (ref 78.0–100.0)
Platelets: 163 10*3/uL (ref 150.0–400.0)
RBC: 3.66 Mil/uL — ABNORMAL LOW (ref 3.87–5.11)
RDW: 14.4 % (ref 11.5–15.5)
WBC: 3.4 10*3/uL — ABNORMAL LOW (ref 4.0–10.5)

## 2018-10-12 LAB — HEMOGLOBIN A1C: Hgb A1c MFr Bld: 6.2 % (ref 4.6–6.5)

## 2018-10-24 DIAGNOSIS — G44201 Tension-type headache, unspecified, intractable: Secondary | ICD-10-CM | POA: Diagnosis not present

## 2018-10-24 DIAGNOSIS — K219 Gastro-esophageal reflux disease without esophagitis: Secondary | ICD-10-CM | POA: Diagnosis not present

## 2018-11-15 ENCOUNTER — Encounter: Payer: Self-pay | Admitting: Family Medicine

## 2018-11-15 ENCOUNTER — Ambulatory Visit (INDEPENDENT_AMBULATORY_CARE_PROVIDER_SITE_OTHER): Payer: Medicare Other | Admitting: Family Medicine

## 2018-11-15 ENCOUNTER — Other Ambulatory Visit: Payer: Self-pay

## 2018-11-15 VITALS — BP 160/62 | HR 48 | Temp 98.5°F | Ht 63.0 in | Wt 118.4 lb

## 2018-11-15 DIAGNOSIS — R05 Cough: Secondary | ICD-10-CM | POA: Diagnosis not present

## 2018-11-15 DIAGNOSIS — R739 Hyperglycemia, unspecified: Secondary | ICD-10-CM | POA: Diagnosis not present

## 2018-11-15 DIAGNOSIS — I10 Essential (primary) hypertension: Secondary | ICD-10-CM | POA: Diagnosis not present

## 2018-11-15 DIAGNOSIS — E441 Mild protein-calorie malnutrition: Secondary | ICD-10-CM

## 2018-11-15 DIAGNOSIS — R053 Chronic cough: Secondary | ICD-10-CM

## 2018-11-15 NOTE — Assessment & Plan Note (Signed)
S: poorly controlled on Irbesartan 150 mg BID. Added Chlorthalidone 25 mg to take daily prn at last visit- she started with 12.5 mg instead and blood pressures came down to 130s or 140s over 50-60.[name] Pt states her bp is still fluctuating some. Washed her hair this morning and noted BP up-  Pt states this morning her top number this morning was 170 and yesterday the top number was 140- did go ahead and take full chlorthalidone today.   Feels lightheaded/fatigued if BP gets below 115.   In past back to last October had been on hydralazine 10mg  TID as well BP Readings from Last 3 Encounters:  11/15/18 (!) 160/62  10/11/18 (!) 152/64  07/28/18 (!) 135/51  A/P: Poor control today. We opted to target a range of 712-929 systolic range. Continue irbesartan 150mg  BID. She agrees to increase chlorthalidone from 12.5mg  to 25mg  consistently. We discussed can reduce if BP begins to fall below 115 but she will need to update me if she makes this change.

## 2018-11-15 NOTE — Patient Instructions (Addendum)
Health Maintenance Due  Topic Date Due  . TETANUS/TDAP - if you get a cut/scrape we need to udpate your tetanus shot 01/02/1945  . PNA vac Low Risk Adult (1 of 2 - PCV13)-pt would like to hold off 01/01/1993   For blood pressure 115-145 target for top # lets use full chlorthalidone 25 mg as well as avapro 150mg  twice a day. If you note your # getting below 115 please go back to half tablet of chlorthalidone and update me.   1-2 month blood pressure check- please schedule before you go

## 2018-11-15 NOTE — Assessment & Plan Note (Signed)
Protein-calorie malnutrition Hypergylcemia S: Patient's cough resolved with tightened up diet that she discussed with nutrionist. Unfortunately she lost weight on this so she has stopped the tightened up diet- cough has worsened. Since loosening up diet weight has stabilized. She is taking a supplement that sounds like a veggie non dairy (ensure type product)  Has had prediabetes with a1c up to 6.2 and wants to avoid medication- declines metformin A/P: Patient could tighten diet again but really do not want her losing more weight- she is ok with tolerating weight. I did ask her to try to increase to 2 of the higher calorie beverages per day (will need to monitor a1c as well with this increase)- thankful weight has largely stabilized- in 3 months very close in weight at 118 compared to 119 last visit.

## 2018-11-15 NOTE — Progress Notes (Signed)
Phone [phone]   Subjective:  [name] is a 85 y.o. year old very pleasant female patient who presents for/with See problem oriented charting Chief Complaint  Patient presents with  . Follow-up  . Hypertension   ROS- No chest pain or shortness of breath. No headache or blurry vision. Still has chronic cough.    Past Medical History-  Patient Active Problem List   Diagnosis Date Noted  . Chronic cough 08/12/2015    Priority: High  . PAD (peripheral artery disease) ([place]) 05/10/2013    Priority: High  . PAF (paroxysmal atrial fibrillation) (HCC)     Priority: High  . History of stroke     Priority: High  . Hyperglycemia 06/25/2014    Priority: Medium  . Hypothyroidism 06/07/2014    Priority: Medium  . Hyperlipidemia 06/07/2014    Priority: Medium  . Hypertension     Priority: Medium  . GERD (gastroesophageal reflux disease) 06/25/2014    Priority: Low  . Vitamin D deficiency 06/25/2014    Priority: Low  . Intracervical pessary 06/07/2014    Priority: Low  . Sinus bradycardia 05/19/2013    Priority: Low  . PAC (premature atrial contraction) 05/20/2011    Priority: Low  . Senile purpura ([place]) 10/11/2018  . CKD (chronic kidney disease), stage III ([place]) 06/15/2018  . Low vitamin B12 level 12/09/2017  . Anemia 08/16/2017  . Perennial allergic rhinitis 08/12/2015    Medications- reviewed and updated Current Outpatient Medications  Medication Sig Dispense Refill  . ARMOUR THYROID 15 MG tablet TAKE 1 TABLET BY MOUTH EVERY DAY 90 tablet 1  . chlorthalidone (HYGROTON) 25 MG tablet Take 1 tablet (25 mg total) by mouth daily as needed (if blood pressure >140/90 despite avapro/irbesartan). 90 tablet 2  . clopidogrel (PLAVIX) 75 MG tablet TAKE 1 TABLET BY MOUTH EVERY DAY AT NOON 90 tablet 1  . COSOPT 22.3-6.8 MG/ML ophthalmic solution Place 1 drop into both eyes 2 (two) times daily. Reported on 06/21/2015  0  . irbesartan (AVAPRO) 150 MG tablet Take 1 tablet (150 mg  total) by mouth 2 (two) times daily. 180 tablet 2  . rosuvastatin (CRESTOR) 5 MG tablet Take 1 tablet (5 mg total) by mouth once a week. (Patient not taking: Reported on 11/15/2018) 13 tablet 3   No current facility-administered medications for this visit.      Objective:  BP (!) 160/62   Pulse (!) 48   Temp 98.5 F (36.9 C)   Ht 5\' 3"  (1.6 m)   Wt 118 lb 6.4 oz (53.7 kg)   SpO2 99%   BMI 20.97 kg/m  Gen: NAD, resting comfortably CV: RRR no murmurs rubs or gallops Lungs: CTAB no crackles, wheeze, rhonchi Ext: no edema Skin: warm, dry Neuro: normal gait and speech    Assessment and Plan   Hypertension S: poorly controlled on Irbesartan 150 mg BID. Added Chlorthalidone 25 mg to take daily prn at last visit- she started with 12.5 mg instead and blood pressures came down to 130s or 140s over 50-60.[name] Pt states her bp is still fluctuating some. Washed her hair this morning and noted BP up-  Pt states this morning her top number this morning was 170 and yesterday the top number was 140- did go ahead and take full chlorthalidone today.   Feels lightheaded/fatigued if BP gets below 115.   In past back to last October had been on hydralazine 10mg  TID as well BP Readings from Last 3 Encounters:  11/15/18 (!) 160/62  10/11/18 (!) 152/64  07/28/18 (!) 135/51  A/P: Poor control today. We opted to target a range of 242-353 systolic range. Continue irbesartan 150mg  BID. She agrees to increase chlorthalidone from 12.5mg  to 25mg  consistently. We discussed can reduce if BP begins to fall below 115 but she will need to update me if she makes this change.   Chronic cough Protein-calorie malnutrition Hypergylcemia S: Patient's cough resolved with tightened up diet that she discussed with nutrionist. Unfortunately she lost weight on this so she has stopped the tightened up diet- cough has worsened. Since loosening up diet weight has stabilized. She is taking a supplement that sounds like a veggie  non dairy (ensure type product)  Has had prediabetes with a1c up to 6.2 and wants to avoid medication- declines metformin A/P: Patient could tighten diet again but really do not want her losing more weight- she is ok with tolerating weight. I did ask her to try to increase to 2 of the higher calorie beverages per day (will need to monitor a1c as well with this increase)- thankful weight has largely stabilized- in 3 months very close in weight at 118 compared to 119 last visit.   Recommended follow up: 1-2 month BP recheck Future Appointments  Date Time Provider [place]  11/30/2018  1:00 PM [facility]  12/27/2018  3:40 PM Yong Channel, Brayton Mars, MD [facility]   Lab/Order associations:   ICD-10-CM   1. Essential hypertension  I10   2. Chronic cough  R05   3. Mild protein-calorie malnutrition (HCC)  E44.1   4. Hyperglycemia  R73.9    Return precautions advised.  Garret Reddish, MD

## 2018-11-30 ENCOUNTER — Ambulatory Visit (HOSPITAL_COMMUNITY)
Admission: RE | Admit: 2018-11-30 | Discharge: 2018-11-30 | Disposition: A | Discharge status: Home / Self Care | Payer: Medicare Other | Source: Ambulatory Visit | Attending: Cardiology | Admitting: Cardiology

## 2018-11-30 ENCOUNTER — Other Ambulatory Visit (HOSPITAL_COMMUNITY): Payer: Self-pay | Admitting: Cardiovascular Disease

## 2018-11-30 ENCOUNTER — Other Ambulatory Visit: Payer: Self-pay

## 2018-11-30 DIAGNOSIS — I739 Peripheral vascular disease, unspecified: Secondary | ICD-10-CM | POA: Diagnosis not present

## 2018-12-01 ENCOUNTER — Other Ambulatory Visit: Payer: Self-pay | Admitting: *Deleted

## 2018-12-01 ENCOUNTER — Encounter: Payer: Self-pay | Admitting: *Deleted

## 2018-12-01 DIAGNOSIS — I739 Peripheral vascular disease, unspecified: Secondary | ICD-10-CM

## 2018-12-21 DIAGNOSIS — H35371 Puckering of macula, right eye: Secondary | ICD-10-CM | POA: Diagnosis not present

## 2018-12-21 DIAGNOSIS — H43822 Vitreomacular adhesion, left eye: Secondary | ICD-10-CM | POA: Diagnosis not present

## 2018-12-21 DIAGNOSIS — H31012 Macula scars of posterior pole (postinflammatory) (post-traumatic), left eye: Secondary | ICD-10-CM | POA: Diagnosis not present

## 2018-12-21 DIAGNOSIS — H31092 Other chorioretinal scars, left eye: Secondary | ICD-10-CM | POA: Diagnosis not present

## 2018-12-27 ENCOUNTER — Encounter: Payer: Self-pay | Admitting: Family Medicine

## 2018-12-27 ENCOUNTER — Ambulatory Visit (INDEPENDENT_AMBULATORY_CARE_PROVIDER_SITE_OTHER): Payer: Medicare Other | Admitting: Family Medicine

## 2018-12-27 ENCOUNTER — Other Ambulatory Visit: Payer: Self-pay

## 2018-12-27 VITALS — BP 154/62 | HR 52 | Temp 98.2°F | Ht 63.0 in | Wt 119.8 lb

## 2018-12-27 DIAGNOSIS — I10 Essential (primary) hypertension: Secondary | ICD-10-CM | POA: Diagnosis not present

## 2018-12-27 DIAGNOSIS — D649 Anemia, unspecified: Secondary | ICD-10-CM | POA: Diagnosis not present

## 2018-12-27 DIAGNOSIS — D72819 Decreased white blood cell count, unspecified: Secondary | ICD-10-CM

## 2018-12-27 DIAGNOSIS — E785 Hyperlipidemia, unspecified: Secondary | ICD-10-CM | POA: Diagnosis not present

## 2018-12-27 DIAGNOSIS — E039 Hypothyroidism, unspecified: Secondary | ICD-10-CM

## 2018-12-27 NOTE — Assessment & Plan Note (Signed)
S: compliant with hygroton 25 mg- also supposed to be on iavapro 150mg  BID   pt states her bp this mornin was 117 systolic and diastolic was low but she cant remember. She stopped taking the avapro in the morning for a while b/c she noticed her numbers were low running in the 130s. She started back taking it this morning after 2 days of not taking. BP Readings from Last 3 Encounters:  12/27/18 (!) 154/62  11/15/18 (!) 160/62  10/11/18 (!) 152/64  A/P: Patient's blood pressure was well controlled on Avapro 150 mg and chlorthalidone 25 mg-unfortunately she was concerned that blood pressures in the 130s were too low.  When she stopped taking her Avapro in the morning blood pressure increased into the 170s-after discussion today on blood pressure goals she is willing to consistently take chlorthalidone 25 mg and Avapro 150 mg twice a day-since we have already confirmed home pressures are controlled on this regimen we opted for 4 or 64-month follow-up- may need to extend further depending on covid 19 situation

## 2018-12-27 NOTE — Patient Instructions (Addendum)
Health Maintenance Due  Topic Date Due  . TETANUS/TDAP -does not want right now 01/02/1945  . PNA vac Low Risk Adult (1 of 2 - PCV13)-does not want right now 01/01/1993   Make sure to take avapro 150mg  twice a day AND the chlorthalidone 25mg  each morning.   Thrilled you gained 1 lb!   Please call gynecology back as we want to check in on pinkish discharge especially since you are somewhat anemic and could be losing some blood in this discharge  4-5 month follow up or sooner if blood pressure doesn't cooperate- ideally Id like to keep you under 140/90 so I like those 130 readings as long as you dont feel poorly in that range  Happy Early [place]!   Please stop by lab before you go If you do not have mychart- we will call you about results within 5 business days of [place] receiving them.  If you have mychart- we will send your results within 3 business days of [place] receiving them.  If abnormal or we want to clarify a result, we will call or mychart you to make sure you receive the message.  If you have questions or concerns or don't hear within 5-7 days, please send [place] a message or call us.

## 2018-12-27 NOTE — Progress Notes (Signed)
Phone [phone]  Subjective:  In person visit [name] is a 85 y.o. year old very pleasant female patient who presents for/with See problem oriented charting Chief Complaint  Patient presents with  . Follow-up  . Hypertension     ROS- No chest pain or shortness of breath. No headache or blurry vision.  .  Past Medical History-  Patient Active Problem List   Diagnosis Date Noted  . Chronic cough 08/12/2015    Priority: High  . PAD (peripheral artery disease) ([place]) 05/10/2013    Priority: High  . PAF (paroxysmal atrial fibrillation) ([facility])     Priority: High  . History of stroke     Priority: High  . Hyperglycemia 06/25/2014    Priority: Medium  . Hypothyroidism 06/07/2014    Priority: Medium  . Hyperlipidemia 06/07/2014    Priority: Medium  . Hypertension     Priority: Medium  . GERD (gastroesophageal reflux disease) 06/25/2014    Priority: Low  . Vitamin D deficiency 06/25/2014    Priority: Low  . Intracervical pessary 06/07/2014    Priority: Low  . Sinus bradycardia 05/19/2013    Priority: Low  . PAC (premature atrial contraction) 05/20/2011    Priority: Low  . Senile purpura ([place]) 10/11/2018  . CKD (chronic kidney disease), stage III 06/15/2018  . Low vitamin B12 level 12/09/2017  . Anemia 08/16/2017  . Perennial allergic rhinitis 08/12/2015    Medications- reviewed and updated Current Outpatient Medications  Medication Sig Dispense Refill  . ARMOUR THYROID 15 MG tablet TAKE 1 TABLET BY MOUTH EVERY DAY 90 tablet 1  . chlorthalidone (HYGROTON) 25 MG tablet Take 1 tablet (25 mg total) by mouth daily as needed (if blood pressure >140/90 despite avapro/irbesartan). 90 tablet 2  . clopidogrel (PLAVIX) 75 MG tablet TAKE 1 TABLET BY MOUTH EVERY DAY AT NOON 90 tablet 1  . COSOPT 22.3-6.8 MG/ML ophthalmic solution Place 1 drop into both eyes 2 (two) times daily. Reported on 06/21/2015  0  . irbesartan (AVAPRO) 150 MG tablet Take 1 tablet (150 mg total) by  mouth 2 (two) times daily. 180 tablet 2  . rosuvastatin (CRESTOR) 5 MG tablet Take 1 tablet (5 mg total) by mouth once a week. 13 tablet 3   No current facility-administered medications for this visit.      Objective:  BP (!) 154/62   Pulse (!) 52   Temp 98.2 F (36.8 C)   Ht 5\' 3"  (1.6 m)   Wt 119 lb 12.8 oz (54.3 kg)   SpO2 99%   BMI 21.22 kg/m  Gen: NAD, resting comfortably CV: RRR no murmurs rubs or gallops Lungs: CTAB no crackles, wheeze, rhonchi Abdomen: soft/nontender/nondistended/normal bowel sounds.  Ext: trace edema Skin: warm, dry     Assessment and Plan   #hypertension S: compliant with hygroton 25 mg- also supposed to be on iavapro 150mg  BID   pt states her bp this mornin was 175 systolic and diastolic was low but she cant remember. She stopped taking the avapro in the morning for a while b/c she noticed her numbers were low running in the 130s. She started back taking it this morning after 2 days of not taking. BP Readings from Last 3 Encounters:  12/27/18 (!) 154/62  11/15/18 (!) 160/62  10/11/18 (!) 152/64  A/P: Patient's blood pressure was well controlled on Avapro 150 mg and chlorthalidone 25 mg-unfortunately she was concerned that blood pressures in the 130s were too low.  When she stopped  taking her Avapro in the morning blood pressure increased into the 170s-after discussion today on blood pressure goals she is willing to consistently take chlorthalidone 25 mg and Avapro 150 mg twice a day-since we have already confirmed home pressures are controlled on this regimen we opted for 4 or 56-month follow-up- may need to extend further depending on covid 19 situation  #Anemia and leukopenia S: Patient has had anemia mild since 2016 but appears to be slightly worsening.  Also has long-term leukopenia which may be is her baseline.  Patient does report Pinkish discharge from vagina- plans to follow up with GYN.  A/P: Anemia appears to be slightly worsening-repeat CBC  today.  Check ferritin level.  Get pathology smear review given leukopenia.  Also with weight loss could consider hematology consult potentially  #Malnutrition/hypothyroidism S: Thankfully patient has gained 1 pound-Doing [employer] peptide formula- 500 calories in 325 ml/22 oz. Plain flavor  With difficulty gaining weight we also checked in on her compliance with thyroid medication-she is consistent with Armour Thyroid 15 mg A/P: I am thankful patient has gained some weight given low BMI for her age-I encouraged her to continue her high calorie formula at least once a day with goal of at least maintaining weight if not slightly increasing  Due to difficulty gaining weight-we will also check TSH  #hyperlipidemia/history of stroke S: compliant with Plavix.  She states she has taken 2 weeks off of the Crestor for unclear reasons other than she has a strong goal of reducing overall medication burden in general Lab Results  Component Value Date   CHOL 199 07/28/2018   HDL 53.10 07/28/2018   LDLCALC 124 (H) 07/28/2018   TRIG 110.0 07/28/2018   CHOLHDL 4 07/28/2018   A/P: Poor control on last lipid levels-encouraged her to restart rosuvastatin 5 mg once a week-she has been statin intolerant in the past-could also discuss potential referral to cardiology for lipid clinic potentially but she is very leery of any potential medication side effects I am not sure she would be comfortable with injectable cholesterol medicines  Recommended follow up: 4 to 5 months follow-up recommended-unless has new or worsening symptoms Future Appointments  Date Time Provider [place]  03/15/2019  4:20 PM Burnell Blanks, MD [facility]  05/30/2019  1:20 PM [name], MD [facility]    Lab/Order associations:   ICD-10-CM   1. Essential hypertension  I10   2. Anemia, unspecified type  D64.9 CBC with Differential/Platelet    Basic metabolic panel    Fecal occult blood,  imunochemical    IBC + Ferritin    Fecal occult blood, imunochemical  3. Leukopenia, unspecified type  D72.819 Pathologist smear review  4. Hypothyroidism, unspecified type  E03.9 TSH  5. Hyperlipidemia, unspecified hyperlipidemia type  E78.5    Return precautions advised.  Garret Reddish, MD

## 2018-12-28 LAB — IBC + FERRITIN
Ferritin: 98.2 ng/mL (ref 10.0–291.0)
Iron: 56 ug/dL (ref 42–145)
Saturation Ratios: 16.9 % — ABNORMAL LOW (ref 20.0–50.0)
Transferrin: 237 mg/dL (ref 212.0–360.0)

## 2018-12-28 LAB — BASIC METABOLIC PANEL
BUN: 37 mg/dL — ABNORMAL HIGH (ref 6–23)
CO2: 28 mEq/L (ref 19–32)
Calcium: 9.1 mg/dL (ref 8.4–10.5)
Chloride: 105 mEq/L (ref 96–112)
Creatinine, Ser: 1.39 mg/dL — ABNORMAL HIGH (ref 0.40–1.20)
GFR: 43 mL/min — ABNORMAL LOW (ref >60.00)
Glucose, Bld: 114 mg/dL — ABNORMAL HIGH (ref 70–99)
Potassium: 4.4 mEq/L (ref 3.5–5.1)
Sodium: 140 mEq/L (ref 135–145)

## 2018-12-28 LAB — CBC WITH DIFFERENTIAL/PLATELET
Basophils Absolute: 0 10*3/uL (ref 0.0–0.1)
Basophils Relative: 0.6 % (ref 0.0–3.0)
Eosinophils Absolute: 0.1 10*3/uL (ref 0.0–0.7)
Eosinophils Relative: 1.8 % (ref 0.0–5.0)
HCT: 32.6 % — ABNORMAL LOW (ref 36.0–46.0)
Hemoglobin: 10.7 g/dL — ABNORMAL LOW (ref 12.0–15.0)
Lymphocytes Relative: 24.1 % (ref 12.0–46.0)
Lymphs Abs: 0.9 10*3/uL (ref 0.7–4.0)
MCHC: 32.7 g/dL (ref 30.0–36.0)
MCV: 84 fl (ref 78.0–100.0)
Monocytes Absolute: 0.5 10*3/uL (ref 0.1–1.0)
Monocytes Relative: 14.3 % — ABNORMAL HIGH (ref 3.0–12.0)
Neutro Abs: 2.3 10*3/uL (ref 1.4–7.7)
Neutrophils Relative %: 59.2 % (ref 43.0–77.0)
Platelets: 131 10*3/uL — ABNORMAL LOW (ref 150.0–400.0)
RBC: 3.88 Mil/uL (ref 3.87–5.11)
RDW: 14.1 % (ref 11.5–15.5)
WBC: 3.8 10*3/uL — ABNORMAL LOW (ref 4.0–10.5)

## 2018-12-28 LAB — TSH: TSH: 4.26 u[IU]/mL (ref 0.35–4.50)

## 2018-12-28 NOTE — Progress Notes (Signed)
Kidney function well over the last year but slightly worsened from last check-could be from blood pressure running high-lets consistently take the medications as we discussed and we can recheck next visit. Iron stores were okay Anemia was slightly better/more consistent with your baseline which is reassuring.  You also had slightly low platelets.  Your white blood cells continue to be slightly low.  Due to all 3 cell lines being slightly low-I would like to get the hematologist opinion-team please refer her to hematology under pancytopenia.  Even if the pathologist smear review is okay I would just like to play this safe especially with her weight loss Thyroid was normal and not causing weight loss

## 2018-12-29 LAB — PATHOLOGIST SMEAR REVIEW

## 2019-01-05 NOTE — Progress Notes (Signed)
No test results in

## 2019-02-15 ENCOUNTER — Telehealth: Payer: Self-pay | Admitting: Family Medicine

## 2019-02-15 NOTE — Telephone Encounter (Signed)
Copied from [place] [id number]. Topic: General - Other >> Feb 15, 2019 10:08 AM Keene Breath wrote: Reason for CRM: Patient would like the doctor or nurse regarding her BP medication.  CB# [phone]

## 2019-02-15 NOTE — Telephone Encounter (Signed)
Called patient she has had increased blood pressures over the last few days. Her readings before medications in the am are 187/76 and 180/77. After taking medicaitons she is getting 157/61, and 162/71. She denies any sob, leg swelling, cough, headache changes in vision. Patient is taking avapro 150 and hygroton both bid. She sates that she does have some Hydralazine if you want her to add to medications. Do you want patient to come in office?

## 2019-02-15 NOTE — Telephone Encounter (Signed)
See note

## 2019-02-16 NOTE — Telephone Encounter (Signed)
Have her take hydralazine 10mg  twice a day- she has a wellness visit on 12/29 it appears so can be rechecked at that time.

## 2019-02-16 NOTE — Telephone Encounter (Signed)
Called and spoke with pt and gave her below instructions.

## 2019-02-21 ENCOUNTER — Ambulatory Visit (INDEPENDENT_AMBULATORY_CARE_PROVIDER_SITE_OTHER): Payer: Medicare Other

## 2019-02-21 ENCOUNTER — Telehealth: Payer: Self-pay | Admitting: Family Medicine

## 2019-02-21 ENCOUNTER — Other Ambulatory Visit: Payer: Self-pay

## 2019-02-21 DIAGNOSIS — Z0000 Encounter for general adult medical examination without abnormal findings: Secondary | ICD-10-CM

## 2019-02-21 NOTE — Telephone Encounter (Signed)
Pt returned call after hrs, pls call back for appt, no availability for appt/pec

## 2019-02-21 NOTE — Progress Notes (Addendum)
This visit is being conducted via phone call due to the COVID-19 pandemic. This patient has given me verbal consent via phone to conduct this visit, patient states they are participating from their home address. Some vital signs may be absent or patient reported.   Patient identification: identified by name, DOB, and current address.  Location provider: [facility], Office Persons participating in the virtual visit: Denman George LPN, patient, and Inda Coke PA    Subjective:   [name] is a 85 y.o. female who presents for Medicare Annual (Subsequent) preventive examination.  Review of Systems:   Cardiac Risk Factors include: advanced age (>71mn, >>77women);hypertension    Objective:     Vitals: There were no vitals taken for this visit.  There is no height or weight on file to calculate BMI.  Advanced Directives 02/21/2019 11/18/2017 06/25/2016 10/11/2014 05/15/2013 05/15/2013 04/01/2011  Does Patient Have a Medical Advance Directive? Yes Yes Yes No Patient has advance directive, copy not in chart Patient has advance directive, copy not in chart Patient has advance directive, copy not in chart  Type of Advance Directive Living will;Healthcare Power of [place]  Does patient want to make changes to medical advance directive? No - Patient declined - - - No change requested - -  [place] of [place]? No - copy requested - - - Copy requested from other (Comment) - Copy requested from family  Would patient like information on creating a medical advance directive? - - - No - patient declined information - - -  Pre-existing out of facility DNR order (yellow form or pink MOST form) - - - - - - No    Tobacco Social History   Tobacco Use  Smoking Status Never Smoker  Smokeless Tobacco Never Used     Counseling given: Not Answered   Clinical Intake:  Pre-visit preparation completed: Yes  Pain : No/denies pain  Diabetes: No   How often do you need to have someone help you when you read instructions, pamphlets, or other written materials from your doctor or pharmacy?: 2 - Rarely  Interpreter Needed?: No  Information entered by :: CDenman GeorgeLPN  Past Medical History:  Diagnosis Date  . Arthritis   . Chicken pox   . Glaucoma of both eyes   . High cholesterol   . Hypertension    a. Normal renal arteries by duplex 2013.  .Marland KitchenHypothyroidism   . Migraine   . PAD (peripheral artery disease) ([place]    a. 04/2013: s/p PCI to occluded right popliteal artery   . PAF (paroxysmal atrial fibrillation) ([place]    a. s/p MAZE 2006. b. Event monitor 2013: NSR with PACs.  . Sinus bradycardia    a. 03/2011 during hospital admission - beta blocker stopped.  . Stroke ([facility]    a. Pt reports 3-4 prior strokes without residual sx. b. CVA 2013 (presented with headache) - started on Plavix. Event monitor as outpt - no AF.   Past Surgical History:  Procedure Laterality Date  . CATARACT EXTRACTION Left 11/09/2017  . LOWER EXTREMITY ANGIOGRAM N/A 05/15/2013   Procedure: LOWER EXTREMITY ANGIOGRAM;  Surgeon: JLorretta Harp MD;  [facility];  Service: Cardiovascular;  Laterality: N/A;  . MAZE  ~ 2006  . POPLITEAL ARTERY STENT  05/15/2013   Family History  Problem Relation Age of Onset  . Hypertension Mother   . Ulcers Father   .  Cervical cancer Sister   . Throat cancer Brother   . Lung cancer Brother   . Cancer Brother   . Allergic rhinitis Neg Hx   . Angioedema Neg Hx   . Asthma Neg Hx   . Atopy Neg Hx   . Eczema Neg Hx   . Immunodeficiency Neg Hx   . Urticaria Neg Hx    Social History   Socioeconomic History  . Marital status: Married    Spouse name: Not on file  . Number of children: Not on file  . Years of education: Not on file  . Highest education level: Not on file  Occupational History  . Not on file  Tobacco Use  . Smoking status: Never Smoker  . Smokeless tobacco: Never Used  Substance and  Sexual Activity  . Alcohol use: No    Alcohol/week: 0.0 standard drinks  . Drug use: No  . Sexual activity: Never  Other Topics Concern  . Not on file  Social History Narrative   Married. Lives with husband. Married 65 years in 2016. [place] 22 years in 2016. 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 6 greatgrandkids.    Finished 10th grade.       Retired from working in [employer] for [employer]         Social Determinants of Radio broadcast assistant Strain:   . Difficulty of Paying Living Expenses: Not on file  Food Insecurity:   . Worried About Charity fundraiser in the Last Year: Not on file  . Ran Out of Food in the Last Year: Not on file  Transportation Needs:   . Lack of Transportation (Medical): Not on file  . Lack of Transportation (Non-Medical): Not on file  Physical Activity:   . Days of Exercise per Week: Not on file  . Minutes of Exercise per Session: Not on file  Stress:   . Feeling of Stress : Not on file  Social Connections:   . Frequency of Communication with Friends and Family: Not on file  . Frequency of Social Gatherings with Friends and Family: Not on file  . Attends Religious Services: Not on file  . Active Member of Clubs or Organizations: Not on file  . Attends Archivist Meetings: Not on file  . Marital Status: Not on file    Outpatient Encounter Medications as of 02/21/2019  Medication Sig  . ARMOUR THYROID 15 MG tablet TAKE 1 TABLET BY MOUTH EVERY DAY  . chlorthalidone (HYGROTON) 25 MG tablet Take 1 tablet (25 mg total) by mouth daily as needed (if blood pressure >140/90 despite avapro/irbesartan).  . clopidogrel (PLAVIX) 75 MG tablet TAKE 1 TABLET BY MOUTH EVERY DAY AT NOON  . COSOPT 22.3-6.8 MG/ML ophthalmic solution Place 1 drop into both eyes 2 (two) times daily. Reported on 06/21/2015  . irbesartan (AVAPRO) 150 MG tablet Take 1 tablet (150 mg total) by mouth 2 (two) times daily.  . rosuvastatin  (CRESTOR) 5 MG tablet Take 1 tablet (5 mg total) by mouth once a week.   No facility-administered encounter medications on file as of 02/21/2019.    Activities of Daily Living In your present state of health, do you have any difficulty performing the following activities: 02/21/2019  Hearing? N  Vision? N  Difficulty concentrating or making decisions? N  Walking or climbing stairs? N  Dressing or bathing? N  Doing errands, shopping? N  Preparing Food and eating ? N  Using the Toilet? N  In the past six months, have you accidently leaked urine? N  Do you have problems with loss of bowel control? N  Managing your Medications? N  Managing your Finances? N  Housekeeping or managing your Housekeeping? N  Some recent data might be hidden    Patient Care Team: [name], MD as PCP - General (Family Medicine) Burnell Blanks, MD as PCP - Cardiology (Cardiology) Lorretta Harp, MD as Consulting Physician (Peripheral Vascular Disease) Rozetta Nunnery, MD as Consulting Physician (Otolaryngology) Cheri Fowler, MD as Consulting Physician (Obstetrics and Gynecology) Sherlynn Stalls, MD as Consulting Physician (Ophthalmology)    Assessment:   This is a routine wellness examination for [place].  Exercise Activities and Dietary recommendations Current Exercise Habits: The patient does not participate in regular exercise at present  Goals    . Exercise 150 min/wk Moderate Activity     Wants to walk again. 5 days a week will help Will start back to Tai chi which will help your balance     . Exercise 150 minutes per week (moderate activity)     Will check out the senior center  [address], [place], [place] [address] Phone: [phone]  Senior center [place], [place] [address] Directions  [phone]        Fall Risk Fall Risk  02/21/2019 11/15/2018 10/11/2018 11/18/2017 07/16/2017  Falls in the past year? 0 0 0 No No  Number falls in  past yr: - 0 0 - -  Injury with Fall? 0 0 0 - -  Follow up Falls evaluation completed;Education provided;Falls prevention discussed - - - -   Is the patient's home free of loose throw rugs in walkways, pet beds, electrical cords, etc?   yes      Grab bars in the bathroom? yes      Handrails on the stairs?   yes      Adequate lighting?   yes  Depression Screen PHQ 2/9 Scores 02/21/2019 12/27/2018 11/15/2018 10/11/2018  PHQ - 2 Score 0 0 0 0     Cognitive Function- no cognitive concerns at this time  MMSE - Mini Mental State Exam 11/18/2017 06/25/2016  Not completed: (No Data) (No Data)     6CIT Screen 02/21/2019  What Year? 0 points  What month? 0 points  What time? 0 points  Count back from 20 0 points  Months in reverse 0 points  Repeat phrase 2 points  Total Score 2     There is no immunization history on file for this patient. Patient declines all vaccines at this time   Qualifies for Shingles Vaccine?Discussed and patient will check with pharmacy for coverage.  Patient education handout provided   Screening Tests Health Maintenance  Topic Date Due  . TETANUS/TDAP  01/02/1945  . PNA vac Low Risk Adult (1 of 2 - PCV13) 01/01/1993  . INFLUENZA VACCINE  05/24/2019 (Originally 09/24/2018)  . DEXA SCAN  Completed    Cancer Screenings: Lung: Low Dose CT Chest recommended if Age 39-80 years, 30 pack-year currently smoking OR have quit w/in 15years. Patient does not qualify. Breast:  Up to date on Mammogram? Yes   Up to date of Bone Density/Dexa? Yes Colorectal: No longer indicated      Plan:  I have personally reviewed and addressed the Medicare Annual Wellness questionnaire and have noted the following in the patient's chart:  A. Medical and social history B. Use of alcohol, tobacco  or illicit drugs  C. Current medications and supplements D. Functional ability and status E.  Nutritional status F.  Physical activity G. Advance directives H. List of other physicians I.   Hospitalizations, surgeries, and ER visits in previous 12 months J.  [place] such as hearing and vision if needed, cognitive and depression L. Referrals, records requested, and appointments- none   In addition, I have reviewed and discussed with patient certain preventive protocols, quality metrics, and best practice recommendations. A written personalized care plan for preventive services as well as general preventive health recommendations were provided to patient.   Signed,  Denman George, LPN  Nurse Health Advisor   Nurse Notes: Patient states that she would like to come in for a office visit for re evaluation of blood pressure.  Please advise. No new complaints and is compliant with medications   I have reviewed documentation for AWV and Advance Care planning provided by [facility], I agree with documentation, I was immediately available for any questions. Inda Coke, [place]

## 2019-02-21 NOTE — Telephone Encounter (Signed)
Called pt to schedule appt with Dr. Yong Channel about BP

## 2019-02-21 NOTE — Patient Instructions (Signed)
[name] , Thank you for taking time to come for your Medicare Wellness Visit. I appreciate your ongoing commitment to your health goals. Please review the following plan we discussed and let me know if I can assist you in the future.   Screening recommendations/referrals: Colorectal Screening: No longer indicated Mammogram: No longer indicated Bone Density: No longer indicated   Vision and Dental Exams: Recommended annual ophthalmology exams for early detection of glaucoma and other disorders of the eye Recommended annual dental exams for proper oral hygiene  Vaccinations: Influenza vaccine:  recommended this fall either at PCP office or through your local pharmacy  Pneumococcal vaccine: recommended  Tdap vaccine: recommended; Please call your insurance company to determine your out of pocket expense. You may receive this vaccine at your local pharmacy or Health Dept. Shingles vaccine: Please call your insurance company to determine your out of pocket expense for the Shingrix vaccine. You may receive this vaccine at your local pharmacy.  Advanced directives: Please bring a copy of your POA (Power of Attorney) and/or Living Will to your next appointment.  Goals: Recommend to drink at least 6-8 8oz glasses of water per day and consume a balanced diet with plenty fruits and vegetables.   Next appointment: Please schedule your Annual Wellness Visit with your Nurse Health Advisor in one year.  Preventive Care 32 Years and Older, Female Preventive care refers to lifestyle choices and visits with your health care provider that can promote health and wellness. What does preventive care include?  A yearly physical exam. This is also called an annual well check.  Dental exams once or twice a year.  Routine eye exams. Ask your health care provider how often you should have your eyes checked.  Personal lifestyle choices, including:  Daily care of your teeth and gums.  Regular physical  activity.  Eating a healthy diet.  Avoiding tobacco and drug use.  Limiting alcohol use.  Practicing safe sex.  Taking low-dose aspirin every day if recommended by your health care provider.  Taking vitamin and mineral supplements as recommended by your health care provider. What happens during an annual well check? The services and screenings done by your health care provider during your annual well check will depend on your age, overall health, lifestyle risk factors, and family history of disease. Counseling  Your health care provider may ask you questions about your:  Alcohol use.  Tobacco use.  Drug use.  Emotional well-being.  Home and relationship well-being.  Sexual activity.  Eating habits.  History of falls.  Memory and ability to understand (cognition).  Work and work Statistician.  Reproductive health. Screening  You may have the following tests or measurements:  Height, weight, and BMI.  Blood pressure.  Lipid and cholesterol levels. These may be checked every 5 years, or more frequently if you are over 83 years old.  Skin check.  Lung cancer screening. You may have this screening every year starting at age 83 if you have a 30-pack-year history of smoking and currently smoke or have quit within the past 15 years.  Fecal occult blood test (FOBT) of the stool. You may have this test every year starting at age 83.  Flexible sigmoidoscopy or colonoscopy. You may have a sigmoidoscopy every 5 years or a colonoscopy every 10 years starting at age 83.  Hepatitis C blood test.  Hepatitis B blood test.  Sexually transmitted disease (STD) testing.  Diabetes screening. This is done by checking your blood sugar (glucose)  after you have not eaten for a while (fasting). You may have this done every 1-3 years.  Bone density scan. This is done to screen for osteoporosis. You may have this done starting at age 83.  Mammogram. This may be done every 1-2  years. Talk to your health care provider about how often you should have regular mammograms. Talk with your health care provider about your test results, treatment options, and if necessary, the need for more tests. Vaccines  Your health care provider may recommend certain vaccines, such as:  Influenza vaccine. This is recommended every year.  Tetanus, diphtheria, and acellular pertussis (Tdap, Td) vaccine. You may need a Td booster every 10 years.  Zoster vaccine. You may need this after age 83.  Pneumococcal 13-valent conjugate (PCV13) vaccine. One dose is recommended after age 83.  Pneumococcal polysaccharide (PPSV23) vaccine. One dose is recommended after age 2. Talk to your health care provider about which screenings and vaccines you need and how often you need them. This information is not intended to replace advice given to you by your health care provider. Make sure you discuss any questions you have with your health care provider. Document Released: 03/08/2015 Document Revised: 10/30/2015 Document Reviewed: 12/11/2014 Elsevier Interactive Patient Education  2017 [place] Prevention in the Home Falls can cause injuries. They can happen to people of all ages. There are many things you can do to make your home safe and to help prevent falls. What can I do on the outside of my home?  Regularly fix the edges of walkways and driveways and fix any cracks.  Remove anything that might make you trip as you walk through a door, such as a raised step or threshold.  Trim any bushes or trees on the path to your home.  Use bright outdoor lighting.  Clear any walking paths of anything that might make someone trip, such as rocks or tools.  Regularly check to see if handrails are loose or broken. Make sure that both sides of any steps have handrails.  Any raised decks and porches should have guardrails on the edges.  Have any leaves, snow, or ice cleared regularly.  Use  sand or salt on walking paths during winter.  Clean up any spills in your garage right away. This includes oil or grease spills. What can I do in the bathroom?  Use night lights.  Install grab bars by the toilet and in the tub and shower. Do not use towel bars as grab bars.  Use non-skid mats or decals in the tub or shower.  If you need to sit down in the shower, use a plastic, non-slip stool.  Keep the floor dry. Clean up any water that spills on the floor as soon as it happens.  Remove soap buildup in the tub or shower regularly.  Attach bath mats securely with double-sided non-slip rug tape.  Do not have throw rugs and other things on the floor that can make you trip. What can I do in the bedroom?  Use night lights.  Make sure that you have a light by your bed that is easy to reach.  Do not use any sheets or blankets that are too big for your bed. They should not hang down onto the floor.  Have a firm chair that has side arms. You can use this for support while you get dressed.  Do not have throw rugs and other things on the floor that can make you  trip. What can I do in the kitchen?  Clean up any spills right away.  Avoid walking on wet floors.  Keep items that you use a lot in easy-to-reach places.  If you need to reach something above you, use a strong step stool that has a grab bar.  Keep electrical cords out of the way.  Do not use floor polish or wax that makes floors slippery. If you must use wax, use non-skid floor wax.  Do not have throw rugs and other things on the floor that can make you trip. What can I do with my stairs?  Do not leave any items on the stairs.  Make sure that there are handrails on both sides of the stairs and use them. Fix handrails that are broken or loose. Make sure that handrails are as long as the stairways.  Check any carpeting to make sure that it is firmly attached to the stairs. Fix any carpet that is loose or worn.  Avoid  having throw rugs at the top or bottom of the stairs. If you do have throw rugs, attach them to the floor with carpet tape.  Make sure that you have a light switch at the top of the stairs and the bottom of the stairs. If you do not have them, ask someone to add them for you. What else can I do to help prevent falls?  Wear shoes that:  Do not have high heels.  Have rubber bottoms.  Are comfortable and fit you well.  Are closed at the toe. Do not wear sandals.  If you use a stepladder:  Make sure that it is fully opened. Do not climb a closed stepladder.  Make sure that both sides of the stepladder are locked into place.  Ask someone to hold it for you, if possible.  Clearly mark and make sure that you can see:  Any grab bars or handrails.  First and last steps.  Where the edge of each step is.  Use tools that help you move around (mobility aids) if they are needed. These include:  Canes.  Walkers.  Scooters.  Crutches.  Turn on the lights when you go into a dark area. Replace any light bulbs as soon as they burn out.  Set up your furniture so you have a clear path. Avoid moving your furniture around.  If any of your floors are uneven, fix them.  If there are any pets around you, be aware of where they are.  Review your medicines with your doctor. Some medicines can make you feel dizzy. This can increase your chance of falling. Ask your doctor what other things that you can do to help prevent falls. This information is not intended to replace advice given to you by your health care provider. Make sure you discuss any questions you have with your health care provider. Document Released: 12/06/2008 Document Revised: 07/18/2015 Document Reviewed: 03/16/2014 Elsevier Interactive Patient Education  2017 [employer].

## 2019-02-22 NOTE — Telephone Encounter (Signed)
See note

## 2019-02-22 NOTE — Telephone Encounter (Signed)
Appt scheduled to see Dr. Yong Channel on 1/4

## 2019-02-27 ENCOUNTER — Ambulatory Visit (INDEPENDENT_AMBULATORY_CARE_PROVIDER_SITE_OTHER): Payer: Medicare Other | Admitting: Family Medicine

## 2019-02-27 ENCOUNTER — Other Ambulatory Visit: Payer: Self-pay

## 2019-02-27 ENCOUNTER — Encounter: Payer: Self-pay | Admitting: Family Medicine

## 2019-02-27 VITALS — BP 158/60 | HR 48 | Temp 98.4°F | Ht 63.0 in | Wt 121.2 lb

## 2019-02-27 DIAGNOSIS — R05 Cough: Secondary | ICD-10-CM

## 2019-02-27 DIAGNOSIS — I739 Peripheral vascular disease, unspecified: Secondary | ICD-10-CM | POA: Diagnosis not present

## 2019-02-27 DIAGNOSIS — R053 Chronic cough: Secondary | ICD-10-CM

## 2019-02-27 DIAGNOSIS — I10 Essential (primary) hypertension: Secondary | ICD-10-CM

## 2019-02-27 MED ORDER — HYDRALAZINE HCL 10 MG PO TABS: 10.0000 mg | ORAL_TABLET | Freq: Three times a day (TID) | ORAL | 3 refills | Status: DC

## 2019-02-27 MED ORDER — CHLORTHALIDONE 25 MG PO TABS: 25.0000 mg | ORAL_TABLET | Freq: Every day | ORAL | 3 refills | Status: DC

## 2019-02-27 MED ORDER — CLOPIDOGREL BISULFATE 75 MG PO TABS: ORAL_TABLET | ORAL | 3 refills | Status: DC

## 2019-02-27 NOTE — Patient Instructions (Addendum)
Health Maintenance Due  Topic Date Due  . TETANUS/TDAP - need this if you get a cut/scrape 01/02/1945  . PNA vac Low Risk Adult (1 of 2 - PCV13)- you declined 01/01/1993   Poor control today of blood pressure. Continue irbesartan 150mg  twice a day. Continue chlorthalidone but only take 25 mg in the morning. Increase hydralazine to three times a day. If still high in 7-14 days she will let me know and may try a half tablet of amlodipine 2.5 mg. In the past she has had swelling in legs on this but not this low a  Dose.    Recommended follow up: *4-6 week in person recheck or sooner if you need [place]

## 2019-02-27 NOTE — Progress Notes (Signed)
Phone [phone] In person visit   Subjective:   [name] is a 86 y.o. year old very pleasant female patient who presents for/with See problem oriented charting Chief Complaint  Patient presents with  . Hypertension   ROS- No chest pain or shortness of breath. No headache or blurry vision.    This visit occurred during the SARS-CoV-2 public health emergency.  Safety protocols were in place, including screening questions prior to the visit, additional usage of staff PPE, and extensive cleaning of exam room while observing appropriate contact time as indicated for disinfecting solutions.   Past Medical History-  Patient Active Problem List   Diagnosis Date Noted  . Chronic cough 08/12/2015    Priority: High  . PAD (peripheral artery disease) ([place]) 05/10/2013    Priority: High  . PAF (paroxysmal atrial fibrillation) ([facility])     Priority: High  . History of stroke     Priority: High  . Hyperglycemia 06/25/2014    Priority: Medium  . Hypothyroidism 06/07/2014    Priority: Medium  . Hyperlipidemia 06/07/2014    Priority: Medium  . Hypertension     Priority: Medium  . GERD (gastroesophageal reflux disease) 06/25/2014    Priority: Low  . Vitamin D deficiency 06/25/2014    Priority: Low  . Intracervical pessary 06/07/2014    Priority: Low  . Sinus bradycardia 05/19/2013    Priority: Low  . PAC (premature atrial contraction) 05/20/2011    Priority: Low  . Senile purpura ([place]) 10/11/2018  . CKD (chronic kidney disease), stage III 06/15/2018  . Low vitamin B12 level 12/09/2017  . Anemia 08/16/2017  . Perennial allergic rhinitis 08/12/2015    Medications- reviewed and updated Current Outpatient Medications  Medication Sig Dispense Refill  . ARMOUR THYROID 15 MG tablet TAKE 1 TABLET BY MOUTH EVERY DAY 90 tablet 1  . chlorthalidone (HYGROTON) 25 MG tablet Take 1 tablet (25 mg total) by mouth daily. 90 tablet 3  . clopidogrel (PLAVIX) 75 MG tablet TAKE 1 TABLET BY MOUTH  EVERY DAY AT NOON 90 tablet 3  . COSOPT 22.3-6.8 MG/ML ophthalmic solution Place 1 drop into both eyes 2 (two) times daily. Reported on 06/21/2015  0  . hydrALAZINE (APRESOLINE) 10 MG tablet Take 1 tablet (10 mg total) by mouth 3 (three) times daily. 270 tablet 3  . irbesartan (AVAPRO) 150 MG tablet Take 1 tablet (150 mg total) by mouth 2 (two) times daily. 180 tablet 2  . rosuvastatin (CRESTOR) 5 MG tablet Take 1 tablet (5 mg total) by mouth once a week. 13 tablet 3   No current facility-administered medications for this visit.     Objective:  BP (!) 158/60 (BP Location: Left Arm, Patient Position: Sitting, Cuff Size: Normal)   Pulse (!) 48   Temp 98.4 F (36.9 C) (Temporal)   Ht 5\' 3"  (1.6 m)   Wt 121 lb 4 oz (55 kg)   SpO2 98%   BMI 21.48 kg/m  Gen: NAD, resting comfortably CV: RRR no murmurs rubs or gallops Lungs: CTAB no crackles, wheeze, rhonchi Ext: no edema Skin: warm, dry    Assessment and Plan   #hypertension S: compliant with irbesartan/avapro 150 mg twice daily.  She has increased her chlorthalidone to 25 mg twice a day-she thought we had advised this but I told her typically I do not go above 25 mg a day and discussed BID dosing was for avapro. She is also on hydralazine 10 mg twice a day.  Home #s in mornings can be 160s or 17s and in mid day 150s.   Prior had gone down to 150mg  alone daily with dietary changes but was losing too much weight.  BP Readings from Last 3 Encounters:  02/27/19 (!) 158/60  12/27/18 (!) 154/62  11/15/18 (!) 160/62  A/P: Poor control today of blood pressure. Continue irbesartan 150mg  twice a day. Continue chlorthalidone but only take 25 mg in the morning. Increase hydralazine to three times a day. If still high in 7-14 days she will let me know and may try a half tablet of amlodipine 2.5 mg. In the past she has had swelling in legs on this but not this low a  Dose.   Also her cough has come back with loosening up diet- diet changes had  helped her cough and BP tremendously previously so she is trying to restart.  Recurrence of chronic cough- last CXR 07/09/17- consider CXR at follow up if not improving with her dietary changes  #Peripheral arterial disease-patient is running on Plavix and asked for refill today.  Previously followed by Dr. Casimer Leek back last cardiology visit was November 2019 and last visit directly with Dr. Gwenlyn Found was January 2018.  At follow-up we will likely recommend follow-up with cardiology -With worsening blood pressure does make me concerned about possible renal artery stenosis though this would have to be bilateral-could consider imaging of renal artery  Recommended follow up: 1 to 2 weeks update then see me in person in 4 to 6 weeks or sooner if needed Future Appointments  Date Time Provider [place]  03/15/2019  4:20 PM Burnell Blanks, MD [facility]  05/30/2019  1:20 PM [name], MD [facility]    Lab/Order associations:   ICD-10-CM   1. Chronic cough  R05   2. PAD (peripheral artery disease) ([facility])  I73.9 clopidogrel (PLAVIX) 75 MG tablet  3. Essential hypertension  I10     Meds ordered this encounter  Medications  . clopidogrel (PLAVIX) 75 MG tablet    Sig: TAKE 1 TABLET BY MOUTH EVERY DAY AT NOON    Dispense:  90 tablet    Refill:  3  . chlorthalidone (HYGROTON) 25 MG tablet    Sig: Take 1 tablet (25 mg total) by mouth daily.    Dispense:  90 tablet    Refill:  3    Due to communication error had been on twice a day instead of daily.  Please refill this for her at present.  . hydrALAZINE (APRESOLINE) 10 MG tablet    Sig: Take 1 tablet (10 mg total) by mouth 3 (three) times daily.    Dispense:  270 tablet    Refill:  3   Return precautions advised.  Garret Reddish, MD

## 2019-03-01 ENCOUNTER — Telehealth: Payer: Self-pay | Admitting: Family Medicine

## 2019-03-01 NOTE — Telephone Encounter (Signed)
Pt called stating that she is not able to get her BP medicine refilled due to running out of pills early. Pt stated Dr. Yong Channel told her to take another pill in the evening if her BP is high, so she ran out of pills. Pt tried to go get med refilled but insurance will not allow it. Pt will be without medicine for a few days and asked if the nurse could call her. Please advise.

## 2019-03-02 NOTE — Telephone Encounter (Signed)
See signature on chlorthalidone-I was trying to get him to refill it early because she had been taking twice a day-I do not recall telling her to take it twice a day but there may have been a miscommunication.  Please talk to pharmacy and see what we need to do to get this refilled-I tried to write in the signature to refill it early as noted.  She should only be on this once a day

## 2019-03-02 NOTE — Telephone Encounter (Signed)
Happy to call in new script so she can get filled just let me know what needs to be in sig

## 2019-03-03 ENCOUNTER — Other Ambulatory Visit: Payer: Self-pay

## 2019-03-03 MED ORDER — CHLORTHALIDONE 25 MG PO TABS: 25.0000 mg | ORAL_TABLET | Freq: Every day | ORAL | 0 refills | Status: DC

## 2019-03-03 NOTE — Telephone Encounter (Signed)
Please send in chlorthalidone 25 mg twice daily #60 with 0 refills.  After patient has filled this please change prescription back to chlorthalidone 25 mg daily #90 with 3 refills.  Please make sure patient continues to only take this once a day-this is a short-term prescription to reflect how she was taking additional doses so insurance will pay for that time-it would be unsafe for patient to discontinue medicine and I am concerned if it is not paid for that she will hold off on taking the medicine

## 2019-03-03 NOTE — Telephone Encounter (Signed)
Pharmacy will fill but insurance will not pay if it has not been 30 days or if change in medication

## 2019-03-03 NOTE — Telephone Encounter (Signed)
PT called and notified of below.

## 2019-03-09 DIAGNOSIS — N8189 Other female genital prolapse: Secondary | ICD-10-CM | POA: Diagnosis not present

## 2019-03-15 ENCOUNTER — Ambulatory Visit (INDEPENDENT_AMBULATORY_CARE_PROVIDER_SITE_OTHER): Payer: Medicare Other | Admitting: Cardiovascular Disease

## 2019-03-15 ENCOUNTER — Other Ambulatory Visit: Payer: Self-pay

## 2019-03-15 ENCOUNTER — Encounter: Payer: Self-pay | Admitting: Cardiovascular Disease

## 2019-03-15 VITALS — BP 160/60 | HR 68 | Ht 63.0 in | Wt 121.8 lb

## 2019-03-15 DIAGNOSIS — I10 Essential (primary) hypertension: Secondary | ICD-10-CM

## 2019-03-15 DIAGNOSIS — I739 Peripheral vascular disease, unspecified: Secondary | ICD-10-CM | POA: Diagnosis not present

## 2019-03-15 DIAGNOSIS — I491 Atrial premature depolarization: Secondary | ICD-10-CM

## 2019-03-15 MED ORDER — HYDRALAZINE HCL 25 MG PO TABS: 25.0000 mg | ORAL_TABLET | Freq: Three times a day (TID) | ORAL | 1 refills | Status: DC

## 2019-03-15 NOTE — Patient Instructions (Signed)
Medication Instructions:  Your physician has recommended you make the following change in your medication:  1.) increase hydralazine to 25 mg three times a day  *If you need a refill on your cardiac medications before your next appointment, please call your pharmacy*  Lab Work: none If you have labs (blood work) drawn today and your tests are completely normal, you will receive your results only by: [name] MyChart Message (if you have MyChart) OR . A paper copy in the mail If you have any lab test that is abnormal or we need to change your treatment, we will call you to review the results.  Testing/Procedures: none  Follow-Up: At [facility], you and your health needs are our priority.  As part of our continuing mission to provide you with exceptional heart care, we have created designated Provider Care Teams.  These Care Teams include your primary Cardiologist (physician) and Advanced Practice Providers (APPs -  Physician Assistants and Nurse Practitioners) who all work together to provide you with the care you need, when you need it.  Your next appointment:   12 month(s)  The format for your next appointment:   Either In Person or Virtual  Provider:   You may see Lauree Chandler, MD or one of the following Advanced Practice Providers on your designated Care Team:    Melina Copa, PA-C  Ermalinda Barrios, PA-C   Other Instructions

## 2019-03-15 NOTE — Progress Notes (Signed)
Chief Complaint  Patient presents with  . Follow-up    CAD   History of Present Illness: 86 yo female with history of PVCs, PACs, HTN, CVA, PAF s/p MAZE 2006, PAD who is here today for cardiac follow up. I have followed her for atrial fibrillation and HTN. Her HTN has been difficult to control over the years. She has not tolerated many medications. She has most recently tolerated Avapro, chlorthalidone and hydralazine well. She has not tolerated Norvasc, Coreg, Bystolic or clonidine. I met her at [facility] in February 2013 when she was admitted with a headache and found to have sinus brady with elevated BP. She had initially been tried on Bystolic but did not tolerate due to bradycardia. She was started on Norvasc in the hospital but had burning in her legs so she stopped it. Echo 04/02/11 with normal LV size and function. Event monitor in 2013 showed NSR with PACs. No evidence of atrial fib. Renal artery dopplers November 2013 without evidence of renal artery stenosis. She has PAD followed by Dr. Gwenlyn Found and underwent successful balloon/stenting of an occluded right popliteal artery per Dr. Gwenlyn Found in 2015. Her PAD was stable at last check with Dr. Gwenlyn Found in January 2019. Echo June 2015 at [facility] with normal LV function. Cardiac monitor October 2017 with PVCs and PACs, no atrial fib.   She is here today for follow up. The patient denies any chest pain, dyspnea, palpitations, lower extremity edema, orthopnea, PND, dizziness, near syncope or syncope. BP has been up at home.   Primary Care Physician:  [name], MD  Past Medical History:  Diagnosis Date  . Arthritis   . Chicken pox   . Glaucoma of both eyes   . High cholesterol   . Hypertension    a. Normal renal arteries by duplex 2013.  [name] Hypothyroidism   . Migraine   . PAD (peripheral artery disease) ([place])    a. 04/2013: s/p PCI to occluded right popliteal artery   . PAF (paroxysmal atrial fibrillation) ([place])    a. s/p MAZE  2006. b. Event monitor 2013: NSR with PACs.  . Sinus bradycardia    a. 03/2011 during hospital admission - beta blocker stopped.  . Stroke [facility])    a. Pt reports 3-4 prior strokes without residual sx. b. CVA 2013 (presented with headache) - started on Plavix. Event monitor as outpt - no AF.    Past Surgical History:  Procedure Laterality Date  . CATARACT EXTRACTION Left 11/09/2017  . LOWER EXTREMITY ANGIOGRAM N/A 05/15/2013   Procedure: LOWER EXTREMITY ANGIOGRAM;  Surgeon: Lorretta Harp, MD;  Location: [facility] CATH LAB;  Service: Cardiovascular;  Laterality: N/A;  . MAZE  ~ 2006  . POPLITEAL ARTERY STENT  05/15/2013    Current Outpatient Medications  Medication Sig Dispense Refill  . ARMOUR THYROID 15 MG tablet TAKE 1 TABLET BY MOUTH EVERY DAY 90 tablet 1  . chlorthalidone (HYGROTON) 25 MG tablet Take 1 tablet (25 mg total) by mouth daily. 60 tablet 0  . clopidogrel (PLAVIX) 75 MG tablet TAKE 1 TABLET BY MOUTH EVERY DAY AT NOON 90 tablet 3  . COSOPT 22.3-6.8 MG/ML ophthalmic solution Place 1 drop into both eyes 2 (two) times daily. Reported on 06/21/2015  0  . irbesartan (AVAPRO) 150 MG tablet Take 1 tablet (150 mg total) by mouth 2 (two) times daily. 180 tablet 2  . rosuvastatin (CRESTOR) 5 MG tablet Take 1 tablet (5 mg total) by mouth once  a week. 13 tablet 3  . hydrALAZINE (APRESOLINE) 25 MG tablet Take 1 tablet (25 mg total) by mouth 3 (three) times daily. 270 tablet 1   No current facility-administered medications for this visit.    Allergies  Allergen Reactions  . Aceon [Perindopril Erbumine]     cough  . Amlodipine     Lower extremity discomfort  . Aspirin Cough  . Azor [Amlodipine-Olmesartan] Cough  . Beta Adrenergic Blockers     Bradycardia  . Hydralazine Nausea And Vomiting and Other (See Comments)    Weakness   . Sulfamethoxazole-Trimethoprim Other (See Comments)    Unknown reaction  . Vitamin D Analogs Cough    Social History   Socioeconomic History  . Marital  status: Married    Spouse name: Not on file  . Number of children: Not on file  . Years of education: Not on file  . Highest education level: Not on file  Occupational History  . Not on file  Tobacco Use  . Smoking status: Never Smoker  . Smokeless tobacco: Never Used  Substance and Sexual Activity  . Alcohol use: No    Alcohol/week: 0.0 standard drinks  . Drug use: No  . Sexual activity: Never  Other Topics Concern  . Not on file  Social History Narrative   Married. Lives with husband. Married 65 years in 2016. [place] 22 years in 2016. 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 6 greatgrandkids.    Finished 10th grade.       Retired from working in [employer] for [employer]         Social Determinants of Radio broadcast assistant Strain:   . Difficulty of Paying Living Expenses: Not on file  Food Insecurity:   . Worried About Charity fundraiser in the Last Year: Not on file  . Ran Out of Food in the Last Year: Not on file  Transportation Needs:   . Lack of Transportation (Medical): Not on file  . Lack of Transportation (Non-Medical): Not on file  Physical Activity:   . Days of Exercise per Week: Not on file  . Minutes of Exercise per Session: Not on file  Stress:   . Feeling of Stress : Not on file  Social Connections:   . Frequency of Communication with Friends and Family: Not on file  . Frequency of Social Gatherings with Friends and Family: Not on file  . Attends Religious Services: Not on file  . Active Member of Clubs or Organizations: Not on file  . Attends Archivist Meetings: Not on file  . Marital Status: Not on file  Intimate Partner Violence:   . Fear of Current or Ex-Partner: Not on file  . Emotionally Abused: Not on file  . Physically Abused: Not on file  . Sexually Abused: Not on file    Family History  Problem Relation Age of Onset  . Hypertension Mother   . Ulcers Father   . Cervical cancer Sister     . Throat cancer Brother   . Lung cancer Brother   . Cancer Brother   . Allergic rhinitis Neg Hx   . Angioedema Neg Hx   . Asthma Neg Hx   . Atopy Neg Hx   . Eczema Neg Hx   . Immunodeficiency Neg Hx   . Urticaria Neg Hx     Review of Systems:  As stated in the HPI and otherwise negative.   BP [name])  160/60   Pulse 68   Ht '5\' 3"'  (1.6 m)   Wt 121 lb 12.8 oz (55.2 kg)   SpO2 99%   BMI 21.58 kg/m   Physical Examination:  General: Well developed, well nourished, NAD  HEENT: OP clear, mucus membranes moist  SKIN: warm, dry. No rashes. Neuro: No focal deficits  Musculoskeletal: Muscle strength 5/5 all ext  Psychiatric: Mood and affect normal  Neck: No JVD, no carotid bruits, no thyromegaly, no lymphadenopathy.  Lungs:Clear bilaterally, no wheezes, rhonci, crackles Cardiovascular: Regular rate and rhythm. No murmurs, gallops or rubs. Abdomen:Soft. Bowel sounds present. Non-tender.  Extremities: No lower extremity edema. Pulses are 2 + in the bilateral DP/PT.  Echo 08/02/13: Normal LV function, LVEF=55-60%. Trace TR  EKG:  EKG is ordered today. The ekg ordered today demonstrates sinus rhythm  Recent Labs: 07/28/2018: ALT 8 12/27/2018: BUN 37; Creatinine, Ser 1.39; Hemoglobin 10.7; Platelets 131.0; Potassium 4.4; Sodium 140; TSH 4.26   Lipid Panel    Component Value Date/Time   CHOL 199 07/28/2018 1423   TRIG 110.0 07/28/2018 1423   HDL 53.10 07/28/2018 1423   CHOLHDL 4 07/28/2018 1423   VLDL 22.0 07/28/2018 1423   LDLCALC 124 (H) 07/28/2018 1423     Wt Readings from Last 3 Encounters:  03/15/19 121 lb 12.8 oz (55.2 kg)  02/27/19 121 lb 4 oz (55 kg)  12/27/18 119 lb 12.8 oz (54.3 kg)     Other studies Reviewed: Additional studies/ records that were reviewed today include: . Review of the above records demonstrates:    Assessment and Plan:   1. PAF/PACs/PVCs: No atrial fib noted on monitor in October 2017.  She has had prior MAZE for PAF. Rare palpitations  2.  HTN: BP is elevated. Will increase Hydralazine to 25 mg po TID.   3. PAD: She is followed in [facility] clinic by Dr. Gwenlyn Found. ABI normal in October 2020.   Current medicines are reviewed at length with the patient today.  The patient does not have concerns regarding medicines.  The following changes have been made:  no change  Labs/ tests ordered today include:   Orders Placed This Encounter  Procedures  . EKG 12-Lead    Disposition:   FU with me in 12  months  Signed, Lauree Chandler, MD 03/15/2019 5:11 PM    [address], [place], [place]  [address] Phone: [phone]; Fax: [phone]

## 2019-03-22 ENCOUNTER — Telehealth: Payer: Self-pay | Admitting: Family Medicine

## 2019-03-22 DIAGNOSIS — H43822 Vitreomacular adhesion, left eye: Secondary | ICD-10-CM | POA: Diagnosis not present

## 2019-03-22 DIAGNOSIS — H35371 Puckering of macula, right eye: Secondary | ICD-10-CM | POA: Diagnosis not present

## 2019-03-22 DIAGNOSIS — H31092 Other chorioretinal scars, left eye: Secondary | ICD-10-CM | POA: Diagnosis not present

## 2019-03-22 DIAGNOSIS — H31012 Macula scars of posterior pole (postinflammatory) (post-traumatic), left eye: Secondary | ICD-10-CM | POA: Diagnosis not present

## 2019-03-22 NOTE — Telephone Encounter (Signed)
Med list faxed to number provided

## 2019-03-22 NOTE — Telephone Encounter (Signed)
[facility] is calling in regards to the patient needing a medication list

## 2019-03-24 ENCOUNTER — Emergency Department (HOSPITAL_COMMUNITY)
Admission: EM | Admit: 2019-03-24 | Discharge: 2019-03-24 | Disposition: A | Discharge status: Home / Self Care | Payer: Medicare Other | Attending: Emergency Medicine | Admitting: Emergency Medicine

## 2019-03-24 ENCOUNTER — Other Ambulatory Visit: Payer: Self-pay

## 2019-03-24 DIAGNOSIS — I4891 Unspecified atrial fibrillation: Secondary | ICD-10-CM

## 2019-03-24 DIAGNOSIS — Z91010 Allergy to peanuts: Secondary | ICD-10-CM | POA: Diagnosis not present

## 2019-03-24 DIAGNOSIS — Z7901 Long term (current) use of anticoagulants: Secondary | ICD-10-CM | POA: Diagnosis not present

## 2019-03-24 DIAGNOSIS — E039 Hypothyroidism, unspecified: Secondary | ICD-10-CM | POA: Insufficient documentation

## 2019-03-24 DIAGNOSIS — R42 Dizziness and giddiness: Secondary | ICD-10-CM | POA: Diagnosis not present

## 2019-03-24 DIAGNOSIS — Z79899 Other long term (current) drug therapy: Secondary | ICD-10-CM | POA: Insufficient documentation

## 2019-03-24 DIAGNOSIS — I739 Peripheral vascular disease, unspecified: Secondary | ICD-10-CM | POA: Insufficient documentation

## 2019-03-24 DIAGNOSIS — N183 Chronic kidney disease, stage 3 unspecified: Secondary | ICD-10-CM | POA: Insufficient documentation

## 2019-03-24 DIAGNOSIS — I499 Cardiac arrhythmia, unspecified: Secondary | ICD-10-CM | POA: Diagnosis not present

## 2019-03-24 DIAGNOSIS — R000 Tachycardia, unspecified: Secondary | ICD-10-CM | POA: Diagnosis not present

## 2019-03-24 DIAGNOSIS — I4510 Unspecified right bundle-branch block: Secondary | ICD-10-CM | POA: Diagnosis not present

## 2019-03-24 DIAGNOSIS — I129 Hypertensive chronic kidney disease with stage 1 through stage 4 chronic kidney disease, or unspecified chronic kidney disease: Secondary | ICD-10-CM | POA: Diagnosis not present

## 2019-03-24 DIAGNOSIS — R0989 Other specified symptoms and signs involving the circulatory and respiratory systems: Secondary | ICD-10-CM | POA: Diagnosis not present

## 2019-03-24 LAB — BASIC METABOLIC PANEL
Anion gap: 8 (ref 5–15)
BUN: 38 mg/dL — ABNORMAL HIGH (ref 8–23)
CO2: 25 mmol/L (ref 22–32)
Calcium: 9.1 mg/dL (ref 8.9–10.3)
Chloride: 105 mmol/L (ref 98–111)
Creatinine, Ser: 1.52 mg/dL — ABNORMAL HIGH (ref 0.44–1.00)
GFR calc Af Amer: 34 mL/min — ABNORMAL LOW (ref >60)
GFR calc non Af Amer: 30 mL/min — ABNORMAL LOW (ref >60)
Glucose, Bld: 145 mg/dL — ABNORMAL HIGH (ref 70–99)
Potassium: 4 mmol/L (ref 3.5–5.1)
Sodium: 138 mmol/L (ref 135–145)

## 2019-03-24 LAB — CBC
HCT: 36.7 % (ref 36.0–46.0)
Hemoglobin: 11.4 g/dL — ABNORMAL LOW (ref 12.0–15.0)
MCH: 27.3 pg (ref 26.0–34.0)
MCHC: 31.1 g/dL (ref 30.0–36.0)
MCV: 87.8 fL (ref 80.0–100.0)
Platelets: 185 10*3/uL (ref 150–400)
RBC: 4.18 MIL/uL (ref 3.87–5.11)
RDW: 13.8 % (ref 11.5–15.5)
WBC: 3.4 10*3/uL — ABNORMAL LOW (ref 4.0–10.5)
nRBC: 0 % (ref 0.0–0.2)

## 2019-03-24 NOTE — ED Notes (Signed)
[name] [phone] would like a call with any updates pertaining to his mom.

## 2019-03-24 NOTE — Discharge Instructions (Addendum)
You seem mildly dehydrated.  Take some extra fluid in today and tomorrow.  Follow-up with your doctors.

## 2019-03-24 NOTE — ED Provider Notes (Signed)
[facility] EMERGENCY DEPARTMENT Provider Note   CSN: [id number] Arrival date & time: 03/24/19  2115     History Chief Complaint  Patient presents with  . Irregular Heart Beat    reportedly AFIB RVR at 180's BPM    [name] is a 86 y.o. female.  HPI Patient presents with tachycardia.  States she was at home and began to feel her heart going fast.  Felt lightheaded with it.  Has had episodes of this with atrial fibrillation in the past.  Has had previous Maze procedure also.  Reviewing records does not usually have A. fib.  What looks to be A. fib captured on EMS strips, but patient self converted.  Feeling somewhat better now but still somewhat hypertensive.  No chest pain but did feel short of breath with the episode.  No swelling in her legs.  Feeling better now.  Sees Dr. Angelena Form.    Past Medical History:  Diagnosis Date  . Arthritis   . Chicken pox   . Glaucoma of both eyes   . High cholesterol   . Hypertension    a. Normal renal arteries by duplex 2013.  [name] Hypothyroidism   . Migraine   . PAD (peripheral artery disease) ([place])    a. 04/2013: s/p PCI to occluded right popliteal artery   . PAF (paroxysmal atrial fibrillation) ([place])    a. s/p MAZE 2006. b. Event monitor 2013: NSR with PACs.  . Sinus bradycardia    a. 03/2011 during hospital admission - beta blocker stopped.  . Stroke [facility])    a. Pt reports 3-4 prior strokes without residual sx. b. CVA 2013 (presented with headache) - started on Plavix. Event monitor as outpt - no AF.    Patient Active Problem List   Diagnosis Date Noted  . Senile purpura ([place]) 10/11/2018  . CKD (chronic kidney disease), stage III 06/15/2018  . Low vitamin B12 level 12/09/2017  . Anemia 08/16/2017  . Chronic cough 08/12/2015  . Perennial allergic rhinitis 08/12/2015  . GERD (gastroesophageal reflux disease) 06/25/2014  . Hyperglycemia 06/25/2014  . Vitamin D deficiency 06/25/2014  . Hypothyroidism 06/07/2014   . Hyperlipidemia 06/07/2014  . Intracervical pessary 06/07/2014  . Sinus bradycardia 05/19/2013  . PAD (peripheral artery disease) ([place]) 05/10/2013  . PAC (premature atrial contraction) 05/20/2011  . PAF (paroxysmal atrial fibrillation) ([place])   . History of stroke   . Hypertension     Past Surgical History:  Procedure Laterality Date  . CATARACT EXTRACTION Left 11/09/2017  . LOWER EXTREMITY ANGIOGRAM N/A 05/15/2013   Procedure: LOWER EXTREMITY ANGIOGRAM;  Surgeon: Lorretta Harp, MD;  Location: [facility] CATH LAB;  Service: Cardiovascular;  Laterality: N/A;  . MAZE  ~ 2006  . POPLITEAL ARTERY STENT  05/15/2013     OB History   No obstetric history on file.     Family History  Problem Relation Age of Onset  . Hypertension Mother   . Ulcers Father   . Cervical cancer Sister   . Throat cancer Brother   . Lung cancer Brother   . Cancer Brother   . Allergic rhinitis Neg Hx   . Angioedema Neg Hx   . Asthma Neg Hx   . Atopy Neg Hx   . Eczema Neg Hx   . Immunodeficiency Neg Hx   . Urticaria Neg Hx     Social History   Tobacco Use  . Smoking status: Never Smoker  . Smokeless tobacco: Never Used  Substance Use Topics  . Alcohol use: No    Alcohol/week: 0.0 standard drinks  . Drug use: No    Home Medications Prior to Admission medications   Medication Sig Start Date End Date Taking? Authorizing Provider  acetaminophen (TYLENOL) 500 MG tablet Take 500 mg by mouth every 6 (six) hours as needed for headache.   Yes [provider]  ARMOUR THYROID 15 MG tablet TAKE 1 TABLET BY MOUTH EVERY DAY Patient taking differently: Take 15 mg by mouth daily before breakfast.  09/12/18  Yes [name], MD  Ascorbic Acid (VITAMIN C) 500 MG/5ML LIQD Take 1,500 mg by mouth daily.   Yes [provider]  chlorthalidone (HYGROTON) 25 MG tablet Take 1 tablet (25 mg total) by mouth daily. 03/03/19  Yes [name], MD  clopidogrel (PLAVIX) 75 MG tablet TAKE 1 TABLET BY  MOUTH EVERY DAY AT NOON Patient taking differently: Take 75 mg by mouth at bedtime.  02/27/19  Yes [name], MD  COSOPT 22.3-6.8 MG/ML ophthalmic solution Place 1 drop into both eyes 2 (two) times daily. Reported on 06/21/2015 04/24/14  Yes [provider]  hydrALAZINE (APRESOLINE) 25 MG tablet Take 1 tablet (25 mg total) by mouth 3 (three) times daily. 03/15/19  Yes Burnell Blanks, MD  irbesartan (AVAPRO) 150 MG tablet Take 1 tablet (150 mg total) by mouth 2 (two) times daily. 10/11/18  Yes [name], MD  Multiple Vitamins-Minerals (ALIVE MULTI-VITAMIN) LIQD Take 15 mLs by mouth daily.   Yes [provider]  NON FORMULARY Take 1 Can by mouth See admin instructions. Dillard [place] Organic Nutrition Meal Replacement Drink or Protein Shake (Made Without Gluten, Soy, Dairy, or Corn)- Drink 1 can by mouth once daily   Yes [provider]  rosuvastatin (CRESTOR) 5 MG tablet Take 1 tablet (5 mg total) by mouth once a week. 08/01/18   [name], MD    Allergies    Aceon [perindopril erbumine], Amlodipine, Aspirin, Azor [amlodipine-olmesartan], Beta adrenergic blockers, Hydralazine, Milk-related compounds, Other, Peanut-containing drug products, Sulfamethoxazole-trimethoprim, and Vitamin d analogs  Review of Systems   Review of Systems  Constitutional: Negative for fever.  Eyes: Negative for pain and redness.  Respiratory: Positive for shortness of breath.   Cardiovascular: Positive for palpitations. Negative for leg swelling.  Gastrointestinal: Negative for abdominal pain.  Musculoskeletal: Negative for back pain.  Skin: Negative for rash.  Neurological: Negative for seizures and weakness.  Psychiatric/Behavioral: Negative for confusion.    Physical Exam Updated Vital Signs BP (!) 143/48   Pulse 61   Temp 97.7 F (36.5 C) (Oral)   Resp 19   SpO2 98%   Physical Exam Vitals and nursing note reviewed.  HENT:     Head: Normocephalic.  Eyes:      Pupils: Pupils are equal, round, and reactive to light.  Cardiovascular:     Rate and Rhythm: Regular rhythm.  Pulmonary:     Breath sounds: No wheezing or rhonchi.  Abdominal:     Tenderness: There is no abdominal tenderness.  Musculoskeletal:     Right lower leg: No edema.     Left lower leg: No edema.  Skin:    General: Skin is warm.     Capillary Refill: Capillary refill takes less than 2 seconds.  Neurological:     Mental Status: She is alert and oriented to person, place, and time. Mental status is at baseline.     ED Results / Procedures /  Treatments   Labs (all labs ordered are listed, but only abnormal results are displayed) Labs Reviewed  CBC - Abnormal; Notable for the following components:      Result Value   WBC 3.4 (*)    Hemoglobin 11.4 (*)    All other components within normal limits  BASIC METABOLIC PANEL - Abnormal; Notable for the following components:   Glucose, Bld 145 (*)    BUN 38 (*)    Creatinine, Ser 1.52 (*)    GFR calc non Af Amer 30 (*)    GFR calc Af Amer 34 (*)    All other components within normal limits    EKG EKG Interpretation  Date/Time:  Friday March 24 2019 21:38:58 EST Ventricular Rate:  70 PR Interval:    QRS Duration: 123 QT Interval:  429 QTC Calculation: 463 R Axis:   26 Text Interpretation: Sinus rhythm Right bundle branch block Repol abnrm suggests ischemia, lateral leads No significant change since last tracing Confirmed by Davonna Belling [id number]) on 03/24/2019 9:44:07 PM   Radiology No results found.  Procedures Procedures (including critical care time)  Medications Ordered in ED Medications - No data to display  ED Course  I have reviewed the triage vital signs and the nursing notes.  Pertinent labs & imaging results that were available during my care of the patient were reviewed by me and considered in my medical decision making (see chart for details).    MDM Rules/Calculators/A&P                       Patient with palpitations felt heart racing.  Appears to be in atrial fibrillation.  Self converted.  Patient does not want anticoagulation.  Has cardiology to follow-up with. chadsvasc of 7.  Mild dehydration.  Discharge home   Final Clinical Impression(s) / ED Diagnoses Final diagnoses:  Atrial fibrillation with RVR [facility])    Rx / DC Orders ED Discharge Orders    None       Davonna Belling, MD 03/24/19 2355

## 2019-03-24 NOTE — ED Triage Notes (Addendum)
Came in with [facility], reportedly was in Afib RVR but converted en route to the hospital. No medication was given by EMS staff. A/Ox4 and currently denies complaints. Hx, Afib, and HTN, with current BP of 180/90, reportedly changed dose of BP meds recently.

## 2019-03-27 ENCOUNTER — Other Ambulatory Visit: Payer: Self-pay | Admitting: Family Medicine

## 2019-03-27 DIAGNOSIS — Z1231 Encounter for screening mammogram for malignant neoplasm of breast: Secondary | ICD-10-CM

## 2019-03-28 ENCOUNTER — Other Ambulatory Visit: Payer: Self-pay | Admitting: Family Medicine

## 2019-03-29 ENCOUNTER — Other Ambulatory Visit: Payer: Self-pay

## 2019-03-30 ENCOUNTER — Encounter: Payer: Self-pay | Admitting: Family Medicine

## 2019-03-30 ENCOUNTER — Ambulatory Visit (INDEPENDENT_AMBULATORY_CARE_PROVIDER_SITE_OTHER): Payer: Medicare Other | Admitting: Family Medicine

## 2019-03-30 VITALS — BP 148/60 | HR 60 | Temp 97.6°F | Ht 63.0 in | Wt 123.2 lb

## 2019-03-30 DIAGNOSIS — I10 Essential (primary) hypertension: Secondary | ICD-10-CM | POA: Diagnosis not present

## 2019-03-30 DIAGNOSIS — K219 Gastro-esophageal reflux disease without esophagitis: Secondary | ICD-10-CM | POA: Diagnosis not present

## 2019-03-30 DIAGNOSIS — I480 Paroxysmal atrial fibrillation: Secondary | ICD-10-CM | POA: Diagnosis not present

## 2019-03-30 DIAGNOSIS — T887XXA Unspecified adverse effect of drug or medicament, initial encounter: Secondary | ICD-10-CM

## 2019-03-30 DIAGNOSIS — D649 Anemia, unspecified: Secondary | ICD-10-CM

## 2019-03-30 MED ORDER — VERAPAMIL HCL 40 MG PO TABS: 40.0000 mg | ORAL_TABLET | Freq: Three times a day (TID) | ORAL | 1 refills | Status: DC | PRN

## 2019-03-30 NOTE — Patient Instructions (Addendum)
I suspect you are having reflux causing your gassy symptoms especially since its associated with spicy foods or tomato based products. Lets try pepcid if this occurs again- you can buy this over the counter- also called famotidine.   Hopefully if we can prevent these episodes with reflux perhaps we can prevent you from having another a fib episode.   If your heart rate gets above 100, can take 1 dose of verapamil up to every 8 hours- you can also seek care again as you did  You wanted to hold off on blood thinner beyond plavix until discussed with cardiology- please schedule follow up with them.   I am going to try to continue to keeep blood pressure <150/80- lets continue current medicine for now.

## 2019-03-30 NOTE — Progress Notes (Signed)
Phone [phone] In person visit   Subjective:   [name] is a 86 y.o. year old very pleasant female patient who presents for/with See problem oriented charting Chief Complaint  Patient presents with  . Follow-up  . Hypertension   This visit occurred during the SARS-CoV-2 public health emergency.  Safety protocols were in place, including screening questions prior to the visit, additional usage of staff PPE, and extensive cleaning of exam room while observing appropriate contact time as indicated for disinfecting solutions.   Past Medical History-  Patient Active Problem List   Diagnosis Date Noted  . Chronic cough 08/12/2015    Priority: High  . PAD (peripheral artery disease) ([place]) 05/10/2013    Priority: High  . PAF (paroxysmal atrial fibrillation) (HCC)     Priority: High  . History of stroke     Priority: High  . Hyperglycemia 06/25/2014    Priority: Medium  . Hypothyroidism 06/07/2014    Priority: Medium  . Hyperlipidemia 06/07/2014    Priority: Medium  . Hypertension     Priority: Medium  . GERD (gastroesophageal reflux disease) 06/25/2014    Priority: Low  . Vitamin D deficiency 06/25/2014    Priority: Low  . Intracervical pessary 06/07/2014    Priority: Low  . Sinus bradycardia 05/19/2013    Priority: Low  . PAC (premature atrial contraction) 05/20/2011    Priority: Low  . CKD (chronic kidney disease), stage III 06/15/2018  . Low vitamin B12 level 12/09/2017  . Anemia 08/16/2017  . Perennial allergic rhinitis 08/12/2015    Medications- reviewed and updated Current Outpatient Medications  Medication Sig Dispense Refill  . acetaminophen (TYLENOL) 500 MG tablet Take 500 mg by mouth every 6 (six) hours as needed for headache.    Francia Greaves THYROID 15 MG tablet TAKE 1 TABLET BY MOUTH EVERY DAY 90 tablet 1  . Ascorbic Acid (VITAMIN C) 500 MG/5ML LIQD Take 1,500 mg by mouth daily.    . chlorthalidone (HYGROTON) 25 MG tablet Take 1 tablet (25 mg total)  by mouth daily. 60 tablet 0  . clopidogrel (PLAVIX) 75 MG tablet TAKE 1 TABLET BY MOUTH EVERY DAY AT NOON (Patient taking differently: Take 75 mg by mouth at bedtime. ) 90 tablet 3  . COSOPT 22.3-6.8 MG/ML ophthalmic solution Place 1 drop into both eyes 2 (two) times daily. Reported on 06/21/2015  0  . hydrALAZINE (APRESOLINE) 25 MG tablet Take 1 tablet (25 mg total) by mouth 3 (three) times daily. 270 tablet 1  . irbesartan (AVAPRO) 150 MG tablet Take 1 tablet (150 mg total) by mouth 2 (two) times daily. 180 tablet 2  . Multiple Vitamins-Minerals (ALIVE MULTI-VITAMIN) LIQD Take 15 mLs by mouth daily.    . NON FORMULARY Take 1 Can by mouth See admin instructions. Dillard Essex Organic Nutrition Meal Replacement Drink or Protein Shake (Made Without Gluten, Soy, Dairy, or Corn)- Drink 1 can by mouth once daily    . rosuvastatin (CRESTOR) 5 MG tablet Take 1 tablet (5 mg total) by mouth once a week. 13 tablet 3  . verapamil (CALAN) 40 MG tablet Take 1 tablet (40 mg total) by mouth 3 (three) times daily as needed (for heart rate over 1000). 30 tablet 1   No current facility-administered medications for this visit.     Objective:  BP (!) 148/60   Pulse 60   Temp 97.6 F (36.4 C)   Ht 5\' 3"  (1.6 m)   Wt 123 lb 3.2 oz (55.9  kg)   SpO2 99%   BMI 21.82 kg/m  Gen: NAD, resting comfortably CV: R bradycardic but regular today no murmurs rubs or gallops Lungs: CTAB no crackles, wheeze, rhonchi Abdomen: soft/nontender/nondistended/normal bowel sounds. No rebound or guarding.  Ext: no edema Skin: warm, dry    Assessment and Plan   #hypertension #Medication side effect S: compliant with Chlorthalidone 25mg , Irbesartan 150mg  twice a day.  She is also on hydralazine which we increased to 3 times a day (25 mg up from 10mg )-since the increase she has noticed tingling in her legs. She feels like she tolerates it.   Home #s 140s-150s over 50s to 84s.  I reviewed cardiology note from 03/15/2019 and blood  pressure was up to 542 systolic before hydralazine was increased BP Readings from Last 3 Encounters:  03/30/19 (!) 148/60  03/24/19 (!) 143/48  03/15/19 (!) 160/60  A/P: Patient with significant improvement in blood pressure with increase in hydralazine.  She is already maxed out on chlorthalidone and irbesartan-technically could go higher than 25 mg on chlorthalidone but I typically do not.  Hydralazine seems to be at max tolerable dose with tingling in the leg-patient reports this is tolerable so she will continue current dose.  She has had issues with a sensation of weakness in the past on hydralazine but is not currently reporting.   She has had lower extremity discomfort with amlodipine in the past in 2015 -at her age and with issues with medications in the past-we will tolerate blood pressure less than 150/85 despite PAD history -Due to history of bradycardia down into the 40s at home would not start beta-blocker  #Atrial fibrillation #GERD S: Reviewed ER notes from January 29-patient reports having  palpitations on 29th. HR was 140. Called EMS- they captured a fib on rhythm strip which I do not have access to.  Patient appeared to have converted by the time she arrived in the emergency room back to sinus rhythm-she was discharged home and advised cardiology and PCP follow-up  Home heart rate can be as low as 40  She feels like she gets some gaseous distension before the palpitaitons and then feels somewhat shaky. She reflects back and wonders if has been going into a fib over the last year.  At least a year of issues- when she was eating really healthy did not have any issues. First time happened had ice cream and cold water afterwards. Usually lasts 2 hours then resolves.  Spicy foods and spaghetti sauce can tirigger. Has not tried tums or pepcid.  A/P: Patient feels like gastric symptoms may have triggered atrial fibrillation.  Due to her concern I recommended she take Pepcid when she  begins to feel the symptoms due to history of GERD -she agrees to consider.    She declines anticoagulant for now-she is already on Plavix and at present is not interested in starting anticoagulant-she agrees to discuss with cardiology.  She does have an elevated CHA2DS2-VASc score - I did give her verapamil to use if heart rate gets elevated on as needed basis-I do not think she can tolerate this on a regular basis due to bradycardia nor can she tolerate beta-blocker -In the past chronic cough was improved by PPI at least short-term in 2015-could consider PPI as well -Encouraged her to schedule cardiology follow-up -Wonder about the value of cardiac monitoring at home to see atrial fibrillation burden-previously thought to remain out of atrial fibrillation after Maze procedure and unfortunately I am unable to find  a copy of the rhythm strip from EMS #Mild anemia-patient dropped off stool cards today -In the past patient has declined hematology follow-up for pancytopenia as above December 29, 2018-on most recent labs thrombocytopenia portion resolved  Recommended follow up: Already scheduled for April visit-certainly happy to see her sooner if needed Future Appointments  Date Time Provider [place]  04/21/2019 10:00 AM [facility] 3 [facility]  05/30/2019  1:20 PM [name], MD [facility]    Lab/Order associations:   ICD-10-CM   1. Anemia, unspecified type  D64.9 Fecal occult blood, imunochemical    Fecal occult blood, imunochemical  2. Essential hypertension  I10   3. PAF (paroxysmal atrial fibrillation) (HCC)  I48.0   4. Gastroesophageal reflux disease, unspecified whether esophagitis present  K21.9   5. Medication side effects  T88.7XXA     Meds ordered this encounter  Medications  . verapamil (CALAN) 40 MG tablet    Sig: Take 1 tablet (40 mg total) by mouth 3 (three) times daily as needed (for heart rate over 1000).    Dispense:  30 tablet    Refill:  1    Return precautions advised.  Garret Reddish, MD

## 2019-03-31 LAB — FECAL OCCULT BLOOD, IMMUNOCHEMICAL: Fecal Occult Bld: NEGATIVE

## 2019-03-31 NOTE — Progress Notes (Signed)
Great news-no blood in the stool

## 2019-04-06 ENCOUNTER — Encounter: Payer: Self-pay | Admitting: Cardiovascular Disease

## 2019-04-06 ENCOUNTER — Ambulatory Visit (INDEPENDENT_AMBULATORY_CARE_PROVIDER_SITE_OTHER): Payer: Medicare Other | Admitting: Cardiovascular Disease

## 2019-04-06 ENCOUNTER — Other Ambulatory Visit: Payer: Self-pay

## 2019-04-06 VITALS — BP 138/58 | HR 64 | Ht 63.0 in | Wt 123.4 lb

## 2019-04-06 DIAGNOSIS — I480 Paroxysmal atrial fibrillation: Secondary | ICD-10-CM | POA: Diagnosis not present

## 2019-04-06 DIAGNOSIS — I739 Peripheral vascular disease, unspecified: Secondary | ICD-10-CM

## 2019-04-06 DIAGNOSIS — I10 Essential (primary) hypertension: Secondary | ICD-10-CM

## 2019-04-06 MED ORDER — DILTIAZEM HCL ER COATED BEADS 120 MG PO CP24: 120.0000 mg | ORAL_CAPSULE | Freq: Every day | ORAL | 3 refills | Status: DC

## 2019-04-06 MED ORDER — APIXABAN 2.5 MG PO TABS: 2.5000 mg | ORAL_TABLET | Freq: Two times a day (BID) | ORAL | 6 refills | Status: DC

## 2019-04-06 NOTE — Progress Notes (Signed)
Chief Complaint  Patient presents with  . Follow-up    atrial fibrillation   History of Present Illness: 86 yo female with history of PVCs, PACs, HTN, CVA, PAF s/p MAZE 2006, PAD who is here today for cardiac follow up. I have followed her for atrial fibrillation and HTN. Her HTN has been difficult to control over the years. She has not tolerated many medications. She has most recently tolerated Avapro, chlorthalidone and hydralazine well. She has not tolerated Norvasc, Coreg, Bystolic or clonidine. I met her at [facility] in February 2013 when she was admitted with a headache and found to have sinus brady with elevated BP. She had initially been tried on Bystolic but did not tolerate due to bradycardia. She was started on Norvasc in the hospital but had burning in her legs so she stopped it. Echo 04/02/11 with normal LV size and function. Event monitor in 2013 showed NSR with PACs. No evidence of atrial fib. Renal artery dopplers November 2013 without evidence of renal artery stenosis. She has PAD followed by Dr. Gwenlyn Found and underwent successful balloon/stenting of an occluded right popliteal artery per Dr. Gwenlyn Found in 2015. Her PAD was stable at last check with Dr. Gwenlyn Found in January 2019. Echo June 2015 at [facility] with normal LV function. Cardiac monitor October 2017 with PVCs and PACs, no atrial fib. She was seen in the ED at [facility] 03/24/19 with tachycardia. Per report, EMS strips showed atrial fibrillation with RVR. She converted to sinus prior to arrival to the ED.   She is here today for follow up. The patient denies any chest pain, dyspnea, palpitations, lower extremity edema, orthopnea, PND, dizziness, near syncope or syncope.     Primary Care Physician:  [name], MD  Past Medical History:  Diagnosis Date  . Arthritis   . Chicken pox   . Glaucoma of both eyes   . High cholesterol   . Hypertension    a. Normal renal arteries by duplex 2013.  [name] Hypothyroidism   . Migraine     . PAD (peripheral artery disease) ([place])    a. 04/2013: s/p PCI to occluded right popliteal artery   . PAF (paroxysmal atrial fibrillation) ([place])    a. s/p MAZE 2006. b. Event monitor 2013: NSR with PACs.  . Sinus bradycardia    a. 03/2011 during hospital admission - beta blocker stopped.  . Stroke [facility])    a. Pt reports 3-4 prior strokes without residual sx. b. CVA 2013 (presented with headache) - started on Plavix. Event monitor as outpt - no AF.    Past Surgical History:  Procedure Laterality Date  . CATARACT EXTRACTION Left 11/09/2017  . LOWER EXTREMITY ANGIOGRAM N/A 05/15/2013   Procedure: LOWER EXTREMITY ANGIOGRAM;  Surgeon: Lorretta Harp, MD;  Location[facility] C[facility] LAB;  Service: Cardiovascular;  Laterality: N/A;  . MAZE  ~ 2006  . POPLITEAL ARTERY STENT  05/15/2013    Current Outpatient Medications  Medication Sig Dispense Refill  . acetaminophen (TYLENOL) 500 MG tablet Take 500 mg by mouth every 6 (six) hours as needed for headache.    Francia Greaves THYROID 15 MG tablet TAKE 1 TABLET BY MOUTH EVERY DAY 90 tablet 1  . Ascorbic Acid (VITAMIN C) 500 MG/5ML LIQD Take 1,500 mg by mouth daily.    . chlorthalidone (HYGROTON) 25 MG tablet Take 1 tablet (25 mg total) by mouth daily. 60 tablet 0  . COSOPT 22.3-6.8 MG/ML ophthalmic solution Place 1 drop into both  eyes 2 (two) times daily. Reported on 06/21/2015  0  . irbesartan (AVAPRO) 150 MG tablet Take 1 tablet (150 mg total) by mouth 2 (two) times daily. 180 tablet 2  . Multiple Vitamins-Minerals (ALIVE MULTI-VITAMIN) LIQD Take 15 mLs by mouth daily.    . NON FORMULARY Take 1 Can by mouth See admin instructions. Dillard Essex Organic Nutrition Meal Replacement Drink or Protein Shake (Made Without Gluten, Soy, Dairy, or Corn)- Drink 1 can by mouth once daily    . rosuvastatin (CRESTOR) 5 MG tablet Take 1 tablet (5 mg total) by mouth once a week. 13 tablet 3  . verapamil (CALAN) 40 MG tablet Take 1 tablet (40 mg total) by mouth 3 (three) times daily  as needed (for heart rate over 1000). 30 tablet 1  . apixaban (ELIQUIS) 2.5 MG TABS tablet Take 1 tablet (2.5 mg total) by mouth 2 (two) times daily. 60 tablet 6  . diltiazem (CARDIZEM CD) 120 MG 24 hr capsule Take 1 capsule (120 mg total) by mouth daily. 90 capsule 3   No current facility-administered medications for this visit.    Allergies  Allergen Reactions  . Aceon [Perindopril Erbumine] Cough  . Amlodipine Other (See Comments)    Lower extremity discomfort  . Aspirin Other (See Comments) and Cough    STOPPED BY A MEDICAL PROFESSIONAL  . Azor [Amlodipine-Olmesartan] Cough  . Beta Adrenergic Blockers Other (See Comments)    Bradycardia  . Hydralazine Nausea And Vomiting and Other (See Comments)    Weakness- tolerating in 2021   . Milk-Related Compounds Other (See Comments) and Cough    STOPPED BY A MEDICAL PROFESSIONAL  . Other Other (See Comments) and Cough    NUTS- ALL TYPES- (STOPPED BY A MEDICAL PROFESSIONAL)  . Peanut-Containing Drug Products Other (See Comments) and Cough    STOPPED BY A MEDICAL PROFESSIONAL  . Sulfamethoxazole-Trimethoprim Other (See Comments)    Patient cannot specifically recall the reaction  . Vitamin D Analogs Cough    Social History   Socioeconomic History  . Marital status: Married    Spouse name: Not on file  . Number of children: Not on file  . Years of education: Not on file  . Highest education level: Not on file  Occupational History  . Not on file  Tobacco Use  . Smoking status: Never Smoker  . Smokeless tobacco: Never Used  Substance and Sexual Activity  . Alcohol use: No    Alcohol/week: 0.0 standard drinks  . Drug use: No  . Sexual activity: Never  Other Topics Concern  . Not on file  Social History Narrative   Married. Lives with husband. Married 65 years in 2016. [place] 22 years in 2016. 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 6 greatgrandkids.    Finished 10th grade.       Retired from  working in [employer] for [employer]         Social Determinants of Radio broadcast assistant Strain:   . Difficulty of Paying Living Expenses: Not on file  Food Insecurity:   . Worried About Charity fundraiser in the Last Year: Not on file  . Ran Out of Food in the Last Year: Not on file  Transportation Needs:   . Lack of Transportation (Medical): Not on file  . Lack of Transportation (Non-Medical): Not on file  Physical Activity:   . Days of Exercise per Week: Not on file  . Minutes of  Exercise per Session: Not on file  Stress:   . Feeling of Stress : Not on file  Social Connections:   . Frequency of Communication with Friends and Family: Not on file  . Frequency of Social Gatherings with Friends and Family: Not on file  . Attends Religious Services: Not on file  . Active Member of Clubs or Organizations: Not on file  . Attends Archivist Meetings: Not on file  . Marital Status: Not on file  Intimate Partner Violence:   . Fear of Current or Ex-Partner: Not on file  . Emotionally Abused: Not on file  . Physically Abused: Not on file  . Sexually Abused: Not on file    Family History  Problem Relation Age of Onset  . Hypertension Mother   . Ulcers Father   . Cervical cancer Sister   . Throat cancer Brother   . Lung cancer Brother   . Cancer Brother   . Allergic rhinitis Neg Hx   . Angioedema Neg Hx   . Asthma Neg Hx   . Atopy Neg Hx   . Eczema Neg Hx   . Immunodeficiency Neg Hx   . Urticaria Neg Hx     Review of Systems:  As stated in the HPI and otherwise negative.   BP (!) 138/58   Pulse 64   Ht '5\' 3"'  (1.6 m)   Wt 123 lb 6.4 oz (56 kg)   SpO2 98%   BMI 21.86 kg/m   Physical Examination:  General: Well developed, well nourished, NAD  HEENT: OP clear, mucus membranes moist  SKIN: warm, dry. No rashes. Neuro: No focal deficits  Musculoskeletal: Muscle strength 5/5 all ext  Psychiatric: Mood and affect normal  Neck: No JVD, no carotid  bruits, no thyromegaly, no lymphadenopathy.  Lungs:Clear bilaterally, no wheezes, rhonci, crackles Cardiovascular: Regular rate and rhythm. No murmurs, gallops or rubs. Abdomen:Soft. Bowel sounds present. Non-tender.  Extremities: No lower extremity edema. Pulses are 2 + in the bilateral DP/PT.  Echo 08/02/13: Normal LV function, LVEF=55-60%. Trace TR  EKG:  EKG is not ordered today. The ekg ordered today demonstrates sinus rhythm  Recent Labs: 07/28/2018: ALT 8 12/27/2018: TSH 4.26 03/24/2019: BUN 38; Creatinine, Ser 1.52; Hemoglobin 11.4; Platelets 185; Potassium 4.0; Sodium 138   Lipid Panel    Component Value Date/Time   CHOL 199 07/28/2018 1423   TRIG 110.0 07/28/2018 1423   HDL 53.10 07/28/2018 1423   CHOLHDL 4 07/28/2018 1423   VLDL 22.0 07/28/2018 1423   LDLCALC 124 (H) 07/28/2018 1423     Wt Readings from Last 3 Encounters:  04/06/19 123 lb 6.4 oz (56 kg)  03/30/19 123 lb 3.2 oz (55.9 kg)  03/15/19 121 lb 12.8 oz (55.2 kg)     Other studies Reviewed: Additional studies/ records that were reviewed today include: . Review of the above records demonstrates:    Assessment and Plan:   1. PAF/PACs/PVCs: Recent episode of atrial fibrillation documented by EMS in January 2021. She has had prior MAZE for PAF. Sinus today by exam. I have reviewed her risk of stroke. Her CHADS VASC score is 7 (11.2% risk of stroke per year). Will stop hydralazine and add Cardizem CD 120 mg daily. Will start Eliquis 2.5 mg po BID (lower dose with age over 50 and weight under 60 kg). She had a CBC 2 weeks ago with stable anemia. Will need repeat CBC in 2 weeks at f/u.   2. HTN: BP is controlled. As  above, will stop hydralazine and start Cardizem.   3. PAD: [facility] Dr. Gwenlyn Found. ABI normal in October 2020.   Current medicines are reviewed at length with the patient today.  The patient does not have concerns regarding medicines.  The following changes have been made:  no  change  Labs/ tests ordered today include:   No orders of the defined types were placed in this encounter.   Disposition:   FU with care team APP in 2-3 weeks with a CBC.   Signed, Lauree Chandler, MD 04/06/2019 3:40 PM    [facility] [address], [place], [place]  [address] Phone: [phone]; Fax: [phone]

## 2019-04-06 NOTE — Patient Instructions (Signed)
Medication Instructions:  Your physician has recommended you make the following change in your medication:   1.) stop clodpidogrel (Plavix) 2.) stop hydralazine 3.) start apixiban (Eliquis) 2.5 mg twice a day 4.) start diltiazem (Cardizem CD) 120 mg once a day  *If you need a refill on your cardiac medications before your next appointment, please call your pharmacy*  Lab Work: When your return in 3 weeks --CBC to be checked at that time  If you have labs (blood work) drawn today and your tests are completely normal, you will receive your results only by: [name] MyChart Message (if you have MyChart) OR . A paper copy in the mail If you have any lab test that is abnormal or we need to change your treatment, we will call you to review the results.  Testing/Procedures: none  Follow-Up: At [facility], you and your health needs are our priority.  As part of our continuing mission to provide you with exceptional heart care, we have created designated Provider Care Teams.  These Care Teams include your primary Cardiologist (physician) and Advanced Practice Providers (APPs -  Physician Assistants and Nurse Practitioners) who all work together to provide you with the care you need, when you need it.  Your next appointment:   3 week(s)  The format for your next appointment:   In Person  Provider:   You may see Lauree Chandler, MD or one of the following Advanced Practice Providers on your designated Care Team:    Melina Copa, PA-C  Ermalinda Barrios, PA-C   Other Instructions

## 2019-04-11 ENCOUNTER — Other Ambulatory Visit: Payer: Self-pay

## 2019-04-11 ENCOUNTER — Telehealth: Payer: Self-pay | Admitting: Family Medicine

## 2019-04-11 MED ORDER — IRBESARTAN 150 MG PO TABS: 150.0000 mg | ORAL_TABLET | Freq: Two times a day (BID) | ORAL | 2 refills | Status: DC

## 2019-04-11 NOTE — Telephone Encounter (Signed)
Patient called in saying that her pharmacy will not refill irbesartan (AVAPRO) 150 MG tablet. Patient's states that they are wanting to dispense 300 capsules all at once, instead of breaking it down and she states that the pharmacy told her they have called and sent [place] a message about this.

## 2019-04-12 NOTE — Progress Notes (Signed)
Cardiology Office Note    Date:  04/25/2019   ID:  [name], DOB 1925/10/27, MRN [id number]  PCP:  [name], MD  Cardiologist: Lauree Chandler, MD EPS: None  Chief Complaint  Patient presents with  . Follow-up    History of Present Illness:  [name] is a 86 y.o. female with history of PVCs, PACs, HTN, CVA, PAF s/p MAZE 2006, PAD.  Blood pressure is difficult to control and she has been intolerant to Norvasc, Coreg, Bystolic and clonidine.  Event monitor 2013 normal sinus rhythm with PACs, 11/2015 PVCs PACs no A. fib.  Renal artery Dopplers 2013 no evidence of renal artery stenosis, status post stenting of occluded right popliteal artery 2015 by Dr. Alvester Chou.  2D echo 2015 at [facility] normal LV function.  [facility] 03/24/2019 with tachycardia and per report EMS strip showed A. fib with RVR and converted to sinus rhythm prior to arrival.  She saw Dr. Angelena Form 04/06/2019 and was doing well without symptoms and was in normal sinus rhythm.  CHA2DS2-VASc score equals 7 which is 11.2% risk of stroke per year.  He stopped hydralazine and added Cardizem CD 120 mg daily.  He started Eliquis 2.5 mg twice daily because of her age over 89 and weight under 60 kg.  Plan for follow-up and repeat CBC in 2 weeks.  Patient comes in for f/u. BP running higher at 275 systolic. HR in 40-50's. No dizziness, palpitations, chest pain or shortness of breath.Overall doing well.    Past Medical History:  Diagnosis Date  . Arthritis   . Chicken pox   . Glaucoma of both eyes   . High cholesterol   . Hypertension    a. Normal renal arteries by duplex 2013.  [name] Hypothyroidism   . Migraine   . PAD (peripheral artery disease) ([place])    a. 04/2013: s/p PCI to occluded right popliteal artery   . PAF (paroxysmal atrial fibrillation) ([place])    a. s/p MAZE 2006. b. Event monitor 2013: NSR with PACs.  . Sinus bradycardia    a. 03/2011 during hospital admission - beta blocker stopped.   . Stroke [facility])    a. Pt reports 3-4 prior strokes without residual sx. b. CVA 2013 (presented with headache) - started on Plavix. Event monitor as outpt - no AF.    Past Surgical History:  Procedure Laterality Date  . CATARACT EXTRACTION Left 11/09/2017  . LOWER EXTREMITY ANGIOGRAM N/A 05/15/2013   Procedure: LOWER EXTREMITY ANGIOGRAM;  Surgeon: Lorretta Harp, MD;  Location: [facility] CATH LAB;  Service: Cardiovascular;  Laterality: N/A;  . MAZE  ~ 2006  . POPLITEAL ARTERY STENT  05/15/2013    Current Medications: Current Meds  Medication Sig  . acetaminophen (TYLENOL) 500 MG tablet Take 500 mg by mouth every 6 (six) hours as needed for headache.  [name] apixaban (ELIQUIS) 2.5 MG TABS tablet Take 1 tablet (2.5 mg total) by mouth 2 (two) times daily.  Francia Greaves THYROID 15 MG tablet TAKE 1 TABLET BY MOUTH EVERY DAY  . Ascorbic Acid (VITAMIN C) 500 MG/5ML LIQD Take 1,500 mg by mouth daily.  . chlorthalidone (HYGROTON) 25 MG tablet Take 1 tablet (25 mg total) by mouth daily.  . COSOPT 22.3-6.8 MG/ML ophthalmic solution Place 1 drop into both eyes 2 (two) times daily. Reported on 06/21/2015  . diltiazem (CARDIZEM CD) 120 MG 24 hr capsule Take 1 capsule (120 mg total) by mouth daily.  [name]  irbesartan (AVAPRO) 300 MG tablet Take 1 tablet (300 mg total) by mouth daily.  . Multiple Vitamins-Minerals (ALIVE MULTI-VITAMIN) LIQD Take 15 mLs by mouth daily.  . NON FORMULARY Take 1 Can by mouth See admin instructions. Dillard Essex Organic Nutrition Meal Replacement Drink or Protein Shake (Made Without Gluten, Soy, Dairy, or Corn)- Drink 1 can by mouth once daily  . rosuvastatin (CRESTOR) 5 MG tablet Take 1 tablet (5 mg total) by mouth once a week.  . verapamil (CALAN) 120 MG tablet Take 120 mg by mouth daily.     Allergies:   Aceon [perindopril erbumine], Amlodipine, Aspirin, Azor [amlodipine-olmesartan], Beta adrenergic blockers, Hydralazine, Milk-related compounds, Other, Peanut-containing drug products,  Sulfamethoxazole-trimethoprim, and Vitamin d analogs   Social History   Socioeconomic History  . Marital status: Married    Spouse name: Not on file  . Number of children: Not on file  . Years of education: Not on file  . Highest education level: Not on file  Occupational History  . Not on file  Tobacco Use  . Smoking status: Never Smoker  . Smokeless tobacco: Never Used  Substance and Sexual Activity  . Alcohol use: No    Alcohol/week: 0.0 standard drinks  . Drug use: No  . Sexual activity: Never  Other Topics Concern  . Not on file  Social History Narrative   Married. Lives with husband. Married 65 years in 2016. [place] 22 years in 2016. 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 6 greatgrandkids.    Finished 10th grade.       Retired from working in [employer] for [employer]         Social Determinants of Radio broadcast assistant Strain:   . Difficulty of Paying Living Expenses: Not on file  Food Insecurity:   . Worried About Charity fundraiser in the Last Year: Not on file  . Ran Out of Food in the Last Year: Not on file  Transportation Needs:   . Lack of Transportation (Medical): Not on file  . Lack of Transportation (Non-Medical): Not on file  Physical Activity:   . Days of Exercise per Week: Not on file  . Minutes of Exercise per Session: Not on file  Stress:   . Feeling of Stress : Not on file  Social Connections:   . Frequency of Communication with Friends and Family: Not on file  . Frequency of Social Gatherings with Friends and Family: Not on file  . Attends Religious Services: Not on file  . Active Member of Clubs or Organizations: Not on file  . Attends Archivist Meetings: Not on file  . Marital Status: Not on file     Family History:  The patient's   family history includes Cancer in her brother; Cervical cancer in her sister; Hypertension in her mother; Lung cancer in her brother; Throat cancer in her  brother; Ulcers in her father.   ROS:   Please see the history of present illness.    ROS All other systems reviewed and are negative.   PHYSICAL EXAM:   VS:  BP (!) 150/62   Pulse (!) 53   Ht 5\' 3"  (1.6 m)   Wt 122 lb 12.8 oz (55.7 kg)   SpO2 99%   BMI 21.75 kg/m   Physical Exam  GEN: Thin, looks much younger than stated age, in no acute distress  Neck: no JVD, carotid bruits, or masses Cardiac:RRR; no murmurs, rubs, or  gallops  Respiratory:  clear to auscultation bilaterally, normal work of breathing GI: soft, nontender, nondistended, + BS Ext: without cyanosis, clubbing, or edema, Good distal pulses bilaterally Neuro:  Alert and Oriented x 3 Psych: euthymic mood, full affect  Wt Readings from Last 3 Encounters:  04/25/19 122 lb 12.8 oz (55.7 kg)  04/06/19 123 lb 6.4 oz (56 kg)  03/30/19 123 lb 3.2 oz (55.9 kg)      Studies/Labs Reviewed:   EKG:  EKG is not ordered today.    Recent Labs: 07/28/2018: ALT 8 12/27/2018: TSH 4.26 03/24/2019: BUN 38; Creatinine, Ser 1.52; Hemoglobin 11.4; Platelets 185; Potassium 4.0; Sodium 138   Lipid Panel    Component Value Date/Time   CHOL 199 07/28/2018 1423   TRIG 110.0 07/28/2018 1423   HDL 53.10 07/28/2018 1423   CHOLHDL 4 07/28/2018 1423   VLDL 22.0 07/28/2018 1423   LDLCALC 124 (H) 07/28/2018 1423    Additional studies/ records that were reviewed today include:  Echo 08/02/13: Normal LV function, LVEF=55-60%. Trace TR     ASSESSMENT:    1. Paroxysmal atrial fibrillation (HCC)   2. Essential hypertension   3. PAD (peripheral artery disease) ([place])   4. Medication management      PLAN:  In order of problems listed above:  PAF/PACs/PVCs with episode of atrial fibrillation documented by EMS 03/24/2019 and prior maze procedure for PAF.  CHA2DS2-VASc equals 7 and started on Eliquis 2.5 mg twice daily lower dose because of age over 64 and weight under 60 kg.  Repeat CBC and BMET today.  No recent palpitations.  No  bleeding problems on Eliquis.  Essential hypertension diltiazem added and hydralazine stopped last office visit-blood pressure trending higher at 150/60, pulse still in the 40s and 50s.  We will hold off on restarting hydralazine at this time.  She does watch her salt and will call us if it trends higher than 8/50 systolic and consider restarting hydralazine.  PAD followed by Dr. Gwenlyn Found ABIs normal 11/2018  Covid 19 vaccine recommended. She's waiting for her daughter to give her the "ok"  Medication Adjustments/Labs and Tests Ordered: Current medicines are reviewed at length with the patient today.  Concerns regarding medicines are outlined above.  Medication changes, Labs and Tests ordered today are listed in the Patient Instructions below. Patient Instructions  Medication Instructions:   Your physician recommends that you continue on your current medications as directed. Please refer to the Current Medication list given to you today.  *If you need a refill on your cardiac medications before your next appointment, please call your pharmacy*   Lab Work:   BMET AND CBC TODAY   If you have labs (blood work) drawn today and your tests are completely normal, you will receive your results only by: [name] MyChart Message (if you have MyChart) OR . A paper copy in the mail If you have any lab test that is abnormal or we need to change your treatment, we will call you to review the results.   Testing/Procedures: NONE ORDERED  TODAY   Follow-Up: At [facility], you and your health needs are our priority.  As part of our continuing mission to provide you with exceptional heart care, we have created designated Provider Care Teams.  These Care Teams include your primary Cardiologist (physician) and Advanced Practice Providers (APPs -  Physician Assistants and Nurse Practitioners) who all work together to provide you with the care you need, when you need it.  We recommend  signing up for the patient  portal called "MyChart".  Sign up information is provided on this After Visit Summary.  MyChart is used to connect with patients for Virtual Visits (Telemedicine).  Patients are able to view lab/test results, encounter notes, upcoming appointments, etc.  Non-urgent messages can be sent to your provider as well.   To learn more about what you can do with MyChart, go to [email or web address].    Your next appointment:   2-3  month(s)  The format for your next appointment:   In Person  Provider:   You may see Lauree Chandler, MD or one of the following Advanced Practice Providers on your designated Care Team:    Melina Copa, PA-C  Ermalinda Barrios, PA-C    Other Instructions   PLEASE CALL CLINIC IF BLOOD PRESSURE IS STAYING ELEVATED 160 TOP NUMBER    Low-Sodium Eating Plan Sodium, which is an element that makes up salt, helps you maintain a healthy balance of fluids in your body. Too much sodium can increase your blood pressure and cause fluid and waste to be held in your body. Your health care provider or dietitian may recommend following this plan if you have high blood pressure (hypertension), kidney disease, liver disease, or heart failure. Eating less sodium can help lower your blood pressure, reduce swelling, and protect your heart, liver, and kidneys. What are tips for following this plan? General guidelines  Most people on this plan should limit their sodium intake to 1,500-2,000 mg (milligrams) of sodium each day. Reading food labels   The Nutrition Facts label lists the amount of sodium in one serving of the food. If you eat more than one serving, you must multiply the listed amount of sodium by the number of servings.  Choose foods with less than 140 mg of sodium per serving.  Avoid foods with 300 mg of sodium or more per serving. Shopping  Look for lower-sodium products, often labeled as "low-sodium" or "no salt added."  Always check the sodium content even if  foods are labeled as "unsalted" or "no salt added".  Buy fresh foods. ? Avoid canned foods and premade or frozen meals. ? Avoid canned, cured, or processed meats  Buy breads that have less than 80 mg of sodium per slice. Cooking  Eat more home-cooked food and less restaurant, buffet, and fast food.  Avoid adding salt when cooking. Use salt-free seasonings or herbs instead of table salt or sea salt. Check with your health care provider or pharmacist before using salt substitutes.  Cook with plant-based oils, such as canola, sunflower, or olive oil. Meal planning  When eating at a restaurant, ask that your food be prepared with less salt or no salt, if possible.  Avoid foods that contain MSG (monosodium glutamate). MSG is sometimes added to [place] food, bouillon, and some canned foods. What foods are recommended? The items listed may not be a complete list. Talk with your dietitian about what dietary choices are best for you. Grains Low-sodium cereals, including oats, puffed wheat and rice, and shredded wheat. Low-sodium crackers. Unsalted rice. Unsalted pasta. Low-sodium bread. Whole-grain breads and whole-grain pasta. Vegetables Fresh or frozen vegetables. "No salt added" canned vegetables. "No salt added" tomato sauce and paste. Low-sodium or reduced-sodium tomato and vegetable juice. Fruits Fresh, frozen, or canned fruit. Fruit juice. Meats and other protein foods Fresh or frozen (no salt added) meat, poultry, seafood, and fish. Low-sodium canned tuna and salmon. Unsalted nuts. Dried peas, beans, and lentils without added salt.  Unsalted canned beans. Eggs. Unsalted nut butters. Dairy Milk. Soy milk. Cheese that is naturally low in sodium, such as ricotta cheese, fresh mozzarella, or Swiss cheese Low-sodium or reduced-sodium cheese. Cream cheese. Yogurt. Fats and oils Unsalted butter. Unsalted margarine with no trans fat. Vegetable oils such as canola or olive oils. Seasonings and  other foods Fresh and dried herbs and spices. Salt-free seasonings. Low-sodium mustard and ketchup. Sodium-free salad dressing. Sodium-free light mayonnaise. Fresh or refrigerated horseradish. Lemon juice. Vinegar. Homemade, reduced-sodium, or low-sodium soups. Unsalted popcorn and pretzels. Low-salt or salt-free chips. What foods are not recommended? The items listed may not be a complete list. Talk with your dietitian about what dietary choices are best for you. Grains Instant hot cereals. Bread stuffing, pancake, and biscuit mixes. Croutons. Seasoned rice or pasta mixes. Noodle soup cups. Boxed or frozen macaroni and cheese. Regular salted crackers. Self-rising flour. Vegetables Sauerkraut, pickled vegetables, and relishes. Olives. [place] fries. Onion rings. Regular canned vegetables (not low-sodium or reduced-sodium). Regular canned tomato sauce and paste (not low-sodium or reduced-sodium). Regular tomato and vegetable juice (not low-sodium or reduced-sodium). Frozen vegetables in sauces. Meats and other protein foods Meat or fish that is salted, canned, smoked, spiced, or pickled. Bacon, ham, sausage, hotdogs, corned beef, chipped beef, packaged lunch meats, salt pork, jerky, pickled herring, anchovies, regular canned tuna, sardines, salted nuts. Dairy Processed cheese and cheese spreads. Cheese curds. Blue cheese. Feta cheese. String cheese. Regular cottage cheese. Buttermilk. Canned milk. Fats and oils Salted butter. Regular margarine. Ghee. Bacon fat. Seasonings and other foods Onion salt, garlic salt, seasoned salt, table salt, and sea salt. Canned and packaged gravies. Worcestershire sauce. Tartar sauce. Barbecue sauce. Teriyaki sauce. Soy sauce, including reduced-sodium. Steak sauce. Fish sauce. Oyster sauce. Cocktail sauce. Horseradish that you find on the shelf. Regular ketchup and mustard. Meat flavorings and tenderizers. Bouillon cubes. Hot sauce and Tabasco sauce. Premade or packaged  marinades. Premade or packaged taco seasonings. Relishes. Regular salad dressings. Salsa. Potato and tortilla chips. Corn chips and puffs. Salted popcorn and pretzels. Canned or dried soups. Pizza. Frozen entrees and pot pies. Summary  Eating less sodium can help lower your blood pressure, reduce swelling, and protect your heart, liver, and kidneys.  Most people on this plan should limit their sodium intake to 1,500-2,000 mg (milligrams) of sodium each day.  Canned, boxed, and frozen foods are high in sodium. Restaurant foods, fast foods, and pizza are also very high in sodium. You also get sodium by adding salt to food.  Try to cook at home, eat more fresh fruits and vegetables, and eat less fast food, canned, processed, or prepared foods. This information is not intended to replace advice given to you by your health care provider. Make sure you discuss any questions you have with your health care provider. Document Revised: 01/22/2017 Document Reviewed: 02/03/2016 Elsevier Patient Education  2020 [place], Ermalinda Barrios, [place]  04/25/2019 10:04 AM    [facility] [address], [place], [place]  [address] Phone: [phone]; Fax: [phone]

## 2019-04-12 NOTE — Telephone Encounter (Signed)
Called patient states pharmacy does not have 150mg  wanted to know if it can be changed to 300mg  once a day. Patient wanted to make sure that was ok with you.

## 2019-04-12 NOTE — Telephone Encounter (Signed)
Yes thanks- im ok with 300 mg oce a day of irbesartan

## 2019-04-13 MED ORDER — IRBESARTAN 300 MG PO TABS: 300.0000 mg | ORAL_TABLET | Freq: Every day | ORAL | 3 refills | Status: DC

## 2019-04-13 NOTE — Telephone Encounter (Signed)
Called [facility] spoke to Hunter told him okay to change Irbesartan from 150 mg BID to 300 mg one tablet daily due to unavailable. Chandler verbalized understanding.   Spoke to pt told her Dr. Yong Channel said okay to take Irbesartan 300 mg tablet once a day. I have called the pharmacy and changed the Rx, they are getting it ready for you. Pt verbalized understanding.

## 2019-04-13 NOTE — Addendum Note (Signed)
Addended by: Marian Sorrow on: 04/13/2019 03:15 PM   Modules accepted: Orders

## 2019-04-21 ENCOUNTER — Ambulatory Visit
Admission: RE | Admit: 2019-04-21 | Discharge: 2019-04-21 | Disposition: A | Discharge status: Home / Self Care | Payer: Medicare Other | Source: Ambulatory Visit | Attending: Family Medicine | Admitting: Family Medicine

## 2019-04-21 ENCOUNTER — Other Ambulatory Visit: Payer: Self-pay

## 2019-04-21 DIAGNOSIS — Z1231 Encounter for screening mammogram for malignant neoplasm of breast: Secondary | ICD-10-CM | POA: Diagnosis not present

## 2019-04-25 ENCOUNTER — Encounter: Payer: Self-pay | Admitting: Physician Assistant

## 2019-04-25 ENCOUNTER — Other Ambulatory Visit: Payer: Self-pay

## 2019-04-25 ENCOUNTER — Ambulatory Visit (INDEPENDENT_AMBULATORY_CARE_PROVIDER_SITE_OTHER): Payer: Medicare Other | Admitting: Physician Assistant

## 2019-04-25 VITALS — BP 150/62 | HR 53 | Ht 63.0 in | Wt 122.8 lb

## 2019-04-25 DIAGNOSIS — I480 Paroxysmal atrial fibrillation: Secondary | ICD-10-CM | POA: Diagnosis not present

## 2019-04-25 DIAGNOSIS — I739 Peripheral vascular disease, unspecified: Secondary | ICD-10-CM

## 2019-04-25 DIAGNOSIS — I10 Essential (primary) hypertension: Secondary | ICD-10-CM | POA: Diagnosis not present

## 2019-04-25 DIAGNOSIS — Z79899 Other long term (current) drug therapy: Secondary | ICD-10-CM

## 2019-04-25 LAB — CBC
Hematocrit: 32.5 % — ABNORMAL LOW (ref 34.0–46.6)
Hemoglobin: 10.5 g/dL — ABNORMAL LOW (ref 11.1–15.9)
MCH: 27.3 pg (ref 26.6–33.0)
MCHC: 32.3 g/dL (ref 31.5–35.7)
MCV: 85 fL (ref 79–97)
Platelets: 175 10*3/uL (ref 150–450)
RBC: 3.84 x10E6/uL (ref 3.77–5.28)
RDW: 13 % (ref 11.7–15.4)
WBC: 3.5 10*3/uL (ref 3.4–10.8)

## 2019-04-25 LAB — BASIC METABOLIC PANEL
BUN/Creatinine Ratio: 30 — ABNORMAL HIGH (ref 12–28)
BUN: 45 mg/dL — ABNORMAL HIGH (ref 10–36)
CO2: 24 mmol/L (ref 20–29)
Calcium: 9.2 mg/dL (ref 8.7–10.3)
Chloride: 103 mmol/L (ref 96–106)
Creatinine, Ser: 1.51 mg/dL — ABNORMAL HIGH (ref 0.57–1.00)
GFR calc Af Amer: 35 mL/min/{1.73_m2} — ABNORMAL LOW (ref >59)
GFR calc non Af Amer: 30 mL/min/{1.73_m2} — ABNORMAL LOW (ref >59)
Glucose: 93 mg/dL (ref 65–99)
Potassium: 4.1 mmol/L (ref 3.5–5.2)
Sodium: 140 mmol/L (ref 134–144)

## 2019-04-25 NOTE — Patient Instructions (Addendum)
Medication Instructions:   Your physician recommends that you continue on your current medications as directed. Please refer to the Current Medication list given to you today.  *If you need a refill on your cardiac medications before your next appointment, please call your pharmacy*   Lab Work:   BMET AND CBC TODAY   If you have labs (blood work) drawn today and your tests are completely normal, you will receive your results only by: [name] MyChart Message (if you have MyChart) OR . A paper copy in the mail If you have any lab test that is abnormal or we need to change your treatment, we will call you to review the results.   Testing/Procedures: NONE ORDERED  TODAY   Follow-Up: At [facility], you and your health needs are our priority.  As part of our continuing mission to provide you with exceptional heart care, we have created designated Provider Care Teams.  These Care Teams include your primary Cardiologist (physician) and Advanced Practice Providers (APPs -  Physician Assistants and Nurse Practitioners) who all work together to provide you with the care you need, when you need it.  We recommend signing up for the patient portal called "MyChart".  Sign up information is provided on this After Visit Summary.  MyChart is used to connect with patients for Virtual Visits (Telemedicine).  Patients are able to view lab/test results, encounter notes, upcoming appointments, etc.  Non-urgent messages can be sent to your provider as well.   To learn more about what you can do with MyChart, go to [email or web address].    Your next appointment:   2-3  month(s)  The format for your next appointment:   In Person  Provider:   You may see Lauree Chandler, MD or one of the following Advanced Practice Providers on your designated Care Team:    Melina Copa, PA-C  Ermalinda Barrios, PA-C    Other Instructions   PLEASE CALL CLINIC IF BLOOD PRESSURE IS STAYING ELEVATED 160 TOP NUMBER     Low-Sodium Eating Plan Sodium, which is an element that makes up salt, helps you maintain a healthy balance of fluids in your body. Too much sodium can increase your blood pressure and cause fluid and waste to be held in your body. Your health care provider or dietitian may recommend following this plan if you have high blood pressure (hypertension), kidney disease, liver disease, or heart failure. Eating less sodium can help lower your blood pressure, reduce swelling, and protect your heart, liver, and kidneys. What are tips for following this plan? General guidelines  Most people on this plan should limit their sodium intake to 1,500-2,000 mg (milligrams) of sodium each day. Reading food labels   The Nutrition Facts label lists the amount of sodium in one serving of the food. If you eat more than one serving, you must multiply the listed amount of sodium by the number of servings.  Choose foods with less than 140 mg of sodium per serving.  Avoid foods with 300 mg of sodium or more per serving. Shopping  Look for lower-sodium products, often labeled as "low-sodium" or "no salt added."  Always check the sodium content even if foods are labeled as "unsalted" or "no salt added".  Buy fresh foods. ? Avoid canned foods and premade or frozen meals. ? Avoid canned, cured, or processed meats  Buy breads that have less than 80 mg of sodium per slice. Cooking  Eat more home-cooked food and less restaurant, buffet, and fast  food.  Avoid adding salt when cooking. Use salt-free seasonings or herbs instead of table salt or sea salt. Check with your health care provider or pharmacist before using salt substitutes.  Cook with plant-based oils, such as canola, sunflower, or olive oil. Meal planning  When eating at a restaurant, ask that your food be prepared with less salt or no salt, if possible.  Avoid foods that contain MSG (monosodium glutamate). MSG is sometimes added to [place] food,  bouillon, and some canned foods. What foods are recommended? The items listed may not be a complete list. Talk with your dietitian about what dietary choices are best for you. Grains Low-sodium cereals, including oats, puffed wheat and rice, and shredded wheat. Low-sodium crackers. Unsalted rice. Unsalted pasta. Low-sodium bread. Whole-grain breads and whole-grain pasta. Vegetables Fresh or frozen vegetables. "No salt added" canned vegetables. "No salt added" tomato sauce and paste. Low-sodium or reduced-sodium tomato and vegetable juice. Fruits Fresh, frozen, or canned fruit. Fruit juice. Meats and other protein foods Fresh or frozen (no salt added) meat, poultry, seafood, and fish. Low-sodium canned tuna and salmon. Unsalted nuts. Dried peas, beans, and lentils without added salt. Unsalted canned beans. Eggs. Unsalted nut butters. Dairy Milk. Soy milk. Cheese that is naturally low in sodium, such as ricotta cheese, fresh mozzarella, or Swiss cheese Low-sodium or reduced-sodium cheese. Cream cheese. Yogurt. Fats and oils Unsalted butter. Unsalted margarine with no trans fat. Vegetable oils such as canola or olive oils. Seasonings and other foods Fresh and dried herbs and spices. Salt-free seasonings. Low-sodium mustard and ketchup. Sodium-free salad dressing. Sodium-free light mayonnaise. Fresh or refrigerated horseradish. Lemon juice. Vinegar. Homemade, reduced-sodium, or low-sodium soups. Unsalted popcorn and pretzels. Low-salt or salt-free chips. What foods are not recommended? The items listed may not be a complete list. Talk with your dietitian about what dietary choices are best for you. Grains Instant hot cereals. Bread stuffing, pancake, and biscuit mixes. Croutons. Seasoned rice or pasta mixes. Noodle soup cups. Boxed or frozen macaroni and cheese. Regular salted crackers. Self-rising flour. Vegetables Sauerkraut, pickled vegetables, and relishes. Olives. [place] fries. Onion rings.  Regular canned vegetables (not low-sodium or reduced-sodium). Regular canned tomato sauce and paste (not low-sodium or reduced-sodium). Regular tomato and vegetable juice (not low-sodium or reduced-sodium). Frozen vegetables in sauces. Meats and other protein foods Meat or fish that is salted, canned, smoked, spiced, or pickled. Bacon, ham, sausage, hotdogs, corned beef, chipped beef, packaged lunch meats, salt pork, jerky, pickled herring, anchovies, regular canned tuna, sardines, salted nuts. Dairy Processed cheese and cheese spreads. Cheese curds. Blue cheese. Feta cheese. String cheese. Regular cottage cheese. Buttermilk. Canned milk. Fats and oils Salted butter. Regular margarine. Ghee. Bacon fat. Seasonings and other foods Onion salt, garlic salt, seasoned salt, table salt, and sea salt. Canned and packaged gravies. Worcestershire sauce. Tartar sauce. Barbecue sauce. Teriyaki sauce. Soy sauce, including reduced-sodium. Steak sauce. Fish sauce. Oyster sauce. Cocktail sauce. Horseradish that you find on the shelf. Regular ketchup and mustard. Meat flavorings and tenderizers. Bouillon cubes. Hot sauce and Tabasco sauce. Premade or packaged marinades. Premade or packaged taco seasonings. Relishes. Regular salad dressings. Salsa. Potato and tortilla chips. Corn chips and puffs. Salted popcorn and pretzels. Canned or dried soups. Pizza. Frozen entrees and pot pies. Summary  Eating less sodium can help lower your blood pressure, reduce swelling, and protect your heart, liver, and kidneys.  Most people on this plan should limit their sodium intake to 1,500-2,000 mg (milligrams) of sodium each day.  Canned, boxed,  and frozen foods are high in sodium. Restaurant foods, fast foods, and pizza are also very high in sodium. You also get sodium by adding salt to food.  Try to cook at home, eat more fresh fruits and vegetables, and eat less fast food, canned, processed, or prepared foods. This information is  not intended to replace advice given to you by your health care provider. Make sure you discuss any questions you have with your health care provider. Document Revised: 01/22/2017 Document Reviewed: 02/03/2016 Elsevier Patient Education  2020 [employer].

## 2019-04-26 ENCOUNTER — Other Ambulatory Visit: Payer: Self-pay

## 2019-04-26 DIAGNOSIS — I480 Paroxysmal atrial fibrillation: Secondary | ICD-10-CM

## 2019-05-12 ENCOUNTER — Telehealth: Payer: Self-pay | Admitting: Cardiovascular Disease

## 2019-05-12 MED ORDER — HYDRALAZINE HCL 25 MG PO TABS: 25.0000 mg | ORAL_TABLET | Freq: Three times a day (TID) | ORAL | 3 refills | Status: DC

## 2019-05-12 NOTE — Telephone Encounter (Signed)
Called patient and adv to restart hydralazine 25 mg tid.  She has the medication still from when it was ordered previously.  Pt confirms it is 25 mg tablets.  Discussed that she should continue taking Cardizem capsules as well.

## 2019-05-12 NOTE — Telephone Encounter (Signed)
Reviewed with patient.   She sees her BP creeping up and was told to call us if it trends up above 160.  Her HR today is 50.  She does not have headache, dizziness or lightheadedness.  Feeling her normal self.    From last ov note 04/25/19 from Gerrianne Scale PA-C:   Essential hypertension diltiazem added and hydralazine stopped last office visit-blood pressure trending higher at 150/60, pulse still in the 40s and 50s.  We will hold off on restarting hydralazine at this time.  She does watch her salt and will call us if it trends higher than 5/72 systolic and consider restarting hydralazine.  Pt states hydralazine was stopped when she started diltiazem.  Pt aware I will forward to Dr. Angelena Form and Ermalinda Barrios and will call her back with recommendations.

## 2019-05-12 NOTE — Telephone Encounter (Signed)
   Pt is calling she would like to speak with a nurse, she said she was advise to call when her BP is elevated, last night it was 170/50 and this morning the systolic is at 499, she wanted to know if Dr. Angelena Form will change her medicationsp.  Please call

## 2019-05-12 NOTE — Telephone Encounter (Signed)
Let's start back her hydralazine at 25 mg TID. Thanks, chris

## 2019-05-16 ENCOUNTER — Other Ambulatory Visit: Payer: Medicare Other

## 2019-05-16 ENCOUNTER — Telehealth: Payer: Self-pay | Admitting: Family Medicine

## 2019-05-16 ENCOUNTER — Encounter (HOSPITAL_COMMUNITY): Payer: Self-pay | Admitting: Emergency Medicine

## 2019-05-16 ENCOUNTER — Other Ambulatory Visit: Payer: Self-pay

## 2019-05-16 ENCOUNTER — Emergency Department (HOSPITAL_COMMUNITY)
Admission: EM | Admit: 2019-05-16 | Discharge: 2019-05-17 | Disposition: A | Discharge status: Home / Self Care | Payer: Medicare Other | Attending: Emergency Medicine | Admitting: Emergency Medicine

## 2019-05-16 ENCOUNTER — Emergency Department (HOSPITAL_COMMUNITY): Payer: Medicare Other

## 2019-05-16 DIAGNOSIS — Z7901 Long term (current) use of anticoagulants: Secondary | ICD-10-CM | POA: Insufficient documentation

## 2019-05-16 DIAGNOSIS — I129 Hypertensive chronic kidney disease with stage 1 through stage 4 chronic kidney disease, or unspecified chronic kidney disease: Secondary | ICD-10-CM | POA: Insufficient documentation

## 2019-05-16 DIAGNOSIS — N183 Chronic kidney disease, stage 3 unspecified: Secondary | ICD-10-CM | POA: Insufficient documentation

## 2019-05-16 DIAGNOSIS — Z91010 Allergy to peanuts: Secondary | ICD-10-CM | POA: Diagnosis not present

## 2019-05-16 DIAGNOSIS — Z79899 Other long term (current) drug therapy: Secondary | ICD-10-CM | POA: Insufficient documentation

## 2019-05-16 DIAGNOSIS — Z8673 Personal history of transient ischemic attack (TIA), and cerebral infarction without residual deficits: Secondary | ICD-10-CM | POA: Insufficient documentation

## 2019-05-16 DIAGNOSIS — I480 Paroxysmal atrial fibrillation: Secondary | ICD-10-CM

## 2019-05-16 DIAGNOSIS — R0789 Other chest pain: Secondary | ICD-10-CM | POA: Diagnosis not present

## 2019-05-16 DIAGNOSIS — R079 Chest pain, unspecified: Secondary | ICD-10-CM | POA: Diagnosis not present

## 2019-05-16 DIAGNOSIS — E039 Hypothyroidism, unspecified: Secondary | ICD-10-CM | POA: Diagnosis not present

## 2019-05-16 LAB — CBC
HCT: 35.7 % — ABNORMAL LOW (ref 36.0–46.0)
Hemoglobin: 11.5 g/dL — ABNORMAL LOW (ref 12.0–15.0)
MCH: 27.9 pg (ref 26.0–34.0)
MCHC: 32.2 g/dL (ref 30.0–36.0)
MCV: 86.7 fL (ref 80.0–100.0)
Platelets: 189 10*3/uL (ref 150–400)
RBC: 4.12 MIL/uL (ref 3.87–5.11)
RDW: 13.6 % (ref 11.5–15.5)
WBC: 3.3 10*3/uL — ABNORMAL LOW (ref 4.0–10.5)
nRBC: 0 % (ref 0.0–0.2)

## 2019-05-16 LAB — BASIC METABOLIC PANEL
Anion gap: 12 (ref 5–15)
BUN: 34 mg/dL — ABNORMAL HIGH (ref 8–23)
CO2: 24 mmol/L (ref 22–32)
Calcium: 9 mg/dL (ref 8.9–10.3)
Chloride: 100 mmol/L (ref 98–111)
Creatinine, Ser: 1.48 mg/dL — ABNORMAL HIGH (ref 0.44–1.00)
GFR calc Af Amer: 36 mL/min — ABNORMAL LOW (ref >60)
GFR calc non Af Amer: 31 mL/min — ABNORMAL LOW (ref >60)
Glucose, Bld: 101 mg/dL — ABNORMAL HIGH (ref 70–99)
Potassium: 4 mmol/L (ref 3.5–5.1)
Sodium: 136 mmol/L (ref 135–145)

## 2019-05-16 LAB — TROPONIN I (HIGH SENSITIVITY)
Troponin I (High Sensitivity): 21 ng/L — ABNORMAL HIGH (ref <18)
Troponin I (High Sensitivity): 27 ng/L — ABNORMAL HIGH (ref <18)

## 2019-05-16 MED ORDER — LIDOCAINE 5 % EX PTCH: 1.0000 | MEDICATED_PATCH | CUTANEOUS | 0 refills | Status: DC

## 2019-05-16 MED ORDER — SODIUM CHLORIDE 0.9% FLUSH: 3.0000 mL | Freq: Once | INTRAVENOUS | Status: DC

## 2019-05-16 MED ORDER — LIDOCAINE VISCOUS HCL 2 % MT SOLN
15.0000 mL | Freq: Once | OROMUCOSAL | Status: AC
  Administered 2019-05-16: 21:00:00 15 mL via ORAL
  Filled 2019-05-16: qty 15

## 2019-05-16 MED ORDER — ALUM & MAG HYDROXIDE-SIMETH 200-200-20 MG/5ML PO SUSP
30.0000 mL | Freq: Once | ORAL | Status: AC
  Administered 2019-05-16: 30 mL via ORAL
  Filled 2019-05-16: qty 30

## 2019-05-16 NOTE — Telephone Encounter (Signed)
Patient spoke with [facility] on 05/16/19 at 3:45pm stating she was experiencing pain when she inhales and exhales right under her left bust and feels like she has a fatty tissue right up under the bustline.  States it felt like gas.  States she has pain in her back.  Symptoms started 2 days ago.  States she feels like gas is locked in more constant.  States when she walks or moves a certain way she feels pain.  Patient was advised by Wayne Sever RN to go to ED now.  Patient states she would comply.

## 2019-05-16 NOTE — ED Provider Notes (Signed)
[facility] EMERGENCY DEPARTMENT Provider Note   CSN: [id number] Arrival date & time: 05/16/19  1636     History Chief Complaint  Patient presents with  . Chest Pain    [name] is a 86 y.o. female.  Patient with history of hypertension, high cholesterol, peripheral arterial disease, stroke, PAF on Eliquis-- presents to the emergency department with complaint of left anterior lower chest pain over the lower margin of her ribs.  Patient has what feels like a lipoma in the area which has been there for years.  She describes a pressure sensation like gas in this area.  She states that she has been burping a lot and feels better after she burps.  She reports that the pain is worse with movement and when she coughs or takes a deep breath.  Pain radiates around her side but she denies any pain in her upper back, shoulders or between her shoulder blades.  She has not had any vomiting, diaphoresis, or pain worse with exertion.  No injuries to the area.  She has not really had symptoms like this before.  She is eating and drinking.  She has not tried any medications at home.  No weakness, numbness, or tingling in her arms or her legs. Patient denies signs of stroke including: facial droop, slurred speech, aphasia, weakness/numbness in extremities, imbalance/trouble walking.  She has not taken her blood pressure medications this afternoon or this evening.  She reports recent change in her blood pressure medications and recent for control of blood pressures.        Past Medical History:  Diagnosis Date  . Arthritis   . Chicken pox   . Glaucoma of both eyes   . High cholesterol   . Hypertension    a. Normal renal arteries by duplex 2013.  [name] Hypothyroidism   . Migraine   . PAD (peripheral artery disease) ([place])    a. 04/2013: s/p PCI to occluded right popliteal artery   . PAF (paroxysmal atrial fibrillation) ([place])    a. s/p MAZE 2006. b. Event monitor 2013: NSR with PACs.   . Sinus bradycardia    a. 03/2011 during hospital admission - beta blocker stopped.  . Stroke [facility])    a. Pt reports 3-4 prior strokes without residual sx. b. CVA 2013 (presented with headache) - started on Plavix. Event monitor as outpt - no AF.    Patient Active Problem List   Diagnosis Date Noted  . CKD (chronic kidney disease), stage III 06/15/2018  . Low vitamin B12 level 12/09/2017  . Anemia 08/16/2017  . Chronic cough 08/12/2015  . Perennial allergic rhinitis 08/12/2015  . GERD (gastroesophageal reflux disease) 06/25/2014  . Hyperglycemia 06/25/2014  . Vitamin D deficiency 06/25/2014  . Hypothyroidism 06/07/2014  . Hyperlipidemia 06/07/2014  . Intracervical pessary 06/07/2014  . Sinus bradycardia 05/19/2013  . PAD (peripheral artery disease) ([place]) 05/10/2013  . PAC (premature atrial contraction) 05/20/2011  . PAF (paroxysmal atrial fibrillation) ([place])   . History of stroke   . Hypertension     Past Surgical History:  Procedure Laterality Date  . CATARACT EXTRACTION Left 11/09/2017  . LOWER EXTREMITY ANGIOGRAM N/A 05/15/2013   Procedure: LOWER EXTREMITY ANGIOGRAM;  Surgeon: Lorretta Harp, MD;  Location: [facility] CATH LAB;  Service: Cardiovascular;  Laterality: N/A;  . MAZE  ~ 2006  . POPLITEAL ARTERY STENT  05/15/2013     OB History   No obstetric history on file.  Family History  Problem Relation Age of Onset  . Hypertension Mother   . Ulcers Father   . Cervical cancer Sister   . Throat cancer Brother   . Lung cancer Brother   . Cancer Brother   . Allergic rhinitis Neg Hx   . Angioedema Neg Hx   . Asthma Neg Hx   . Atopy Neg Hx   . Eczema Neg Hx   . Immunodeficiency Neg Hx   . Urticaria Neg Hx     Social History   Tobacco Use  . Smoking status: Never Smoker  . Smokeless tobacco: Never Used  Substance Use Topics  . Alcohol use: No    Alcohol/week: 0.0 standard drinks  . Drug use: No    Home Medications Prior to Admission medications     Medication Sig Start Date End Date Taking? Authorizing Provider  apixaban (ELIQUIS) 2.5 MG TABS tablet Take 1 tablet (2.5 mg total) by mouth 2 (two) times daily. 04/06/19  Yes Burnell Blanks, MD  ARMOUR THYROID 15 MG tablet TAKE 1 TABLET BY MOUTH EVERY DAY Patient taking differently: Take 15 mg by mouth daily before breakfast.  03/30/19  Yes [name], MD  Ascorbic Acid (VITAMIN C) 500 MG/5ML LIQD Take 1,500 mg by mouth daily.   Yes [provider]  chlorthalidone (HYGROTON) 25 MG tablet Take 1 tablet (25 mg total) by mouth daily. 03/03/19  Yes [name], MD  COSOPT 22.3-6.8 MG/ML ophthalmic solution Place 1 drop into both eyes 2 (two) times daily. Reported on 06/21/2015 04/24/14  Yes [provider]  diltiazem (CARDIZEM CD) 120 MG 24 hr capsule Take 1 capsule (120 mg total) by mouth daily. 04/06/19  Yes Burnell Blanks, MD  hydrALAZINE (APRESOLINE) 25 MG tablet Take 1 tablet (25 mg total) by mouth 3 (three) times daily. 05/12/19  Yes Burnell Blanks, MD  irbesartan (AVAPRO) 300 MG tablet Take 1 tablet (300 mg total) by mouth daily. Patient taking differently: Take 150 mg by mouth in the morning and at bedtime.  04/13/19  Yes [name], MD  Multiple Vitamins-Minerals (ALIVE MULTI-VITAMIN) LIQD Take 15 mLs by mouth daily.   Yes [provider]  NON FORMULARY Take 1 Can by mouth See admin instructions. Dillard [place] Organic Nutrition Meal Replacement Drink or Protein Shake (Made Without Gluten, Soy, Dairy, or Corn)- Drink 1 can by mouth once daily   Yes [provider]  rosuvastatin (CRESTOR) 5 MG tablet Take 1 tablet (5 mg total) by mouth once a week. Patient taking differently: Take 5 mg by mouth every Saturday.  08/01/18  Yes [name], MD  lidocaine (LIDODERM) 5 % Place 1 patch onto the skin daily. Remove & Discard patch within 12 hours or as directed by MD 05/16/19   Carlisle Cater, PA-C    Allergies    Aceon  [perindopril erbumine], Amlodipine, Aspirin, Azor [amlodipine-olmesartan], Beta adrenergic blockers, Hydralazine, Milk-related compounds, Other, Peanut-containing drug products, Sulfamethoxazole-trimethoprim, and Vitamin d analogs  Review of Systems   Review of Systems  Constitutional: Negative for diaphoresis and fever.  Eyes: Negative for redness.  Respiratory: Negative for cough and shortness of breath.   Cardiovascular: Positive for chest pain. Negative for palpitations and leg swelling.  Gastrointestinal: Negative for abdominal pain, nausea and vomiting.  Genitourinary: Negative for dysuria.  Musculoskeletal: Negative for back pain and neck pain.  Skin: Negative for rash.  Neurological: Negative for syncope and light-headedness.  Psychiatric/Behavioral: The patient is not nervous/anxious.  Physical Exam Updated Vital Signs BP (!) 195/63 (BP Location: Right Arm) Comment: pt did not take BP meds  Pulse (!) 55   Temp 98 F (36.7 C) (Oral)   Resp 16   Ht 5\' 3"  (1.6 m)   Wt 55.3 kg   SpO2 100%   BMI 21.61 kg/m   Physical Exam Vitals and nursing note reviewed.  Constitutional:      Appearance: She is well-developed.  HENT:     Head: Normocephalic and atraumatic.  Eyes:     General:        Right eye: No discharge.        Left eye: No discharge.     Conjunctiva/sclera: Conjunctivae normal.  Cardiovascular:     Rate and Rhythm: Normal rate and regular rhythm.     Heart sounds: Gallop present. S4 sounds present.      Comments: S4 noted lower sternal border.  Patient has an approximately 3 cm soft tissue mass consistent with a lipoma overlying of the inferior margin of the left anterior ribs.  Palpation of this area reproduces the pain and patient winces a bit.  No overlying erythema or fluctuance. Pulmonary:     Effort: Pulmonary effort is normal.     Breath sounds: Normal breath sounds.  Abdominal:     Palpations: Abdomen is soft.     Tenderness: There is no abdominal  tenderness.  Musculoskeletal:     Cervical back: Normal range of motion and neck supple.  Skin:    General: Skin is warm and dry.  Neurological:     Mental Status: She is alert.     ED Results / Procedures / Treatments   Labs (all labs ordered are listed, but only abnormal results are displayed) Labs Reviewed  BASIC METABOLIC PANEL - Abnormal; Notable for the following components:      Result Value   Glucose, Bld 101 (*)    BUN 34 (*)    Creatinine, Ser 1.48 (*)    GFR calc non Af Amer 31 (*)    GFR calc Af Amer 36 (*)    All other components within normal limits  CBC - Abnormal; Notable for the following components:   WBC 3.3 (*)    Hemoglobin 11.5 (*)    HCT 35.7 (*)    All other components within normal limits  TROPONIN I (HIGH SENSITIVITY) - Abnormal; Notable for the following components:   Troponin I (High Sensitivity) 21 (*)    All other components within normal limits  TROPONIN I (HIGH SENSITIVITY)    ED ECG REPORT   Date: 05/16/2019  Rate: 66  Rhythm: normal sinus rhythm  QRS Axis: normal  Intervals: normal  ST/T Wave abnormalities: nonspecific T wave changes  Conduction Disutrbances:right bundle branch block  Narrative Interpretation:   Old EKG Reviewed: unchanged 1/21  I have personally reviewed the EKG tracing and agree with the computerized printout as noted.  Radiology DG Chest 2 View  Result Date: 05/16/2019 CLINICAL DATA:  Left-sided chest pain. EXAM: CHEST - 2 VIEW COMPARISON:  None. FINDINGS: There is no evidence of acute infiltrate, pleural effusion or pneumothorax. The heart size and mediastinal contours are within normal limits. Radiopaque surgical clips are seen overlying the left hilum. The visualized skeletal structures are unremarkable. IMPRESSION: No active cardiopulmonary disease. Electronically Signed   By: Virgina Norfolk M.D.   On: 05/16/2019 18:47    Procedures Procedures (including critical care time)  Medications Ordered in  ED Medications  sodium chloride flush (NS) 0.9 % injection 3 mL (has no administration in time range)  alum & mag hydroxide-simeth (MAALOX/MYLANTA) 200-200-20 MG/5ML suspension 30 mL (30 mLs Oral Given 05/16/19 2129)    And  lidocaine (XYLOCAINE) 2 % viscous mouth solution 15 mL (15 mLs Oral Given 05/16/19 2129)    ED Course  I have reviewed the triage vital signs and the nursing notes.  Pertinent labs & imaging results that were available during my care of the patient were reviewed by me and considered in my medical decision making (see chart for details).  Patient seen and examined. Work-up initiated. Medications ordered.  Pain is reproducible. Doubt ACS, PE, dissection. Will check 2nd troponin. Will give GI cocktail to see if this helps symptoms.   Vital signs reviewed and are as follows: BP (!) 195/63 (BP Location: Right Arm) Comment: pt did not take BP meds  Pulse (!) 55   Temp 98 F (36.7 C) (Oral)   Resp 16   Ht 5\' 3"  (1.6 m)   Wt 55.3 kg   SpO2 100%   BMI 21.61 kg/m   Patient vomited after GI cocktail.  Awaiting second troponin.  Patient discussed with and seen by Dr. Stark Jock.  Plan for discharged home with second troponin is stable.  EKG reviewed.  Abnormal, but unchanged from January 2021.  BP (!) 181/64   Pulse 65   Temp 98 F (36.7 C) (Oral)   Resp 16   Ht 5\' 3"  (1.6 m)   Wt 55.3 kg   SpO2 100%   BMI 21.61 kg/m   Patient was counseled to return with severe chest pain, especially if the pain is crushing or pressure-like and spreads to the arms, back, neck, or jaw, or if they have sweating, nausea, or shortness of breath with the pain. They were encouraged to call 911 with these symptoms.   The patient verbalized understanding and agreed.     MDM Rules/Calculators/A&P                      Patient with chest pain over the past 2 days that is reproducible with deep breathing, palpation over the chest wall, and movement.  Symptoms are atypical for ACS.  Patient  does not have any tachycardia, signs symptoms of DVT, or other concerning features of PE.  She is already anticoagulated on Xarelto.  Low concern for PE at this time.  Awaiting second troponin.  Chest x-ray is clear.  EKG abnormal but unchanged.  Doubt ACS.  GI etiology also possible.  Abdomen is soft and nontender without rebound or guarding.  Doubt emergent intra-abdominal etiology plan.  Symptomatic care with PCP follow-up as needed.    Final Clinical Impression(s) / ED Diagnoses Final diagnoses:  Chest wall pain    Rx / DC Orders ED Discharge Orders    None       Carlisle Cater, PA-C 05/16/19 2341    Veryl Speak, MD 05/16/19 2355

## 2019-05-16 NOTE — Discharge Instructions (Addendum)
Please read and follow all provided instructions.  Please make sure to take your nighttime medications when you get home.  Your diagnoses today include:  1. Chest wall pain     Tests performed today include: An EKG of your heart A chest x-ray - was normal Cardiac enzymes - a blood test for heart muscle damage Blood counts and electrolytes Vital signs. See below for your results today.   Medications prescribed:  None  Take any prescribed medications only as directed.  Follow-up instructions: Please follow-up with your primary care provider as soon as you can for further evaluation of your symptoms.   Return instructions:  SEEK IMMEDIATE MEDICAL ATTENTION IF: You have severe chest pain, especially if the pain is crushing or pressure-like and spreads to the arms, back, neck, or jaw, or if you have sweating, nausea (feeling sick to your stomach), or shortness of breath. THIS IS AN EMERGENCY. Don't wait to see if the pain will go away. Get medical help at once. Call 911 or 0 (operator). DO NOT drive yourself to the hospital.  Your chest pain gets worse and does not go away with rest.  You have an attack of chest pain lasting longer than usual, despite rest and treatment with the medications your caregiver has prescribed.  You wake from sleep with chest pain or shortness of breath. You feel dizzy or faint. You have chest pain not typical of your usual pain for which you originally saw your caregiver.  You have any other emergent concerns regarding your health.  Additional Information: Chest pain comes from many different causes. Your caregiver has diagnosed you as having chest pain that is not specific for one problem, but does not require admission.  You are at low risk for an acute heart condition or other serious illness.   Your vital signs today were: BP (!) 181/64   Pulse 65   Temp 98 F (36.7 C) (Oral)   Resp 16   Ht 5\' 3"  (1.6 m)   Wt 55.3 kg   SpO2 100%   BMI 21.61  kg/m  If your blood pressure (BP) was elevated above 135/85 this visit, please have this repeated by your doctor within one month. --------------

## 2019-05-16 NOTE — ED Triage Notes (Signed)
Pt states she started having CP under her left breast into her back. Pt states it feels like gas and worsens when she moves. This all started yesterday. Denies N/V

## 2019-05-17 LAB — BASIC METABOLIC PANEL
BUN/Creatinine Ratio: 20 (ref 12–28)
BUN: 30 mg/dL (ref 10–36)
CO2: 23 mmol/L (ref 20–29)
Calcium: 9.2 mg/dL (ref 8.7–10.3)
Chloride: 101 mmol/L (ref 96–106)
Creatinine, Ser: 1.49 mg/dL — ABNORMAL HIGH (ref 0.57–1.00)
GFR calc Af Amer: 35 mL/min/{1.73_m2} — ABNORMAL LOW (ref >59)
GFR calc non Af Amer: 30 mL/min/{1.73_m2} — ABNORMAL LOW (ref >59)
Glucose: 93 mg/dL (ref 65–99)
Potassium: 4 mmol/L (ref 3.5–5.2)
Sodium: 139 mmol/L (ref 134–144)

## 2019-05-17 LAB — CBC
Hematocrit: 35.6 % (ref 34.0–46.6)
Hemoglobin: 11.7 g/dL (ref 11.1–15.9)
MCH: 27.8 pg (ref 26.6–33.0)
MCHC: 32.9 g/dL (ref 31.5–35.7)
MCV: 85 fL (ref 79–97)
Platelets: 197 10*3/uL (ref 150–450)
RBC: 4.21 x10E6/uL (ref 3.77–5.28)
RDW: 13.2 % (ref 11.7–15.4)
WBC: 3.3 10*3/uL — ABNORMAL LOW (ref 3.4–10.8)

## 2019-05-17 NOTE — ED Provider Notes (Signed)
86 year old female received a signout from [facility] pending repeat troponin. Per his HPI:   "[name] is a 86 y.o. female.  Patient with history of hypertension, high cholesterol, peripheral arterial disease, stroke, PAF on Eliquis-- presents to the emergency department with complaint of left anterior lower chest pain over the lower margin of her ribs.  Patient has what feels like a lipoma in the area which has been there for years.  She describes a pressure sensation like gas in this area.  She states that she has been burping a lot and feels better after she burps.  She reports that the pain is worse with movement and when she coughs or takes a deep breath.  Pain radiates around her side but she denies any pain in her upper back, shoulders or between her shoulder blades.  She has not had any vomiting, diaphoresis, or pain worse with exertion.  No injuries to the area.  She has not really had symptoms like this before.  She is eating and drinking.  She has not tried any medications at home.  No weakness, numbness, or tingling in her arms or her legs. Patient denies signs of stroke including: facial droop, slurred speech, aphasia, weakness/numbness in extremities, imbalance/trouble walking.  She has not taken her blood pressure medications this afternoon or this evening.  She reports recent change in her blood pressure medications and recent for control of blood pressures." Physical Exam  BP (!) 175/69   Pulse (!) 55   Temp 98 F (36.7 C) (Oral)   Resp 16   Ht 5\' 3"  (1.6 m)   Wt 55.3 kg   SpO2 98%   BMI 21.61 kg/m   Physical Exam Vitals and nursing note reviewed.  Constitutional:      Appearance: She is well-developed. She is not ill-appearing or toxic-appearing.     Comments: Sitting up right in bed.  No acute distress.  Pleasant.  HENT:     Head: Normocephalic and atraumatic.  Pulmonary:     Comments: No increased work of breathing.  No accessory muscle use.  Speaks in complete,  fluent sentences without increased work of breathing. Musculoskeletal:        General: Normal range of motion.     Cervical back: Normal range of motion.  Neurological:     Mental Status: She is alert and oriented to person, place, and time.     ED Course/Procedures     Procedures  MDM   86 year old female received at signout from [facility] pending repeat troponin.  Please see his note for further work-up and medical decision making.  Repeat troponin trend is flat.  Doubt ACS.  Doubt PE as patient is already anticoagulated.  Doubt aortic dissection.  Did consider herpes zoster, but no evidence of rash.   Patient's blood pressure is elevated, but she has not taken her nighttime antihypertensive medications.  Shared decision making conversation, the patient opted to take the medication tonight after being discharged as opposed to having a dose here.  All questions answered.  She is hemodynamically stable and in no acute distress.  ER return precautions given.  Safe for discharge to home with outpatient follow-up at this time as needed.      Joanne Gavel, PA-C 05/17/19 0023    Merryl Hacker, MD 05/17/19 [phone]

## 2019-05-17 NOTE — Telephone Encounter (Signed)
Pt states ED advised her to follow up with PCP within 3 days. Next available appt is SDA slot would it be okay to schedule SDA.pt states that they told her she would need a patch. Pt unaware of the name but states insurance will not cover it

## 2019-05-17 NOTE — Telephone Encounter (Signed)
Thanks- looks like she got evaluated

## 2019-05-17 NOTE — ED Notes (Signed)
Pt was discharged from the ED. Pt read and understood discharge paperwork. Pt had vital signs completed. Pt conscious, breathing, and A&Ox4. No distress noted. Pt speaking in complete sentences. Pt brought out of the ED via wheelchair. E-signature not available. 

## 2019-05-17 NOTE — Telephone Encounter (Signed)
Spoke with the patient and she has agreed to see Dr. Yong Channel on 05-24-2019 at 11:20 am.

## 2019-05-17 NOTE — Telephone Encounter (Signed)
Triage notes - 05/16/19 [place] [facility] [place] RECORD [facility] Patient Name: [name] Gender: Female DOB: 1925/02/26 Age: 86 Y 15 M 14 D Return Phone Number: [phone] (Primary) Address: City/State/Zip: [place] [address] Client [facility] Client Site [facility] Day Physician [name]- MD Contact Type Call Who Is Calling Patient / Member / Family / Caregiver Call Type Triage / Clinical Relationship To Patient Self Return Phone Number [phone] (Primary) Chief Complaint CHEST PAIN (>=21 years) - pain, pressure, heaviness or tightness Reason for Call Symptomatic / Request for [name]. She has some pain in back too. [place] ED Translation No Nurse Assessment Nurse: Amanda Pounds, RN, Amy Date/Time [name] Time): 05/16/2019 3:35:39 PM Confirm and document reason for call. If symptomatic, describe symptoms. ---Caller states she is experiencing pain when she inhales & exhales right up under her left bust and feels like she has a fatty tissue right up under the bustline. It feels like gas. She has some pain in back too. It started 2 nights ago. She felt gas. Today it locked in, more constant. It is not sharp. No other symptoms. If she moves or walk a certain way it hurts. Has the patient had close contact with a person known or suspected to have the novel coronavirus illness OR traveled / lives in area with major community spread (including international travel) in the last 14 days from the onset of symptoms? * If Asymptomatic, screen for exposure and travel within the last 14 days. ---No Does the patient have any new or worsening symptoms? ---Yes Will a triage be  completed? ---Yes Related visit to physician within the last 2 weeks? ---No Does the PT have any chronic conditions? (i.e. diabetes, asthma, this includes High risk factors for pregnancy, etc.) ---Yes List chronic conditions. ---A-Fib Is this a behavioral health or substance abuse call? ---NoPLEASE NOTE: All timestamps contained within this report are represented as [place] Standard Time. CONFIDENTIALTY NOTICE: This fax transmission is intended only for the addressee. It contains information that is legally privileged, confidential or otherwise protected from use or disclosure. If you are not the intended recipient, you are strictly prohibited from reviewing, disclosing, copying using or disseminating any of this information or taking any action in reliance on or regarding this information. If you have received this fax in error, please notify us immediately by telephone so that we can arrange for its return to [place]. Phone: [phone], Toll-Free: [phone], Fax: [phone] Page: 2 of 2 Call Id: [id number] Guidelines Guideline Title Affirmed Question Affirmed Notes Nurse Date/Time [name] Time) Chest Pain Hip or leg fracture (broken bone) in past month (or had cast on leg or ankle in past month) Lovelace, RN, Amy 05/16/2019 3:38:35 PM Disp. Time [name] Time) Disposition Final User 05/16/2019 3:33:37 PM Send to Urgent [name], [name] 05/16/2019 3:45:19 PM Go to ED Now Yes Lovelace, RN, Amy Caller Disagree/Comply Comply Caller Understands Yes PreDisposition InappropriateToAsk Care Advice Given Per Guideline GO TO ED NOW: * You need to be seen in the Emergency Department. * Go to the ED at ___________ [facility] now. Drive carefully. ANOTHER ADULT SHOULD DRIVE: * It is better and safer if another adult drives instead of you. BRING MEDICINES: *  Please bring a list of your current medicines when you go to the Emergency Department (ER). * It is also a good idea to bring the pill  bottles too. This will help the doctor to make certain you are taking the right medicines and the right dose. CARE ADVICE given per Chest Pain (Adult) guideline. NOTHING BY MOUTH: Do not eat or drink anything for now. Comments User: Amanda Sever, RN Date/Time [name] Time): 05/16/2019 3:45:53 PM Caller states she is not sure she is going to go to the ER. Strongly encouraged her to go & be evaluated. Referrals GO TO FACILITY OTHER - SPECIFY

## 2019-05-24 ENCOUNTER — Other Ambulatory Visit: Payer: Self-pay

## 2019-05-24 ENCOUNTER — Ambulatory Visit (INDEPENDENT_AMBULATORY_CARE_PROVIDER_SITE_OTHER): Payer: Medicare Other | Admitting: Family Medicine

## 2019-05-24 ENCOUNTER — Encounter: Payer: Self-pay | Admitting: Family Medicine

## 2019-05-24 VITALS — BP 144/60 | HR 54 | Temp 98.6°F | Ht 63.0 in | Wt 123.0 lb

## 2019-05-24 DIAGNOSIS — E785 Hyperlipidemia, unspecified: Secondary | ICD-10-CM | POA: Diagnosis not present

## 2019-05-24 DIAGNOSIS — I10 Essential (primary) hypertension: Secondary | ICD-10-CM

## 2019-05-24 DIAGNOSIS — R0789 Other chest pain: Secondary | ICD-10-CM

## 2019-05-24 DIAGNOSIS — R140 Abdominal distension (gaseous): Secondary | ICD-10-CM

## 2019-05-24 MED ORDER — CHLORTHALIDONE 25 MG PO TABS: 25.0000 mg | ORAL_TABLET | Freq: Every day | ORAL | 3 refills | Status: DC

## 2019-05-24 MED ORDER — LIDOCAINE 5 % EX PTCH: 1.0000 | MEDICATED_PATCH | CUTANEOUS | 0 refills | Status: DC

## 2019-05-24 NOTE — Progress Notes (Signed)
Phone [phone] In person visit   Subjective:   [name] is a 86 y.o. year old very pleasant female patient who presents for/with See problem oriented charting Chief Complaint  Patient presents with  . Back Pain    x 2 days    This visit occurred during the SARS-CoV-2 public health emergency.  Safety protocols were in place, including screening questions prior to the visit, additional usage of staff PPE, and extensive cleaning of exam room while observing appropriate contact time as indicated for disinfecting solutions.   Past Medical History-  Patient Active Problem List   Diagnosis Date Noted  . Chronic cough 08/12/2015    Priority: High  . PAD (peripheral artery disease) ([facility]) 05/10/2013    Priority: High  . PAF (paroxysmal atrial fibrillation) ([facility])     Priority: High  . History of stroke     Priority: High  . Hyperglycemia 06/25/2014    Priority: Medium  . Hypothyroidism 06/07/2014    Priority: Medium  . Hyperlipidemia 06/07/2014    Priority: Medium  . Hypertension     Priority: Medium  . GERD (gastroesophageal reflux disease) 06/25/2014    Priority: Low  . Vitamin D deficiency 06/25/2014    Priority: Low  . Intracervical pessary 06/07/2014    Priority: Low  . Sinus bradycardia 05/19/2013    Priority: Low  . PAC (premature atrial contraction) 05/20/2011    Priority: Low  . CKD (chronic kidney disease), stage III 06/15/2018  . Low vitamin B12 level 12/09/2017  . Anemia 08/16/2017  . Perennial allergic rhinitis 08/12/2015    Medications- reviewed and updated Current Outpatient Medications  Medication Sig Dispense Refill  . apixaban (ELIQUIS) 2.5 MG TABS tablet Take 1 tablet (2.5 mg total) by mouth 2 (two) times daily. 60 tablet 6  . ARMOUR THYROID 15 MG tablet TAKE 1 TABLET BY MOUTH EVERY DAY (Patient taking differently: Take 15 mg by mouth daily before breakfast. ) 90 tablet 1  . Ascorbic Acid (VITAMIN C) 500 MG/5ML LIQD Take 1,500 mg by mouth  daily.    . chlorthalidone (HYGROTON) 25 MG tablet Take 1 tablet (25 mg total) by mouth daily. 90 tablet 3  . COSOPT 22.3-6.8 MG/ML ophthalmic solution Place 1 drop into both eyes 2 (two) times daily. Reported on 06/21/2015  0  . diltiazem (CARDIZEM CD) 120 MG 24 hr capsule Take 1 capsule (120 mg total) by mouth daily. 90 capsule 3  . hydrALAZINE (APRESOLINE) 25 MG tablet Take 1 tablet (25 mg total) by mouth 3 (three) times daily. 270 tablet 3  . irbesartan (AVAPRO) 300 MG tablet Take 1 tablet (300 mg total) by mouth daily. (Patient taking differently: Take 150 mg by mouth in the morning and at bedtime. ) 90 tablet 3  . lidocaine (LIDODERM) 5 % Place 1 patch onto the skin daily. Remove & Discard patch within 12 hours or as directed by MD 30 patch 0  . Multiple Vitamins-Minerals (ALIVE MULTI-VITAMIN) LIQD Take 15 mLs by mouth daily.    . NON FORMULARY Take 1 Can by mouth See admin instructions. D[employer] [place] Organic Nutrition Meal Replacement Drink or Protein Shake (Made Without Gluten, Soy, Dairy, or Corn)- Drink 1 can by mouth once daily    . rosuvastatin (CRESTOR) 5 MG tablet Take 1 tablet (5 mg total) by mouth once a week. (Patient taking differently: Take 5 mg by mouth every Saturday. ) 13 tablet 3   No current facility-administered medications for this visit.  Objective:  BP (!) 144/60   Pulse (!) 54   Temp 98.6 F (37 C) (Temporal)   Ht 5\' 3"  (1.6 m)   Wt 123 lb (55.8 kg)   SpO2 96%   BMI 21.79 kg/m  Gen: NAD, resting comfortably CV: Slightly bradycardic but regular no murmurs rubs or gallops Lungs: CTAB no crackles, wheeze, rhonchi Abdomen: soft/nontender/nondistended at present/normal bowel sounds. No rebound or guarding.  Ext: no edema Skin: warm, dry MSK: Left chest wall tenderness along left lower rib.  Paraspinous muscle tenderness without midline tenderness and low back    Assessment and Plan   # Chest pain S:Patient was seen in the emergency room on May 16, 2019-she  complained of left anterior lower chest pain over the lower margin of ribs.  The emergency room provider noted a lipoma in that area but she stated it has been there for years.  Patient felt like she had a gaseous distention/pressure beneath area as wekk.  She also had noticed that she was burping a lot- burping actually helped relieve the pain.  Pain was worse with coughing or deep breaths.  Gets some radiation around her side into her upper back and shoulders and in between shoulder blades-declined having symptoms like that previously.  At time of visit had not taken her blood pressure medications and blood pressure was elevated at 175/69  EKG with nonspecific T wave change and right bundle branch block.  EKG was unchanged from January 2021 EKG per report.   Troponins were cycled and trend was flat at 21 and later 27.  Pulmonary embolism was thought not likely as she was on anticoagulation.  They doubted aortic dissection.  Consideration for herpes zoster but no rash was noted.  Pain was reproducible and was thought to be musculoskeletal at least in part.  Of note patient recently had cardiology follow-up on March 2 for atrial fibrillation due to recurrent episode March 24, 2019. She was not having chest pain at that time.   The blood pressure was elevated through shared decision making patient was discharged home and she will plan to take her medicines at home and follow-up with [place] on outpatient basis.  When patient saw cardiology hydralazine was added back in.  Patient was already on diltiazem.  Patient feels like her problem is primarily gas related-She is having increased gas and thinks that may be problem. Would like referral to GI potentially. She feels like she gets gas day and night and locks in upper quadrant on the left- belching does seem to help.   Now having some cough starting 2 weeks go and causing lower back pain when she coughs. Some incontinence while coughing but otherwise no  issues A/P:  86 year old female with reproducible left chest wall pain as well as gaseous distension in upper abdomen intermittently (not at present).  - I do suspect chest wall pain is musculoskeletal-she would like to try the lidocaine patches that were previously ordered but she states she was told had to be from her primary care doctor not for emergency physician otherwise was very expensive-I reordered Lidocaine patches today. -I told patient to be on the safe side we can have her follow-up with cardiology but she declines for now.  Fortunately does not have exertional element to her pain or other symptoms such as shortness of breath or left arm or neck pain -For gaseous distention patient states she has had worsened issues since she switched back to her more traditional diet-had  been on a more aggressive diet and was losing weight and blood pressure was much better but she simply does not think it was sustainable-discussed could work with her dietitian on elimination type diet to see if there are particular foods bothering her-could be in FODMAP classifications-she declines for now -Ultimately patient is going to try simethicone for 2 to 3 weeks and if no improvement will let me know and I will refer her to gastroenterology -I agree with the ED provider that likelihood of pulmonary embolism is low considering she is on Eliquis  #Low back pain  S:low back pain started about two days after coughing as above.  A/P: No midline back pain-has paraspinous muscle tenderness on exam.  Range of motion does not appear affected.  No obvious red flags-she reports worse with coughing-we will simply monitor for now  #hypertension S: compliant with  chlorthalidone 25mg , hydralazine 25mg  3x a day, irbesartan 150mg  twice a day, diltiazem 120mg  XR BP Readings from Last 3 Encounters:  05/24/19 (!) 144/60  05/17/19 (!) 168/72  04/25/19 (!) 150/62  A/P: Blood pressure is slightly high today considering she has  peripheral arterial disease would prefer less than 140/90-with that being said at her age and with difficulty controlling blood pressure in the past I believe tolerating up to 145-150 is reasonable-I would be concerned about pushing diastolic readings lower as well  #hyperlipidemia with known PAD S: compliant with  rosuvasatin 5 mg once a week Lab Results  Component Value Date   CHOL 199 07/28/2018   HDL 53.10 07/28/2018   LDLCALC 124 (H) 07/28/2018   TRIG 110.0 07/28/2018   CHOLHDL 4 07/28/2018   A/P: Last cholesterol numbers were elevated-hoping rosuvastatin 5 mg once weekly helpful-patient has not been able to tolerate higher statins.  We will recheck in June or later-perhaps could try 10 mg once a week  Recommended follow up: Patient originally had visit scheduled within a few weeks-we are going to push that out approximately 2 months to see how she does with gaseous distention-she is welcome to see [place] back sooner if new or worsening symptoms and she is well aware of this Future Appointments  Date Time Provider [place]  06/20/2019  1:00 PM [name], MD [facility]  07/31/2019 10:00 AM Burnell Blanks, MD [facility]    Lab/Order associations:   ICD-10-CM   1. Musculoskeletal chest pain  R07.89   2. Gaseous abdominal distention  R14.0   3. Essential hypertension  I10   4. Hyperlipidemia, unspecified hyperlipidemia type  E78.5     Meds ordered this encounter  Medications  . chlorthalidone (HYGROTON) 25 MG tablet    Sig: Take 1 tablet (25 mg total) by mouth daily.    Dispense:  90 tablet    Refill:  3  . lidocaine (LIDODERM) 5 %    Sig: Place 1 patch onto the skin daily. Remove & Discard patch within 12 hours or as directed by MD    Dispense:  30 patch    Refill:  0    Time Spent: 38 minutes of total time (11:25 PM- 11:55 AM. 4:06 PM-4:14 PM) was spent on the date of the encounter performing the following actions: chart review prior to  seeing the patient, obtaining history, performing a medically necessary exam, counseling on the treatment plan, placing orders, and documenting in our EHR.   Return precautions advised.  Garret Reddish, MD

## 2019-05-24 NOTE — Patient Instructions (Addendum)
Health Maintenance Due  Topic Date Due  . TETANUS/TDAP has not received  Never done   Try simethicone/Gas-x over the counter to see if that helps with gas/belching- try this for 1-2 weeks and if not improving let me know and I will refer you to [facility] docs/gastroenterology  You can try lidocaine patch for chest- I really think the gas x may help more  You can also try lidocaine on the back if needed.  I want you to ice your low back 3x a day for 20 minutes. After 3 days I want you to try heat 3x a day for 20 minutes.   If you have new or worsening symptoms let us know ASAP or seek care immediately- particularly if chest pain happens when you exert yourself like going up stairs or walking quickly   Recommended follow up: cancel April 6th visit and lets schedule perhaps 1 month out to check on gas issues- I am always willing to place referral before visit if things arent getting better

## 2019-05-30 ENCOUNTER — Ambulatory Visit: Payer: Medicare Other | Admitting: Family Medicine

## 2019-06-20 ENCOUNTER — Encounter: Payer: Self-pay | Admitting: Family Medicine

## 2019-06-20 ENCOUNTER — Other Ambulatory Visit: Payer: Self-pay

## 2019-06-20 ENCOUNTER — Ambulatory Visit (INDEPENDENT_AMBULATORY_CARE_PROVIDER_SITE_OTHER): Payer: Medicare Other | Admitting: Family Medicine

## 2019-06-20 VITALS — BP 142/68 | HR 49 | Temp 98.3°F | Ht 63.0 in | Wt 124.0 lb

## 2019-06-20 DIAGNOSIS — D649 Anemia, unspecified: Secondary | ICD-10-CM | POA: Diagnosis not present

## 2019-06-20 DIAGNOSIS — E785 Hyperlipidemia, unspecified: Secondary | ICD-10-CM | POA: Diagnosis not present

## 2019-06-20 DIAGNOSIS — Z23 Encounter for immunization: Secondary | ICD-10-CM

## 2019-06-20 DIAGNOSIS — R609 Edema, unspecified: Secondary | ICD-10-CM | POA: Diagnosis not present

## 2019-06-20 DIAGNOSIS — W19XXXA Unspecified fall, initial encounter: Secondary | ICD-10-CM

## 2019-06-20 DIAGNOSIS — I10 Essential (primary) hypertension: Secondary | ICD-10-CM | POA: Diagnosis not present

## 2019-06-20 DIAGNOSIS — T148XXA Other injury of unspecified body region, initial encounter: Secondary | ICD-10-CM

## 2019-06-20 DIAGNOSIS — R5383 Other fatigue: Secondary | ICD-10-CM

## 2019-06-20 DIAGNOSIS — S0101XA Laceration without foreign body of scalp, initial encounter: Secondary | ICD-10-CM

## 2019-06-20 LAB — POC URINALSYSI DIPSTICK (AUTOMATED)
Bilirubin, UA: NEGATIVE
Blood, UA: NEGATIVE
Glucose, UA: NEGATIVE
Ketones, UA: NEGATIVE
Nitrite, UA: NEGATIVE
Protein, UA: NEGATIVE
Spec Grav, UA: 1.015 (ref 1.010–1.025)
Urobilinogen, UA: 0.2 E.U./dL
pH, UA: 6 (ref 5.0–8.0)

## 2019-06-20 LAB — CBC WITH DIFFERENTIAL/PLATELET
Basophils Absolute: 0 10*3/uL (ref 0.0–0.1)
Basophils Relative: 0.4 % (ref 0.0–3.0)
Eosinophils Absolute: 0 10*3/uL (ref 0.0–0.7)
Eosinophils Relative: 1.6 % (ref 0.0–5.0)
HCT: 32 % — ABNORMAL LOW (ref 36.0–46.0)
Hemoglobin: 10.6 g/dL — ABNORMAL LOW (ref 12.0–15.0)
Lymphocytes Relative: 22.1 % (ref 12.0–46.0)
Lymphs Abs: 0.7 10*3/uL (ref 0.7–4.0)
MCHC: 33 g/dL (ref 30.0–36.0)
MCV: 84.5 fl (ref 78.0–100.0)
Monocytes Absolute: 0.5 10*3/uL (ref 0.1–1.0)
Monocytes Relative: 15.9 % — ABNORMAL HIGH (ref 3.0–12.0)
Neutro Abs: 1.8 10*3/uL (ref 1.4–7.7)
Neutrophils Relative %: 60 % (ref 43.0–77.0)
Platelets: 172 10*3/uL (ref 150.0–400.0)
RBC: 3.79 Mil/uL — ABNORMAL LOW (ref 3.87–5.11)
RDW: 14.6 % (ref 11.5–15.5)
WBC: 3.1 10*3/uL — ABNORMAL LOW (ref 4.0–10.5)

## 2019-06-20 LAB — TSH: TSH: 4.18 u[IU]/mL (ref 0.35–4.50)

## 2019-06-20 LAB — COMPREHENSIVE METABOLIC PANEL
ALT: 13 U/L (ref 0–35)
AST: 15 U/L (ref 0–37)
Albumin: 4.3 g/dL (ref 3.5–5.2)
Alkaline Phosphatase: 65 U/L (ref 39–117)
BUN: 40 mg/dL — ABNORMAL HIGH (ref 6–23)
CO2: 29 mEq/L (ref 19–32)
Calcium: 9.1 mg/dL (ref 8.4–10.5)
Chloride: 100 mEq/L (ref 96–112)
Creatinine, Ser: 1.36 mg/dL — ABNORMAL HIGH (ref 0.40–1.20)
GFR: 44.05 mL/min — ABNORMAL LOW (ref >60.00)
Glucose, Bld: 92 mg/dL (ref 70–99)
Potassium: 3.6 mEq/L (ref 3.5–5.1)
Sodium: 136 mEq/L (ref 135–145)
Total Bilirubin: 0.4 mg/dL (ref 0.2–1.2)
Total Protein: 6.9 g/dL (ref 6.0–8.3)

## 2019-06-20 NOTE — Progress Notes (Signed)
Phone [phone] In person visit   Subjective:   [name] is a 86 y.o. year old very pleasant female patient who presents for/with See problem oriented charting Chief Complaint  Patient presents with  . Hypertension  . Fall   I,Joellen Y Thompson,acting as a Education administrator for Garret Reddish, MD.,have documented all relevant documentation on the behalf of Garret Reddish, MD,as directed by  Garret Reddish, MD while in the presence of Garret Reddish, MD.  This visit occurred during the SARS-CoV-2 public health emergency.  Safety protocols were in place, including screening questions prior to the visit, additional usage of staff PPE, and extensive cleaning of exam room while observing appropriate contact time as indicated for disinfecting solutions.   Past Medical History-  Patient Active Problem List   Diagnosis Date Noted  . Chronic cough 08/12/2015    Priority: High  . PAD (peripheral artery disease) ([place]) 05/10/2013    Priority: High  . PAF (paroxysmal atrial fibrillation) ([facility])     Priority: High  . History of stroke     Priority: High  . Hyperglycemia 06/25/2014    Priority: Medium  . Hypothyroidism 06/07/2014    Priority: Medium  . Hyperlipidemia 06/07/2014    Priority: Medium  . Hypertension     Priority: Medium  . GERD (gastroesophageal reflux disease) 06/25/2014    Priority: Low  . Vitamin D deficiency 06/25/2014    Priority: Low  . Intracervical pessary 06/07/2014    Priority: Low  . Sinus bradycardia 05/19/2013    Priority: Low  . PAC (premature atrial contraction) 05/20/2011    Priority: Low  . CKD (chronic kidney disease), stage III 06/15/2018  . Low vitamin B12 level 12/09/2017  . Anemia 08/16/2017  . Perennial allergic rhinitis 08/12/2015    Medications- reviewed and updated Current Outpatient Medications  Medication Sig Dispense Refill  . apixaban (ELIQUIS) 2.5 MG TABS tablet Take 1 tablet (2.5 mg total) by mouth 2 (two) times daily. 60 tablet 6    . ARMOUR THYROID 15 MG tablet TAKE 1 TABLET BY MOUTH EVERY DAY (Patient taking differently: Take 15 mg by mouth daily before breakfast. ) 90 tablet 1  . Ascorbic Acid (VITAMIN C) 500 MG/5ML LIQD Take 1,500 mg by mouth daily.    . chlorthalidone (HYGROTON) 25 MG tablet Take 1 tablet (25 mg total) by mouth daily. 90 tablet 3  . COSOPT 22.3-6.8 MG/ML ophthalmic solution Place 1 drop into both eyes 2 (two) times daily. Reported on 06/21/2015  0  . diltiazem (CARDIZEM CD) 120 MG 24 hr capsule Take 1 capsule (120 mg total) by mouth daily. 90 capsule 3  . hydrALAZINE (APRESOLINE) 25 MG tablet Take 1 tablet (25 mg total) by mouth 3 (three) times daily. 270 tablet 3  . irbesartan (AVAPRO) 300 MG tablet Take 1 tablet (300 mg total) by mouth daily. (Patient taking differently: Take 150 mg by mouth in the morning and at bedtime. ) 90 tablet 3  . lidocaine (LIDODERM) 5 % Place 1 patch onto the skin daily. Remove & Discard patch within 12 hours or as directed by MD 30 patch 0  . Multiple Vitamins-Minerals (ALIVE MULTI-VITAMIN) LIQD Take 15 mLs by mouth daily.    . NON FORMULARY Take 1 Can by mouth See admin instructions. Dillard Essex Organic Nutrition Meal Replacement Drink or Protein Shake (Made Without Gluten, Soy, Dairy, or Corn)- Drink 1 can by mouth once daily    . rosuvastatin (CRESTOR) 5 MG tablet Take 1 tablet (5 mg  total) by mouth once a week. (Patient taking differently: Take 5 mg by mouth every Saturday. ) 13 tablet 3   No current facility-administered medications for this visit.     Objective:  BP (!) 142/68   Pulse (!) 49   Temp 98.3 F (36.8 C) (Temporal)   Ht 5\' 3"  (1.6 m)   Wt 124 lb (56.2 kg)   SpO2 95%   BMI 21.97 kg/m  Gen: NAD, resting comfortably CV: RRR mildly bradycardic on exam Lungs: CTAB no crackles, wheeze, rhonchi Abdomen: soft/nontender/nondistended/normal bowel sounds. No rebound or guarding.  Ext: b/l ankle edema Skin: warm, dry, healing abrasion on right  forehead Neuro: grossly normal, moves all extremities    Assessment and Plan   # Fall S:one week ago patient had fall in yard. She did have cut on head. She is not sure what she hit her head on.  Denies loss of consciousness, head ache, nausea or vomiting. All symptoms and pain resolved. She was not evaluated after fall. Fall history up dated. Denies any issues with word finding, blurred vision. Weakness of arms or legs.   Ros - No facial or extremity weakness. No slurred words or trouble swallowing. no blurry vision or double vision. No paresthesias. No confusion or word finding difficulties.   A/P: 86 year old female with a fall approximately a week ago.  She is on Eliquis which increases her risk of intracranial bleed.  Fortunately she has no symptoms at this time to suggest stroke or brain bleed-she agrees to let us know if she develops any symptoms and we would need to order emergent imaging or she should seek care in the emergency room.  She usually uses a cane with walking-I encouraged her if she was going to be out in her yard would strongly encourage using a cane to help reduce the risk of falls   if she starts having any headaches or neurological symptoms will call and we will get head CT.   Due to abrasion on her right forehead with fall-updated Td  # Fatigue  S:.She reports chronic fatigue which she describes as feeling sleepy. It began a few months ago and occurs all the time. It is described as moderate and staying constant. She has not started new medications around the time the fatigue started. Patient states that if she sets more than a few minutes she will just fall asleep. She sleeps for about 8 hours a night and only gets up once to use the bathroom and falls back asleep with no issues. Has had sleep study about 10 years ago- That was negative.  No significant weight gain since last sleep study A/P: Fatigue/daytime sleepiness.  Will check cbc, cmp and tsh today. May look at  updating sleep study if all labs normal though she does not report any obvious snoring-we may need to get husband's opinion on this. -In regards to labs she did have some anemia and leukopenia-see result note from today for plan related to this -Patient previously has declined hematology consult-may also need to push for this with ongoing fatigue  #hypertension S:compliant with  chlorthalidone 25mg , hydralazine 25mg  3x a day, irbesartan 150mg  twice a day, diltiazem 120mg  XR Home readings #s:  if she takes all medications readings stay in the 140's but if she misses any medications can get in the 160's. She has only missed once from last appointments. Patient denies any chest pain, shortness of breath, changes or changes in vision. Does not add salt to food.  Tries to maintain a heart healthy diet. She does have some Bilateral ankle swelling.   BP Readings from Last 3 Encounters:  06/20/19 (!) 142/68  05/24/19 (!) 144/60  05/17/19 (!) 168/72  A/P: Excellent control when compliant-will continue mediations. Will check urine today in relation to her edema to rule out proteinuria.   #hyperlipidemia S: Medication: Compliant with rosuvastatin 5 mg weekly.  Lab Results  Component Value Date   CHOL 199 07/28/2018   HDL 53.10 07/28/2018   LDLCALC 124 (H) 07/28/2018   TRIG 110.0 07/28/2018   CHOLHDL 4 07/28/2018   A/P: Suspect poor control-has been missing several doses of rosuvastatin.  Ideally would be on higher dose and with LDL under 70 with history of PAD and stroke history but she is hesitant with statins-likely recheck next visit.  We will see if we can possibly increase to 10 mg at that time once a week but I strongly suspect she will be against this  Recommended follow up: Return in about 3 months (around 09/19/2019) for HTN fatigue .  Or sooner if needed Future Appointments  Date Time Provider [place]  07/31/2019 10:00 AM Burnell Blanks, MD [facility]   09/26/2019  1:40 PM Yong Channel Brayton Mars, MD [facility]    Lab/Order associations:   ICD-10-CM   1. Essential hypertension  I10   2. Hyperlipidemia, unspecified hyperlipidemia type  E78.5   3. Fatigue, unspecified type  R53.83 CBC with Differential/Platelet    Comprehensive metabolic panel    TSH  4. Abrasion  T14.8XXA   5. Edema, unspecified type  R60.9 POCT Urinalysis Dipstick (Automated)  6. Anemia, unspecified type  D64.9   7. Laceration of scalp, initial encounter  S01.01XA Td : Tetanus/diphtheria >7yo Preservative  free  8. Fall, initial encounter  W19.Merril Abbe    The entirety of the information documented in the History of Present Illness, Review of Systems and Physical Exam were personally obtained by me. Portions of this information were initially documented by the CMA and reviewed by me for thoroughness and accuracy.   Garret Reddish, MD  Return precautions advised.  Garret Reddish, MD

## 2019-06-20 NOTE — Progress Notes (Deleted)
duplicate

## 2019-06-20 NOTE — Patient Instructions (Addendum)
Health Maintenance Due  Topic Date Due  . COVID-19 Vaccine (1) not sure if she would like to take  Never done  . TETANUS/TDAP will get TD in office today   Never done   Please stop by lab before you go If you have mychart- we will send your results within 3 business days of [place] receiving them.  If you do not have mychart- we will call you about results within 5 business days of [place] receiving them.    Let our office know if you start having any headaches, blurred vision or changes in symptoms give our office a call.   Make sure you are using your cain when walking for stability.   Start B-12 over the counter between 270mcg and 548mcg daily.   Recommended follow up: No follow-ups on file.

## 2019-06-21 ENCOUNTER — Telehealth: Payer: Self-pay | Admitting: Family Medicine

## 2019-06-21 ENCOUNTER — Other Ambulatory Visit: Payer: Self-pay

## 2019-06-21 ENCOUNTER — Ambulatory Visit (HOSPITAL_COMMUNITY): Payer: Medicare Other

## 2019-06-21 DIAGNOSIS — G44319 Acute post-traumatic headache, not intractable: Secondary | ICD-10-CM

## 2019-06-21 NOTE — Addendum Note (Signed)
Addended by: Francella Solian on: 06/21/2019 02:05 PM   Modules accepted: Orders

## 2019-06-21 NOTE — Telephone Encounter (Signed)
Left message on both numbers for her to call me back.  Please find me if she calls back - please do not take a message - if I am unavailable, transfer to [place]. Very very important that we speak to her.

## 2019-06-21 NOTE — Telephone Encounter (Signed)
Received a call from Letha who states that she thinks she is having a reaction to the TP shot she received and sent patient to [facility] to be triaged and she was told to go to ED but is refusing. Patient would like to speak with Dr.Hunter or his nurse.

## 2019-06-21 NOTE — Telephone Encounter (Signed)
Left message to return call to our office.  

## 2019-06-21 NOTE — Telephone Encounter (Signed)
Spoke with the patient and she stated that she started to have a headache on the left side of her head once she got home from receiving her tetanus shot. She denies having any fever or chills. Injection site looks is fine. A little sore. No other concerns at this time. Please advise.

## 2019-06-21 NOTE — Telephone Encounter (Signed)
Patient had recent fall on blood thinner.  I am not so much worried about the tetanus shot but I am worried about the fact that she could have a delayed brain bleed-I have ordered a stat CT scan-would love to have this completed this afternoon if possible

## 2019-06-22 ENCOUNTER — Telehealth: Payer: Self-pay | Admitting: Family Medicine

## 2019-06-22 NOTE — Telephone Encounter (Signed)
Spoke with patient this morning - explained why Dr Yong Channel ordered the CT.  She was unsure when she could go - she was talking about next week.  I explained the importance of the CT and why we are doing it - she said she will call and maybe she can go tomorrow.  I spoke with Dr Yong Channel and Rudi Heap will call her today, to again, explain why the CT needs to be done ASAP.  Pt has the phone number for scheduling - she said she will call once she looks at her schedule.

## 2019-06-22 NOTE — Telephone Encounter (Signed)
Spoke with patient she has been scheduled for CT Scan

## 2019-06-27 ENCOUNTER — Ambulatory Visit (HOSPITAL_COMMUNITY)
Admission: RE | Admit: 2019-06-27 | Discharge: 2019-06-27 | Disposition: A | Discharge status: Home / Self Care | Payer: Medicare Other | Source: Ambulatory Visit | Attending: Family Medicine | Admitting: Family Medicine

## 2019-06-27 ENCOUNTER — Other Ambulatory Visit: Payer: Self-pay

## 2019-06-27 DIAGNOSIS — G44319 Acute post-traumatic headache, not intractable: Secondary | ICD-10-CM | POA: Insufficient documentation

## 2019-06-27 DIAGNOSIS — R519 Headache, unspecified: Secondary | ICD-10-CM | POA: Diagnosis not present

## 2019-06-27 DIAGNOSIS — H401132 Primary open-angle glaucoma, bilateral, moderate stage: Secondary | ICD-10-CM | POA: Diagnosis not present

## 2019-06-27 DIAGNOSIS — S0990XA Unspecified injury of head, initial encounter: Secondary | ICD-10-CM | POA: Diagnosis not present

## 2019-07-25 DIAGNOSIS — H16143 Punctate keratitis, bilateral: Secondary | ICD-10-CM | POA: Diagnosis not present

## 2019-07-31 ENCOUNTER — Encounter: Payer: Self-pay | Admitting: Cardiovascular Disease

## 2019-07-31 ENCOUNTER — Other Ambulatory Visit: Payer: Self-pay

## 2019-07-31 ENCOUNTER — Ambulatory Visit (INDEPENDENT_AMBULATORY_CARE_PROVIDER_SITE_OTHER): Payer: Medicare Other | Admitting: Cardiovascular Disease

## 2019-07-31 VITALS — BP 130/74 | HR 53 | Ht 63.0 in | Wt 120.0 lb

## 2019-07-31 DIAGNOSIS — I480 Paroxysmal atrial fibrillation: Secondary | ICD-10-CM

## 2019-07-31 DIAGNOSIS — I10 Essential (primary) hypertension: Secondary | ICD-10-CM

## 2019-07-31 DIAGNOSIS — I739 Peripheral vascular disease, unspecified: Secondary | ICD-10-CM

## 2019-07-31 NOTE — Progress Notes (Signed)
Chief Complaint  Patient presents with  . Follow-up    PAF   History of Present Illness: 86 yo female with history of PVCs, PACs, HTN, CVA, PAF s/p MAZE 2006, PAD who is here today for cardiac follow up. I have followed her for atrial fibrillation and HTN. Her HTN has been difficult to control over the years. She has not tolerated many medications. She has most recently tolerated Avapro, chlorthalidone and hydralazine well. She has not tolerated Norvasc, Coreg, Bystolic or clonidine. I met her at [facility] in February 2013 when she was admitted with a headache and found to have sinus brady with elevated BP. She had initially been tried on Bystolic but did not tolerate due to bradycardia. She was started on Norvasc in the hospital but had burning in her legs so she stopped it. Echo 04/02/11 with normal LV size and function. Event monitor in 2013 showed NSR with PACs. No evidence of atrial fib. Renal artery dopplers November 2013 without evidence of renal artery stenosis. She has PAD followed by Dr. Gwenlyn Found and underwent successful balloon/stenting of an occluded right popliteal artery per Dr. Gwenlyn Found in 2015. Echo June 2015 at [facility] with normal LV function. Cardiac monitor October 2017 with PVCs and PACs, no atrial fib. She was seen in the ED at [facility] 03/24/19 with tachycardia. Per report, EMS strips showed atrial fibrillation with RVR. She converted to sinus prior to arrival to the ED. I saw her in the office February 2021 and started Eliquis and Cardizem.   She is here today for follow up. The patient denies any chest pain, dyspnea, palpitations, lower extremity edema, orthopnea, PND, dizziness, near syncope or syncope.   Primary Care Physician:  [name], MD  Past Medical History:  Diagnosis Date  . Arthritis   . Chicken pox   . Glaucoma of both eyes   . High cholesterol   . Hypertension    a. Normal renal arteries by duplex 2013.  [name] Hypothyroidism   . Migraine   . PAD  (peripheral artery disease) ([place])    a. 04/2013: s/p PCI to occluded right popliteal artery   . PAF (paroxysmal atrial fibrillation) ([place])    a. s/p MAZE 2006. b. Event monitor 2013: NSR with PACs.  . Sinus bradycardia    a. 03/2011 during hospital admission - beta blocker stopped.  . Stroke [facility])    a. Pt reports 3-4 prior strokes without residual sx. b. CVA 2013 (presented with headache) - started on Plavix. Event monitor as outpt - no AF.    Past Surgical History:  Procedure Laterality Date  . CATARACT EXTRACTION Left 11/09/2017  . LOWER EXTREMITY ANGIOGRAM N/A 05/15/2013   Procedure: LOWER EXTREMITY ANGIOGRAM;  Surgeon: Lorretta Harp, MD;  Location: [facility] CATH LAB;  Service: Cardiovascular;  Laterality: N/A;  . MAZE  ~ 2006  . POPLITEAL ARTERY STENT  05/15/2013    Current Outpatient Medications  Medication Sig Dispense Refill  . apixaban (ELIQUIS) 2.5 MG TABS tablet Take 1 tablet (2.5 mg total) by mouth 2 (two) times daily. 60 tablet 6  . ARMOUR THYROID 15 MG tablet TAKE 1 TABLET BY MOUTH EVERY DAY (Patient taking differently: Take 15 mg by mouth daily before breakfast. ) 90 tablet 1  . Ascorbic Acid (VITAMIN C) 500 MG/5ML LIQD Take 1,500 mg by mouth daily.    . chlorthalidone (HYGROTON) 25 MG tablet Take 1 tablet (25 mg total) by mouth daily. 90 tablet 3  . COSOPT  22.3-6.8 MG/ML ophthalmic solution Place 1 drop into both eyes 2 (two) times daily. Reported on 06/21/2015  0  . diltiazem (CARDIZEM CD) 120 MG 24 hr capsule Take 1 capsule (120 mg total) by mouth daily. 90 capsule 3  . hydrALAZINE (APRESOLINE) 25 MG tablet Take 1 tablet (25 mg total) by mouth 3 (three) times daily. 270 tablet 3  . irbesartan (AVAPRO) 300 MG tablet Take 1 tablet (300 mg total) by mouth daily. (Patient taking differently: Take 150 mg by mouth in the morning and at bedtime. ) 90 tablet 3  . Multiple Vitamins-Minerals (ALIVE MULTI-VITAMIN) LIQD Take 15 mLs by mouth daily.    . NON FORMULARY Take 1 Can by mouth  See admin instructions. Dillard Essex Organic Nutrition Meal Replacement Drink or Protein Shake (Made Without Gluten, Soy, Dairy, or Corn)- Drink 1 can by mouth once daily    . rosuvastatin (CRESTOR) 5 MG tablet Take 1 tablet (5 mg total) by mouth once a week. (Patient taking differently: Take 5 mg by mouth every Saturday. ) 13 tablet 3   No current facility-administered medications for this visit.    Allergies  Allergen Reactions  . Aceon [Perindopril Erbumine] Cough  . Amlodipine Other (See Comments)    Lower extremity discomfort  . Aspirin Other (See Comments) and Cough    STOPPED BY A MEDICAL PROFESSIONAL  . Azor [Amlodipine-Olmesartan] Cough  . Beta Adrenergic Blockers Other (See Comments)    Bradycardia  . Hydralazine Nausea And Vomiting and Other (See Comments)    Weakness- tolerating in 2021   . Milk-Related Compounds Other (See Comments) and Cough    STOPPED BY A MEDICAL PROFESSIONAL  . Other Other (See Comments) and Cough    NUTS- ALL TYPES- (STOPPED BY A MEDICAL PROFESSIONAL)  . Peanut-Containing Drug Products Other (See Comments) and Cough    STOPPED BY A MEDICAL PROFESSIONAL  . Sulfamethoxazole-Trimethoprim Other (See Comments)    Patient cannot specifically recall the reaction  . Vitamin D Analogs Cough    Social History   Socioeconomic History  . Marital status: Married    Spouse name: Not on file  . Number of children: Not on file  . Years of education: Not on file  . Highest education level: Not on file  Occupational History  . Not on file  Tobacco Use  . Smoking status: Never Smoker  . Smokeless tobacco: Never Used  Substance and Sexual Activity  . Alcohol use: No    Alcohol/week: 0.0 standard drinks  . Drug use: No  . Sexual activity: Never  Other Topics Concern  . Not on file  Social History Narrative   Married. Lives with husband. Married 65 years in 2016. [place] 22 years in 2016. 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  6 greatgrandkids.    Finished 10th grade.       Retired from working in [employer] for [employer]         Social Determinants of Radio broadcast assistant Strain:   . Difficulty of Paying Living Expenses:   Food Insecurity:   . Worried About Charity fundraiser in the Last Year:   . Arboriculturist in the Last Year:   Transportation Needs:   . Film/video editor (Medical):   [name] Lack of Transportation (Non-Medical):   Physical Activity:   . Days of Exercise per Week:   . Minutes of Exercise per Session:   Stress:   . Feeling  of Stress :   Social Connections:   . Frequency of Communication with Friends and Family:   . Frequency of Social Gatherings with Friends and Family:   . Attends Religious Services:   . Active Member of Clubs or Organizations:   . Attends Archivist Meetings:   [name] Marital Status:   Intimate Partner Violence:   . Fear of Current or Ex-Partner:   . Emotionally Abused:   [name] Physically Abused:   . Sexually Abused:     Family History  Problem Relation Age of Onset  . Hypertension Mother   . Ulcers Father   . Cervical cancer Sister   . Throat cancer Brother   . Lung cancer Brother   . Cancer Brother   . Allergic rhinitis Neg Hx   . Angioedema Neg Hx   . Asthma Neg Hx   . Atopy Neg Hx   . Eczema Neg Hx   . Immunodeficiency Neg Hx   . Urticaria Neg Hx     Review of Systems:  As stated in the HPI and otherwise negative.   BP 130/74   Pulse (!) 53   Ht '5\' 3"'  (1.6 m)   Wt 120 lb (54.4 kg)   SpO2 100%   BMI 21.26 kg/m   Physical Examination:  General: Well developed, well nourished, NAD  HEENT: OP clear, mucus membranes moist  SKIN: warm, dry. No rashes. Neuro: No focal deficits  Musculoskeletal: Muscle strength 5/5 all ext  Psychiatric: Mood and affect normal  Neck: No JVD, no carotid bruits, no thyromegaly, no lymphadenopathy.  Lungs:Clear bilaterally, no wheezes, rhonci, crackles Cardiovascular: Regular rate and  rhythm. No murmurs, gallops or rubs. Abdomen:Soft. Bowel sounds present. Non-tender.  Extremities: No lower extremity edema. Pulses are 2 + in the bilateral DP/PT.  Echo 08/02/13: Normal LV function, LVEF=55-60%. Trace TR  EKG:  EKG is not ordered today. The ekg ordered today demonstrates  Recent Labs: 06/20/2019: ALT 13; BUN 40; Creatinine, Ser 1.36; Hemoglobin 10.6; Platelets 172.0; Potassium 3.6; Sodium 136; TSH 4.18   Lipid Panel    Component Value Date/Time   CHOL 199 07/28/2018 1423   TRIG 110.0 07/28/2018 1423   HDL 53.10 07/28/2018 1423   CHOLHDL 4 07/28/2018 1423   VLDL 22.0 07/28/2018 1423   LDLCALC 124 (H) 07/28/2018 1423     Wt Readings from Last 3 Encounters:  07/31/19 120 lb (54.4 kg)  06/20/19 124 lb (56.2 kg)  05/24/19 123 lb (55.8 kg)     Other studies Reviewed: Additional studies/ records that were reviewed today include: . Review of the above records demonstrates:    Assessment and Plan:   1. PAF/PACs/PVCs: Episode of atrial fibrillation documented by EMS in January 2021. She has had prior MAZE for PAF. She appears to be in sinus today. Continue Cardizem and Eliquis. Her CHADS VASC score is 7 (11.2% risk of stroke per year).   2. HTN: BP Is controlled. No changes today   3. PAD: She is followed in [facility] by Dr. Gwenlyn Found. No leg pain. ABI normal in October 2020.   Current medicines are reviewed at length with the patient today.  The patient does not have concerns regarding medicines.  The following changes have been made:  no change  Labs/ tests ordered today include:   No orders of the defined types were placed in this encounter.   Disposition:   FU with care team APP in 2-3 weeks with a CBC.   Signed, Lauree Chandler,  MD 07/31/2019 10:21 AM    [facility] [address], [place], [place]  [address] Phone: [phone]; Fax: [phone]

## 2019-07-31 NOTE — Patient Instructions (Signed)

## 2019-08-03 DIAGNOSIS — H401132 Primary open-angle glaucoma, bilateral, moderate stage: Secondary | ICD-10-CM | POA: Diagnosis not present

## 2019-09-19 NOTE — Progress Notes (Signed)
Phone [phone] In person visit   Subjective:   [name] is a 86 y.o. year old very pleasant female patient who presents for/with See problem oriented charting Chief Complaint  Patient presents with   Hypertension   This visit occurred during the SARS-CoV-2 public health emergency.  Safety protocols were in place, including screening questions prior to the visit, additional usage of staff PPE, and extensive cleaning of exam room while observing appropriate contact time as indicated for disinfecting solutions.   Past Medical History-  Patient Active Problem List   Diagnosis Date Noted   Chronic cough 08/12/2015    Priority: High   PAD (peripheral artery disease) ([place]) 05/10/2013    Priority: High   PAF (paroxysmal atrial fibrillation) ([place])     Priority: High   History of stroke     Priority: High   Hyperglycemia 06/25/2014    Priority: Medium   Hypothyroidism 06/07/2014    Priority: Medium   Hyperlipidemia 06/07/2014    Priority: Medium   Hypertension     Priority: Medium   GERD (gastroesophageal reflux disease) 06/25/2014    Priority: Low   Vitamin D deficiency 06/25/2014    Priority: Low   Intracervical pessary 06/07/2014    Priority: Low   Sinus bradycardia 05/19/2013    Priority: Low   PAC (premature atrial contraction) 05/20/2011    Priority: Low   CKD (chronic kidney disease), stage III 06/15/2018   Low vitamin B12 level 12/09/2017   Anemia 08/16/2017   Perennial allergic rhinitis 08/12/2015    Medications- reviewed and updated Current Outpatient Medications  Medication Sig Dispense Refill   apixaban (ELIQUIS) 2.5 MG TABS tablet Take 1 tablet (2.5 mg total) by mouth 2 (two) times daily. 60 tablet 6   ARMOUR THYROID 15 MG tablet TAKE 1 TABLET BY MOUTH EVERY DAY (Patient taking differently: Take 15 mg by mouth daily before breakfast. ) 90 tablet 1   Ascorbic Acid (VITAMIN C) 500 MG/5ML LIQD Take 1,500 mg by mouth daily.      chlorthalidone (HYGROTON) 25 MG tablet Take 1 tablet (25 mg total) by mouth daily. 90 tablet 3   COSOPT 22.3-6.8 MG/ML ophthalmic solution Place 1 drop into both eyes 2 (two) times daily. Reported on 06/21/2015  0   diltiazem (CARDIZEM CD) 120 MG 24 hr capsule Take 1 capsule (120 mg total) by mouth daily. 90 capsule 3   hydrALAZINE (APRESOLINE) 25 MG tablet Take 1 tablet (25 mg total) by mouth 3 (three) times daily. 270 tablet 3   irbesartan (AVAPRO) 300 MG tablet Take 1 tablet (300 mg total) by mouth daily. (Patient taking differently: Take 150 mg by mouth in the morning and at bedtime. ) 90 tablet 3   Multiple Vitamins-Minerals (ALIVE MULTI-VITAMIN) LIQD Take 15 mLs by mouth daily.     NON FORMULARY Take 1 Can by mouth See admin instructions. Dillard [place] Organic Nutrition Meal Replacement Drink or Protein Shake (Made Without Gluten, Soy, Dairy, or Corn)- Drink 1 can by mouth once daily     rosuvastatin (CRESTOR) 5 MG tablet Take 1 tablet (5 mg total) by mouth once a week. (Patient not taking: Reported on 09/26/2019) 13 tablet 3   No current facility-administered medications for this visit.     Objective:  BP (!) 144/68    Pulse (!) 47    Temp 98.4 F (36.9 C) (Temporal)    Ht 5\' 3"  (1.6 m)    Wt 121 lb 12.8 oz (55.2 kg)  SpO2 98%    BMI 21.58 kg/m  Gen: NAD, resting comfortably CV: slightly bradycardic but regular, no murmurs rubs or gallops Lungs: CTAB no crackles, wheeze, rhonchi Ext: trace edema Skin: warm, dry    Assessment and Plan   #hypertension S: medication: chlorthalidone 25mg  , hydralazine 25mg  3x a day, irbesartan 150mg  twice a day, diltiazem 120mg  XR- confirmed with pharmacy she is picking these up but shes not sure she is taking consistently.  Home readings #s: have been jumping all over. She has forgotten medications a few days.   Usually 130s to 150s if taking all meds consistently. Doesn't feel well with blodo pressure in 120s.  BP Readings from Last 3 Encounters:    09/26/19 (!) 144/68  07/31/19 130/74  06/20/19 (!) 142/68  A/P: Poor control but patient does not know which medicines she is taking completely and pharmacy contacted multiple times but we are unable to get records From AVS:  " Blood pressure: poorly controlled I believe you are on the following but want to confirm before making any adjustments 1. Chlorthalidone 25mg  daily (usually a morning medicine) 2. Diltiazem 120mg  extended release 3. Irbesartan/avapro 150mg  twice daily 4. Hydralazine 25 mg three times a day Please confirm with your home medicines today or tomorrow and let us know so we can consider adjustments "   #hyperlipidemia S: Medication:forgets rosuvastatin 5 mg once a week. Has pill box but not using  Lab Results  Component Value Date   CHOL 199 07/28/2018   HDL 53.10 07/28/2018   LDLCALC 124 (H) 07/28/2018   TRIG 110.0 07/28/2018   CHOLHDL 4 07/28/2018   A/P: strongly encouraged to restart rosuvastatin cholesterol medicine. Suspect poor control- consider LDL at follow up if consistently taking.   # bowel incontinence  S:started about a month ago if not longer. Using pads or dependz.  Has had 1-2 BM/day on average. Her bowel movements are very soft but not watery. Denies any abdominal pain, fever, nausea or vomiting. She has not had any blood in stool. Has had some vaginal discharge but has appointment with GYN next week.   No back pain or leg weakness. No urinary incontinence Lab Results  Component Value Date   TSH 4.18 06/20/2019    A/P: from avs " I want you to do those tightening muscles in the buttocks area that we discussed-count to 3 then relax for 10 repetitions at least 3 times a day for the next 2 to 3 weeks to see if this helps your issue.  If worsening issues with incontinence please let me know". No other red flags like severe back pain or urinary incontinence. No blood in stool or dark black stool.   # agrees to referral to hematology with anemia and  leukopenia  Recommended follow up: Return in about 1 month (around 10/27/2019) for follow up- or sooner if needed.  Lab/Order associations:   ICD-10-CM   1. Essential hypertension  I10   2. Leukopenia, unspecified type  D72.819 Ambulatory referral to Hematology  3. Anemia, unspecified type  D64.9 Ambulatory referral to Hematology  4. Hyperlipidemia, unspecified hyperlipidemia type  E78.5   5. Incontinence of feces, unspecified fecal incontinence type  R15.9     Meds ordered this encounter  Medications   rosuvastatin (CRESTOR) 5 MG tablet    Sig: Take 1 tablet (5 mg total) by mouth once a week.    Dispense:  13 tablet    Refill:  3   Time Spent: 36 minutes of  total time (2:00 PM- 2:56 PM (20 minutes of this used in a separate encounter though)) was spent on the date of the encounter performing the following actions: chart review prior to seeing the patient, obtaining history, performing a medically necessary exam, counseling on the treatment plan, placing orders, and documenting in our EHR.   Return precautions advised.  Garret Reddish, MD

## 2019-09-19 NOTE — Patient Instructions (Addendum)
Health Maintenance Due  Topic Date Due  . INFLUENZA VACCINE we do not have in office yet- should have this in 1-2 months- if you get at your pharmay please let us know 09/24/2019   We will call you within two weeks about your referral to hematology to evaluate low white blood counts and anemia. If you do not hear within 3 weeks, give [place] a call.   I want you to do those tightening muscles in the buttocks area that we discussed-count to 3 then relax for 10 repetitions at least 3 times a day for the next 2 to 3 weeks to see if this helps your issue.  If worsening issues with incontinence please let me know  Blood pressure: poorly controlled I believe you are on the following but want to confirm before making any adjustments. I also want you to take blood pressure at home daily for the next week and send me the results or call these in.  1. Chlorthalidone 25mg  daily (usually a morning medicine) 2. Diltiazem 120mg  extended release 3. Irbesartan/avapro 150mg  twice daily 4. Hydralazine 25 mg three times a day Please confirm with your home medicines today or tomorrow and let us know so we can consider adjustments  Return in about 1 month (around 10/27/2019) for follow up- or sooner if needed. Can use same day slot if needed

## 2019-09-26 ENCOUNTER — Encounter: Payer: Self-pay | Admitting: Family Medicine

## 2019-09-26 ENCOUNTER — Other Ambulatory Visit: Payer: Self-pay

## 2019-09-26 ENCOUNTER — Ambulatory Visit (INDEPENDENT_AMBULATORY_CARE_PROVIDER_SITE_OTHER): Payer: Medicare Other | Admitting: Family Medicine

## 2019-09-26 VITALS — BP 144/68 | HR 47 | Temp 98.4°F | Ht 63.0 in | Wt 121.8 lb

## 2019-09-26 DIAGNOSIS — D72819 Decreased white blood cell count, unspecified: Secondary | ICD-10-CM

## 2019-09-26 DIAGNOSIS — E785 Hyperlipidemia, unspecified: Secondary | ICD-10-CM

## 2019-09-26 DIAGNOSIS — R159 Full incontinence of feces: Secondary | ICD-10-CM

## 2019-09-26 DIAGNOSIS — D649 Anemia, unspecified: Secondary | ICD-10-CM | POA: Diagnosis not present

## 2019-09-26 DIAGNOSIS — I10 Essential (primary) hypertension: Secondary | ICD-10-CM

## 2019-09-26 MED ORDER — ROSUVASTATIN CALCIUM 5 MG PO TABS: 5.0000 mg | ORAL_TABLET | ORAL | 3 refills | Status: DC

## 2019-09-27 ENCOUNTER — Telehealth: Payer: Self-pay | Admitting: Physician Assistant

## 2019-09-27 ENCOUNTER — Telehealth: Payer: Self-pay | Admitting: Family Medicine

## 2019-09-27 MED ORDER — HYDRALAZINE HCL 50 MG PO TABS: 50.0000 mg | ORAL_TABLET | Freq: Three times a day (TID) | ORAL | 3 refills | Status: DC

## 2019-09-27 NOTE — Telephone Encounter (Signed)
Received a new hem referral from Dr. Yong Channel at [address] [facility] for leukopenia/anemia. Due to possible exposure to Covid, [name] has been cld and scheduled to see Cassie on 9/7 at 130pm w/labs at 1pm. Pt aware toa rrive 15 minutes early. Letter mailed.

## 2019-09-27 NOTE — Telephone Encounter (Signed)
Please advise 

## 2019-09-27 NOTE — Telephone Encounter (Signed)
I increased this to 50 mg 3 times a day.  We have a follow-up visit in 1 month to recheck  Also check in on patient-her husband was hospitalized with COVID-19.  She doing okay?  Please make sure she has no symptoms such as- No fever, chills, cough, congestion, runny nose, shortness of breath, fatigue, body aches, sore throat, headache, nausea, vomiting, diarrhea, or new loss of taste or smell. No known contacts with covid 19 or someone being tested for covid 19.   If any symptoms have her get tested ASAP. If no symptoms I would still recommend getting tested 3 days from her last contact with him

## 2019-09-27 NOTE — Telephone Encounter (Signed)
Caller states she checked and she takes the 4 medications.

## 2019-09-27 NOTE — Addendum Note (Signed)
Addended by: [name] on: 09/27/2019 10:27 AM   Modules accepted: Orders

## 2019-09-28 NOTE — Telephone Encounter (Signed)
Called and spoke with pt and she is doing fine, no symptoms.

## 2019-10-09 ENCOUNTER — Telehealth: Payer: Self-pay | Admitting: Family Medicine

## 2019-10-09 NOTE — Telephone Encounter (Signed)
So this is different- this would be more like social work- you can do social work consult as well under husbands referral. They may be looking at potential placement. Family may also want to start looking into options for their parents next steps if they are not able to stay in their home

## 2019-10-09 NOTE — Telephone Encounter (Signed)
Patient's daughter is calling in asking if Dr.Hunter could put in a referral for assistance, states she is not really able to care for for her self 24/7.

## 2019-10-09 NOTE — Telephone Encounter (Signed)
Do you need to see in office?

## 2019-10-11 ENCOUNTER — Emergency Department (HOSPITAL_COMMUNITY): Payer: Medicare Other

## 2019-10-11 ENCOUNTER — Other Ambulatory Visit: Payer: Self-pay

## 2019-10-11 ENCOUNTER — Inpatient Hospital Stay (HOSPITAL_COMMUNITY)
Admission: EM | Admit: 2019-10-11 | Discharge: 2019-10-18 | DRG: 177 | Disposition: A | Discharge status: Home / Self Care | Payer: Medicare Other | Attending: Internal Medicine | Admitting: Internal Medicine

## 2019-10-11 DIAGNOSIS — N1832 Chronic kidney disease, stage 3b: Secondary | ICD-10-CM | POA: Diagnosis present

## 2019-10-11 DIAGNOSIS — Z8049 Family history of malignant neoplasm of other genital organs: Secondary | ICD-10-CM | POA: Diagnosis not present

## 2019-10-11 DIAGNOSIS — Z8673 Personal history of transient ischemic attack (TIA), and cerebral infarction without residual deficits: Secondary | ICD-10-CM | POA: Diagnosis not present

## 2019-10-11 DIAGNOSIS — I4819 Other persistent atrial fibrillation: Secondary | ICD-10-CM | POA: Diagnosis present

## 2019-10-11 DIAGNOSIS — N320 Bladder-neck obstruction: Secondary | ICD-10-CM | POA: Diagnosis present

## 2019-10-11 DIAGNOSIS — Z8249 Family history of ischemic heart disease and other diseases of the circulatory system: Secondary | ICD-10-CM

## 2019-10-11 DIAGNOSIS — I480 Paroxysmal atrial fibrillation: Secondary | ICD-10-CM

## 2019-10-11 DIAGNOSIS — N179 Acute kidney failure, unspecified: Secondary | ICD-10-CM

## 2019-10-11 DIAGNOSIS — R7989 Other specified abnormal findings of blood chemistry: Secondary | ICD-10-CM | POA: Diagnosis present

## 2019-10-11 DIAGNOSIS — J1282 Pneumonia due to coronavirus disease 2019: Secondary | ICD-10-CM | POA: Diagnosis present

## 2019-10-11 DIAGNOSIS — Z882 Allergy status to sulfonamides status: Secondary | ICD-10-CM

## 2019-10-11 DIAGNOSIS — Z209 Contact with and (suspected) exposure to unspecified communicable disease: Secondary | ICD-10-CM | POA: Diagnosis not present

## 2019-10-11 DIAGNOSIS — E785 Hyperlipidemia, unspecified: Secondary | ICD-10-CM | POA: Diagnosis present

## 2019-10-11 DIAGNOSIS — Z95820 Peripheral vascular angioplasty status with implants and grafts: Secondary | ICD-10-CM

## 2019-10-11 DIAGNOSIS — J9600 Acute respiratory failure, unspecified whether with hypoxia or hypercapnia: Secondary | ICD-10-CM | POA: Diagnosis not present

## 2019-10-11 DIAGNOSIS — D1771 Benign lipomatous neoplasm of kidney: Secondary | ICD-10-CM | POA: Diagnosis present

## 2019-10-11 DIAGNOSIS — D631 Anemia in chronic kidney disease: Secondary | ICD-10-CM | POA: Diagnosis present

## 2019-10-11 DIAGNOSIS — E7800 Pure hypercholesterolemia, unspecified: Secondary | ICD-10-CM | POA: Diagnosis present

## 2019-10-11 DIAGNOSIS — R531 Weakness: Secondary | ICD-10-CM | POA: Diagnosis not present

## 2019-10-11 DIAGNOSIS — I10 Essential (primary) hypertension: Secondary | ICD-10-CM

## 2019-10-11 DIAGNOSIS — Z801 Family history of malignant neoplasm of trachea, bronchus and lung: Secondary | ICD-10-CM | POA: Diagnosis not present

## 2019-10-11 DIAGNOSIS — I129 Hypertensive chronic kidney disease with stage 1 through stage 4 chronic kidney disease, or unspecified chronic kidney disease: Secondary | ICD-10-CM | POA: Diagnosis present

## 2019-10-11 DIAGNOSIS — I4510 Unspecified right bundle-branch block: Secondary | ICD-10-CM | POA: Diagnosis present

## 2019-10-11 DIAGNOSIS — E8770 Fluid overload, unspecified: Secondary | ICD-10-CM | POA: Diagnosis not present

## 2019-10-11 DIAGNOSIS — Z283 Underimmunization status: Secondary | ICD-10-CM

## 2019-10-11 DIAGNOSIS — Z7901 Long term (current) use of anticoagulants: Secondary | ICD-10-CM | POA: Diagnosis not present

## 2019-10-11 DIAGNOSIS — Z91011 Allergy to milk products: Secondary | ICD-10-CM | POA: Diagnosis not present

## 2019-10-11 DIAGNOSIS — J9601 Acute respiratory failure with hypoxia: Secondary | ICD-10-CM | POA: Diagnosis present

## 2019-10-11 DIAGNOSIS — H409 Unspecified glaucoma: Secondary | ICD-10-CM | POA: Diagnosis present

## 2019-10-11 DIAGNOSIS — Z9842 Cataract extraction status, left eye: Secondary | ICD-10-CM

## 2019-10-11 DIAGNOSIS — U071 COVID-19: Secondary | ICD-10-CM | POA: Diagnosis present

## 2019-10-11 DIAGNOSIS — K219 Gastro-esophageal reflux disease without esophagitis: Secondary | ICD-10-CM | POA: Diagnosis present

## 2019-10-11 DIAGNOSIS — R0602 Shortness of breath: Secondary | ICD-10-CM | POA: Diagnosis not present

## 2019-10-11 DIAGNOSIS — E039 Hypothyroidism, unspecified: Secondary | ICD-10-CM | POA: Diagnosis present

## 2019-10-11 DIAGNOSIS — Z888 Allergy status to other drugs, medicaments and biological substances status: Secondary | ICD-10-CM

## 2019-10-11 DIAGNOSIS — Z808 Family history of malignant neoplasm of other organs or systems: Secondary | ICD-10-CM

## 2019-10-11 DIAGNOSIS — R0902 Hypoxemia: Secondary | ICD-10-CM | POA: Diagnosis not present

## 2019-10-11 DIAGNOSIS — Z91018 Allergy to other foods: Secondary | ICD-10-CM

## 2019-10-11 DIAGNOSIS — N281 Cyst of kidney, acquired: Secondary | ICD-10-CM | POA: Diagnosis not present

## 2019-10-11 DIAGNOSIS — Z79899 Other long term (current) drug therapy: Secondary | ICD-10-CM

## 2019-10-11 DIAGNOSIS — J189 Pneumonia, unspecified organism: Secondary | ICD-10-CM | POA: Diagnosis not present

## 2019-10-11 LAB — CBC WITH DIFFERENTIAL/PLATELET
Abs Immature Granulocytes: 0.1 10*3/uL — ABNORMAL HIGH (ref 0.00–0.07)
Basophils Absolute: 0 10*3/uL (ref 0.0–0.1)
Basophils Relative: 0 %
Eosinophils Absolute: 0 10*3/uL (ref 0.0–0.5)
Eosinophils Relative: 0 %
HCT: 35.8 % — ABNORMAL LOW (ref 36.0–46.0)
Hemoglobin: 11.4 g/dL — ABNORMAL LOW (ref 12.0–15.0)
Immature Granulocytes: 2 %
Lymphocytes Relative: 15 %
Lymphs Abs: 0.9 10*3/uL (ref 0.7–4.0)
MCH: 27.7 pg (ref 26.0–34.0)
MCHC: 31.8 g/dL (ref 30.0–36.0)
MCV: 86.9 fL (ref 80.0–100.0)
Monocytes Absolute: 0.3 10*3/uL (ref 0.1–1.0)
Monocytes Relative: 6 %
Neutro Abs: 4.4 10*3/uL (ref 1.7–7.7)
Neutrophils Relative %: 77 %
Platelets: 289 10*3/uL (ref 150–400)
RBC: 4.12 MIL/uL (ref 3.87–5.11)
RDW: 14 % (ref 11.5–15.5)
Smear Review: NORMAL
WBC: 5.8 10*3/uL (ref 4.0–10.5)
nRBC: 0 % (ref 0.0–0.2)

## 2019-10-11 LAB — COMPREHENSIVE METABOLIC PANEL
ALT: 21 U/L (ref 0–44)
AST: 86 U/L — ABNORMAL HIGH (ref 15–41)
Albumin: 3.3 g/dL — ABNORMAL LOW (ref 3.5–5.0)
Alkaline Phosphatase: 66 U/L (ref 38–126)
Anion gap: 12 (ref 5–15)
BUN: 55 mg/dL — ABNORMAL HIGH (ref 8–23)
CO2: 21 mmol/L — ABNORMAL LOW (ref 22–32)
Calcium: 8.9 mg/dL (ref 8.9–10.3)
Chloride: 101 mmol/L (ref 98–111)
Creatinine, Ser: 1.54 mg/dL — ABNORMAL HIGH (ref 0.44–1.00)
GFR calc Af Amer: 34 mL/min — ABNORMAL LOW (ref >60)
GFR calc non Af Amer: 29 mL/min — ABNORMAL LOW (ref >60)
Glucose, Bld: 146 mg/dL — ABNORMAL HIGH (ref 70–99)
Potassium: 4.2 mmol/L (ref 3.5–5.1)
Sodium: 134 mmol/L — ABNORMAL LOW (ref 135–145)
Total Bilirubin: 2.6 mg/dL — ABNORMAL HIGH (ref 0.3–1.2)
Total Protein: 7.1 g/dL (ref 6.5–8.1)

## 2019-10-11 LAB — ABO/RH: ABO/RH(D): B POS

## 2019-10-11 LAB — PROCALCITONIN: Procalcitonin: 2.17 ng/mL

## 2019-10-11 LAB — LACTIC ACID, PLASMA: Lactic Acid, Venous: 1.5 mmol/L (ref 0.5–1.9)

## 2019-10-11 LAB — D-DIMER, QUANTITATIVE: D-Dimer, Quant: 2.78 ug/mL-FEU — ABNORMAL HIGH (ref 0.00–0.50)

## 2019-10-11 LAB — TRIGLYCERIDES: Triglycerides: 180 mg/dL — ABNORMAL HIGH (ref <150)

## 2019-10-11 LAB — C-REACTIVE PROTEIN: CRP: 19 mg/dL — ABNORMAL HIGH (ref <1.0)

## 2019-10-11 LAB — SARS CORONAVIRUS 2 BY RT PCR (HOSPITAL ORDER, PERFORMED IN ~~LOC~~ HOSPITAL LAB): SARS Coronavirus 2: POSITIVE — AB

## 2019-10-11 LAB — FERRITIN: Ferritin: >7500 ng/mL — ABNORMAL HIGH (ref 11–307)

## 2019-10-11 LAB — TROPONIN I (HIGH SENSITIVITY)
Troponin I (High Sensitivity): 45 ng/L — ABNORMAL HIGH (ref <18)
Troponin I (High Sensitivity): 40 ng/L — ABNORMAL HIGH (ref <18)

## 2019-10-11 LAB — FIBRINOGEN: Fibrinogen: >800 mg/dL — ABNORMAL HIGH (ref 210–475)

## 2019-10-11 LAB — LACTATE DEHYDROGENASE: LDH: 1021 U/L — ABNORMAL HIGH (ref 98–192)

## 2019-10-11 MED ORDER — HEPARIN SODIUM (PORCINE) 5000 UNIT/ML IJ SOLN: 5000.0000 [IU] | Freq: Three times a day (TID) | INTRAMUSCULAR | Status: DC

## 2019-10-11 MED ORDER — ACETAMINOPHEN 325 MG PO TABS
650.0000 mg | ORAL_TABLET | Freq: Four times a day (QID) | ORAL | Status: DC | PRN
  Filled 2019-10-11: qty 2

## 2019-10-11 MED ORDER — SODIUM CHLORIDE 0.9 % IV SOLN
200.0000 mg | Freq: Once | INTRAVENOUS | Status: AC
  Administered 2019-10-11: 200 mg via INTRAVENOUS
  Filled 2019-10-11: qty 200

## 2019-10-11 MED ORDER — GUAIFENESIN-DM 100-10 MG/5ML PO SYRP
10.0000 mL | ORAL_SOLUTION | ORAL | Status: DC | PRN
  Administered 2019-10-12 – 2019-10-15 (×3): 10 mL via ORAL
  Filled 2019-10-11 (×3): qty 10

## 2019-10-11 MED ORDER — ONDANSETRON HCL 4 MG/2ML IJ SOLN
4.0000 mg | Freq: Four times a day (QID) | INTRAMUSCULAR | Status: DC | PRN
  Administered 2019-10-14: 4 mg via INTRAVENOUS
  Filled 2019-10-11 (×2): qty 2

## 2019-10-11 MED ORDER — DILTIAZEM HCL ER COATED BEADS 120 MG PO CP24
120.0000 mg | ORAL_CAPSULE | Freq: Every day | ORAL | Status: DC
  Administered 2019-10-12 – 2019-10-15 (×4): 120 mg via ORAL
  Filled 2019-10-11 (×4): qty 1

## 2019-10-11 MED ORDER — SODIUM CHLORIDE 0.9 % IV SOLN: 250.0000 mL | INTRAVENOUS | Status: DC | PRN

## 2019-10-11 MED ORDER — ONDANSETRON HCL 4 MG PO TABS: 4.0000 mg | ORAL_TABLET | Freq: Four times a day (QID) | ORAL | Status: DC | PRN

## 2019-10-11 MED ORDER — ALBUTEROL SULFATE HFA 108 (90 BASE) MCG/ACT IN AERS
2.0000 | INHALATION_SPRAY | Freq: Four times a day (QID) | RESPIRATORY_TRACT | Status: DC
  Administered 2019-10-11 – 2019-10-14 (×10): 2 via RESPIRATORY_TRACT
  Filled 2019-10-11 (×5): qty 6.7

## 2019-10-11 MED ORDER — SODIUM CHLORIDE 0.9 % IV SOLN
100.0000 mg | Freq: Every day | INTRAVENOUS | Status: AC
  Administered 2019-10-12 – 2019-10-15 (×4): 100 mg via INTRAVENOUS
  Filled 2019-10-11 (×4): qty 20

## 2019-10-11 MED ORDER — SODIUM CHLORIDE 0.9% FLUSH: 3.0000 mL | Freq: Two times a day (BID) | INTRAVENOUS | Status: DC

## 2019-10-11 MED ORDER — ASCORBIC ACID 500 MG PO TABS
500.0000 mg | ORAL_TABLET | Freq: Every day | ORAL | Status: DC
  Administered 2019-10-11 – 2019-10-18 (×8): 500 mg via ORAL
  Filled 2019-10-11 (×8): qty 1

## 2019-10-11 MED ORDER — METHYLPREDNISOLONE SODIUM SUCC 40 MG IJ SOLR
0.5000 mg/kg | Freq: Two times a day (BID) | INTRAMUSCULAR | Status: DC
  Administered 2019-10-11 – 2019-10-12 (×2): 27.6 mg via INTRAVENOUS
  Filled 2019-10-11 (×2): qty 1

## 2019-10-11 MED ORDER — THYROID 30 MG PO TABS
15.0000 mg | ORAL_TABLET | Freq: Every day | ORAL | Status: DC
  Administered 2019-10-13 – 2019-10-18 (×6): 15 mg via ORAL
  Filled 2019-10-11 (×9): qty 1

## 2019-10-11 MED ORDER — SODIUM CHLORIDE 0.9% FLUSH: 3.0000 mL | INTRAVENOUS | Status: DC | PRN

## 2019-10-11 MED ORDER — HYDROCOD POLST-CPM POLST ER 10-8 MG/5ML PO SUER: 5.0000 mL | Freq: Two times a day (BID) | ORAL | Status: DC | PRN

## 2019-10-11 MED ORDER — APIXABAN 2.5 MG PO TABS
2.5000 mg | ORAL_TABLET | Freq: Two times a day (BID) | ORAL | Status: DC
  Administered 2019-10-12 – 2019-10-13 (×5): 2.5 mg via ORAL
  Filled 2019-10-11 (×7): qty 1

## 2019-10-11 MED ORDER — ZINC SULFATE 220 (50 ZN) MG PO CAPS
220.0000 mg | ORAL_CAPSULE | Freq: Every day | ORAL | Status: DC
  Administered 2019-10-13 – 2019-10-18 (×6): 220 mg via ORAL
  Filled 2019-10-11 (×8): qty 1

## 2019-10-11 NOTE — ED Notes (Signed)
Pt moved on to hospital bed for increased comfort.  Warm blankets given and call bell and phone in place.  Pt helped to drink her vanilla supplement and gave her the Incentive spirometer and helped her use it x10.  Pt was able to bring it to 750 and did have some coughing after, tolerated this well.  Will call and update family per pt request. RT called for flutter valve

## 2019-10-11 NOTE — ED Notes (Signed)
Family states that they were taking care of patient at home, including giving her ivermectin. Pt was not vaccinated, not wanted due to allergies.

## 2019-10-11 NOTE — Telephone Encounter (Signed)
Patient's daughter is asking for an update, states she is needing answers soon.

## 2019-10-11 NOTE — ED Provider Notes (Signed)
[facility] EMERGENCY DEPARTMENT Provider Note   CSN: [id number] Arrival date & time: 10/11/19  1315     History No chief complaint on file.   [name] is a 86 y.o. female.  The history is provided by the patient and medical records. No language interpreter was used.     86 year old significant history of persistent atrial fibrillation currently on Eliquis, prior stroke, PAD, hypertension, hypercholesterolemia brought here via EMS from home for evaluation of generalized weakness.  Per patient, her husband previously tested positive for Covid 2 weeks ago requiring hospitalization and is currently under rehab.  For the past week patient endorsed progressive worsening generalized weakness, shortness of breath, coughing, having mild nausea and overall not feeling well.  She is having difficulty performing daily activity.  States it has had to help her with simple tasks.  She does not complain of any fever chills no significant pain no chest pain no abdominal pain no dysuria no loss of taste or smell.  She has not been vaccinated for Covid.  She denies any specific treatment tried at home.  She is mostly resting in bed.    Past Medical History:  Diagnosis Date  . Arthritis   . Chicken pox   . Glaucoma of both eyes   . High cholesterol   . Hypertension    a. Normal renal arteries by duplex 2013.  [name] Hypothyroidism   . Migraine   . PAD (peripheral artery disease) ([place])    a. 04/2013: s/p PCI to occluded right popliteal artery   . PAF (paroxysmal atrial fibrillation) ([place])    a. s/p MAZE 2006. b. Event monitor 2013: NSR with PACs.  . Sinus bradycardia    a. 03/2011 during hospital admission - beta blocker stopped.  . Stroke [facility])    a. Pt reports 3-4 prior strokes without residual sx. b. CVA 2013 (presented with headache) - started on Plavix. Event monitor as outpt - no AF.    Patient Active Problem List   Diagnosis Date Noted  . CKD (chronic kidney disease),  stage III 06/15/2018  . Low vitamin B12 level 12/09/2017  . Anemia 08/16/2017  . Chronic cough 08/12/2015  . Perennial allergic rhinitis 08/12/2015  . GERD (gastroesophageal reflux disease) 06/25/2014  . Hyperglycemia 06/25/2014  . Vitamin D deficiency 06/25/2014  . Hypothyroidism 06/07/2014  . Hyperlipidemia 06/07/2014  . Intracervical pessary 06/07/2014  . Sinus bradycardia 05/19/2013  . PAD (peripheral artery disease) ([place]) 05/10/2013  . PAC (premature atrial contraction) 05/20/2011  . PAF (paroxysmal atrial fibrillation) ([place])   . History of stroke   . Hypertension     Past Surgical History:  Procedure Laterality Date  . CATARACT EXTRACTION Left 11/09/2017  . LOWER EXTREMITY ANGIOGRAM N/A 05/15/2013   Procedure: LOWER EXTREMITY ANGIOGRAM;  Surgeon: Lorretta Harp, MD;  Location: [facility] CATH LAB;  Service: Cardiovascular;  Laterality: N/A;  . MAZE  ~ 2006  . POPLITEAL ARTERY STENT  05/15/2013     OB History   No obstetric history on file.     Family History  Problem Relation Age of Onset  . Hypertension Mother   . Ulcers Father   . Cervical cancer Sister   . Throat cancer Brother   . Lung cancer Brother   . Cancer Brother   . Allergic rhinitis Neg Hx   . Angioedema Neg Hx   . Asthma Neg Hx   . Atopy Neg Hx   . Eczema Neg Hx   .  Immunodeficiency Neg Hx   . Urticaria Neg Hx     Social History   Tobacco Use  . Smoking status: Never Smoker  . Smokeless tobacco: Never Used  Vaping Use  . Vaping Use: Never used  Substance Use Topics  . Alcohol use: No    Alcohol/week: 0.0 standard drinks  . Drug use: No    Home Medications Prior to Admission medications   Medication Sig Start Date End Date Taking? Authorizing Provider  apixaban (ELIQUIS) 2.5 MG TABS tablet Take 1 tablet (2.5 mg total) by mouth 2 (two) times daily. 04/06/19   Burnell Blanks, MD  ARMOUR THYROID 15 MG tablet TAKE 1 TABLET BY MOUTH EVERY DAY Patient taking differently: Take 15 mg by  mouth daily before breakfast.  03/30/19   [name], MD  Ascorbic Acid (VITAMIN C) 500 MG/5ML LIQD Take 1,500 mg by mouth daily.    [provider]  chlorthalidone (HYGROTON) 25 MG tablet Take 1 tablet (25 mg total) by mouth daily. 05/24/19   [name], MD  COSOPT 22.3-6.8 MG/ML ophthalmic solution Place 1 drop into both eyes 2 (two) times daily. Reported on 06/21/2015 04/24/14   [provider]  diltiazem (CARDIZEM CD) 120 MG 24 hr capsule Take 1 capsule (120 mg total) by mouth daily. 04/06/19   Burnell Blanks, MD  hydrALAZINE (APRESOLINE) 50 MG tablet Take 1 tablet (50 mg total) by mouth 3 (three) times daily. 09/27/19   [name], MD  irbesartan (AVAPRO) 300 MG tablet Take 1 tablet (300 mg total) by mouth daily. Patient taking differently: Take 150 mg by mouth in the morning and at bedtime.  04/13/19   [name], MD  Multiple Vitamins-Minerals (ALIVE MULTI-VITAMIN) LIQD Take 15 mLs by mouth daily.    [provider]  NON FORMULARY Take 1 Can by mouth See admin instructions. Dillard [place] Organic Nutrition Meal Replacement Drink or Protein Shake (Made Without Gluten, Soy, Dairy, or Corn)- Drink 1 can by mouth once daily    [provider]  rosuvastatin (CRESTOR) 5 MG tablet Take 1 tablet (5 mg total) by mouth once a week. 09/26/19   [name], MD    Allergies    Aceon [perindopril erbumine], Amlodipine, Aspirin, Azor [amlodipine-olmesartan], Beta adrenergic blockers, Hydralazine, Milk-related compounds, Other, Peanut-containing drug products, Sulfamethoxazole-trimethoprim, and Vitamin d analogs  Review of Systems   Review of Systems  All other systems reviewed and are negative.   Physical Exam Updated Vital Signs BP (!) 161/59   Pulse 61   Temp 98.5 F (36.9 C) (Oral)   Resp (!) 30   Ht 5\' 3"  (1.6 m)   Wt 55 kg   SpO2 100%   BMI 21.48 kg/m   Physical Exam Vitals and nursing note reviewed.  Constitutional:       Appearance: She is well-developed. She is ill-appearing.     Comments: Ill-appearing elderly female laying in bed in no acute discomfort.  HENT:     Head: Atraumatic.  Eyes:     Conjunctiva/sclera: Conjunctivae normal.  Cardiovascular:     Rate and Rhythm: Normal rate and regular rhythm.     Pulses: Normal pulses.     Heart sounds: Normal heart sounds.  Pulmonary:     Breath sounds: Rales (Fine crackles heard throughout lung fields) present.  Abdominal:     Palpations: Abdomen is soft.     Tenderness: There is no abdominal tenderness.  Musculoskeletal:  General: No swelling.     Cervical back: Neck supple.  Skin:    Findings: No rash.  Neurological:     Mental Status: She is alert. She is disoriented.  Psychiatric:        Mood and Affect: Mood normal.     ED Results / Procedures / Treatments   Labs (all labs ordered are listed, but only abnormal results are displayed) Labs Reviewed  CBC WITH DIFFERENTIAL/PLATELET - Abnormal; Notable for the following components:      Result Value   Hemoglobin 11.4 (*)    HCT 35.8 (*)    All other components within normal limits  COMPREHENSIVE METABOLIC PANEL - Abnormal; Notable for the following components:   Sodium 134 (*)    CO2 21 (*)    Glucose, Bld 146 (*)    BUN 55 (*)    Creatinine, Ser 1.54 (*)    Albumin 3.3 (*)    AST 86 (*)    Total Bilirubin 2.6 (*)    GFR calc non Af Amer 29 (*)    GFR calc Af Amer 34 (*)    All other components within normal limits  D-DIMER, QUANTITATIVE (NOT AT [facility]) - Abnormal; Notable for the following components:   D-Dimer, Quant 2.78 (*)    All other components within normal limits  LACTATE DEHYDROGENASE - Abnormal; Notable for the following components:   LDH 1,021 (*)    All other components within normal limits  FIBRINOGEN - Abnormal; Notable for the following components:   Fibrinogen >800 (*)    All other components within normal limits  TRIGLYCERIDES - Abnormal; Notable for the  following components:   Triglycerides 180 (*)    All other components within normal limits  CULTURE, BLOOD (ROUTINE X 2)  CULTURE, BLOOD (ROUTINE X 2)  SARS CORONAVIRUS 2 BY RT PCR (HOSPITAL ORDER, [facility])  LACTIC ACID, PLASMA  PROCALCITONIN  LACTIC ACID, PLASMA  PROCALCITONIN  FERRITIN  C-REACTIVE PROTEIN  TROPONIN I (HIGH SENSITIVITY)    EKG EKG Interpretation  Date/Time:  Wednesday October 11 2019 13:45:50 EDT Ventricular Rate:  64 PR Interval:    QRS Duration: 122 QT Interval:  459 QTC Calculation: 474 R Axis:   47 Text Interpretation: Sinus rhythm Right bundle branch block Probable anteroseptal infarct, old Repol abnrm suggests ischemia, lateral leads st depression in the inferior and lateral leads seen on prior, though slightly more pronounced Otherwise no significant change Confirmed by Deno Etienne [phone]) on 10/11/2019 2:10:53 PM   Radiology DG Chest Port 1 View  Result Date: 10/11/2019 CLINICAL DATA:  Weakness shortness of breath for 1 week EXAM: PORTABLE CHEST 1 VIEW COMPARISON:  05/16/2019 FINDINGS: Right lower lobe and left lower lung hazy airspace disease. No pleural effusion or pneumothorax. Stable cardiomediastinal silhouette. No aggressive osseous lesion. IMPRESSION: Bilateral lower lobe hazy airspace disease concerning for pneumonia including atypical viral pneumonia. Electronically Signed   By: Kathreen Devoid   On: 10/11/2019 14:48    Procedures Procedures (including critical care time)  Medications Ordered in ED Medications  sodium chloride flush (NS) 0.9 % injection 3 mL (3 mLs Intravenous Not Given 10/11/19 1354)  sodium chloride flush (NS) 0.9 % injection 3 mL (has no administration in time range)  0.9 %  sodium chloride infusion (has no administration in time range)    ED Course  I have reviewed the triage vital signs and the nursing notes.  Pertinent labs & imaging results that were available  during my care of the patient  were reviewed by me and considered in my medical decision making (see chart for details).  Clinical Course as of Oct 11 1515  Wed Oct 11, 2019  1513 Fibrinogen(!): >800 [AS]    Clinical Course User Index [AS] [place], Amelia, Student-PA   MDM Rules/Calculators/A&P                          BP (!) 161/59   Pulse 61   Temp 98.5 F (36.9 C) (Oral)   Resp (!) 30   Ht 5\' 3"  (1.6 m)   Wt 55 kg   SpO2 100%   BMI 21.48 kg/m   Final Clinical Impression(s) / ED Diagnoses Final diagnoses:  Acute respiratory failure due to COVID-19 [facility])    Rx / DC Orders ED Discharge Orders    None     2:03 PM Patient here with symptoms concerning for COVID-19.  Husband previously hospitalized for Covid infection.  She is here with generalized weakness cough and new hypoxia requiring oxygen supplementation.  Coarse lung sounds on exam.  Work-up initiated, anticipate admission for suspected Covid infection.  Care discussed with Dr. Tyrone Nine.  Pt have not been vaccinated for covid-19.  MALLIKA SANMIGUEL was evaluated in Emergency Department on 10/11/2019 for the symptoms described in the history of present illness. She was evaluated in the context of the global COVID-19 pandemic, which necessitated consideration that the patient might be at risk for infection with the SARS-CoV-2 virus that causes COVID-19. Institutional protocols and algorithms that pertain to the evaluation of patients at risk for COVID-19 are in a state of rapid change based on information released by regulatory bodies including the CDC and federal and state organizations. These policies and algorithms were followed during the patient's care in the ED.  2:56 PM Chest x-ray demonstrate bilateral lower lobe hazy airspace disease concerning for pneumonia including atypical viral pneumonia.  Her inflammatory markers are elevated which includes triglyceride 180, fibrinogen greater than 800, LDH 1021, D-dimer of 2.78, and transaminitis with AST 86, ALT  21, total bili bili of 2.6.  Her COVID-19 test is currently pending.  3:19 PM Troponin is elevated at 46, she does have some EKG changes, suspect this is likely demand ischemia from her underlying pulmonary infection.  Appreciate consultation from [facility] hospitalist, Dr. Cruzita Lederer who agrees to see and admit patient for further management of her condition.  Currently her Covid test is pending.  IRETTA MANGRUM was evaluated in Emergency Department on 10/11/2019 for the symptoms described in the history of present illness. She was evaluated in the context of the global COVID-19 pandemic, which necessitated consideration that the patient might be at risk for infection with the SARS-CoV-2 virus that causes COVID-19. Institutional protocols and algorithms that pertain to the evaluation of patients at risk for COVID-19 are in a state of rapid change based on information released by regulatory bodies including the CDC and federal and state organizations. These policies and algorithms were followed during the patient's care in the ED.     Domenic Moras, PA-C 10/11/19 [place], [place], DO 10/13/19 1340

## 2019-10-11 NOTE — H&P (Signed)
History and Physical    [name] OIN:[id number] DOB: Apr 15, 1925 DOA: 10/11/2019  I have briefly reviewed the patient's prior medical records in [place]  PCP: [name], MD  Patient coming from: home  Chief Complaint: Shortness of breath  HPI: [name] is a 86 y.o. female with medical history significant of paroxysmal A. fib on Eliquis, prior CVA, PAD, HTN, HLD was brought into the hospital for generalized weakness as well as mild shortness of breath.  Patient has been feeling sick for the past week.  She tells me that her husband tested positive for Covid about couple weeks ago requiring hospitalization and currently in rehab.  She has been getting progressively weaker, more short of breath, coughing and having mild nausea without vomiting.  She denies any fever or chills, denies any cough or chest congestion.  No loss of taste or smell.  She was not vaccinated for Covid due to concern for her multiple allergies.  ED Course: In the ER she is afebrile at 98.5, tachypneic with respiratory rate in the mid 20s, and satting at 90% on room air and required 2 L nasal cannula which helped her sats 200%.  She is normotensive.  Blood work reveals creatinine 1.54, LDH 1021, high-sensitivity troponin at 45, elevated ferritin and CRP of 19.  Her procalcitonin is 2.17. Chest x-ray showed bilateral lower lobe airspace disease concerning for atypical viral pneumonia.  Covid test is pending but there is extremely high suspicion for Covid pneumonia and we are asked for admits.  Review of Systems: All systems reviewed, and apart from HPI, all negative  Past Medical History:  Diagnosis Date  . Arthritis   . Chicken pox   . Glaucoma of both eyes   . High cholesterol   . Hypertension    a. Normal renal arteries by duplex 2013.  [name] Hypothyroidism   . Migraine   . PAD (peripheral artery disease) ([place])    a. 04/2013: s/p PCI to occluded right popliteal artery   . PAF (paroxysmal atrial  fibrillation) ([place])    a. s/p MAZE 2006. b. Event monitor 2013: NSR with PACs.  . Sinus bradycardia    a. 03/2011 during hospital admission - beta blocker stopped.  . Stroke [facility])    a. Pt reports 3-4 prior strokes without residual sx. b. CVA 2013 (presented with headache) - started on Plavix. Event monitor as outpt - no AF.    Past Surgical History:  Procedure Laterality Date  . CATARACT EXTRACTION Left 11/09/2017  . LOWER EXTREMITY ANGIOGRAM N/A 05/15/2013   Procedure: LOWER EXTREMITY ANGIOGRAM;  Surgeon: Lorretta Harp, MD;  Location: [facility] CATH LAB;  Service: Cardiovascular;  Laterality: N/A;  . MAZE  ~ 2006  . POPLITEAL ARTERY STENT  05/15/2013     reports that she has never smoked. She has never used smokeless tobacco. She reports that she does not drink alcohol and does not use drugs.  Allergies  Allergen Reactions  . Aceon [Perindopril Erbumine] Cough  . Amlodipine Other (See Comments)    Lower extremity discomfort  . Aspirin Other (See Comments) and Cough    STOPPED BY A MEDICAL PROFESSIONAL  . Azor [Amlodipine-Olmesartan] Cough  . Beta Adrenergic Blockers Other (See Comments)    Bradycardia  . Hydralazine Nausea And Vomiting and Other (See Comments)    Weakness- tolerating in 2021   . Milk-Related Compounds Other (See Comments) and Cough    STOPPED BY A MEDICAL PROFESSIONAL  . Other  Other (See Comments) and Cough    NUTS- ALL TYPES- (STOPPED BY A MEDICAL PROFESSIONAL)  . Peanut-Containing Drug Products Other (See Comments) and Cough    STOPPED BY A MEDICAL PROFESSIONAL  . Sulfamethoxazole-Trimethoprim Other (See Comments)    Patient cannot specifically recall the reaction  . Vitamin D Analogs Cough    Family History  Problem Relation Age of Onset  . Hypertension Mother   . Ulcers Father   . Cervical cancer Sister   . Throat cancer Brother   . Lung cancer Brother   . Cancer Brother   . Allergic rhinitis Neg Hx   . Angioedema Neg Hx   . Asthma Neg Hx   .  Atopy Neg Hx   . Eczema Neg Hx   . Immunodeficiency Neg Hx   . Urticaria Neg Hx     Prior to Admission medications   Medication Sig Start Date End Date Taking? Authorizing Provider  apixaban (ELIQUIS) 2.5 MG TABS tablet Take 1 tablet (2.5 mg total) by mouth 2 (two) times daily. 04/06/19   Burnell Blanks, MD  ARMOUR THYROID 15 MG tablet TAKE 1 TABLET BY MOUTH EVERY DAY Patient taking differently: Take 15 mg by mouth daily before breakfast.  03/30/19   [name], MD  Ascorbic Acid (VITAMIN C) 500 MG/5ML LIQD Take 1,500 mg by mouth daily.    [provider]  chlorthalidone (HYGROTON) 25 MG tablet Take 1 tablet (25 mg total) by mouth daily. 05/24/19   [name], MD  COSOPT 22.3-6.8 MG/ML ophthalmic solution Place 1 drop into both eyes 2 (two) times daily. Reported on 06/21/2015 04/24/14   [provider]  diltiazem (CARDIZEM CD) 120 MG 24 hr capsule Take 1 capsule (120 mg total) by mouth daily. 04/06/19   Burnell Blanks, MD  hydrALAZINE (APRESOLINE) 50 MG tablet Take 1 tablet (50 mg total) by mouth 3 (three) times daily. 09/27/19   [name], MD  irbesartan (AVAPRO) 300 MG tablet Take 1 tablet (300 mg total) by mouth daily. Patient taking differently: Take 150 mg by mouth in the morning and at bedtime.  04/13/19   [name], MD  Multiple Vitamins-Minerals (ALIVE MULTI-VITAMIN) LIQD Take 15 mLs by mouth daily.    [provider]  NON FORMULARY Take 1 Can by mouth See admin instructions. Dillard Essex Organic Nutrition Meal Replacement Drink or Protein Shake (Made Without Gluten, Soy, Dairy, or Corn)- Drink 1 can by mouth once daily    [provider]  rosuvastatin (CRESTOR) 5 MG tablet Take 1 tablet (5 mg total) by mouth once a week. 09/26/19   [name], MD    Physical Exam: Vitals:   10/11/19 1432 10/11/19 1447 10/11/19 1502 10/11/19 1517  BP: (!) 165/62 (!) 143/63 (!) 154/59 (!) 135/101  Pulse: 63 60 62 69  Resp:  (!) 27 (!) 29 (!) 26 (!) 22  Temp:      TempSrc:      SpO2: 100% 100% 100% 98%  Weight:      Height:        Constitutional: NAD Eyes: PERRL, lids and conjunctivae normal Neck: normal, supple Respiratory: Diffuse bilateral rhonchi mainly at the bases, no wheezing, no crackles.  Increased respiratory effort. Cardiovascular: Regular rate and rhythm, no murmurs / rubs / gallops. No extremity edema. 2+ pedal pulses.  Abdomen: no tenderness, no masses palpated. Bowel sounds positive.  Musculoskeletal: no clubbing / cyanosis. Normal muscle tone.  Skin: no rashes Neurologic:  CN 2-12 grossly intact. Strength 5/5 in all 4.  Psychiatric: Normal judgment and insight. Alert and oriented x 3. Normal mood.   Labs on Admission: I have personally reviewed following labs and imaging studies  CBC: Recent Labs  Lab 10/11/19 1330  WBC 5.8  NEUTROABS PENDING  HGB 11.4*  HCT 35.8*  MCV 86.9  PLT 010   Basic Metabolic Panel: Recent Labs  Lab 10/11/19 1330  NA 134*  K 4.2  CL 101  CO2 21*  GLUCOSE 146*  BUN 55*  CREATININE 1.54*  CALCIUM 8.9   Liver Function Tests: Recent Labs  Lab 10/11/19 1330  AST 86*  ALT 21  ALKPHOS 66  BILITOT 2.6*  PROT 7.1  ALBUMIN 3.3*   Coagulation Profile: No results for input(s): INR, PROTIME in the last 168 hours. BNP (last 3 results) No results for input(s): PROBNP in the last 8760 hours. CBG: No results for input(s): GLUCAP in the last 168 hours. Thyroid Function Tests: No results for input(s): TSH, T4TOTAL, FREET4, T3FREE, THYROIDAB in the last 72 hours. Urine analysis:    Component Value Date/Time   COLORURINE YELLOW 02/28/2013 1258   APPEARANCEUR CLEAR 02/28/2013 1258   LABSPEC 1.005 02/28/2013 1258   PHURINE 7.0 02/28/2013 1258   GLUCOSEU NEGATIVE 02/28/2013 1258   HGBUR TRACE (A) 02/28/2013 1258   BILIRUBINUR Negative 06/20/2019 1423   KETONESUR NEGATIVE 02/28/2013 1258   PROTEINUR Negative 06/20/2019 1423   PROTEINUR NEGATIVE  02/28/2013 1258   UROBILINOGEN 0.2 06/20/2019 1423   UROBILINOGEN 0.2 02/28/2013 1258   NITRITE Negative 06/20/2019 1423   NITRITE NEGATIVE 02/28/2013 1258   LEUKOCYTESUR Moderate (2+) (A) 06/20/2019 1423     Radiological Exams on Admission: DG Chest Port 1 View  Result Date: 10/11/2019 CLINICAL DATA:  Weakness shortness of breath for 1 week EXAM: PORTABLE CHEST 1 VIEW COMPARISON:  05/16/2019 FINDINGS: Right lower lobe and left lower lung hazy airspace disease. No pleural effusion or pneumothorax. Stable cardiomediastinal silhouette. No aggressive osseous lesion. IMPRESSION: Bilateral lower lobe hazy airspace disease concerning for pneumonia including atypical viral pneumonia. Electronically Signed   By: Kathreen Devoid   On: 10/11/2019 14:48    EKG: Independently reviewed.  Sinus rhythm  Assessment/Plan  Principal Problem Acute hypoxic respiratory failure due to COVID-19 pneumonia-Covid PCR returned positive.  Patient will be started on remdesivir along with steroids.  She is minimally hypoxic requiring 2 L nasal cannula satting 100% on that.  We will continue to closely monitor respiratory status.  Procalcitonin is mildly elevated, repeat in the morning, no clinical suspicion for bacterial pneumonia at this point  Active Problems Paroxysmal atrial fibrillation-EKG on admission shows sinus rhythm, continue calcium channel blockers as well as Eliquis.  Chronic kidney disease stage IIIb-her baseline creatinine varies between 1.2 and 1.5.  Currently at baseline at 1.5.  Essential hypertension-Per cardiology notes somewhat difficult to control blood pressure.  Her blood pressure currently is normal.  Continue diltiazem as needed for A. fib, hold hydralazine and irbesartan for now and resume if blood pressure requires  History of CVA-continue Eliquis  DVT prophylaxis: Eliquis  Code Status: Full code (patient deferred this discussion with family).  Son will discuss with his sister Family  Communication: Unable to reach daughter/POA, discussed with son over the phone Disposition Plan: TBD Bed Type: medical telemetry  Consults called: non  Obs/Inp: inpatient  At the time of admission, it appears that the appropriate admission status for this patient is INPATIENT as it is expected that patient  will require hospital care > 2 midnights. This is judged to be reasonable and necessary in order to provide the required intensity of service to ensure the patient's safety given: presenting symptoms, initial radiographic and laboratory data and in the context of their chronic comorbidities. Together, these circumstances are felt to place patient at high at high risk for further clinical deterioration threatening life, limb, or organ.  Marzetta Board, MD, PhD [facility]  Contact via [email or web address]  10/11/2019, 3:39 PM

## 2019-10-11 NOTE — ED Notes (Signed)
Daughter calls and states that this is the pt night time: Dorzolamide-hcl and timolol maleate eye drops are usually taken by pt.  22.3mg /6.8mg /ml

## 2019-10-11 NOTE — ED Triage Notes (Signed)
Pt arrives by [facility] with complaints of weakness that started 1 week ago. Pt found to have crackles w/ lung sounds and complains of SOB. Husband tested positive for COVID/PNA w/i the last week . EMS found pt at 90% Spo2 and placed on 4 L of o2 and improved to 100%.  BP-143/78 temp-97.7 CBG- 253 Resp-12 pulse-60 EKG- prolonged QT Solumedrol- 125 mg given PTA. IV L AC placed PTA.

## 2019-10-12 DIAGNOSIS — U071 COVID-19: Secondary | ICD-10-CM | POA: Diagnosis present

## 2019-10-12 LAB — CBC WITH DIFFERENTIAL/PLATELET
Abs Immature Granulocytes: 0.32 10*3/uL — ABNORMAL HIGH (ref 0.00–0.07)
Basophils Absolute: 0 10*3/uL (ref 0.0–0.1)
Basophils Relative: 0 %
Eosinophils Absolute: 0 10*3/uL (ref 0.0–0.5)
Eosinophils Relative: 0 %
HCT: 30.9 % — ABNORMAL LOW (ref 36.0–46.0)
Hemoglobin: 10.1 g/dL — ABNORMAL LOW (ref 12.0–15.0)
Immature Granulocytes: 3 %
Lymphocytes Relative: 5 %
Lymphs Abs: 0.7 10*3/uL (ref 0.7–4.0)
MCH: 27.9 pg (ref 26.0–34.0)
MCHC: 32.7 g/dL (ref 30.0–36.0)
MCV: 85.4 fL (ref 80.0–100.0)
Monocytes Absolute: 0.5 10*3/uL (ref 0.1–1.0)
Monocytes Relative: 4 %
Neutro Abs: 10.5 10*3/uL — ABNORMAL HIGH (ref 1.7–7.7)
Neutrophils Relative %: 88 %
Platelets: 358 10*3/uL (ref 150–400)
RBC: 3.62 MIL/uL — ABNORMAL LOW (ref 3.87–5.11)
RDW: 14 % (ref 11.5–15.5)
WBC: 12 10*3/uL — ABNORMAL HIGH (ref 4.0–10.5)
nRBC: 0.5 % — ABNORMAL HIGH (ref 0.0–0.2)

## 2019-10-12 LAB — COMPREHENSIVE METABOLIC PANEL
ALT: 15 U/L (ref 0–44)
AST: 87 U/L — ABNORMAL HIGH (ref 15–41)
Albumin: 2.8 g/dL — ABNORMAL LOW (ref 3.5–5.0)
Alkaline Phosphatase: 53 U/L (ref 38–126)
Anion gap: 14 (ref 5–15)
BUN: 63 mg/dL — ABNORMAL HIGH (ref 8–23)
CO2: 17 mmol/L — ABNORMAL LOW (ref 22–32)
Calcium: 8.6 mg/dL — ABNORMAL LOW (ref 8.9–10.3)
Chloride: 106 mmol/L (ref 98–111)
Creatinine, Ser: 1.61 mg/dL — ABNORMAL HIGH (ref 0.44–1.00)
GFR calc Af Amer: 32 mL/min — ABNORMAL LOW (ref >60)
GFR calc non Af Amer: 28 mL/min — ABNORMAL LOW (ref >60)
Glucose, Bld: 166 mg/dL — ABNORMAL HIGH (ref 70–99)
Potassium: 4.1 mmol/L (ref 3.5–5.1)
Sodium: 137 mmol/L (ref 135–145)
Total Bilirubin: 2.4 mg/dL — ABNORMAL HIGH (ref 0.3–1.2)
Total Protein: 6.4 g/dL — ABNORMAL LOW (ref 6.5–8.1)

## 2019-10-12 LAB — C-REACTIVE PROTEIN: CRP: 13.7 mg/dL — ABNORMAL HIGH (ref <1.0)

## 2019-10-12 LAB — D-DIMER, QUANTITATIVE: D-Dimer, Quant: 3.54 ug/mL-FEU — ABNORMAL HIGH (ref 0.00–0.50)

## 2019-10-12 LAB — BRAIN NATRIURETIC PEPTIDE: B Natriuretic Peptide: 407.7 pg/mL — ABNORMAL HIGH (ref 0.0–100.0)

## 2019-10-12 LAB — MAGNESIUM: Magnesium: 2.8 mg/dL — ABNORMAL HIGH (ref 1.7–2.4)

## 2019-10-12 MED ORDER — METHYLPREDNISOLONE SODIUM SUCC 40 MG IJ SOLR
40.0000 mg | Freq: Every day | INTRAMUSCULAR | Status: DC
  Administered 2019-10-12: 40 mg via INTRAVENOUS
  Filled 2019-10-12: qty 1

## 2019-10-12 NOTE — ED Notes (Signed)
Lav and SST tube sent to lab.

## 2019-10-12 NOTE — Progress Notes (Signed)
PROGRESS NOTE                                                                                                                                                                                                             Patient Demographics:    [name], is a 86 y.o. female, DOB - [birth year], QQP:[id number]  Outpatient Primary MD for the patient is [name], MD    LOS - 1  Admit date - 10/11/2019    CC - SOB     Brief Narrative  -   [name] is a 86 y.o. female with medical history significant of paroxysmal A. fib on Eliquis, prior CVA, PAD, HTN, HLD, unvaccinated for Covid and recent exposure to COVID-19 through her husband who had an infection was brought into the hospital for generalized weakness as well as mild shortness of breath.  She was diagnosed with COVID-19 pneumonia and admitted to the hospital.   Subjective:    [name] today has, No headache, No chest pain, No abdominal pain - No Nausea, No new weakness tingling or numbness, mild SOB.    Assessment  & Plan :     1. Acute Hypoxic Resp. Failure due to Acute Covid 19 Viral Pneumonitis during the ongoing 2020 Covid 19 Pandemic - she used to have moderate disease, she has been appropriately started on steroids and Remdesivir, continue to monitor closely.  Encouraged the patient to sit up in chair in the daytime use I-S and flutter valve for pulmonary toiletry and then prone in bed when at night.  Will advance activity and titrate down oxygen as possible.   SpO2: 95 % O2 Flow Rate (L/min): 4 L/min  Recent Labs  Lab 10/11/19 1330 10/11/19 1344 10/11/19 1348 10/12/19 0659  WBC 5.8  --   --   --   PLT 289  --   --   --   CRP 19.0*  --   --   --   AST 86*  --   --  87*  ALT 21  --   --  15  ALKPHOS 66  --   --  53  BILITOT 2.6*  --   --  2.4*  ALBUMIN 3.3*  --   --  2.8*  DDIMER 2.78*  --   --  3.54*  PROCALCITON  --  2.17  --   --    LATICACIDVEN  --  1.5  --   --   SARSCOV2NAA  --   --  POSITIVE*  --     2.  Underlying history of paroxysmal atrial fibrillation.  Mali vas 2 score of greater than 4.  Continue Eliquis and Cardizem.  3.  Hypothyroidism.  On thyroid supplementation which will be continued.  4.  Elevated D-dimer due to underlying inflammation.  Already on Eliquis.  Will monitor closely if uptrend continues will switch to Lovenox.  5.  CKD 3B.  Baseline creatinine around 1.5.  Monitor.  6.  HX of CVA.  Currently stable on Eliquis for secondary prevention.   Condition - Extremely Guarded  Family Communication  :  Daughter - [phone] - 10/12/19  Code Status :  Full  Consults  :  None  Procedures  :  None  PUD Prophylaxis : None  Disposition Plan  :    Status is: Inpatient  Remains inpatient appropriate because:IV treatments appropriate due to intensity of illness or inability to take PO   Dispo: The patient is from: Home              Anticipated d/c is to: Home              Anticipated d/c date is: > 3 days              Patient currently is not medically stable to d/c.   DVT Prophylaxis  :  Eliquis  Lab Results  Component Value Date   PLT 289 10/11/2019    Diet :  Diet Order            Diet regular Room service appropriate? Yes; Fluid consistency: Thin  Diet effective now                  Inpatient Medications  Scheduled Meds: . albuterol  2 puff Inhalation Q6H  . apixaban  2.5 mg Oral BID  . vitamin C  500 mg Oral Daily  . diltiazem  120 mg Oral Daily  . methylPREDNISolone (SOLU-MEDROL) injection  40 mg Intravenous Daily  . thyroid  15 mg Oral QAC breakfast  . zinc sulfate  220 mg Oral Daily   Continuous Infusions: . remdesivir 100 mg in NS 100 mL     PRN Meds:.acetaminophen, chlorpheniramine-HYDROcodone, guaiFENesin-dextromethorphan, [DISCONTINUED] ondansetron **OR** ondansetron (ZOFRAN) IV  Antibiotics  :    Anti-infectives (From admission, onward)    Start     Dose/Rate Route Frequency Ordered Stop   10/12/19 1000  remdesivir 100 mg in sodium chloride 0.9 % 100 mL IVPB       "Followed by" Linked Group Details   100 mg 200 mL/hr over 30 Minutes Intravenous Daily 10/11/19 1537 10/16/19 0959   10/11/19 1600  remdesivir 200 mg in sodium chloride 0.9% 250 mL IVPB       "Followed by" Linked Group Details   200 mg 580 mL/hr over 30 Minutes Intravenous Once 10/11/19 1537 10/11/19 1855       Time Spent in minutes  30   Lala Lund M.D on 10/12/2019 at 11:41 AM  To page go to [email or web address] - password [email or web address]  [facility] -  Office  [phone]    See all Orders from today for further details    Objective:   Vitals:   10/11/19 2100 10/11/19 2200 10/12/19 0048 10/12/19 0514  BP: (!) 171/61 (!) 169/72 (!) 152/65 (!) 154/71  Pulse:  70 70 68 70  Resp: 19 (!) 26 (!) 24 19  Temp: 97.9 F (36.6 C)  98 F (36.7 C) 97.8 F (36.6 C)  TempSrc: Oral  Oral Oral  SpO2: 97% 95% 98% 95%  Weight:      Height:        Wt Readings from Last 3 Encounters:  10/11/19 55 kg  09/26/19 55.2 kg  07/31/19 54.4 kg    No intake or output data in the 24 hours ending 10/12/19 1141   Physical Exam  Awake Alert, No new F.N deficits, Normal affect [name],[name] Supple Neck,No JVD, No cervical lymphadenopathy appriciated.  Symmetrical Chest wall movement, Good air movement bilaterally, CTAB RRR,No Gallops,Rubs or new Murmurs, No Parasternal Heave +ve B.Sounds, Abd Soft, No tenderness, No organomegaly appriciated, No rebound - guarding or rigidity. No Cyanosis, Clubbing or edema, No new Rash or bruise     Data Review:    CBC Recent Labs  Lab 10/11/19 1330  WBC 5.8  HGB 11.4*  HCT 35.8*  PLT 289  MCV 86.9  MCH 27.7  MCHC 31.8  RDW 14.0  LYMPHSABS 0.9  MONOABS 0.3  EOSABS 0.0  BASOSABS 0.0    Chemistries  Recent Labs  Lab 10/11/19 1330 10/12/19 0659  NA 134* 137  K 4.2 4.1  CL 101 106  CO2 21* 17*  GLUCOSE 146*  166*  BUN 55* 63*  CREATININE 1.54* 1.61*  CALCIUM 8.9 8.6*  AST 86* 87*  ALT 21 15  ALKPHOS 66 53  BILITOT 2.6* 2.4*  MG  --  2.8*     ------------------------------------------------------------------------------------------------------------------ Recent Labs    10/11/19 1330  TRIG 180*    Lab Results  Component Value Date   HGBA1C 6.2 10/11/2018   ------------------------------------------------------------------------------------------------------------------ No results for input(s): TSH, T4TOTAL, T3FREE, THYROIDAB in the last 72 hours.  Invalid input(s): FREET3  Cardiac Enzymes No results for input(s): CKMB, TROPONINI, MYOGLOBIN in the last 168 hours.  Invalid input(s): CK ------------------------------------------------------------------------------------------------------------------    Component Value Date/Time   BNP 407.7 (H) 10/12/2019 4782    Micro Results Recent Results (from the past 240 hour(s))  SARS Coronavirus 2 by RT PCR (hospital order, performed in [facility] hospital lab) Nasopharyngeal Nasopharyngeal Swab     Status: Abnormal   Collection Time: 10/11/19  1:48 PM   Specimen: Nasopharyngeal Swab  Result Value Ref Range Status   SARS Coronavirus 2 POSITIVE (A) NEGATIVE Final    Comment: RESULT CALLED TO, READ BACK BY AND VERIFIED WITH: M,FOUNTAIN @1631  10/11/19 EB (NOTE) SARS-CoV-2 target nucleic acids are DETECTED  SARS-CoV-2 RNA is generally detectable in upper respiratory specimens  during the acute phase of infection.  Positive results are indicative  of the presence of the identified virus, but do not rule out bacterial infection or co-infection with other pathogens not detected by the test.  Clinical correlation with patient history and  other diagnostic information is necessary to determine patient infection status.  The expected result is negative.  Fact Sheet for Patients:   StrictlyIdeas.no   Fact Sheet  for Healthcare Providers:   BankingDealers.co.za    This test is not yet approved or cleared by the [place] FDA and  has been authorized for detection and/or diagnosis of SARS-CoV-2 by FDA under an Emergency Use Authorization (EUA).  This EUA will remain in effect (meaning this test can  be used) for the duration of  the COVID-19 declaration under Section 564(b)(1) of the Act, 21 U.S.C. section 360-bbb-3(b)(1), unless the  authorization is terminated or revoked sooner.  Performed at [facility], [place] [address] [place], [place] [address]   Blood Culture (routine x 2)     Status: None (Preliminary result)   Collection Time: 10/11/19  1:50 PM   Specimen: BLOOD RIGHT FOREARM  Result Value Ref Range Status   Specimen Description BLOOD RIGHT FOREARM  Final   Special Requests   Final    BOTTLES DRAWN AEROBIC AND ANAEROBIC Blood Culture adequate volume   Culture   Final    NO GROWTH < 24 HOURS Performed at [facility], [place] [address], [place], [place] [address]    Report Status PENDING  Incomplete    Radiology Reports DG Chest Port 1 View  Result Date: 10/11/2019 CLINICAL DATA:  Weakness shortness of breath for 1 week EXAM: PORTABLE CHEST 1 VIEW COMPARISON:  05/16/2019 FINDINGS: Right lower lobe and left lower lung hazy airspace disease. No pleural effusion or pneumothorax. Stable cardiomediastinal silhouette. No aggressive osseous lesion. IMPRESSION: Bilateral lower lobe hazy airspace disease concerning for pneumonia including atypical viral pneumonia. Electronically Signed   By: Kathreen Devoid   On: 10/11/2019 14:48

## 2019-10-12 NOTE — ED Notes (Signed)
Ordered Breafast °

## 2019-10-12 NOTE — ED Notes (Signed)
Called pt dtr to update while in pt room. Family sent in personal items for pt which are in her room at this time

## 2019-10-12 NOTE — ED Notes (Addendum)
Per conversation with Dtr, Contacted attending to change to low/no sodium. Explained supplements to avoid gas attacks, eye drops and thyroid to oncoming RN. Pharm has been contacted to do a med rec regarding these items and may reach [facility] @  [phone].

## 2019-10-13 LAB — MAGNESIUM: Magnesium: 2.9 mg/dL — ABNORMAL HIGH (ref 1.7–2.4)

## 2019-10-13 LAB — OSMOLALITY: Osmolality: 327 mOsm/kg (ref 275–295)

## 2019-10-13 LAB — URINALYSIS, MICROSCOPIC (REFLEX)

## 2019-10-13 LAB — CBC WITH DIFFERENTIAL/PLATELET
Abs Immature Granulocytes: 0.41 10*3/uL — ABNORMAL HIGH (ref 0.00–0.07)
Basophils Absolute: 0 10*3/uL (ref 0.0–0.1)
Basophils Relative: 0 %
Eosinophils Absolute: 0 10*3/uL (ref 0.0–0.5)
Eosinophils Relative: 0 %
HCT: 32.4 % — ABNORMAL LOW (ref 36.0–46.0)
Hemoglobin: 10.4 g/dL — ABNORMAL LOW (ref 12.0–15.0)
Immature Granulocytes: 3 %
Lymphocytes Relative: 6 %
Lymphs Abs: 0.9 10*3/uL (ref 0.7–4.0)
MCH: 27.2 pg (ref 26.0–34.0)
MCHC: 32.1 g/dL (ref 30.0–36.0)
MCV: 84.8 fL (ref 80.0–100.0)
Monocytes Absolute: 0.7 10*3/uL (ref 0.1–1.0)
Monocytes Relative: 5 %
Neutro Abs: 12.6 10*3/uL — ABNORMAL HIGH (ref 1.7–7.7)
Neutrophils Relative %: 86 %
Platelets: 411 10*3/uL — ABNORMAL HIGH (ref 150–400)
RBC: 3.82 MIL/uL — ABNORMAL LOW (ref 3.87–5.11)
RDW: 14.5 % (ref 11.5–15.5)
WBC: 14.6 10*3/uL — ABNORMAL HIGH (ref 4.0–10.5)
nRBC: 0.5 % — ABNORMAL HIGH (ref 0.0–0.2)

## 2019-10-13 LAB — URINALYSIS, ROUTINE W REFLEX MICROSCOPIC
Bilirubin Urine: NEGATIVE
Glucose, UA: NEGATIVE mg/dL
Ketones, ur: NEGATIVE mg/dL
Leukocytes,Ua: NEGATIVE
Nitrite: NEGATIVE
Protein, ur: 100 mg/dL — AB
Specific Gravity, Urine: 1.02 (ref 1.005–1.030)
pH: 5.5 (ref 5.0–8.0)

## 2019-10-13 LAB — D-DIMER, QUANTITATIVE: D-Dimer, Quant: 2.19 ug/mL-FEU — ABNORMAL HIGH (ref 0.00–0.50)

## 2019-10-13 LAB — COMPREHENSIVE METABOLIC PANEL
ALT: 18 U/L (ref 0–44)
AST: 85 U/L — ABNORMAL HIGH (ref 15–41)
Albumin: 3.2 g/dL — ABNORMAL LOW (ref 3.5–5.0)
Alkaline Phosphatase: 58 U/L (ref 38–126)
Anion gap: 16 — ABNORMAL HIGH (ref 5–15)
BUN: 94 mg/dL — ABNORMAL HIGH (ref 8–23)
CO2: 19 mmol/L — ABNORMAL LOW (ref 22–32)
Calcium: 9.2 mg/dL (ref 8.9–10.3)
Chloride: 103 mmol/L (ref 98–111)
Creatinine, Ser: 2.43 mg/dL — ABNORMAL HIGH (ref 0.44–1.00)
GFR calc Af Amer: 19 mL/min — ABNORMAL LOW (ref >60)
GFR calc non Af Amer: 17 mL/min — ABNORMAL LOW (ref >60)
Glucose, Bld: 160 mg/dL — ABNORMAL HIGH (ref 70–99)
Potassium: 4.2 mmol/L (ref 3.5–5.1)
Sodium: 138 mmol/L (ref 135–145)
Total Bilirubin: 2.4 mg/dL — ABNORMAL HIGH (ref 0.3–1.2)
Total Protein: 6.9 g/dL (ref 6.5–8.1)

## 2019-10-13 LAB — C-REACTIVE PROTEIN: CRP: 12.2 mg/dL — ABNORMAL HIGH (ref <1.0)

## 2019-10-13 LAB — OSMOLALITY, URINE: Osmolality, Ur: 517 mOsm/kg (ref 300–900)

## 2019-10-13 LAB — CREATININE, URINE, RANDOM: Creatinine, Urine: 88.08 mg/dL

## 2019-10-13 LAB — BRAIN NATRIURETIC PEPTIDE: B Natriuretic Peptide: 367.6 pg/mL — ABNORMAL HIGH (ref 0.0–100.0)

## 2019-10-13 LAB — SODIUM, URINE, RANDOM: Sodium, Ur: 14 mmol/L

## 2019-10-13 MED ORDER — LACTATED RINGERS IV SOLN: INTRAVENOUS | Status: DC

## 2019-10-13 MED ORDER — LACTATED RINGERS IV SOLN: INTRAVENOUS | Status: AC

## 2019-10-13 MED ORDER — METHYLPREDNISOLONE SODIUM SUCC 40 MG IJ SOLR
30.0000 mg | Freq: Every day | INTRAMUSCULAR | Status: DC
  Administered 2019-10-13: 30 mg via INTRAVENOUS
  Filled 2019-10-13: qty 1

## 2019-10-13 MED ORDER — HYDRALAZINE HCL 25 MG PO TABS
25.0000 mg | ORAL_TABLET | Freq: Three times a day (TID) | ORAL | Status: DC
  Administered 2019-10-13 – 2019-10-16 (×8): 25 mg via ORAL
  Filled 2019-10-13 (×12): qty 1

## 2019-10-13 NOTE — ED Notes (Signed)
Pt assisted to chair, linen changed on bed, encouraged to used incentive spirometer, achieved 500ccx10

## 2019-10-13 NOTE — ED Notes (Signed)
Spoke to pt daughter via phone, updated

## 2019-10-13 NOTE — ED Notes (Addendum)
Pt assisted back to bed per pt request. Incentive spirometry repeated. [name]

## 2019-10-13 NOTE — ED Notes (Addendum)
Date and time results received: 10/13/19 1656  Test: Serum Osmo Critical Value: 327  Name of Provider Notified: Ronnie Derby MD  Orders Received? Or Actions Taken?: Awaiting New Orders

## 2019-10-13 NOTE — ED Notes (Signed)
Breakfast ordered 

## 2019-10-13 NOTE — Progress Notes (Signed)
PROGRESS NOTE                                                                                                                                                                                                             Patient Demographics:    [name], is a 86 y.o. female, DOB - August 19, 1925, WNI:[id number]  Outpatient Primary MD for the patient is [name], MD    LOS - 2  Admit date - 10/11/2019    CC - SOB     Brief Narrative  -   [name] is a 86 y.o. female with medical history significant of paroxysmal A. fib on Eliquis, prior CVA, PAD, HTN, HLD, unvaccinated for Covid and recent exposure to COVID-19 through her husband who had an infection was brought into the hospital for generalized weakness as well as mild shortness of breath.  She was diagnosed with COVID-19 pneumonia and admitted to the hospital.   Subjective:   Patient in bed, appears comfortable, denies any headache, no fever, no chest pain or pressure, no shortness of breath , no abdominal pain. No focal weakness.   Assessment  & Plan :     1. Acute Hypoxic Resp. Failure due to Acute Covid 19 Viral Pneumonitis during the ongoing 2020 Covid 19 Pandemic - she used to have moderate disease, she has been appropriately started on steroids and Remdesivir, continue to monitor closely.  Encouraged the patient to sit up in chair in the daytime use I-S and flutter valve for pulmonary toiletry and then prone in bed when at night.  Will advance activity and titrate down oxygen as possible.   SpO2: 90 % O2 Flow Rate (L/min): 2.5 L/min  Recent Labs  Lab 10/11/19 1330 10/11/19 1344 10/11/19 1348 10/12/19 0659 10/12/19 1933 10/13/19 0554  WBC 5.8  --   --   --  12.0* 14.6*  PLT 289  --   --   --  358 411*  CRP 19.0*  --   --   --  13.7* 12.2*  AST 86*  --   --  87*  --  85*  ALT 21  --   --  15  --  18  ALKPHOS 66  --   --  53  --  58  BILITOT  2.6*  --   --  2.4*  --  2.4*  ALBUMIN 3.3*  --   --  2.8*  --  3.2*  DDIMER 2.78*  --   --  3.54*  --  2.19*  PROCALCITON  --  2.17  --   --   --   --   LATICACIDVEN  --  1.5  --   --   --   --   SARSCOV2NAA  --   --  POSITIVE*  --   --   --     2.  Underlying history of paroxysmal atrial fibrillation.  Mali vas 2 score of greater than 4.  Continue Eliquis and Cardizem.  3.  Hypothyroidism.  On thyroid supplementation which will be continued.  4.  Elevated D-dimer due to underlying inflammation.  Already on Eliquis.  Will monitor closely if uptrend continues will switch to Lovenox.  5.  AKI on CKD 3B.  Baseline creatinine around 1.5.  Has a few crackles however clinically appears dehydrated, was on ARB at home which has been held, will check urine electrolytes along with gentle hydration, will also obtain a bladder scan.  Repeat BMP in the morning.  6.  HX of CVA.  Currently stable on Eliquis for secondary prevention.   Condition - Extremely Guarded  Family Communication  :  Daughter - [phone] - 10/12/19, 10/13/19  Code Status :  Full  Consults  :  None  Procedures  :  None  PUD Prophylaxis : None  Disposition Plan  :    Status is: Inpatient  Remains inpatient appropriate because:IV treatments appropriate due to intensity of illness or inability to take PO   Dispo: The patient is from: Home              Anticipated d/c is to: Home              Anticipated d/c date is: > 3 days              Patient currently is not medically stable to d/c.   DVT Prophylaxis  :  Eliquis  Lab Results  Component Value Date   PLT 411 (H) 10/13/2019    Diet :  Diet Order            Diet 2 gram sodium Room service appropriate? Yes; Fluid consistency: Thin  Diet effective now                  Inpatient Medications  Scheduled Meds: . albuterol  2 puff Inhalation Q6H  . apixaban  2.5 mg Oral BID  . vitamin C  500 mg Oral Daily  . diltiazem  120 mg Oral Daily  .  hydrALAZINE  25 mg Oral TID  . methylPREDNISolone (SOLU-MEDROL) injection  30 mg Intravenous Daily  . thyroid  15 mg Oral QAC breakfast  . zinc sulfate  220 mg Oral Daily   Continuous Infusions: . lactated ringers 100 mL/hr at 10/13/19 0830  . remdesivir 100 mg in NS 100 mL Stopped (10/12/19 1215)   PRN Meds:.acetaminophen, chlorpheniramine-HYDROcodone, guaiFENesin-dextromethorphan, [DISCONTINUED] ondansetron **OR** ondansetron (ZOFRAN) IV  Antibiotics  :    Anti-infectives (From admission, onward)   Start     Dose/Rate Route Frequency Ordered Stop   10/12/19 1000  remdesivir 100 mg in sodium chloride 0.9 % 100 mL IVPB       "Followed by" Linked Group Details   100 mg 200 mL/hr over 30 Minutes Intravenous Daily 10/11/19 1537 10/16/19 0959   10/11/19 1600  remdesivir 200 mg in sodium chloride 0.9% 250 mL IVPB       "Followed by" Linked Group Details   200 mg 580 mL/hr over 30 Minutes Intravenous Once 10/11/19 1537 10/11/19 1855       Time Spent in minutes  30   Lala Lund M.D on 10/13/2019 at 9:01 AM  To page go to [email or web address] - password [facility]  [facility] -  Office  [phone]    See all Orders from today for further details    Objective:   Vitals:   10/13/19 0700 10/13/19 0800 10/13/19 0815 10/13/19 0830  BP: (!) 133/46     Pulse: (!) 48  (!) 54 62  Resp: 17  (!) 21 (!) 29  Temp:      TempSrc:      SpO2: 94% 95% 90% 90%  Weight:      Height:        Wt Readings from Last 3 Encounters:  10/11/19 55 kg  09/26/19 55.2 kg  07/31/19 54.4 kg     Intake/Output Summary (Last 24 hours) at 10/13/2019 0901 Last data filed at 10/12/2019 1215 Gross per 24 hour  Intake 56.27 ml  Output --  Net 56.27 ml     Physical Exam  Awake Alert, No new F.N deficits, Normal affect [place].AT,PERRAL Supple Neck,No JVD, No cervical lymphadenopathy appriciated.  Symmetrical Chest wall movement, Good air movement bilaterally, few rales RRR,No Gallops, Rubs or new  Murmurs, No Parasternal Heave +ve B.Sounds, Abd Soft, No tenderness, No organomegaly appriciated, No rebound - guarding or rigidity. No Cyanosis, Clubbing or edema, No new Rash or bruise    Data Review:    CBC Recent Labs  Lab 10/11/19 1330 10/12/19 1933 10/13/19 0554  WBC 5.8 12.0* 14.6*  HGB 11.4* 10.1* 10.4*  HCT 35.8* 30.9* 32.4*  PLT 289 358 411*  MCV 86.9 85.4 84.8  MCH 27.7 27.9 27.2  MCHC 31.8 32.7 32.1  RDW 14.0 14.0 14.5  LYMPHSABS 0.9 0.7 0.9  MONOABS 0.3 0.5 0.7  EOSABS 0.0 0.0 0.0  BASOSABS 0.0 0.0 0.0    Chemistries  Recent Labs  Lab 10/11/19 1330 10/12/19 0659 10/13/19 0554  NA 134* 137 138  K 4.2 4.1 4.2  CL 101 106 103  CO2 21* 17* 19*  GLUCOSE 146* 166* 160*  BUN 55* 63* 94*  CREATININE 1.54* 1.61* 2.43*  CALCIUM 8.9 8.6* 9.2  AST 86* 87* 85*  ALT 21 15 18   ALKPHOS 66 53 58  BILITOT 2.6* 2.4* 2.4*  MG  --  2.8* 2.9*     ------------------------------------------------------------------------------------------------------------------ Recent Labs    10/11/19 1330  TRIG 180*    Lab Results  Component Value Date   HGBA1C 6.2 10/11/2018   ------------------------------------------------------------------------------------------------------------------ No results for input(s): TSH, T4TOTAL, T3FREE, THYROIDAB in the last 72 hours.  Invalid input(s): FREET3  Cardiac Enzymes No results for input(s): CKMB, TROPONINI, MYOGLOBIN in the last 168 hours.  Invalid input(s): CK ------------------------------------------------------------------------------------------------------------------    Component Value Date/Time   BNP 367.6 (H) 10/13/2019 0602    Micro Results Recent Results (from the past 240 hour(s))  SARS Coronavirus 2 by RT PCR (hospital order, performed in [facility] hospital lab) Nasopharyngeal Nasopharyngeal Swab     Status: Abnormal   Collection Time: 10/11/19  1:48 PM   Specimen: Nasopharyngeal Swab  Result Value Ref  Range Status   SARS Coronavirus 2 POSITIVE (A) NEGATIVE Final    Comment: RESULT CALLED TO, READ BACK BY AND VERIFIED WITH:  M,FOUNTAIN @1631  10/11/19 EB (NOTE) SARS-CoV-2 target nucleic acids are DETECTED  SARS-CoV-2 RNA is generally detectable in upper respiratory specimens  during the acute phase of infection.  Positive results are indicative  of the presence of the identified virus, but do not rule out bacterial infection or co-infection with other pathogens not detected by the test.  Clinical correlation with patient history and  other diagnostic information is necessary to determine patient infection status.  The expected result is negative.  Fact Sheet for Patients:   StrictlyIdeas.no   Fact Sheet for Healthcare Providers:   BankingDealers.co.za    This test is not yet approved or cleared by the [place] FDA and  has been authorized for detection and/or diagnosis of SARS-CoV-2 by FDA under an Emergency Use Authorization (EUA).  This EUA will remain in effect (meaning this test can  be used) for the duration of  the COVID-19 declaration under Section 564(b)(1) of the Act, 21 U.S.C. section 360-bbb-3(b)(1), unless the authorization is terminated or revoked sooner.  Performed at [facility], [place] [address] [place], [place] [address]   Blood Culture (routine x 2)     Status: None (Preliminary result)   Collection Time: 10/11/19  1:50 PM   Specimen: BLOOD RIGHT FOREARM  Result Value Ref Range Status   Specimen Description BLOOD RIGHT FOREARM  Final   Special Requests   Final    BOTTLES DRAWN AEROBIC AND ANAEROBIC Blood Culture adequate volume   Culture   Final    NO GROWTH 2 DAYS Performed at [facility], [place] [address] [place],  [address]    Report Status PENDING  Incomplete    Radiology Reports DG Chest Port 1 View  Result Date: 10/11/2019 CLINICAL DATA:  Weakness shortness of breath for 1  week EXAM: PORTABLE CHEST 1 VIEW COMPARISON:  05/16/2019 FINDINGS: Right lower lobe and left lower lung hazy airspace disease. No pleural effusion or pneumothorax. Stable cardiomediastinal silhouette. No aggressive osseous lesion. IMPRESSION: Bilateral lower lobe hazy airspace disease concerning for pneumonia including atypical viral pneumonia. Electronically Signed   By: Kathreen Devoid   On: 10/11/2019 14:48

## 2019-10-13 NOTE — ED Notes (Signed)
Pt sleeping and no distress noted. Will continue to monitor.

## 2019-10-14 ENCOUNTER — Inpatient Hospital Stay (HOSPITAL_COMMUNITY): Payer: Medicare Other

## 2019-10-14 LAB — CBC WITH DIFFERENTIAL/PLATELET
Abs Immature Granulocytes: 0.33 10*3/uL — ABNORMAL HIGH (ref 0.00–0.07)
Basophils Absolute: 0 10*3/uL (ref 0.0–0.1)
Basophils Relative: 0 %
Eosinophils Absolute: 0 10*3/uL (ref 0.0–0.5)
Eosinophils Relative: 0 %
HCT: 25 % — ABNORMAL LOW (ref 36.0–46.0)
Hemoglobin: 8.2 g/dL — ABNORMAL LOW (ref 12.0–15.0)
Immature Granulocytes: 2 %
Lymphocytes Relative: 4 %
Lymphs Abs: 0.6 10*3/uL — ABNORMAL LOW (ref 0.7–4.0)
MCH: 27.5 pg (ref 26.0–34.0)
MCHC: 32.8 g/dL (ref 30.0–36.0)
MCV: 83.9 fL (ref 80.0–100.0)
Monocytes Absolute: 0.9 10*3/uL (ref 0.1–1.0)
Monocytes Relative: 6 %
Neutro Abs: 12.8 10*3/uL — ABNORMAL HIGH (ref 1.7–7.7)
Neutrophils Relative %: 88 %
Platelets: 356 10*3/uL (ref 150–400)
RBC: 2.98 MIL/uL — ABNORMAL LOW (ref 3.87–5.11)
RDW: 14.5 % (ref 11.5–15.5)
WBC: 14.6 10*3/uL — ABNORMAL HIGH (ref 4.0–10.5)
nRBC: 0.8 % — ABNORMAL HIGH (ref 0.0–0.2)

## 2019-10-14 LAB — COMPREHENSIVE METABOLIC PANEL
ALT: 20 U/L (ref 0–44)
AST: 47 U/L — ABNORMAL HIGH (ref 15–41)
Albumin: 2.7 g/dL — ABNORMAL LOW (ref 3.5–5.0)
Alkaline Phosphatase: 54 U/L (ref 38–126)
Anion gap: 14 (ref 5–15)
BUN: 116 mg/dL — ABNORMAL HIGH (ref 8–23)
CO2: 17 mmol/L — ABNORMAL LOW (ref 22–32)
Calcium: 8.5 mg/dL — ABNORMAL LOW (ref 8.9–10.3)
Chloride: 106 mmol/L (ref 98–111)
Creatinine, Ser: 2.74 mg/dL — ABNORMAL HIGH (ref 0.44–1.00)
GFR calc Af Amer: 17 mL/min — ABNORMAL LOW (ref >60)
GFR calc non Af Amer: 15 mL/min — ABNORMAL LOW (ref >60)
Glucose, Bld: 161 mg/dL — ABNORMAL HIGH (ref 70–99)
Potassium: 4.4 mmol/L (ref 3.5–5.1)
Sodium: 137 mmol/L (ref 135–145)
Total Bilirubin: 1.2 mg/dL (ref 0.3–1.2)
Total Protein: 5.7 g/dL — ABNORMAL LOW (ref 6.5–8.1)

## 2019-10-14 LAB — MAGNESIUM: Magnesium: 2.7 mg/dL — ABNORMAL HIGH (ref 1.7–2.4)

## 2019-10-14 LAB — C-REACTIVE PROTEIN: CRP: 7.1 mg/dL — ABNORMAL HIGH (ref <1.0)

## 2019-10-14 LAB — APTT: aPTT: 42 seconds — ABNORMAL HIGH (ref 24–36)

## 2019-10-14 LAB — BRAIN NATRIURETIC PEPTIDE: B Natriuretic Peptide: 376.8 pg/mL — ABNORMAL HIGH (ref 0.0–100.0)

## 2019-10-14 LAB — URIC ACID: Uric Acid, Serum: 9.1 mg/dL — ABNORMAL HIGH (ref 2.5–7.1)

## 2019-10-14 LAB — D-DIMER, QUANTITATIVE: D-Dimer, Quant: 1.1 ug/mL-FEU — ABNORMAL HIGH (ref 0.00–0.50)

## 2019-10-14 MED ORDER — FUROSEMIDE 10 MG/ML IJ SOLN
40.0000 mg | Freq: Once | INTRAMUSCULAR | Status: AC
  Administered 2019-10-14: 40 mg via INTRAVENOUS
  Filled 2019-10-14: qty 4

## 2019-10-14 MED ORDER — ALBUTEROL SULFATE HFA 108 (90 BASE) MCG/ACT IN AERS
2.0000 | INHALATION_SPRAY | Freq: Three times a day (TID) | RESPIRATORY_TRACT | Status: DC
  Administered 2019-10-14 – 2019-10-18 (×12): 2 via RESPIRATORY_TRACT
  Filled 2019-10-14: qty 6.7

## 2019-10-14 MED ORDER — METHYLPREDNISOLONE SODIUM SUCC 40 MG IJ SOLR
20.0000 mg | Freq: Every day | INTRAMUSCULAR | Status: AC
  Administered 2019-10-14: 20 mg via INTRAVENOUS
  Filled 2019-10-14: qty 1

## 2019-10-14 MED ORDER — LACTATED RINGERS IV SOLN: INTRAVENOUS | Status: DC

## 2019-10-14 MED ORDER — HEPARIN (PORCINE) 25000 UT/250ML-% IV SOLN
700.0000 [IU]/h | INTRAVENOUS | Status: DC
  Administered 2019-10-14: 650 [IU]/h via INTRAVENOUS
  Filled 2019-10-14: qty 250

## 2019-10-14 MED ORDER — KATE FARMS STANDARD 1.4 PO LIQD
325.0000 mL | Freq: Three times a day (TID) | ORAL | Status: DC
  Administered 2019-10-14 – 2019-10-18 (×11): 325 mL via ORAL
  Filled 2019-10-14 (×13): qty 325

## 2019-10-14 MED ORDER — CHLORHEXIDINE GLUCONATE CLOTH 2 % EX PADS
6.0000 | MEDICATED_PAD | Freq: Every day | CUTANEOUS | Status: DC
  Administered 2019-10-14 – 2019-10-17 (×4): 6 via TOPICAL

## 2019-10-14 NOTE — Evaluation (Signed)
Physical Therapy Evaluation Patient Details Name: [name] MRN: [id number] DOB: [birth year] Today's Date: 10/14/2019   History of Present Illness  32 female presenting with generalized weakness as well as mild shortness of breath. Tested COVID-19 positive. Unvaccinated for COVID-19. PMH including paroxysmal A. fib on Eliquis, prior CVA, PAD, HTN, and HLD.  Clinical Impression   Pt admitted with above diagnosis. Prior to illness, she lived at home with her husband. He uses a rolling walker and she was independent. She is currently very limited in mobility due to decreased endurance and respiratory status. She required 2L O2 with sats dropping to 88% with walking only 12 ft. Pt currently with functional limitations due to the deficits listed below (see PT Problem List). Anticipate she could progress to home if she had other family members to assist her and her husband. Pt will benefit from skilled PT to increase their independence and safety with mobility to allow discharge to the venue listed below.       Follow Up Recommendations SNF;Supervision/Assistance - 24 hour (if family support, could go home with [facility])    Equipment Recommendations  Rolling walker with 5" wheels    Recommendations for Other Services       Precautions / Restrictions Precautions Precautions: Fall      Mobility  Bed Mobility               General bed mobility comments: up in chair; NT  Transfers Overall transfer level: Needs assistance Equipment used: Rolling walker (2 wheeled) Transfers: Sit to/from Stand Sit to Stand: Min assist         General transfer comment: definite need for UE assist via armrests; assist to initiate/lift-off  Ambulation/Gait Ambulation/Gait assistance: Min assist;+2 safety/equipment Gait Distance (Feet): 12 Feet Assistive device: Rolling walker (2 wheeled) Gait Pattern/deviations: Step-to pattern;Decreased stride length;Shuffle Gait velocity: very slow   General  Gait Details: very slow with incr work of breathing; +2 due to IV, O2, monitor  Stairs            Wheelchair Mobility    Modified Rankin (Stroke Patients Only)       Balance Overall balance assessment: Needs assistance         Standing balance support: Bilateral upper extremity supported Standing balance-Leahy Scale: Poor Standing balance comment: requiring bil UE support for static stance                             Pertinent Vitals/Pain Pain Assessment: Faces Faces Pain Scale: No hurt    Home Living Family/patient expects to be discharged to:: Private residence Living Arrangements: Spouse/significant other Available Help at Discharge: Family (spouse in ED with COVID) Type of Home: House Home Access: Stairs to enter Entrance Stairs-Rails: Psychiatric nurse of Steps: 2 Home Layout: One level Home Equipment: Bedside commode;Tub bench Additional Comments: husband uses a walker    Prior Function Level of Independence: Needs assistance   Gait / Transfers Assistance Needed: no device  ADL's / Homemaking Assistance Needed: husband has an aide 3x/week that helps with some chores  Comments: enjoys gardening     Hand Dominance        Extremity/Trunk Assessment   Upper Extremity Assessment Upper Extremity Assessment: Defer to OT evaluation    Lower Extremity Assessment Lower Extremity Assessment: Generalized weakness    Cervical / Trunk Assessment Cervical / Trunk Assessment: Normal  Communication   Communication: No difficulties  Cognition Arousal/Alertness: Awake/alert  Behavior During Therapy: Flat affect Overall Cognitive Status: Impaired/Different from baseline Area of Impairment: Problem solving                             Problem Solving: Slow processing;Difficulty sequencing;Requires verbal cues General Comments: pt reporting she cannot use her cellphone to call her family, "I just can't get it to work;  I don't know what's wrong with it" OT able to turn phone on and assist pt with calling famlly      General Comments General comments (skin integrity, edema, etc.): sats on 2L 88-98%; RR 20s-30s; HR max 106    Exercises Other Exercises Other Exercises: educated on proper use and frequency of IS and flutter valve; pt demonstrated correct use of each and then became nauseated and vomited. RN made aware and in to give meds   Assessment/Plan    PT Assessment Patient needs continued PT services  PT Problem List Decreased strength;Decreased activity tolerance;Decreased balance;Decreased mobility;Decreased cognition;Decreased knowledge of use of DME;Cardiopulmonary status limiting activity       PT Treatment Interventions DME instruction;Gait training;Functional mobility training;Therapeutic activities;Therapeutic exercise;Balance training;Cognitive remediation;Patient/family education    PT Goals (Current goals can be found in the Care Plan section)  Acute Rehab PT Goals Patient Stated Goal: get better PT Goal Formulation: Patient unable to participate in goal setting (vomiting) Time For Goal Achievement: 10/28/19 Potential to Achieve Goals: Good    Frequency Min 3X/week   Barriers to discharge Decreased caregiver support she helps care for her husband (he is also hospitalized with COVID)    Co-evaluation PT/OT/SLP Co-Evaluation/Treatment: Yes Reason for Co-Treatment: Complexity of the patient's impairments (multi-system involvement);Other (comment) (pt with limited endurance) PT goals addressed during session: Mobility/safety with mobility;Proper use of DME         AM-PAC PT "6 Clicks" Mobility  Outcome Measure Help needed turning from your back to your side while in a flat bed without using bedrails?: A Little Help needed moving from lying on your back to sitting on the side of a flat bed without using bedrails?: A Lot Help needed moving to and from a bed to a chair (including  a wheelchair)?: A Little Help needed standing up from a chair using your arms (e.g., wheelchair or bedside chair)?: A Little Help needed to walk in hospital room?: A Little Help needed climbing 3-5 steps with a railing? : Total 6 Click Score: 15    End of Session Equipment Utilized During Treatment: Oxygen Activity Tolerance: Patient limited by fatigue;Treatment limited secondary to medical complications (Comment) (cardiopulmonary status) Patient left: in chair;with call bell/phone within reach;with nursing/sitter in room Nurse Communication: Mobility status;Other (comment) (began vomiting at end of session) PT Visit Diagnosis: Muscle weakness (generalized) (M62.81);Difficulty in walking, not elsewhere classified (R26.2)    Time: 9480-1655 PT Time Calculation (min) (ACUTE ONLY): 33 min   Charges:   PT Evaluation $PT Eval Moderate Complexity: 1 Mod           Arby Barrette, PT Pager [phone]   Rexanne Mano 10/14/2019, 3:28 PM

## 2019-10-14 NOTE — Progress Notes (Signed)
Initial Nutrition Assessment  RD working remotely.  DOCUMENTATION CODES:   Not applicable  INTERVENTION:  Will downgrade diet to dysphagia 3 (mechanical soft).  Provide [employer] [place] Standard 1.4 Cal vanilla po TID between meals, each supplement provides 455 kcal and 20 grams of protein.  Provide MVI daily.  NUTRITION DIAGNOSIS:   Inadequate oral intake related to decreased appetite as evidenced by per patient/family report.  GOAL:   Patient will meet greater than or equal to 90% of their needs  MONITOR:   PO intake, Supplement acceptance, Labs, Weight trends, I & O's  REASON FOR ASSESSMENT:   Malnutrition Screening Tool    ASSESSMENT:   86 year old female with PMHx of HTN, paroxysmal A-fib, hypothyroidism, PAD, hx CVA, arthritis, glaucoma admitted with COVID-19 PNA, AKI.   Spoke with patient over the phone. She reports she has had a decreased appetite for approximately one week now. She is still attempting to eat 3 meals per day but is eating less at meals. Per chart patient ate 20% of her breakfast this morning. She reports the food is too difficult to chew and she needs her food cut up. Patient is amenable to RD downgrading diet to dysphagia 3. Patient reports she has been drinking oral nutrition supplements and prefers [employer] over Ensure/Boost as she does not eat/drink anything with milk-related compounds.  Patient reports her UBW was around 121 lbs and that is what she weighed at a recent appointment earlier this month. Noted weight of 55.2 kg on 09/26/2019. Patient is now 53.3 kg (117.51 lbs). She has lost 1.9 kg (3.4% body weight) sometime over the past 3 weeks, which is not quite significant for time frame but is concerning.  Medications reviewed and include: vitamin C 500 mg daily, zinc sulfate 220 mg daily, heparin gtt, remdesivir.  Labs reviewed: CO2 17, BUN 116, Creatinine 2.74.  Patient is at risk for malnutrition.  NUTRITION - FOCUSED PHYSICAL  EXAM:  Unable to complete as RD is working remotely.  Diet Order:   Diet Order            Diet 2 gram sodium Room service appropriate? Yes; Fluid consistency: Thin  Diet effective now                EDUCATION NEEDS:   No education needs have been identified at this time  Skin:  Skin Assessment: Reviewed RN Assessment  Last BM:  Unknown  Height:   Ht Readings from Last 1 Encounters:  10/13/19 5\' 3"  (1.6 m)   Weight:   Wt Readings from Last 1 Encounters:  10/14/19 53.3 kg   BMI:  Body mass index is 20.82 kg/m.  Estimated Nutritional Needs:   Kcal:  1500-1700  Protein:  75-85 grams  Fluid:  1.3-1.5 L/day  Jacklynn Barnacle, MS, RD, LDN Pager number available on Amion

## 2019-10-14 NOTE — Progress Notes (Signed)
Heparin drip was started at 6.5 ml/hr per order.

## 2019-10-14 NOTE — Progress Notes (Signed)
PT Cancellation Note  Patient Details Name: VERNEAL WIERS MRN: [id number] DOB: 1925-07-09   Cancelled Treatment:    Reason Eval/Treat Not Completed: Patient at procedure or test/unavailable  Patient just received her lunch. She was too weak to cut up her chicken and assisted her to open containers, etc. Patient is now trying to eat.   Arby Barrette, PT Pager [phone]  Rexanne Mano 10/14/2019, 1:27 PM

## 2019-10-14 NOTE — Progress Notes (Signed)
ANTICOAGULATION CONSULT NOTE Pharmacy Consult for heparin Indication: atrial fibrillation  Patient Measurements: Height: 5\' 3"  (160 cm) Weight: 53.3 kg (117 lb 8.1 oz) IBW/kg (Calculated) : 52.4 Heparin Dosing Weight: 53kg  Vital Signs: Temp: 97.9 F (36.6 C) (08/21 1200) Temp Source: Oral (08/21 1200) BP: 129/54 (08/21 1540) Pulse Rate: 68 (08/21 1540)  Labs: Recent Labs    10/12/19 0659 10/12/19 1933 10/12/19 1933 10/13/19 0554 10/14/19 0402 10/14/19 1854  HGB  --  10.1*   < > 10.4* 8.2*  --   HCT  --  30.9*  --  32.4* 25.0*  --   PLT  --  358  --  411* 356  --   APTT  --   --   --   --   --  42*  CREATININE 1.61*  --   --  2.43* 2.74*  --    < > = values in this interval not displayed.    Estimated Creatinine Clearance: 11.1 mL/min (A) (by C-G formula based on SCr of 2.74 mg/dL (H)).  Assessment: 63 yoF with hx AFib on apixaban PTA admitted with COVID-19 respiratory failure and AKI. Cr has trended up significantly from 1.5 on admit (~baseline) to 2.74 this morning. Given advanced age and low body weight as well, will hold apixaban for now and begin IV heparin. Last dose of apixaban was 2250 8/20.  Initial aPTT is subtherapeutic at 42. No bleeding or other issues noted.   Goal of Therapy:  Heparin level 0.3-0.7 units/ml aPTT 66-102 seconds Monitor platelets by anticoagulation protocol: Yes   Plan:  Increase heparin gtt to 800 units/hr Check an 8 hr aPTT Daily heparin level, aPTT and CBC  Salome Arnt, PharmD, BCPS Clinical Pharmacist Please see AMION for all pharmacy numbers 10/14/2019 7:49 PM

## 2019-10-14 NOTE — Progress Notes (Signed)
ANTICOAGULATION CONSULT NOTE - Initial Consult  Pharmacy Consult for heparin Indication: atrial fibrillation  Allergies  Allergen Reactions   Aceon [Perindopril Erbumine] Cough   Amlodipine Other (See Comments)    Lower extremity discomfort   Aspirin Other (See Comments) and Cough    STOPPED BY A MEDICAL PROFESSIONAL   Azor [Amlodipine-Olmesartan] Cough   Beta Adrenergic Blockers Other (See Comments)    Bradycardia   Hydralazine Nausea And Vomiting and Other (See Comments)    Weakness- tolerating in 2021    Milk-Related Compounds Other (See Comments) and Cough    STOPPED BY A MEDICAL PROFESSIONAL   Other Other (See Comments) and Cough    NUTS- ALL TYPES- (STOPPED BY A MEDICAL PROFESSIONAL) NO FOODS THAT CAUSE GAS (will cause A-Fib)   Peanut-Containing Drug Products Other (See Comments) and Cough    STOPPED BY A MEDICAL PROFESSIONAL   Sulfamethoxazole-Trimethoprim Other (See Comments)    Patient cannot specifically recall the reaction   Vitamin D Analogs Cough    Patient Measurements: Height: 5\' 3"  (160 cm) Weight: 53.3 kg (117 lb 8.1 oz) IBW/kg (Calculated) : 52.4 Heparin Dosing Weight: 53kg  Vital Signs: Temp: 97.7 F (36.5 C) (08/21 0559) Temp Source: Axillary (08/21 0559) BP: 125/55 (08/21 0559) Pulse Rate: 60 (08/21 0559)  Labs: Recent Labs    10/11/19 1330 10/11/19 1344 10/11/19 1749 10/12/19 0659 10/12/19 1933 10/12/19 1933 10/13/19 0554 10/14/19 0402  HGB   < >  --   --   --  10.1*   < > 10.4* 8.2*  HCT   < >  --   --   --  30.9*  --  32.4* 25.0*  PLT   < >  --   --   --  358  --  411* 356  CREATININE   < >  --   --  1.61*  --   --  2.43* 2.74*  TROPONINIHS  --  45* 40*  --   --   --   --   --    < > = values in this interval not displayed.    Estimated Creatinine Clearance: 11.1 mL/min (A) (by C-G formula based on SCr of 2.74 mg/dL (H)).   Medical History: Past Medical History:  Diagnosis Date   Arthritis    Chicken pox     Glaucoma of both eyes    High cholesterol    Hypertension    a. Normal renal arteries by duplex 2013.   Hypothyroidism    Migraine    PAD (peripheral artery disease) ([place])    a. 04/2013: s/p PCI to occluded right popliteal artery    PAF (paroxysmal atrial fibrillation) ([place])    a. s/p MAZE 2006. b. Event monitor 2013: NSR with PACs.   Sinus bradycardia    a. 03/2011 during hospital admission - beta blocker stopped.   Stroke [facility])    a. Pt reports 3-4 prior strokes without residual sx. b. CVA 2013 (presented with headache) - started on Plavix. Event monitor as outpt - no AF.    Assessment: 21 yoF with hx AFib on apixaban PTA admitted with COVID-19 respiratory failure and AKI. Cr has trended up significantly from 1.5 on admit (~baseline) to 2.74 this morning. Given advanced age and low body weight as well, will hold apixaban for now and begin IV heparin. Last dose of apixaban was 2250 8/20.  Goal of Therapy:  Heparin level 0.3-0.7 units/ml aPTT 66-102 seconds Monitor platelets by anticoagulation protocol: Yes  Plan:  Heparin 650 units/h starting at 1100 no bolus Check 8hr aPTT Daily heparin level, aPTT, CBC   Arrie Senate, PharmD, BCPS Clinical Pharmacist [phone] Please check AMION for all [facility] numbers 10/14/2019

## 2019-10-14 NOTE — Progress Notes (Addendum)
PROGRESS NOTE                                                                                                                                                                                                             Patient Demographics:    [name], is a 86 y.o. female, DOB - 02-11-1926, ZGY:[id number]  Outpatient Primary MD for the patient is [name], MD    LOS - 3  Admit date - 10/11/2019    CC - SOB     Brief Narrative  -   [name] is a 86 y.o. female with medical history significant of paroxysmal A. fib on Eliquis, prior CVA, PAD, HTN, HLD, unvaccinated for Covid and recent exposure to COVID-19 through her husband who had an infection was brought into the hospital for generalized weakness as well as mild shortness of breath.  She was diagnosed with COVID-19 pneumonia and admitted to the hospital.   Subjective:   Patient in bed, appears comfortable, denies any headache, no fever, no chest pain or pressure, she has problems laying flat and she is more shortof breath , no abdominal pain. No focal weakness.   Assessment  & Plan :     1. Acute Hypoxic Resp. Failure due to Acute Covid 19 Viral Pneumonitis during the ongoing 2020 Covid 19 Pandemic - she used to have moderate disease, she has been appropriately started on steroids and Remdesivir, continue to monitor closely.  Slightly worse on 10/14/2019 he has developed some orthopnea and more shortness of breath, have bumped her oxygen from 2-1/2 to 4 L.  Encouraged the patient to sit up in chair in the daytime use I-S and flutter valve for pulmonary toiletry and then prone in bed when at night.  Will advance activity and titrate down oxygen as possible.     Recent Labs  Lab 10/11/19 1330 10/11/19 1344 10/11/19 1348 10/12/19 0659 10/12/19 1933 10/13/19 0554 10/14/19 0402  WBC 5.8  --   --   --  12.0* 14.6* 14.6*  PLT 289  --   --   --  358 411*  356  CRP 19.0*  --   --   --  13.7* 12.2* 7.1*  AST 86*  --   --  87*  --  85* 47*  ALT 21  --   --  15  --  18 20  ALKPHOS 66  --   --  53  --  58 54  BILITOT 2.6*  --   --  2.4*  --  2.4* 1.2  ALBUMIN 3.3*  --   --  2.8*  --  3.2* 2.7*  DDIMER 2.78*  --   --  3.54*  --  2.19* 1.10*  PROCALCITON  --  2.17  --   --   --   --   --   LATICACIDVEN  --  1.5  --   --   --   --   --   SARSCOV2NAA  --   --  POSITIVE*  --   --   --   --     2.  Underlying history of paroxysmal atrial fibrillation.  Mali vas 2 score of greater than 4.  Continue Eliquis and Cardizem.  3.  Hypothyroidism.  On thyroid supplementation which will be continued.  4.  Elevated D-dimer due to underlying inflammation.  Already on Eliquis.  Will monitor closely if uptrend continues will switch to Lovenox.  5.  AKI on CKD 3B.  Baseline creatinine around 1.5.  Renal function worse with hydration although Roney Mans is less than 1, also developed orthopnea, more shortness of breath and coarse crackles on 10/14/2019, BNP elevated.  Challenge with Lasix and monitor.  6.  HX of CVA.  Currently stable on Eliquis for secondary prevention.   Condition - Extremely Guarded  Family Communication  :  Daughter - [phone] - 10/12/19, 10/13/19, 10/14/2019, she has been clearly told that prognosis is extremely guarded due the combination of her age and COVID-71 infection.  Code Status :  Full  Consults  :  None  Procedures  :  None  PUD Prophylaxis : None  Disposition Plan  :    Status is: Inpatient  Remains inpatient appropriate because:IV treatments appropriate due to intensity of illness or inability to take PO   Dispo: The patient is from: Home              Anticipated d/c is to: Home              Anticipated d/c date is: > 3 days              Patient currently is not medically stable to d/c.   DVT Prophylaxis  :  Eliquis  Lab Results  Component Value Date   PLT 356 10/14/2019    Diet :  Diet Order             Diet 2 gram sodium Room service appropriate? Yes; Fluid consistency: Thin  Diet effective now                  Inpatient Medications  Scheduled Meds: . albuterol  2 puff Inhalation Q6H  . vitamin C  500 mg Oral Daily  . diltiazem  120 mg Oral Daily  . hydrALAZINE  25 mg Oral TID  . thyroid  15 mg Oral QAC breakfast  . zinc sulfate  220 mg Oral Daily   Continuous Infusions: . heparin    . remdesivir 100 mg in NS 100 mL 100 mg (10/14/19 0931)   PRN Meds:.acetaminophen, chlorpheniramine-HYDROcodone, guaiFENesin-dextromethorphan, [DISCONTINUED] ondansetron **OR** ondansetron (ZOFRAN) IV  Antibiotics  :    Anti-infectives (From admission, onward)   Start     Dose/Rate Route Frequency Ordered Stop   10/12/19 1000  remdesivir 100 mg in sodium chloride 0.9 %  100 mL IVPB       "Followed by" Linked Group Details   100 mg 200 mL/hr over 30 Minutes Intravenous Daily 10/11/19 1537 10/16/19 0959   10/11/19 1600  remdesivir 200 mg in sodium chloride 0.9% 250 mL IVPB       "Followed by" Linked Group Details   200 mg 580 mL/hr over 30 Minutes Intravenous Once 10/11/19 1537 10/11/19 1855       Time Spent in minutes  30   Lala Lund M.D on 10/14/2019 at 10:15 AM  To page go to [email or web address] - password [email or web address]  [facility] -  Office  [phone]    See all Orders from today for further details    Objective:   Vitals:   10/14/19 0309 10/14/19 0327 10/14/19 0400 10/14/19 0559  BP:  (!) 114/53  (!) 125/55  Pulse: (!) 50 60 60 60  Resp: 16 18  18   Temp:  98 F (36.7 C) (!) 97.4 F (36.3 C) 97.7 F (36.5 C)  TempSrc:  Axillary Axillary Axillary  SpO2: 97% 95%  95%  Weight: 53.3 kg     Height:        Wt Readings from Last 3 Encounters:  10/14/19 53.3 kg  09/26/19 55.2 kg  07/31/19 54.4 kg     Intake/Output Summary (Last 24 hours) at 10/14/2019 1015 Last data filed at 10/14/2019 0956 Gross per 24 hour  Intake 698.74 ml  Output 1050 ml  Net -351.26 ml      Physical Exam  Awake Alert, No new F.N deficits,  [place].AT,PERRAL Supple Neck,No JVD, No cervical lymphadenopathy appriciated.  Symmetrical Chest wall movement, Good air movement bilaterally, ++ coarse Rales RRR,No Gallops, Rubs or new Murmurs, No Parasternal Heave +ve B.Sounds, Abd Soft, No tenderness, No organomegaly appriciated, No rebound - guarding or rigidity. No Cyanosis, Clubbing or edema, No new Rash or bruise    Data Review:    CBC Recent Labs  Lab 10/11/19 1330 10/12/19 1933 10/13/19 0554 10/14/19 0402  WBC 5.8 12.0* 14.6* 14.6*  HGB 11.4* 10.1* 10.4* 8.2*  HCT 35.8* 30.9* 32.4* 25.0*  PLT 289 358 411* 356  MCV 86.9 85.4 84.8 83.9  MCH 27.7 27.9 27.2 27.5  MCHC 31.8 32.7 32.1 32.8  RDW 14.0 14.0 14.5 14.5  LYMPHSABS 0.9 0.7 0.9 0.6*  MONOABS 0.3 0.5 0.7 0.9  EOSABS 0.0 0.0 0.0 0.0  BASOSABS 0.0 0.0 0.0 0.0    Recent Labs  Lab 10/11/19 1330 10/11/19 1344 10/12/19 0659 10/12/19 1933 10/13/19 0554 10/13/19 0602 10/14/19 0402  NA 134*  --  137  --  138  --  137  K 4.2  --  4.1  --  4.2  --  4.4  CL 101  --  106  --  103  --  106  CO2 21*  --  17*  --  19*  --  17*  GLUCOSE 146*  --  166*  --  160*  --  161*  BUN 55*  --  63*  --  94*  --  116*  CREATININE 1.54*  --  1.61*  --  2.43*  --  2.74*  CALCIUM 8.9  --  8.6*  --  9.2  --  8.5*  AST 86*  --  87*  --  85*  --  47*  ALT 21  --  15  --  18  --  20  ALKPHOS 66  --  53  --  58  --  54  BILITOT 2.6*  --  2.4*  --  2.4*  --  1.2  ALBUMIN 3.3*  --  2.8*  --  3.2*  --  2.7*  MG  --   --  2.8*  --  2.9*  --  2.7*  CRP 19.0*  --   --  13.7* 12.2*  --  7.1*  DDIMER 2.78*  --  3.54*  --  2.19*  --  1.10*  PROCALCITON  --  2.17  --   --   --   --   --   LATICACIDVEN  --  1.5  --   --   --   --   --   BNP  --   --  407.7*  --   --  367.6* 376.8*    Recent Labs  Lab 10/11/19 1330 10/11/19 1344 10/11/19 1348 10/12/19 0659 10/12/19 1933 10/13/19 0554 10/13/19 0602 10/14/19 0402  WBC 5.8  --    --   --  12.0* 14.6*  --  14.6*  CRP 19.0*  --   --   --  13.7* 12.2*  --  7.1*  DDIMER 2.78*  --   --  3.54*  --  2.19*  --  1.10*  BNP  --   --   --  407.7*  --   --  367.6* 376.8*  PROCALCITON  --  2.17  --   --   --   --   --   --   LATICACIDVEN  --  1.5  --   --   --   --   --   --   AST 86*  --   --  87*  --  85*  --  47*  ALT 21  --   --  15  --  18  --  20  ALKPHOS 66  --   --  53  --  58  --  54  BILITOT 2.6*  --   --  2.4*  --  2.4*  --  1.2  ALBUMIN 3.3*  --   --  2.8*  --  3.2*  --  2.7*  SARSCOV2NAA  --   --  POSITIVE*  --   --   --   --   --        ------------------------------------------------------------------------------------------------------------------ Recent Labs    10/11/19 1330  TRIG 180*    Lab Results  Component Value Date   HGBA1C 6.2 10/11/2018   ------------------------------------------------------------------------------------------------------------------ No results for input(s): TSH, T4TOTAL, T3FREE, THYROIDAB in the last 72 hours.  Invalid input(s): FREET3  Cardiac Enzymes No results for input(s): CKMB, TROPONINI, MYOGLOBIN in the last 168 hours.  Invalid input(s): CK ------------------------------------------------------------------------------------------------------------------    Component Value Date/Time   BNP 376.8 (H) 10/14/2019 0402    Micro Results Recent Results (from the past 240 hour(s))  SARS Coronavirus 2 by RT PCR (hospital order, performed in [facility] hospital lab) Nasopharyngeal Nasopharyngeal Swab     Status: Abnormal   Collection Time: 10/11/19  1:48 PM   Specimen: Nasopharyngeal Swab  Result Value Ref Range Status   SARS Coronavirus 2 POSITIVE (A) NEGATIVE Final    Comment: RESULT CALLED TO, READ BACK BY AND VERIFIED WITH: M,FOUNTAIN @1631  10/11/19 EB (NOTE) SARS-CoV-2 target nucleic acids are DETECTED  SARS-CoV-2 RNA is generally detectable in upper respiratory specimens  during the acute phase of  infection.  Positive results are indicative  of the presence of the identified virus,  but do not rule out bacterial infection or co-infection with other pathogens not detected by the test.  Clinical correlation with patient history and  other diagnostic information is necessary to determine patient infection status.  The expected result is negative.  Fact Sheet for Patients:   StrictlyIdeas.no   Fact Sheet for Healthcare Providers:   BankingDealers.co.za    This test is not yet approved or cleared by the [place] FDA and  has been authorized for detection and/or diagnosis of SARS-CoV-2 by FDA under an Emergency Use Authorization (EUA).  This EUA will remain in effect (meaning this test can  be used) for the duration of  the COVID-19 declaration under Section 564(b)(1) of the Act, 21 U.S.C. section 360-bbb-3(b)(1), unless the authorization is terminated or revoked sooner.  Performed at [facility], [place] [address] [place], [place] [address]   Blood Culture (routine x 2)     Status: None (Preliminary result)   Collection Time: 10/11/19  1:50 PM   Specimen: BLOOD RIGHT FOREARM  Result Value Ref Range Status   Specimen Description BLOOD RIGHT FOREARM  Final   Special Requests   Final    BOTTLES DRAWN AEROBIC AND ANAEROBIC Blood Culture adequate volume   Culture   Final    NO GROWTH 2 DAYS Performed at [facility], [place] [address] [place], [place] [address]    Report Status PENDING  Incomplete    Radiology Reports DG Chest Port 1 View  Result Date: 10/14/2019 CLINICAL DATA:  COVID pneumonia EXAM: PORTABLE CHEST 1 VIEW COMPARISON:  Three days ago FINDINGS: Improved lung volumes and improved patchy airspace disease. No effusion or pneumothorax. Normal heart size and mediastinal contours. IMPRESSION: Improved inflation and infiltrates. Electronically Signed   By: Monte Fantasia M.D.   On: 10/14/2019 10:04   DG Chest  Port 1 View  Result Date: 10/11/2019 CLINICAL DATA:  Weakness shortness of breath for 1 week EXAM: PORTABLE CHEST 1 VIEW COMPARISON:  05/16/2019 FINDINGS: Right lower lobe and left lower lung hazy airspace disease. No pleural effusion or pneumothorax. Stable cardiomediastinal silhouette. No aggressive osseous lesion. IMPRESSION: Bilateral lower lobe hazy airspace disease concerning for pneumonia including atypical viral pneumonia. Electronically Signed   By: Kathreen Devoid   On: 10/11/2019 14:48

## 2019-10-14 NOTE — Progress Notes (Addendum)
14 Fr Foley was inserted per MD verbal order. Bladder scan was done after patient voided - 370 cc urine retained. Patient tolerated well; after Foley insertion - 450 cc urine output.

## 2019-10-14 NOTE — Evaluation (Signed)
Occupational Therapy Evaluation Patient Details Name: [name] MRN: [id number] DOB: 1925-06-09 Today's Date: 10/14/2019    History of Present Illness 85 female presenting with generalized weakness as well as mild shortness of breath. Tested COVID-19 positive. Unvaccinated for COVID-19. PMH including paroxysmal A. fib on Eliquis, prior CVA, PAD, HTN, and HLD.   Clinical Impression   PTA, pt was living with her husband and performed ADLs and IADLs. Pt currently requiring Min A for UB ADLs, Mod A for LB ADLs, and Min A for functional mobility with RW. Pt presenting with poor activity tolerance and fatigues quickly. Pt with fatigue during self feeding task and requiring significant time and effort while eating lunch. SpO2 88-98% on 2L; RR 20s-30s; HR max 106. Pt would benefit from further acute OT to facilitate safe dc. Recommend dc to SNF for further OT to optimize safety, independence with ADLs, and return to PLOF.     Follow Up Recommendations  SNF;Supervision/Assistance - 24 hour (Pending progress)    Equipment Recommendations  Other (comment) (Defer to next venue)    Recommendations for Other Services PT consult     Precautions / Restrictions Precautions Precautions: Fall      Mobility Bed Mobility               General bed mobility comments: up in chair; NT  Transfers Overall transfer level: Needs assistance Equipment used: Rolling walker (2 wheeled) Transfers: Sit to/from Stand Sit to Stand: Min assist         General transfer comment: definite need for UE assist via armrests; assist to initiate/lift-off    Balance Overall balance assessment: Needs assistance Sitting-balance support: Feet supported;No upper extremity supported Sitting balance-Leahy Scale: Fair     Standing balance support: Bilateral upper extremity supported Standing balance-Leahy Scale: Poor Standing balance comment: requiring bil UE support for static stance                            ADL either performed or assessed with clinical judgement   ADL Overall ADL's : Needs assistance/impaired Eating/Feeding: Set up;Supervision/ safety;Sitting Eating/Feeding Details (indicate cue type and reason): Pt requiring significant time and effort with self feeding. Requiring rest break between bites of food. Fatigues quickly. Reporting she doesn't have an appetite but attmpeting to eat as mcuh as she can Grooming: Supervision/safety;Set up;Sitting   Upper Body Bathing: Minimal assistance;Sitting   Lower Body Bathing: Moderate assistance;Sit to/from stand   Upper Body Dressing : Minimal assistance;Sitting   Lower Body Dressing: Moderate assistance;Sit to/from stand   Toilet Transfer: Minimal assistance;Ambulation;RW (simulated to recliner)           Functional mobility during ADLs: Min guard;Minimal assistance;Rolling walker General ADL Comments: Decreased activity tolerance     Vision         Perception     Praxis      Pertinent Vitals/Pain Pain Assessment: Faces Faces Pain Scale: No hurt Pain Intervention(s): Monitored during session     Hand Dominance Right   Extremity/Trunk Assessment Upper Extremity Assessment Upper Extremity Assessment: Generalized weakness   Lower Extremity Assessment Lower Extremity Assessment: Defer to PT evaluation   Cervical / Trunk Assessment Cervical / Trunk Assessment: Normal   Communication Communication Communication: No difficulties   Cognition Arousal/Alertness: Awake/alert Behavior During Therapy: Flat affect Overall Cognitive Status: Impaired/Different from baseline Area of Impairment: Problem solving  Problem Solving: Slow processing;Difficulty sequencing;Requires verbal cues General Comments: pt reporting she cannot use her cellphone to call her family, "I just can't get it to work; I don't know what's wrong with it" OT able to turn phone on and assist pt with  calling famlly   General Comments  sats on 2L 88-98%; RR 20s-30s; HR max 106    Exercises Exercises: Other exercises Other Exercises Other Exercises: educated on proper use and frequency of IS and flutter valve; pt demonstrated correct use of each and then became nauseated and vomited. RN made aware and in to give meds   Shoulder Instructions      Home Living Family/patient expects to be discharged to:: Private residence Living Arrangements: Spouse/significant other Available Help at Discharge: Family (spouse in ED with COVID) Type of Home: House Home Access: Stairs to enter [employer] of Steps: 2 Entrance Stairs-Rails: Right;Left Home Layout: One level     Bathroom Shower/Tub: Occupational psychologist: [place]: Bedside commode;Tub bench   Additional Comments: husband uses a walker      Prior Functioning/Environment Level of Independence: Needs assistance  Gait / Transfers Assistance Needed: no device ADL's / Homemaking Assistance Needed: husband has an aide 3x/week that helps with some chores   Comments: enjoys gardening and coloring        OT Problem List: Decreased strength;Decreased range of motion;Decreased activity tolerance;Impaired balance (sitting and/or standing);Decreased knowledge of use of DME or AE;Decreased safety awareness;Decreased knowledge of precautions;Cardiopulmonary status limiting activity      OT Treatment/Interventions: Self-care/ADL training;Therapeutic exercise;Energy conservation;DME and/or AE instruction;Therapeutic activities;Patient/family education    OT Goals(Current goals can be found in the care plan section) Acute Rehab OT Goals Patient Stated Goal: get better OT Goal Formulation: With patient Time For Goal Achievement: 10/28/19 Potential to Achieve Goals: Good  OT Frequency: Min 2X/week   Barriers to D/C:            Co-evaluation PT/OT/SLP Co-Evaluation/Treatment: Yes Reason for  Co-Treatment: To address functional/ADL transfers;Complexity of the patient's impairments (multi-system involvement) (activity tolerance) PT goals addressed during session: Mobility/safety with mobility;Proper use of DME OT goals addressed during session: ADL's and self-care      AM-PAC OT "6 Clicks" Daily Activity     Outcome Measure Help from another person eating meals?: None Help from another person taking care of personal grooming?: A Little Help from another person toileting, which includes using toliet, bedpan, or urinal?: A Little Help from another person bathing (including washing, rinsing, drying)?: A Lot Help from another person to put on and taking off regular upper body clothing?: A Little Help from another person to put on and taking off regular lower body clothing?: A Lot 6 Click Score: 17   End of Session Equipment Utilized During Treatment: Oxygen (2L) Nurse Communication: Mobility status  Activity Tolerance: Patient limited by fatigue Patient left: in chair;with call bell/phone within reach  OT Visit Diagnosis: Unsteadiness on feet (R26.81);Other abnormalities of gait and mobility (R26.89);Muscle weakness (generalized) (M62.81)                Time: 0240-9735 OT Time Calculation (min): 47 min Charges:  OT General Charges $OT Visit: 1 Visit OT Evaluation $OT Eval Moderate Complexity: 1 Mod OT Treatments $Self Care/Home Management : 8-22 mins  Rayah Fines MSOT, OTR/L Acute Rehab Pager: [phone] Office: [place] 10/14/2019, 4:46 PM

## 2019-10-15 ENCOUNTER — Inpatient Hospital Stay (HOSPITAL_COMMUNITY): Payer: Medicare Other

## 2019-10-15 LAB — CBC WITH DIFFERENTIAL/PLATELET
Abs Immature Granulocytes: 0.38 10*3/uL — ABNORMAL HIGH (ref 0.00–0.07)
Basophils Absolute: 0 10*3/uL (ref 0.0–0.1)
Basophils Relative: 0 %
Eosinophils Absolute: 0 10*3/uL (ref 0.0–0.5)
Eosinophils Relative: 0 %
HCT: 23.3 % — ABNORMAL LOW (ref 36.0–46.0)
Hemoglobin: 7.6 g/dL — ABNORMAL LOW (ref 12.0–15.0)
Immature Granulocytes: 3 %
Lymphocytes Relative: 5 %
Lymphs Abs: 0.7 10*3/uL (ref 0.7–4.0)
MCH: 27 pg (ref 26.0–34.0)
MCHC: 32.6 g/dL (ref 30.0–36.0)
MCV: 82.6 fL (ref 80.0–100.0)
Monocytes Absolute: 1 10*3/uL (ref 0.1–1.0)
Monocytes Relative: 7 %
Neutro Abs: 12 10*3/uL — ABNORMAL HIGH (ref 1.7–7.7)
Neutrophils Relative %: 85 %
Platelets: 380 10*3/uL (ref 150–400)
RBC: 2.82 MIL/uL — ABNORMAL LOW (ref 3.87–5.11)
RDW: 14.5 % (ref 11.5–15.5)
WBC: 14.1 10*3/uL — ABNORMAL HIGH (ref 4.0–10.5)
nRBC: 0.9 % — ABNORMAL HIGH (ref 0.0–0.2)

## 2019-10-15 LAB — COMPREHENSIVE METABOLIC PANEL
ALT: 20 U/L (ref 0–44)
AST: 29 U/L (ref 15–41)
Albumin: 2.8 g/dL — ABNORMAL LOW (ref 3.5–5.0)
Alkaline Phosphatase: 54 U/L (ref 38–126)
Anion gap: 11 (ref 5–15)
BUN: 129 mg/dL — ABNORMAL HIGH (ref 8–23)
CO2: 20 mmol/L — ABNORMAL LOW (ref 22–32)
Calcium: 8.4 mg/dL — ABNORMAL LOW (ref 8.9–10.3)
Chloride: 103 mmol/L (ref 98–111)
Creatinine, Ser: 3.07 mg/dL — ABNORMAL HIGH (ref 0.44–1.00)
GFR calc Af Amer: 15 mL/min — ABNORMAL LOW (ref >60)
GFR calc non Af Amer: 13 mL/min — ABNORMAL LOW (ref >60)
Glucose, Bld: 160 mg/dL — ABNORMAL HIGH (ref 70–99)
Potassium: 4.5 mmol/L (ref 3.5–5.1)
Sodium: 134 mmol/L — ABNORMAL LOW (ref 135–145)
Total Bilirubin: 0.9 mg/dL (ref 0.3–1.2)
Total Protein: 5.7 g/dL — ABNORMAL LOW (ref 6.5–8.1)

## 2019-10-15 LAB — APTT
aPTT: 123 seconds — ABNORMAL HIGH (ref 24–36)
aPTT: 130 seconds — ABNORMAL HIGH (ref 24–36)

## 2019-10-15 LAB — HEPARIN LEVEL (UNFRACTIONATED): Heparin Unfractionated: >2.2 IU/mL — ABNORMAL HIGH (ref 0.30–0.70)

## 2019-10-15 LAB — BRAIN NATRIURETIC PEPTIDE: B Natriuretic Peptide: 368.3 pg/mL — ABNORMAL HIGH (ref 0.0–100.0)

## 2019-10-15 LAB — D-DIMER, QUANTITATIVE: D-Dimer, Quant: 0.81 ug/mL-FEU — ABNORMAL HIGH (ref 0.00–0.50)

## 2019-10-15 LAB — MAGNESIUM: Magnesium: 2.9 mg/dL — ABNORMAL HIGH (ref 1.7–2.4)

## 2019-10-15 LAB — C-REACTIVE PROTEIN: CRP: 5.4 mg/dL — ABNORMAL HIGH (ref <1.0)

## 2019-10-15 MED ORDER — HEPARIN (PORCINE) 25000 UT/250ML-% IV SOLN
600.0000 [IU]/h | INTRAVENOUS | Status: DC
  Administered 2019-10-15 (×2): 600 [IU]/h via INTRAVENOUS
  Filled 2019-10-15: qty 250

## 2019-10-15 MED ORDER — LACTATED RINGERS IV SOLN: INTRAVENOUS | Status: AC

## 2019-10-15 NOTE — Progress Notes (Signed)
PROGRESS NOTE                                                                                                                                                                                                             Patient Demographics:    [name], is a 86 y.o. female, DOB - 1925-08-12, ZOX:[id number]  Outpatient Primary MD for the patient is [name], MD    LOS - 4  Admit date - 10/11/2019    CC - SOB     Brief Narrative  -   [name] is a 86 y.o. female with medical history significant of paroxysmal A. fib on Eliquis, prior CVA, PAD, HTN, HLD, unvaccinated for Covid and recent exposure to COVID-19 through her husband who had an infection was brought into the hospital for generalized weakness as well as mild shortness of breath.  She was diagnosed with COVID-19 pneumonia and admitted to the hospital.   Subjective:   Patient in bed appears much comfortable, denies any headache, says her shortness of breath and problems laying flat have resolved, she feels a whole lot better, denies any abdominal pain no focal weakness..   Assessment  & Plan :     1. Acute Hypoxic Resp. Failure due to Acute Covid 19 Viral Pneumonitis during the ongoing 2020 Covid 19 Pandemic - she used to have moderate disease, she has been appropriately started on steroids and Remdesivir, continue to monitor closely.  He had clinically developed some fluid overload on 10/14/2019 with orthopnea, crackles and elevated BNP, this has improved after single dose Lasix on 10/14/2019.  We will continue to monitor closely.  Encouraged the patient to sit up in chair in the daytime use I-S and flutter valve for pulmonary toiletry and then prone in bed when at night.  Will advance activity and titrate down oxygen as possible.  SpO2: 93 % O2 Flow Rate (L/min): 2.5 L/min   Recent Labs  Lab 10/11/19 1330 10/11/19 1344 10/11/19 1348 10/12/19 0659  10/12/19 1933 10/13/19 0554 10/14/19 0402 10/15/19 0419  WBC 5.8  --   --   --  12.0* 14.6* 14.6* 14.1*  PLT 289  --   --   --  358 411* 356 380  CRP 19.0*  --   --   --  13.7* 12.2* 7.1* 5.4*  AST 86*  --   --  87*  --  85* 47* 29  ALT 21  --   --  15  --  18 20 20   ALKPHOS 66  --   --  53  --  58 54 54  BILITOT 2.6*  --   --  2.4*  --  2.4* 1.2 0.9  ALBUMIN 3.3*  --   --  2.8*  --  3.2* 2.7* 2.8*  DDIMER 2.78*  --   --  3.54*  --  2.19* 1.10* 0.81*  PROCALCITON  --  2.17  --   --   --   --   --   --   LATICACIDVEN  --  1.5  --   --   --   --   --   --   SARSCOV2NAA  --   --  POSITIVE*  --   --   --   --   --     2.  Underlying history of paroxysmal atrial fibrillation.  Mali vas 2 score of greater than 4.  Continue Eliquis and Cardizem.  3.  Hypothyroidism.  On thyroid supplementation which will be continued.  4.  Elevated D-dimer due to underlying inflammation.  Will monitor closely spite being on Eliquis currently on heparin due to renal dysfunction.  5.  AKI on CKD 3B.  Baseline creatinine around 1.5.  Renal function worse with hydration and also developed orthopnea, more shortness of breath and coarse crackles on 10/14/2019, BNP elevated.  Lasix pulmonary function has considerably improved on 10/15/2019, gentle hydration and monitor.  Currently has Foley due to letter up outlet obstruction, will check renal ultrasound to look at the kidneys.  6. Bladder outlet obstruction.  Foley for now.  7.  HX of CVA.  Currently stable on Eliquis for secondary prevention.     Condition - Extremely Guarded  Family Communication  :  Daughter Lunette Stands - [phone] - 10/12/19, 10/13/19, 10/14/2019, she has been clearly told that prognosis is extremely guarded due the combination of her age and COVID-35 infection. Updated -  10/15/2019.  Code Status :  Full  Consults  :  None  Procedures  :  None  PUD Prophylaxis : None  Disposition Plan  :    Status is: Inpatient  Remains inpatient  appropriate because:IV treatments appropriate due to intensity of illness or inability to take PO   Dispo: The patient is from: Home              Anticipated d/c is to: Home              Anticipated d/c date is: > 3 days              Patient currently is not medically stable to d/c.   DVT Prophylaxis  :  Eliquis  Lab Results  Component Value Date   PLT 380 10/15/2019    Diet :  Diet Order            DIET DYS 3 Room service appropriate? Yes; Fluid consistency: Thin  Diet effective now                  Inpatient Medications  Scheduled Meds: . albuterol  2 puff Inhalation TID  . vitamin C  500 mg Oral Daily  . Chlorhexidine Gluconate Cloth  6 each Topical Daily  . diltiazem  120 mg Oral Daily  . feeding supplement (KATE FARMS STANDARD 1.4)  325 mL  Oral TID BM  . hydrALAZINE  25 mg Oral TID  . thyroid  15 mg Oral QAC breakfast  . zinc sulfate  220 mg Oral Daily   Continuous Infusions: . heparin 700 Units/hr (10/15/19 0640)  . lactated ringers 75 mL/hr at 10/15/19 0825  . remdesivir 100 mg in NS 100 mL 100 mg (10/14/19 0931)   PRN Meds:.acetaminophen, chlorpheniramine-HYDROcodone, guaiFENesin-dextromethorphan, [DISCONTINUED] ondansetron **OR** ondansetron (ZOFRAN) IV  Antibiotics  :    Anti-infectives (From admission, onward)   Start     Dose/Rate Route Frequency Ordered Stop   10/12/19 1000  remdesivir 100 mg in sodium chloride 0.9 % 100 mL IVPB       "Followed by" Linked Group Details   100 mg 200 mL/hr over 30 Minutes Intravenous Daily 10/11/19 1537 10/16/19 0959   10/11/19 1600  remdesivir 200 mg in sodium chloride 0.9% 250 mL IVPB       "Followed by" Linked Group Details   200 mg 580 mL/hr over 30 Minutes Intravenous Once 10/11/19 1537 10/11/19 1855       Time Spent in minutes  30   Lala Lund M.D on 10/15/2019 at 9:43 AM  To page go to [email or web address] - password [place]  [facility] -  Office  [phone]    See all Orders from today for  further details    Objective:   Vitals:   10/14/19 1200 10/14/19 1540 10/14/19 2116 10/15/19 0515  BP: (!) 121/52 (!) 129/54 125/61 116/70  Pulse: 66 68 71 62  Resp: 17 19 15 17   Temp: 97.9 F (36.6 C)  97.6 F (36.4 C) 97.8 F (36.6 C)  TempSrc: Oral  Oral Oral  SpO2: 97% 91% 95% 93%  Weight:      Height:        Wt Readings from Last 3 Encounters:  10/14/19 53.3 kg  09/26/19 55.2 kg  07/31/19 54.4 kg     Intake/Output Summary (Last 24 hours) at 10/15/2019 0943 Last data filed at 10/15/2019 6712 Gross per 24 hour  Intake 265.07 ml  Output 1100 ml  Net -834.93 ml     Physical Exam  Awake Alert, No new F.N deficits, Normal affect [place].AT,PERRAL Supple Neck,No JVD, No cervical lymphadenopathy appriciated.  Symmetrical Chest wall movement, Good air movement bilaterally, much improved rales RRR,No Gallops, Rubs or new Murmurs, No Parasternal Heave +ve B.Sounds, Abd Soft, No tenderness, No organomegaly appriciated, No rebound - guarding or rigidity. No Cyanosis, Clubbing or edema, No new Rash or bruise     Data Review:    CBC Recent Labs  Lab 10/11/19 1330 10/12/19 1933 10/13/19 0554 10/14/19 0402 10/15/19 0419  WBC 5.8 12.0* 14.6* 14.6* 14.1*  HGB 11.4* 10.1* 10.4* 8.2* 7.6*  HCT 35.8* 30.9* 32.4* 25.0* 23.3*  PLT 289 358 411* 356 380  MCV 86.9 85.4 84.8 83.9 82.6  MCH 27.7 27.9 27.2 27.5 27.0  MCHC 31.8 32.7 32.1 32.8 32.6  RDW 14.0 14.0 14.5 14.5 14.5  LYMPHSABS 0.9 0.7 0.9 0.6* 0.7  MONOABS 0.3 0.5 0.7 0.9 1.0  EOSABS 0.0 0.0 0.0 0.0 0.0  BASOSABS 0.0 0.0 0.0 0.0 0.0    Recent Labs  Lab 10/11/19 1330 10/11/19 1344 10/12/19 0659 10/12/19 1933 10/13/19 0554 10/13/19 0602 10/14/19 0402 10/15/19 0419  NA 134*  --  137  --  138  --  137 134*  K 4.2  --  4.1  --  4.2  --  4.4 4.5  CL 101  --  106  --  103  --  106 103  CO2 21*  --  17*  --  19*  --  17* 20*  GLUCOSE 146*  --  166*  --  160*  --  161* 160*  BUN 55*  --  63*  --  94*  --  116*  129*  CREATININE 1.54*  --  1.61*  --  2.43*  --  2.74* 3.07*  CALCIUM 8.9  --  8.6*  --  9.2  --  8.5* 8.4*  AST 86*  --  87*  --  85*  --  47* 29  ALT 21  --  15  --  18  --  20 20  ALKPHOS 66  --  53  --  58  --  54 54  BILITOT 2.6*  --  2.4*  --  2.4*  --  1.2 0.9  ALBUMIN 3.3*  --  2.8*  --  3.2*  --  2.7* 2.8*  MG  --   --  2.8*  --  2.9*  --  2.7* 2.9*  CRP 19.0*  --   --  13.7* 12.2*  --  7.1* 5.4*  DDIMER 2.78*  --  3.54*  --  2.19*  --  1.10* 0.81*  PROCALCITON  --  2.17  --   --   --   --   --   --   LATICACIDVEN  --  1.5  --   --   --   --   --   --   BNP  --   --  407.7*  --   --  367.6* 376.8* 368.3*    Recent Labs  Lab 10/11/19 1330 10/11/19 1344 10/11/19 1348 10/12/19 0659 10/12/19 1933 10/13/19 0554 10/13/19 0602 10/14/19 0402 10/15/19 0419  WBC 5.8  --   --   --  12.0* 14.6*  --  14.6* 14.1*  CRP 19.0*  --   --   --  13.7* 12.2*  --  7.1* 5.4*  DDIMER 2.78*  --   --  3.54*  --  2.19*  --  1.10* 0.81*  BNP  --   --   --  407.7*  --   --  367.6* 376.8* 368.3*  PROCALCITON  --  2.17  --   --   --   --   --   --   --   LATICACIDVEN  --  1.5  --   --   --   --   --   --   --   AST 86*  --   --  87*  --  85*  --  47* 29  ALT 21  --   --  15  --  18  --  20 20  ALKPHOS 66  --   --  53  --  58  --  54 54  BILITOT 2.6*  --   --  2.4*  --  2.4*  --  1.2 0.9  ALBUMIN 3.3*  --   --  2.8*  --  3.2*  --  2.7* 2.8*  SARSCOV2NAA  --   --  POSITIVE*  --   --   --   --   --   --        ------------------------------------------------------------------------------------------------------------------ No results for input(s): CHOL, HDL, LDLCALC, TRIG, CHOLHDL, LDLDIRECT in the last 72 hours.  Lab Results  Component Value Date   HGBA1C 6.2 10/11/2018   ------------------------------------------------------------------------------------------------------------------ No  results for input(s): TSH, T4TOTAL, T3FREE, THYROIDAB in the last 72 hours.  Invalid input(s):  FREET3  Cardiac Enzymes No results for input(s): CKMB, TROPONINI, MYOGLOBIN in the last 168 hours.  Invalid input(s): CK ------------------------------------------------------------------------------------------------------------------    Component Value Date/Time   BNP 368.3 (H) 10/15/2019 0419    Micro Results Recent Results (from the past 240 hour(s))  SARS Coronavirus 2 by RT PCR (hospital order, performed in [facility] hospital lab) Nasopharyngeal Nasopharyngeal Swab     Status: Abnormal   Collection Time: 10/11/19  1:48 PM   Specimen: Nasopharyngeal Swab  Result Value Ref Range Status   SARS Coronavirus 2 POSITIVE (A) NEGATIVE Final    Comment: RESULT CALLED TO, READ BACK BY AND VERIFIED WITH: M,FOUNTAIN @1631  10/11/19 EB (NOTE) SARS-CoV-2 target nucleic acids are DETECTED  SARS-CoV-2 RNA is generally detectable in upper respiratory specimens  during the acute phase of infection.  Positive results are indicative  of the presence of the identified virus, but do not rule out bacterial infection or co-infection with other pathogens not detected by the test.  Clinical correlation with patient history and  other diagnostic information is necessary to determine patient infection status.  The expected result is negative.  Fact Sheet for Patients:   StrictlyIdeas.no   Fact Sheet for Healthcare Providers:   BankingDealers.co.za    This test is not yet approved or cleared by the [place] FDA and  has been authorized for detection and/or diagnosis of SARS-CoV-2 by FDA under an Emergency Use Authorization (EUA).  This EUA will remain in effect (meaning this test can  be used) for the duration of  the COVID-19 declaration under Section 564(b)(1) of the Act, 21 U.S.C. section 360-bbb-3(b)(1), unless the authorization is terminated or revoked sooner.  Performed at [facility], [place] [address], [place], [place] [address]    Blood Culture (routine x 2)     Status: None (Preliminary result)   Collection Time: 10/11/19  1:50 PM   Specimen: BLOOD RIGHT FOREARM  Result Value Ref Range Status   Specimen Description BLOOD RIGHT FOREARM  Final   Special Requests   Final    BOTTLES DRAWN AEROBIC AND ANAEROBIC Blood Culture adequate volume   Culture   Final    NO GROWTH 3 DAYS Performed at [facility], [place] [address], [place], [place] [address]    Report Status PENDING  Incomplete    Radiology Reports DG Chest Port 1 View  Result Date: 10/14/2019 CLINICAL DATA:  COVID pneumonia EXAM: PORTABLE CHEST 1 VIEW COMPARISON:  Three days ago FINDINGS: Improved lung volumes and improved patchy airspace disease. No effusion or pneumothorax. Normal heart size and mediastinal contours. IMPRESSION: Improved inflation and infiltrates. Electronically Signed   By: Monte Fantasia M.D.   On: 10/14/2019 10:04   DG Chest Port 1 View  Result Date: 10/11/2019 CLINICAL DATA:  Weakness shortness of breath for 1 week EXAM: PORTABLE CHEST 1 VIEW COMPARISON:  05/16/2019 FINDINGS: Right lower lobe and left lower lung hazy airspace disease. No pleural effusion or pneumothorax. Stable cardiomediastinal silhouette. No aggressive osseous lesion. IMPRESSION: Bilateral lower lobe hazy airspace disease concerning for pneumonia including atypical viral pneumonia. Electronically Signed   By: Kathreen Devoid   On: 10/11/2019 14:48

## 2019-10-15 NOTE — Progress Notes (Signed)
ANTICOAGULATION CONSULT NOTE - Follow Up Consult  Pharmacy Consult for heparin Indication: atrial fibrillation  Labs: Recent Labs    10/13/19 0554 10/13/19 0554 10/14/19 0402 10/14/19 1854 10/15/19 0419  HGB 10.4*   < > 8.2*  --  7.6*  HCT 32.4*  --  25.0*  --  23.3*  PLT 411*  --  356  --  380  APTT  --   --   --  42* 123*  CREATININE 2.43*  --  2.74*  --  3.07*   < > = values in this interval not displayed.    Assessment: 86yo female supratherapeutic on heparin after rate change; no gtt issues or signs of bleeding per RN.  Goal of Therapy:  aPTT 66-102 seconds   Plan:  Will decrease heparin gtt by 2 units/kg/hr to 700 units/hr and check PTT in 8 hours.    Wynona Neat, PharmD, BCPS  10/15/2019,6:36 AM

## 2019-10-15 NOTE — Progress Notes (Signed)
Heparin drip was restarted at 6 ml/hr per pharmacy.

## 2019-10-15 NOTE — Progress Notes (Signed)
Heparin drip was stopped per pharmacy.

## 2019-10-15 NOTE — Progress Notes (Signed)
ANTICOAGULATION CONSULT NOTE  Pharmacy Consult for heparin Indication: atrial fibrillation  Patient Measurements: Height: 5\' 3"  (160 cm) Weight: 53.3 kg (117 lb 8.1 oz) IBW/kg (Calculated) : 52.4 Heparin Dosing Weight: 53kg  Vital Signs: Temp: 97.8 F (36.6 C) (08/22 0515) Temp Source: Oral (08/22 0515) BP: 116/70 (08/22 0515) Pulse Rate: 62 (08/22 0515)  Labs: Recent Labs    10/13/19 0554 10/13/19 0554 10/14/19 0402 10/14/19 1854 10/15/19 0419 10/15/19 1448  HGB 10.4*   < > 8.2*  --  7.6*  --   HCT 32.4*  --  25.0*  --  23.3*  --   PLT 411*  --  356  --  380  --   APTT  --   --   --  42* 123* 130*  HEPARINUNFRC  --   --   --   --  >2.20*  --   CREATININE 2.43*  --  2.74*  --  3.07*  --    < > = values in this interval not displayed.    Estimated Creatinine Clearance: 9.9 mL/min (A) (by C-G formula based on SCr of 3.07 mg/dL (H)).  Assessment: 56 yoF with hx AFib on apixaban PTA admitted with COVID-19 respiratory failure and AKI. Cr has trended up significantly from 1.5 on admit (~baseline) to 3.07 this morning. Given advanced age and low body weight as well, will hold apixaban for now and begin IV heparin. Last dose of apixaban was 2250 8/20.  APTT remains elevated and has increased despite rate reduction. No bleeding noted.   Goal of Therapy:  Heparin level 0.3-0.7 units/ml aPTT 66-102 seconds Monitor platelets by anticoagulation protocol: Yes   Plan:  Hold heparin x 30 minutes then resume at 600 units/hr Check an 8 hr aPTT Daily heparin level, aPTT and CBC  Salome Arnt, PharmD, BCPS Clinical Pharmacist Please see AMION for all pharmacy numbers 10/15/2019 4:22 PM

## 2019-10-16 LAB — COMPREHENSIVE METABOLIC PANEL
ALT: 21 U/L (ref 0–44)
AST: 24 U/L (ref 15–41)
Albumin: 2.4 g/dL — ABNORMAL LOW (ref 3.5–5.0)
Alkaline Phosphatase: 49 U/L (ref 38–126)
Anion gap: 13 (ref 5–15)
BUN: 125 mg/dL — ABNORMAL HIGH (ref 8–23)
CO2: 20 mmol/L — ABNORMAL LOW (ref 22–32)
Calcium: 8.3 mg/dL — ABNORMAL LOW (ref 8.9–10.3)
Chloride: 106 mmol/L (ref 98–111)
Creatinine, Ser: 2.97 mg/dL — ABNORMAL HIGH (ref 0.44–1.00)
GFR calc Af Amer: 15 mL/min — ABNORMAL LOW (ref >60)
GFR calc non Af Amer: 13 mL/min — ABNORMAL LOW (ref >60)
Glucose, Bld: 98 mg/dL (ref 70–99)
Potassium: 4.1 mmol/L (ref 3.5–5.1)
Sodium: 139 mmol/L (ref 135–145)
Total Bilirubin: 0.9 mg/dL (ref 0.3–1.2)
Total Protein: 5.1 g/dL — ABNORMAL LOW (ref 6.5–8.1)

## 2019-10-16 LAB — CBC WITH DIFFERENTIAL/PLATELET
Abs Immature Granulocytes: 0.32 10*3/uL — ABNORMAL HIGH (ref 0.00–0.07)
Basophils Absolute: 0 10*3/uL (ref 0.0–0.1)
Basophils Relative: 0 %
Eosinophils Absolute: 0 10*3/uL (ref 0.0–0.5)
Eosinophils Relative: 0 %
HCT: 22 % — ABNORMAL LOW (ref 36.0–46.0)
Hemoglobin: 7.4 g/dL — ABNORMAL LOW (ref 12.0–15.0)
Immature Granulocytes: 3 %
Lymphocytes Relative: 8 %
Lymphs Abs: 1 10*3/uL (ref 0.7–4.0)
MCH: 28 pg (ref 26.0–34.0)
MCHC: 33.6 g/dL (ref 30.0–36.0)
MCV: 83.3 fL (ref 80.0–100.0)
Monocytes Absolute: 1.2 10*3/uL — ABNORMAL HIGH (ref 0.1–1.0)
Monocytes Relative: 9 %
Neutro Abs: 9.9 10*3/uL — ABNORMAL HIGH (ref 1.7–7.7)
Neutrophils Relative %: 80 %
Platelets: 399 10*3/uL (ref 150–400)
RBC: 2.64 MIL/uL — ABNORMAL LOW (ref 3.87–5.11)
RDW: 14.6 % (ref 11.5–15.5)
WBC: 12.3 10*3/uL — ABNORMAL HIGH (ref 4.0–10.5)
nRBC: 0.6 % — ABNORMAL HIGH (ref 0.0–0.2)

## 2019-10-16 LAB — C-REACTIVE PROTEIN: CRP: 4.3 mg/dL — ABNORMAL HIGH (ref <1.0)

## 2019-10-16 LAB — APTT
aPTT: 134 seconds — ABNORMAL HIGH (ref 24–36)
aPTT: 47 seconds — ABNORMAL HIGH (ref 24–36)

## 2019-10-16 LAB — CULTURE, BLOOD (ROUTINE X 2)
Culture: NO GROWTH
Special Requests: ADEQUATE

## 2019-10-16 LAB — HEPARIN LEVEL (UNFRACTIONATED): Heparin Unfractionated: 1.54 IU/mL — ABNORMAL HIGH (ref 0.30–0.70)

## 2019-10-16 LAB — TYPE AND SCREEN
ABO/RH(D): B POS
Antibody Screen: NEGATIVE

## 2019-10-16 LAB — MAGNESIUM: Magnesium: 2.7 mg/dL — ABNORMAL HIGH (ref 1.7–2.4)

## 2019-10-16 LAB — FERRITIN: Ferritin: 1112 ng/mL — ABNORMAL HIGH (ref 11–307)

## 2019-10-16 LAB — RETICULOCYTES
Immature Retic Fract: 20.3 % — ABNORMAL HIGH (ref 2.3–15.9)
RBC.: 3.1 MIL/uL — ABNORMAL LOW (ref 3.87–5.11)
Retic Count, Absolute: 59.8 10*3/uL (ref 19.0–186.0)
Retic Ct Pct: 1.9 % (ref 0.4–3.1)

## 2019-10-16 LAB — VITAMIN B12: Vitamin B-12: 3831 pg/mL — ABNORMAL HIGH (ref 180–914)

## 2019-10-16 LAB — TSH: TSH: 2.713 u[IU]/mL (ref 0.350–4.500)

## 2019-10-16 LAB — IRON AND TIBC
Iron: 111 ug/dL (ref 28–170)
Saturation Ratios: 54 % — ABNORMAL HIGH (ref 10.4–31.8)
TIBC: 207 ug/dL — ABNORMAL LOW (ref 250–450)
UIBC: 96 ug/dL

## 2019-10-16 LAB — FOLATE: Folate: 50.4 ng/mL (ref >5.9)

## 2019-10-16 LAB — D-DIMER, QUANTITATIVE: D-Dimer, Quant: 2.31 ug/mL-FEU — ABNORMAL HIGH (ref 0.00–0.50)

## 2019-10-16 LAB — BRAIN NATRIURETIC PEPTIDE: B Natriuretic Peptide: 453.9 pg/mL — ABNORMAL HIGH (ref 0.0–100.0)

## 2019-10-16 MED ORDER — DILTIAZEM HCL 25 MG/5ML IV SOLN
10.0000 mg | Freq: Once | INTRAVENOUS | Status: AC
  Administered 2019-10-16: 10 mg via INTRAVENOUS
  Filled 2019-10-16: qty 5

## 2019-10-16 MED ORDER — DILTIAZEM LOAD VIA INFUSION: 10.0000 mg | Freq: Once | INTRAVENOUS | Status: DC

## 2019-10-16 MED ORDER — LACTATED RINGERS IV SOLN: INTRAVENOUS | Status: DC

## 2019-10-16 MED ORDER — DOCUSATE SODIUM 100 MG PO CAPS
200.0000 mg | ORAL_CAPSULE | Freq: Two times a day (BID) | ORAL | Status: DC
  Administered 2019-10-16 – 2019-10-18 (×3): 200 mg via ORAL
  Filled 2019-10-16 (×5): qty 2

## 2019-10-16 MED ORDER — FERROUS SULFATE 325 (65 FE) MG PO TABS
325.0000 mg | ORAL_TABLET | Freq: Two times a day (BID) | ORAL | Status: DC
  Administered 2019-10-16 – 2019-10-18 (×5): 325 mg via ORAL
  Filled 2019-10-16 (×5): qty 1

## 2019-10-16 MED ORDER — SENNOSIDES-DOCUSATE SODIUM 8.6-50 MG PO TABS
1.0000 | ORAL_TABLET | Freq: Once | ORAL | Status: AC
  Administered 2019-10-16: 1 via ORAL
  Filled 2019-10-16: qty 1

## 2019-10-16 MED ORDER — POLYETHYLENE GLYCOL 3350 17 G PO PACK
17.0000 g | PACK | Freq: Two times a day (BID) | ORAL | Status: DC
  Administered 2019-10-16 – 2019-10-17 (×2): 17 g via ORAL
  Filled 2019-10-16 (×4): qty 1

## 2019-10-16 MED ORDER — HEPARIN (PORCINE) 25000 UT/250ML-% IV SOLN
600.0000 [IU]/h | INTRAVENOUS | Status: AC
  Administered 2019-10-16: 450 [IU]/h via INTRAVENOUS
  Administered 2019-10-17: 600 [IU]/h via INTRAVENOUS
  Filled 2019-10-16: qty 250

## 2019-10-16 MED ORDER — DILTIAZEM HCL ER COATED BEADS 180 MG PO CP24
180.0000 mg | ORAL_CAPSULE | Freq: Every day | ORAL | Status: DC
  Administered 2019-10-16 – 2019-10-18 (×3): 180 mg via ORAL
  Filled 2019-10-16 (×3): qty 1

## 2019-10-16 NOTE — Plan of Care (Signed)
  Problem: Education: Goal: Knowledge of General Education information will improve Description: Including pain rating scale, medication(s)/side effects and non-pharmacologic comfort measures Outcome: Progressing   Problem: Education: Goal: Knowledge of risk factors and measures for prevention of condition will improve Outcome: Progressing  Patient asking for assistance to turn and reposition in bed. Educated jpatient on preventing bedsores with repositioning.  Patient also with oral intakes less than 75%, provided education on importance of nutrition for recovery.

## 2019-10-16 NOTE — Progress Notes (Signed)
ANTICOAGULATION CONSULT NOTE  Pharmacy Consult for heparin Indication: atrial fibrillation  Patient Measurements: Height: 5\' 3"  (160 cm) Weight: 53.3 kg (117 lb 8.1 oz) IBW/kg (Calculated) : 52.4 Heparin Dosing Weight: 53kg  Vital Signs: Temp: 98 F (36.7 C) (08/23 1202) Temp Source: Oral (08/23 1202) BP: 118/48 (08/23 1202) Pulse Rate: 71 (08/23 1202)  Labs: Recent Labs    10/14/19 0402 10/14/19 1854 10/15/19 0419 10/15/19 0419 10/15/19 1448 10/16/19 0347 10/16/19 1516  HGB 8.2*  --  7.6*  --   --  7.4*  --   HCT 25.0*  --  23.3*  --   --  22.0*  --   PLT 356  --  380  --   --  399  --   APTT  --    < > 123*   < > 130* 134* 47*  HEPARINUNFRC  --   --  >2.20*  --   --  1.54*  --   CREATININE 2.74*  --  3.07*  --   --  2.97*  --    < > = values in this interval not displayed.    Estimated Creatinine Clearance: 10.2 mL/min (A) (by C-G formula based on SCr of 2.97 mg/dL (H)).  Assessment: 29 yoF with hx AFib on apixaban PTA admitted with COVID-19 respiratory failure and AKI. Cr has trended up significantly from 1.5 on admit (~baseline) to 3.07 this morning. Given advanced age and low body weight as well, will hold apixaban for now and begin IV heparin. Last dose of apixaban was 2250 8/20.  APTT 47 now below goal after rate reduction. RN confirmed that PTT drawn from L arm, heparin is running in R arm. No overt bleeding per RN but Hgb down to 7.4, likely due to lab draws and fluid.   Goal of Therapy:  Heparin level 0.3-0.7 units/ml aPTT 66-102 seconds Monitor platelets by anticoagulation protocol: Yes   Plan:  Increase heparin to 550 units/hr Check an 8 hr aPTT/HL F/u aPTT until correlates with heparin level  Monitor daily aPTT, HL, CBC/plt Monitor for signs/symptoms of bleeding    Benetta Spar, PharmD, BCPS, BCCP Clinical Pharmacist  Please check AMION for all [place] phone numbers After 10:00 PM, call [place]

## 2019-10-16 NOTE — Progress Notes (Addendum)
PROGRESS NOTE                                                                                                                                                                                                             Patient Demographics:    [name], is a 86 y.o. female, DOB - [birth year], EOF:[id number]  Outpatient Primary MD for the patient is [name], MD    LOS - 5  Admit date - 10/11/2019    CC - SOB     Brief Narrative  -   [name] is a 86 y.o. female with medical history significant of paroxysmal A. fib on Eliquis, prior CVA, PAD, HTN, HLD, unvaccinated for Covid and recent exposure to COVID-19 through her husband who had an infection was brought into the hospital for generalized weakness as well as mild shortness of breath.  She was diagnosed with COVID-19 pneumonia and admitted to the hospital.   Subjective:   Patient in bed, appears comfortable, denies any headache, no fever, no chest pain or pressure, no shortness of breath , no abdominal pain. No focal weakness.    Assessment  & Plan :     1. Acute Hypoxic Resp. Failure due to Acute Covid 19 Viral Pneumonitis during the ongoing 2020 Covid 19 Pandemic - she used to have moderate disease, she has been appropriately started on steroids and Remdesivir, continue to monitor closely.  He had clinically developed some fluid overload on 10/14/2019 with orthopnea, crackles and elevated BNP, this has improved after single dose Lasix on 10/14/2019.  We will continue to monitor closely.  Encouraged the patient to sit up in chair in the daytime use I-S and flutter valve for pulmonary toiletry and then prone in bed when at night.  Will advance activity and titrate down oxygen as possible.  SpO2: 92 % O2 Flow Rate (L/min): 2.5 L/min   Recent Labs  Lab 10/11/19 1330 10/11/19 1344 10/11/19 1348 10/12/19 0659 10/12/19 1933 10/13/19 0554 10/14/19 0402  10/15/19 0419 10/16/19 0347  WBC   < >  --   --   --  12.0* 14.6* 14.6* 14.1* 12.3*  PLT   < >  --   --   --  358 411* 356 380 399  CRP   < >  --   --   --  13.7* 12.2* 7.1* 5.4* 4.3*  AST   < >  --   --  87*  --  85* 47* 29 24  ALT   < >  --   --  15  --  18 20 20 21   ALKPHOS   < >  --   --  53  --  58 54 54 49  BILITOT   < >  --   --  2.4*  --  2.4* 1.2 0.9 0.9  ALBUMIN   < >  --   --  2.8*  --  3.2* 2.7* 2.8* 2.4*  DDIMER   < >  --   --  3.54*  --  2.19* 1.10* 0.81* 2.31*  PROCALCITON  --  2.17  --   --   --   --   --   --   --   LATICACIDVEN  --  1.5  --   --   --   --   --   --   --   SARSCOV2NAA  --   --  POSITIVE*  --   --   --   --   --   --    < > = values in this interval not displayed.    2.  Underlying history of paroxysmal atrial fibrillation.  Mali vas 2 score of greater than 4.  Went into RVR briefly night of 10/15/2019, check TSH, Cardizem drip increased, currently on heparin drip.  3.  Hypothyroidism.  On thyroid supplementation which will be continued.  4.  Elevated D-dimer due to underlying inflammation.  Will monitor closely spite being on Eliquis currently on heparin due to renal dysfunction.  5.  AKI on CKD 3B.  Baseline creatinine around 1.5.  Renal function worse with hydration and also developed orthopnea, more shortness of breath and coarse crackles on 10/14/2019, BNP elevated.  Lasix pulmonary function has considerably improved on 10/15/2019, renal function has stabilized post gentle hydration on 10/15/2019.  Currently has Foley due to letter up outlet obstruction, nonacute renal ultrasound.  6. Bladder outlet obstruction.  Foley for now.  7.  HX of CVA.  Currently stable on Eliquis for secondary prevention.  8.  Acute on chronic normocytic anemia.  No obvious signs of bleeding, she is constipated, no abdominal pain or tenderness, likely heme dilution, continue heparin drip for now, monitor stools for any obvious signs of bleeding, anemia panel, PO Iron, hold IVF,   type and screen and monitor.  9.  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  Outpatient age-appropriate follow-up by PCP as needed.     Condition - Extremely Guarded  Family Communication  :  Daughter Lunette Stands - [phone] - 10/12/19, 10/13/19, 10/14/2019, she has been clearly told that prognosis is extremely guarded due the combination of her age and COVID-72 infection. Updated -  10/15/2019, 10/16/19 - message left at 9.22 am  Code Status :  Full  Consults  :  None  Procedures  :  None  PUD Prophylaxis : None  Disposition Plan  :    Status is: Inpatient  Remains inpatient appropriate because:IV treatments appropriate due to intensity of illness or inability to take PO   Dispo: The patient is from: Home              Anticipated d/c is to: Home              Anticipated d/c date is: > 3 days  Patient currently is not medically stable to d/c.   DVT Prophylaxis  :  Eliquis  Lab Results  Component Value Date   PLT 399 10/16/2019    Diet :  Diet Order            DIET DYS 3 Room service appropriate? Yes; Fluid consistency: Thin  Diet effective now                  Inpatient Medications  Scheduled Meds: . albuterol  2 puff Inhalation TID  . vitamin C  500 mg Oral Daily  . Chlorhexidine Gluconate Cloth  6 each Topical Daily  . diltiazem  180 mg Oral Daily  . feeding supplement (KATE FARMS STANDARD 1.4)  325 mL Oral TID BM  . hydrALAZINE  25 mg Oral TID  . polyethylene glycol  17 g Oral BID  . senna-docusate  1 tablet Oral Once  . thyroid  15 mg Oral QAC breakfast  . zinc sulfate  220 mg Oral Daily   Continuous Infusions: . heparin 450 Units/hr (10/16/19 0602)   PRN Meds:.acetaminophen, chlorpheniramine-HYDROcodone, guaiFENesin-dextromethorphan, [DISCONTINUED] ondansetron **OR** ondansetron (ZOFRAN) IV  Antibiotics  :    Anti-infectives (From admission, onward)   Start     Dose/Rate Route Frequency Ordered Stop   10/12/19 1000  remdesivir 100 mg in  sodium chloride 0.9 % 100 mL IVPB       "Followed by" Linked Group Details   100 mg 200 mL/hr over 30 Minutes Intravenous Daily 10/11/19 1537 10/15/19 1014   10/11/19 1600  remdesivir 200 mg in sodium chloride 0.9% 250 mL IVPB       "Followed by" Linked Group Details   200 mg 580 mL/hr over 30 Minutes Intravenous Once 10/11/19 1537 10/11/19 1855       Time Spent in minutes  30   Lala Lund M.D on 10/16/2019 at 9:20 AM  To page go to [email or web address] - password [email or web address]  [facility] -  Office  [phone]    See all Orders from today for further details    Objective:   Vitals:   10/15/19 2300 10/16/19 0000 10/16/19 0400 10/16/19 0737  BP: 104/66 (!) 105/51 (!) 114/52   Pulse: 91 100 93   Resp: 20 (!) 21 17   Temp: 98.1 F (36.7 C) 98.2 F (36.8 C) 98.1 F (36.7 C) 97.6 F (36.4 C)  TempSrc: Oral Oral Oral Oral  SpO2: 96% 96% 92%   Weight:      Height:        Wt Readings from Last 3 Encounters:  10/14/19 53.3 kg  09/26/19 55.2 kg  07/31/19 54.4 kg     Intake/Output Summary (Last 24 hours) at 10/16/2019 0920 Last data filed at 10/16/2019 0438 Gross per 24 hour  Intake 1438.81 ml  Output 1150 ml  Net 288.81 ml     Physical Exam  Awake Alert, No new F.N deficits, Normal affect [place].AT,PERRAL Supple Neck,No JVD, No cervical lymphadenopathy appriciated.  Symmetrical Chest wall movement, Good air movement bilaterally, CTAB RRR,No Gallops, Rubs or new Murmurs, No Parasternal Heave +ve B.Sounds, Abd Soft, No tenderness, No organomegaly appriciated, No rebound - guarding or rigidity. No Cyanosis, Clubbing or edema, No new Rash or bruise    Data Review:    CBC Recent Labs  Lab 10/12/19 1933 10/13/19 0554 10/14/19 0402 10/15/19 0419 10/16/19 0347  WBC 12.0* 14.6* 14.6* 14.1* 12.3*  HGB 10.1* 10.4* 8.2* 7.6* 7.4*  HCT 30.9* 32.4* 25.0* 23.3* 22.0*  PLT 358 411* 356 380 399  MCV 85.4 84.8 83.9 82.6 83.3  MCH 27.9 27.2 27.5 27.0 28.0  MCHC 32.7  32.1 32.8 32.6 33.6  RDW 14.0 14.5 14.5 14.5 14.6  LYMPHSABS 0.7 0.9 0.6* 0.7 1.0  MONOABS 0.5 0.7 0.9 1.0 1.2*  EOSABS 0.0 0.0 0.0 0.0 0.0  BASOSABS 0.0 0.0 0.0 0.0 0.0    Recent Labs  Lab 10/11/19 1330 10/11/19 1344 10/12/19 0659 10/12/19 1933 10/13/19 0554 10/13/19 0602 10/14/19 0402 10/15/19 0419 10/16/19 0347  NA   < >  --  137  --  138  --  137 134* 139  K   < >  --  4.1  --  4.2  --  4.4 4.5 4.1  CL   < >  --  106  --  103  --  106 103 106  CO2   < >  --  17*  --  19*  --  17* 20* 20*  GLUCOSE   < >  --  166*  --  160*  --  161* 160* 98  BUN   < >  --  63*  --  94*  --  116* 129* 125*  CREATININE   < >  --  1.61*  --  2.43*  --  2.74* 3.07* 2.97*  CALCIUM   < >  --  8.6*  --  9.2  --  8.5* 8.4* 8.3*  AST   < >  --  87*  --  85*  --  47* 29 24  ALT   < >  --  15  --  18  --  20 20 21   ALKPHOS   < >  --  53  --  58  --  54 54 49  BILITOT   < >  --  2.4*  --  2.4*  --  1.2 0.9 0.9  ALBUMIN   < >  --  2.8*  --  3.2*  --  2.7* 2.8* 2.4*  MG  --   --  2.8*  --  2.9*  --  2.7* 2.9* 2.7*  CRP   < >  --   --  13.7* 12.2*  --  7.1* 5.4* 4.3*  DDIMER   < >  --  3.54*  --  2.19*  --  1.10* 0.81* 2.31*  PROCALCITON  --  2.17  --   --   --   --   --   --   --   LATICACIDVEN  --  1.5  --   --   --   --   --   --   --   BNP  --   --  407.7*  --   --  367.6* 376.8* 368.3* 453.9*   < > = values in this interval not displayed.    Recent Labs  Lab 10/11/19 1330 10/11/19 1344 10/11/19 1348 10/12/19 0659 10/12/19 1933 10/13/19 0554 10/13/19 0602 10/14/19 0402 10/15/19 0419 10/16/19 0347  WBC   < >  --   --   --  12.0* 14.6*  --  14.6* 14.1* 12.3*  CRP   < >  --   --   --  13.7* 12.2*  --  7.1* 5.4* 4.3*  DDIMER   < >  --   --  3.54*  --  2.19*  --  1.10* 0.81* 2.31*  BNP  --   --   --  407.7*  --   --  367.6* 376.8* 368.3* 453.9*  PROCALCITON  --  2.17  --   --   --   --   --   --   --   --   LATICACIDVEN  --  1.5  --   --   --   --   --   --   --   --   AST   < >  --   --   87*  --  85*  --  47* 29 24  ALT   < >  --   --  15  --  18  --  20 20 21   ALKPHOS   < >  --   --  53  --  58  --  54 54 49  BILITOT   < >  --   --  2.4*  --  2.4*  --  1.2 0.9 0.9  ALBUMIN   < >  --   --  2.8*  --  3.2*  --  2.7* 2.8* 2.4*  SARSCOV2NAA  --   --  POSITIVE*  --   --   --   --   --   --   --    < > = values in this interval not displayed.       ------------------------------------------------------------------------------------------------------------------ No results for input(s): CHOL, HDL, LDLCALC, TRIG, CHOLHDL, LDLDIRECT in the last 72 hours.  Lab Results  Component Value Date   HGBA1C 6.2 10/11/2018   ------------------------------------------------------------------------------------------------------------------ No results for input(s): TSH, T4TOTAL, T3FREE, THYROIDAB in the last 72 hours.  Invalid input(s): FREET3  Cardiac Enzymes No results for input(s): CKMB, TROPONINI, MYOGLOBIN in the last 168 hours.  Invalid input(s): CK ------------------------------------------------------------------------------------------------------------------    Component Value Date/Time   BNP 453.9 (H) 10/16/2019 0347    Micro Results Recent Results (from the past 240 hour(s))  SARS Coronavirus 2 by RT PCR (hospital order, performed in [facility] hospital lab) Nasopharyngeal Nasopharyngeal Swab     Status: Abnormal   Collection Time: 10/11/19  1:48 PM   Specimen: Nasopharyngeal Swab  Result Value Ref Range Status   SARS Coronavirus 2 POSITIVE (A) NEGATIVE Final    Comment: RESULT CALLED TO, READ BACK BY AND VERIFIED WITH: M,FOUNTAIN @1631  10/11/19 EB (NOTE) SARS-CoV-2 target nucleic acids are DETECTED  SARS-CoV-2 RNA is generally detectable in upper respiratory specimens  during the acute phase of infection.  Positive results are indicative  of the presence of the identified virus, but do not rule out bacterial infection or co-infection with other pathogens  not detected by the test.  Clinical correlation with patient history and  other diagnostic information is necessary to determine patient infection status.  The expected result is negative.  Fact Sheet for Patients:   StrictlyIdeas.no   Fact Sheet for Healthcare Providers:   BankingDealers.co.za    This test is not yet approved or cleared by the [place] FDA and  has been authorized for detection and/or diagnosis of SARS-CoV-2 by FDA under an Emergency Use Authorization (EUA).  This EUA will remain in effect (meaning this test can  be used) for the duration of  the COVID-19 declaration under Section 564(b)(1) of the Act, 21 U.S.C. section 360-bbb-3(b)(1), unless the authorization is terminated or revoked sooner.  Performed at [facility], [place] [address] [place], [place] [address]   Blood Culture (routine x 2)     Status: None   Collection Time: 10/11/19  1:50 PM   Specimen: BLOOD  RIGHT FOREARM  Result Value Ref Range Status   Specimen Description BLOOD RIGHT FOREARM  Final   Special Requests   Final    BOTTLES DRAWN AEROBIC AND ANAEROBIC Blood Culture adequate volume   Culture   Final    NO GROWTH 5 DAYS Performed at [facility], [address] [place], [place] [address]    Report Status 10/16/2019 FINAL  Final    Radiology Reports US RENAL  Result Date: 10/15/2019 CLINICAL DATA:  Acute renal insufficiency EXAM: RENAL / URINARY TRACT ULTRASOUND COMPLETE COMPARISON:  09/23/2005 FINDINGS: Right Kidney: Renal measurements: 9.2 x 3.8 by 3.6 cm = volume: 65.4 mL. Echogenicity within normal limits. There is a hyperechoic circumscribed mass within the ventral aspect of the mid kidney measuring 0.8 x 0.8 x 1.0 cm, most consistent with renal angiomyolipoma. No hydronephrosis or nephrolithiasis. Left Kidney: Renal measurements: 8.0 x 3.9 x 3.6 cm = volume: 56.2 mL. There is an exophytic simple cyst off the lower pole left  kidney measuring 2.6 x 2.5 x 2.5 cm. No hydronephrosis or nephrolithiasis. Echogenicity within normal limits. Bladder: Bladder is decompressed with a Foley catheter. Other: None. IMPRESSION: 1. 1 cm hyperechoic solid lesion right kidney, compatible with renal angiomyolipoma. 2. Simple left renal cyst. 3. Normal renal echotexture. Electronically Signed   By: Randa Ngo M.D.   On: 10/15/2019 15:37   DG Chest Port 1 View  Result Date: 10/14/2019 CLINICAL DATA:  COVID pneumonia EXAM: PORTABLE CHEST 1 VIEW COMPARISON:  Three days ago FINDINGS: Improved lung volumes and improved patchy airspace disease. No effusion or pneumothorax. Normal heart size and mediastinal contours. IMPRESSION: Improved inflation and infiltrates. Electronically Signed   By: Monte Fantasia M.D.   On: 10/14/2019 10:04   DG Chest Port 1 View  Result Date: 10/11/2019 CLINICAL DATA:  Weakness shortness of breath for 1 week EXAM: PORTABLE CHEST 1 VIEW COMPARISON:  05/16/2019 FINDINGS: Right lower lobe and left lower lung hazy airspace disease. No pleural effusion or pneumothorax. Stable cardiomediastinal silhouette. No aggressive osseous lesion. IMPRESSION: Bilateral lower lobe hazy airspace disease concerning for pneumonia including atypical viral pneumonia. Electronically Signed   By: Kathreen Devoid   On: 10/11/2019 14:48

## 2019-10-16 NOTE — Progress Notes (Signed)
ANTICOAGULATION CONSULT NOTE  Pharmacy Consult for heparin Indication: atrial fibrillation  Patient Measurements: Height: 5\' 3"  (160 cm) Weight: 53.3 kg (117 lb 8.1 oz) IBW/kg (Calculated) : 52.4 Heparin Dosing Weight: 53kg  Vital Signs: Temp: 98.1 F (36.7 C) (08/23 0400) Temp Source: Oral (08/23 0400) BP: 114/52 (08/23 0400) Pulse Rate: 93 (08/23 0400)  Labs: Recent Labs    10/13/19 0554 10/13/19 0554 10/14/19 0402 10/14/19 1854 10/15/19 0419 10/15/19 1448 10/16/19 0347  HGB 10.4*   < > 8.2*  --  7.6*  --  7.4*  HCT 32.4*   < > 25.0*  --  23.3*  --  22.0*  PLT 411*   < > 356  --  380  --  399  APTT  --   --   --    < > 123* 130* 134*  HEPARINUNFRC  --   --   --   --  >2.20*  --   --   CREATININE 2.43*  --  2.74*  --  3.07*  --   --    < > = values in this interval not displayed.    Estimated Creatinine Clearance: 9.9 mL/min (A) (by C-G formula based on SCr of 3.07 mg/dL (H)).  Assessment: 34 yoF with hx AFib on apixaban PTA admitted with COVID-19 respiratory failure and AKI. Cr has trended up significantly from 1.5 on admit (~baseline) to 3.07 this morning. Given advanced age and low body weight as well, will hold apixaban for now and begin IV heparin. Last dose of apixaban was 2250 8/20.  PTT remains elevated and has increased despite rate reduction. RN confirmed that PTT drawn from arm opposite where heparin running. No overt bleeding noted but Hgb down to 7.4.  Goal of Therapy:  Heparin level 0.3-0.7 units/ml aPTT 66-102 seconds Monitor platelets by anticoagulation protocol: Yes   Plan:  Hold heparin x 1 hr then resume at 450 units/hr Check an 8 hr aPTT; f/u CBC closely  Sherlon Handing, PharmD, BCPS Please see amion for complete clinical pharmacist phone list 10/16/2019 4:41 AM

## 2019-10-16 NOTE — Progress Notes (Signed)
Called by RN.  Patient went into atrial fibrillation with RVR with heart rate of 130-140 approximately 20 minutes ago.  Patient denies chest pain or palpitations.  She is asymptomatic at this time.  Has history of paroxysmal atrial fibrillation.  On heparin infusion for anticoagulation. IV fluids with LR finished at 2300 last night.  We will obtain EKG.  Give 10 mg of Cardizem IV.  Continue to monitor.  Resume LR at 50 mils per hour

## 2019-10-17 LAB — CBC WITH DIFFERENTIAL/PLATELET
Abs Immature Granulocytes: 0.24 10*3/uL — ABNORMAL HIGH (ref 0.00–0.07)
Basophils Absolute: 0 10*3/uL (ref 0.0–0.1)
Basophils Relative: 0 %
Eosinophils Absolute: 0 10*3/uL (ref 0.0–0.5)
Eosinophils Relative: 0 %
HCT: 24 % — ABNORMAL LOW (ref 36.0–46.0)
Hemoglobin: 7.8 g/dL — ABNORMAL LOW (ref 12.0–15.0)
Immature Granulocytes: 2 %
Lymphocytes Relative: 10 %
Lymphs Abs: 1.1 10*3/uL (ref 0.7–4.0)
MCH: 27.4 pg (ref 26.0–34.0)
MCHC: 32.5 g/dL (ref 30.0–36.0)
MCV: 84.2 fL (ref 80.0–100.0)
Monocytes Absolute: 0.9 10*3/uL (ref 0.1–1.0)
Monocytes Relative: 8 %
Neutro Abs: 8.9 10*3/uL — ABNORMAL HIGH (ref 1.7–7.7)
Neutrophils Relative %: 80 %
Platelets: 390 10*3/uL (ref 150–400)
RBC: 2.85 MIL/uL — ABNORMAL LOW (ref 3.87–5.11)
RDW: 14.6 % (ref 11.5–15.5)
WBC: 11.1 10*3/uL — ABNORMAL HIGH (ref 4.0–10.5)
nRBC: 0 % (ref 0.0–0.2)

## 2019-10-17 LAB — COMPREHENSIVE METABOLIC PANEL
ALT: 24 U/L (ref 0–44)
AST: 29 U/L (ref 15–41)
Albumin: 2.7 g/dL — ABNORMAL LOW (ref 3.5–5.0)
Alkaline Phosphatase: 55 U/L (ref 38–126)
Anion gap: 12 (ref 5–15)
BUN: 107 mg/dL — ABNORMAL HIGH (ref 8–23)
CO2: 20 mmol/L — ABNORMAL LOW (ref 22–32)
Calcium: 8.5 mg/dL — ABNORMAL LOW (ref 8.9–10.3)
Chloride: 107 mmol/L (ref 98–111)
Creatinine, Ser: 2.82 mg/dL — ABNORMAL HIGH (ref 0.44–1.00)
GFR calc Af Amer: 16 mL/min — ABNORMAL LOW (ref >60)
GFR calc non Af Amer: 14 mL/min — ABNORMAL LOW (ref >60)
Glucose, Bld: 111 mg/dL — ABNORMAL HIGH (ref 70–99)
Potassium: 4.2 mmol/L (ref 3.5–5.1)
Sodium: 139 mmol/L (ref 135–145)
Total Bilirubin: 0.9 mg/dL (ref 0.3–1.2)
Total Protein: 5.5 g/dL — ABNORMAL LOW (ref 6.5–8.1)

## 2019-10-17 LAB — BRAIN NATRIURETIC PEPTIDE: B Natriuretic Peptide: 407 pg/mL — ABNORMAL HIGH (ref 0.0–100.0)

## 2019-10-17 LAB — APTT
aPTT: 65 seconds — ABNORMAL HIGH (ref 24–36)
aPTT: 90 seconds — ABNORMAL HIGH (ref 24–36)

## 2019-10-17 LAB — C-REACTIVE PROTEIN: CRP: 10 mg/dL — ABNORMAL HIGH (ref <1.0)

## 2019-10-17 LAB — D-DIMER, QUANTITATIVE: D-Dimer, Quant: 0.72 ug/mL-FEU — ABNORMAL HIGH (ref 0.00–0.50)

## 2019-10-17 LAB — MAGNESIUM: Magnesium: 2.9 mg/dL — ABNORMAL HIGH (ref 1.7–2.4)

## 2019-10-17 LAB — HEPARIN LEVEL (UNFRACTIONATED): Heparin Unfractionated: 1.07 IU/mL — ABNORMAL HIGH (ref 0.30–0.70)

## 2019-10-17 MED ORDER — PANTOPRAZOLE SODIUM 40 MG PO TBEC
40.0000 mg | DELAYED_RELEASE_TABLET | Freq: Two times a day (BID) | ORAL | Status: DC
  Administered 2019-10-17 – 2019-10-18 (×3): 40 mg via ORAL
  Filled 2019-10-17 (×3): qty 1

## 2019-10-17 MED ORDER — TAMSULOSIN HCL 0.4 MG PO CAPS
0.4000 mg | ORAL_CAPSULE | Freq: Every day | ORAL | Status: DC
  Administered 2019-10-17 – 2019-10-18 (×2): 0.4 mg via ORAL
  Filled 2019-10-17 (×2): qty 1

## 2019-10-17 MED ORDER — FUROSEMIDE 20 MG PO TABS
20.0000 mg | ORAL_TABLET | Freq: Once | ORAL | Status: AC
  Administered 2019-10-17: 20 mg via ORAL
  Filled 2019-10-17: qty 1

## 2019-10-17 NOTE — Progress Notes (Signed)
PROGRESS NOTE                                                                                                                                                                                                             Patient Demographics:    [name], is a 86 y.o. female, DOB - 1925-03-24, HQP:[id number]  Outpatient Primary MD for the patient is [name], MD    LOS - 6  Admit date - 10/11/2019    CC - SOB     Brief Narrative  -   [name] is a 86 y.o. female with medical history significant of paroxysmal A. fib on Eliquis, prior CVA, PAD, HTN, HLD, unvaccinated for Covid and recent exposure to COVID-19 through her husband who had an infection was brought into the hospital for generalized weakness as well as mild shortness of breath.  She was diagnosed with COVID-19 pneumonia and admitted to the hospital.   Subjective:   Patient in bed, appears comfortable, denies any headache, no fever, no chest pain or pressure, no shortness of breath , no abdominal pain. No focal weakness.   Assessment  & Plan :     1. Acute Hypoxic Resp. Failure due to Acute Covid 19 Viral Pneumonitis during the ongoing 2020 Covid 19 Pandemic - she used to have moderate disease, she has been appropriately started on steroids and Remdesivir, continue to monitor closely.  He had clinically developed some fluid overload on 10/14/2019 with orthopnea, crackles and elevated BNP, this has improved after single dose Lasix on 10/14/2019, repeat small dose orally on 10/17/2019.  We will continue to monitor closely.  Encouraged the patient to sit up in chair in the daytime use I-S and flutter valve for pulmonary toiletry and then prone in bed when at night.  Will advance activity and titrate down oxygen as possible.  SpO2: 97 % O2 Flow Rate (L/min): 2.5 L/min   Recent Labs  Lab 10/11/19 1344 10/11/19 1348 10/12/19 0659 10/13/19 0554  10/14/19 0402 10/15/19 0419 10/16/19 0347 10/17/19 0312  WBC  --   --    < > 14.6* 14.6* 14.1* 12.3* 11.1*  PLT  --   --    < > 411* 356 380 399 390  CRP  --   --    < > 12.2* 7.1* 5.4* 4.3*  10.0*  AST  --   --    < > 85* 47* 29 24 29   ALT  --   --    < > 18 20 20 21 24   ALKPHOS  --   --    < > 58 54 54 49 55  BILITOT  --   --    < > 2.4* 1.2 0.9 0.9 0.9  ALBUMIN  --   --    < > 3.2* 2.7* 2.8* 2.4* 2.7*  DDIMER  --   --    < > 2.19* 1.10* 0.81* 2.31* 0.72*  PROCALCITON 2.17  --   --   --   --   --   --   --   LATICACIDVEN 1.5  --   --   --   --   --   --   --   SARSCOV2NAA  --  POSITIVE*  --   --   --   --   --   --    < > = values in this interval not displayed.   Lab Results  Component Value Date   TSH 2.713 10/16/2019    2.  Underlying history of paroxysmal atrial fibrillation.  Mali vas 2 score of greater than 4.  Went into RVR briefly night of 10/15/2019, stable TSH , Cardizem dose increased, currently on heparin drip >> Eliquis.  3.  Hypothyroidism.  On thyroid supplementation which will be continued.  4.  Elevated D-dimer due to underlying inflammation.  Will monitor closely spite being on Eliquis currently on heparin due to renal dysfunction.  Likely renal function continues to improve resume Eliquis in a day or 2.  5.  AKI on CKD 3B.  Baseline creatinine around 1.5.  Renal function worse with hydration and also developed orthopnea, more shortness of breath and coarse crackles on 10/14/2019, BNP elevated.  Lasix pulmonary function has considerably improved on 10/15/2019, renal function has stabilized post gentle hydration on 10/15/2019.  Currently has Foley due to letter up outlet obstruction, nonacute renal ultrasound.  6. Bladder outlet obstruction.  Foley and Flomax for now.     7.  HX of CVA.  Currently stable on Eliquis for secondary prevention.  8.  Acute on chronic normocytic anemia.  No obvious signs of bleeding, she is constipated, no abdominal pain or tenderness,  likely heme dilution, continue heparin drip for now , monitor stools for any obvious signs of bleeding, anemia panel, PO Iron, hold IVF,  type and screen, H&H improved, likely most of the drop was heme dilution.  9.  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  Outpatient age-appropriate follow-up by PCP as needed.  10.  Deconditioning.  Qualified for SNF but daughter wants to take her home with home health which will be ordered.   Daughter expresses her wishes to take the patient home on 10/18/2019 with home health even if she is medically not ready, she informs that she is working on Engineer, maintenance (IT) help today and will be ready in a day or 2, she has been clearly informed that if medically ready will be more than happy to discharge her or else it will be upon the responsibility of the daughter who is a Restaurant manager, fast food by profession.    Condition - Extremely Guarded  Family Communication  :  Daughter Lunette Stands - [phone] - 10/12/19, 10/13/19, 10/14/2019, she has been clearly told that prognosis is extremely guarded due the combination of her age and COVID-79 infection. Updated -  10/15/2019,  10/16/19, 10/17/19 - she expresses her wishes that patient be discharged home with home health and not SNF.  Code Status :  Full  Consults  :  None  Procedures  :  None  PUD Prophylaxis : None  Disposition Plan  :    Status is: Inpatient  Remains inpatient appropriate because:IV treatments appropriate due to intensity of illness or inability to take PO   Dispo: The patient is from: Home              Anticipated d/c is to: Home              Anticipated d/c date is: > 3 days              Patient currently is not medically stable to d/c.   DVT Prophylaxis  :  Eliquis  Lab Results  Component Value Date   PLT 390 10/17/2019    Diet :  Diet Order            DIET DYS 3 Room service appropriate? Yes; Fluid consistency: Thin  Diet effective now                  Inpatient Medications  Scheduled  Meds: . albuterol  2 puff Inhalation TID  . vitamin C  500 mg Oral Daily  . Chlorhexidine Gluconate Cloth  6 each Topical Daily  . diltiazem  180 mg Oral Daily  . docusate sodium  200 mg Oral BID  . feeding supplement (KATE FARMS STANDARD 1.4)  325 mL Oral TID BM  . ferrous sulfate  325 mg Oral BID WC  . furosemide  20 mg Oral Once  . hydrALAZINE  25 mg Oral TID  . pantoprazole  40 mg Oral BID  . polyethylene glycol  17 g Oral BID  . thyroid  15 mg Oral QAC breakfast  . zinc sulfate  220 mg Oral Daily   Continuous Infusions: . heparin 600 Units/hr (10/17/19 0356)   PRN Meds:.acetaminophen, chlorpheniramine-HYDROcodone, guaiFENesin-dextromethorphan, [DISCONTINUED] ondansetron **OR** ondansetron (ZOFRAN) IV  Antibiotics  :    Anti-infectives (From admission, onward)   Start     Dose/Rate Route Frequency Ordered Stop   10/12/19 1000  remdesivir 100 mg in sodium chloride 0.9 % 100 mL IVPB       "Followed by" Linked Group Details   100 mg 200 mL/hr over 30 Minutes Intravenous Daily 10/11/19 1537 10/15/19 1014   10/11/19 1600  remdesivir 200 mg in sodium chloride 0.9% 250 mL IVPB       "Followed by" Linked Group Details   200 mg 580 mL/hr over 30 Minutes Intravenous Once 10/11/19 1537 10/11/19 1855       Time Spent in minutes  30   Lala Lund M.D on 10/17/2019 at 10:23 AM  To page go to [email or web address] - password [email or web address]  [facility] -  Office  [phone]    See all Orders from today for further details    Objective:   Vitals:   10/16/19 1000 10/16/19 1202 10/16/19 2219 10/17/19 0547  BP:  (!) 118/48 (!) 131/59 (!) 122/49  Pulse: 72 71 72 68  Resp:   18 16  Temp:  98 F (36.7 C) 97.8 F (36.6 C) 98.9 F (37.2 C)  TempSrc:  Oral Oral Oral  SpO2: 93% 94% 99% 97%  Weight:      Height:        Wt Readings from Last 3 Encounters:  10/14/19 53.3 kg  09/26/19  55.2 kg  07/31/19 54.4 kg     Intake/Output Summary (Last 24 hours) at 10/17/2019 1023 Last  data filed at 10/16/2019 2015 Gross per 24 hour  Intake 154.45 ml  Output 1250 ml  Net -1095.55 ml     Physical Exam  Awake Alert, No new F.N deficits, Normal affect [place].AT,PERRAL Supple Neck,No JVD, No cervical lymphadenopathy appriciated.  Symmetrical Chest wall movement, Good air movement bilaterally, CTAB RRR,No Gallops, Rubs or new Murmurs, No Parasternal Heave +ve B.Sounds, Abd Soft, No tenderness, No organomegaly appriciated, No rebound - guarding or rigidity. No Cyanosis, Clubbing or edema, No new Rash or bruise     Data Review:    CBC Recent Labs  Lab 10/13/19 0554 10/14/19 0402 10/15/19 0419 10/16/19 0347 10/17/19 0312  WBC 14.6* 14.6* 14.1* 12.3* 11.1*  HGB 10.4* 8.2* 7.6* 7.4* 7.8*  HCT 32.4* 25.0* 23.3* 22.0* 24.0*  PLT 411* 356 380 399 390  MCV 84.8 83.9 82.6 83.3 84.2  MCH 27.2 27.5 27.0 28.0 27.4  MCHC 32.1 32.8 32.6 33.6 32.5  RDW 14.5 14.5 14.5 14.6 14.6  LYMPHSABS 0.9 0.6* 0.7 1.0 1.1  MONOABS 0.7 0.9 1.0 1.2* 0.9  EOSABS 0.0 0.0 0.0 0.0 0.0  BASOSABS 0.0 0.0 0.0 0.0 0.0    Recent Labs  Lab 10/11/19 1344 10/12/19 0659 10/13/19 0554 10/13/19 0602 10/14/19 0402 10/15/19 0419 10/16/19 0347 10/16/19 1031 10/17/19 0312 10/17/19 0313  NA  --    < > 138  --  137 134* 139  --  139  --   K  --    < > 4.2  --  4.4 4.5 4.1  --  4.2  --   CL  --    < > 103  --  106 103 106  --  107  --   CO2  --    < > 19*  --  17* 20* 20*  --  20*  --   GLUCOSE  --    < > 160*  --  161* 160* 98  --  111*  --   BUN  --    < > 94*  --  116* 129* 125*  --  107*  --   CREATININE  --    < > 2.43*  --  2.74* 3.07* 2.97*  --  2.82*  --   CALCIUM  --    < > 9.2  --  8.5* 8.4* 8.3*  --  8.5*  --   AST  --    < > 85*  --  47* 29 24  --  29  --   ALT  --    < > 18  --  20 20 21   --  24  --   ALKPHOS  --    < > 58  --  54 54 49  --  55  --   BILITOT  --    < > 2.4*  --  1.2 0.9 0.9  --  0.9  --   ALBUMIN  --    < > 3.2*  --  2.7* 2.8* 2.4*  --  2.7*  --   MG  --    < >  2.9*  --  2.7* 2.9* 2.7*  --  2.9*  --   CRP  --    < > 12.2*  --  7.1* 5.4* 4.3*  --  10.0*  --   DDIMER  --    < > 2.19*  --  1.10* 0.81* 2.31*  --  0.72*  --   PROCALCITON 2.17  --   --   --   --   --   --   --   --   --   LATICACIDVEN 1.5  --   --   --   --   --   --   --   --   --   TSH  --   --   --   --   --   --   --  2.713  --   --   BNP  --    < >  --  367.6* 376.8* 368.3* 453.9*  --   --  407.0*   < > = values in this interval not displayed.    Recent Labs  Lab 10/11/19 1344 10/11/19 1348 10/12/19 0659 10/13/19 0554 10/13/19 0602 10/14/19 0402 10/15/19 0419 10/16/19 0347 10/17/19 0312 10/17/19 0313  WBC  --   --    < > 14.6*  --  14.6* 14.1* 12.3* 11.1*  --   CRP  --   --    < > 12.2*  --  7.1* 5.4* 4.3* 10.0*  --   DDIMER  --   --    < > 2.19*  --  1.10* 0.81* 2.31* 0.72*  --   BNP  --   --    < >  --  367.6* 376.8* 368.3* 453.9*  --  407.0*  PROCALCITON 2.17  --   --   --   --   --   --   --   --   --   LATICACIDVEN 1.5  --   --   --   --   --   --   --   --   --   AST  --   --    < > 85*  --  47* 29 24 29   --   ALT  --   --    < > 18  --  20 20 21 24   --   ALKPHOS  --   --    < > 58  --  54 54 49 55  --   BILITOT  --   --    < > 2.4*  --  1.2 0.9 0.9 0.9  --   ALBUMIN  --   --    < > 3.2*  --  2.7* 2.8* 2.4* 2.7*  --   SARSCOV2NAA  --  POSITIVE*  --   --   --   --   --   --   --   --    < > = values in this interval not displayed.       ------------------------------------------------------------------------------------------------------------------ No results for input(s): CHOL, HDL, LDLCALC, TRIG, CHOLHDL, LDLDIRECT in the last 72 hours.  Lab Results  Component Value Date   HGBA1C 6.2 10/11/2018   ------------------------------------------------------------------------------------------------------------------ Recent Labs    10/16/19 1031  TSH 2.713    Cardiac Enzymes No results for input(s): CKMB, TROPONINI, MYOGLOBIN in the last 168  hours.  Invalid input(s): CK ------------------------------------------------------------------------------------------------------------------    Component Value Date/Time   BNP 407.0 (H) 10/17/2019 0388    Micro Results Recent Results (from the past 240 hour(s))  SARS Coronavirus 2 by RT PCR (hospital order, performed in [facility] hospital lab) Nasopharyngeal Nasopharyngeal Swab     Status: Abnormal   Collection Time: 10/11/19  1:48 PM   Specimen: Nasopharyngeal  Swab  Result Value Ref Range Status   SARS Coronavirus 2 POSITIVE (A) NEGATIVE Final    Comment: RESULT CALLED TO, READ BACK BY AND VERIFIED WITH: M,FOUNTAIN @1631  10/11/19 EB (NOTE) SARS-CoV-2 target nucleic acids are DETECTED  SARS-CoV-2 RNA is generally detectable in upper respiratory specimens  during the acute phase of infection.  Positive results are indicative  of the presence of the identified virus, but do not rule out bacterial infection or co-infection with other pathogens not detected by the test.  Clinical correlation with patient history and  other diagnostic information is necessary to determine patient infection status.  The expected result is negative.  Fact Sheet for Patients:   StrictlyIdeas.no   Fact Sheet for Healthcare Providers:   BankingDealers.co.za    This test is not yet approved or cleared by the [place] FDA and  has been authorized for detection and/or diagnosis of SARS-CoV-2 by FDA under an Emergency Use Authorization (EUA).  This EUA will remain in effect (meaning this test can  be used) for the duration of  the COVID-19 declaration under Section 564(b)(1) of the Act, 21 U.S.C. section 360-bbb-3(b)(1), unless the authorization is terminated or revoked sooner.  Performed at [facility], [place] [address], [place], [place] [address]   Blood Culture (routine x 2)     Status: None   Collection Time: 10/11/19  1:50 PM    Specimen: BLOOD RIGHT FOREARM  Result Value Ref Range Status   Specimen Description BLOOD RIGHT FOREARM  Final   Special Requests   Final    BOTTLES DRAWN AEROBIC AND ANAEROBIC Blood Culture adequate volume   Culture   Final    NO GROWTH 5 DAYS Performed at [facility], [place] [address] [place], [place] [address]    Report Status 10/16/2019 FINAL  Final    Radiology Reports US RENAL  Result Date: 10/15/2019 CLINICAL DATA:  Acute renal insufficiency EXAM: RENAL / URINARY TRACT ULTRASOUND COMPLETE COMPARISON:  09/23/2005 FINDINGS: Right Kidney: Renal measurements: 9.2 x 3.8 by 3.6 cm = volume: 65.4 mL. Echogenicity within normal limits. There is a hyperechoic circumscribed mass within the ventral aspect of the mid kidney measuring 0.8 x 0.8 x 1.0 cm, most consistent with renal angiomyolipoma. No hydronephrosis or nephrolithiasis. Left Kidney: Renal measurements: 8.0 x 3.9 x 3.6 cm = volume: 56.2 mL. There is an exophytic simple cyst off the lower pole left kidney measuring 2.6 x 2.5 x 2.5 cm. No hydronephrosis or nephrolithiasis. Echogenicity within normal limits. Bladder: Bladder is decompressed with a Foley catheter. Other: None. IMPRESSION: 1. 1 cm hyperechoic solid lesion right kidney, compatible with renal angiomyolipoma. 2. Simple left renal cyst. 3. Normal renal echotexture. Electronically Signed   By: Randa Ngo M.D.   On: 10/15/2019 15:37   DG Chest Port 1 View  Result Date: 10/14/2019 CLINICAL DATA:  COVID pneumonia EXAM: PORTABLE CHEST 1 VIEW COMPARISON:  Three days ago FINDINGS: Improved lung volumes and improved patchy airspace disease. No effusion or pneumothorax. Normal heart size and mediastinal contours. IMPRESSION: Improved inflation and infiltrates. Electronically Signed   By: Monte Fantasia M.D.   On: 10/14/2019 10:04   DG Chest Port 1 View  Result Date: 10/11/2019 CLINICAL DATA:  Weakness shortness of breath for 1 week EXAM: PORTABLE CHEST 1 VIEW COMPARISON:   05/16/2019 FINDINGS: Right lower lobe and left lower lung hazy airspace disease. No pleural effusion or pneumothorax. Stable cardiomediastinal silhouette. No aggressive osseous lesion. IMPRESSION: Bilateral lower lobe hazy  airspace disease concerning for pneumonia including atypical viral pneumonia. Electronically Signed   By: Kathreen Devoid   On: 10/11/2019 14:48

## 2019-10-17 NOTE — Progress Notes (Signed)
ANTICOAGULATION CONSULT NOTE  Pharmacy Consult for heparin Indication: atrial fibrillation  Patient Measurements: Height: 5\' 3"  (160 cm) Weight: 53.3 kg (117 lb 8.1 oz) IBW/kg (Calculated) : 52.4 Heparin Dosing Weight: 53kg  Vital Signs: Temp: 98.9 F (37.2 C) (08/24 0547) Temp Source: Oral (08/24 0547) BP: 118/44 (08/24 1046) Pulse Rate: 63 (08/24 1046)  Labs: Recent Labs    10/15/19 0419 10/15/19 1448 10/16/19 0347 10/16/19 0347 10/16/19 1516 10/17/19 0200 10/17/19 0312 10/17/19 1244  HGB 7.6*  --  7.4*  --   --   --  7.8*  --   HCT 23.3*  --  22.0*  --   --   --  24.0*  --   PLT 380  --  399  --   --   --  390  --   APTT 123*   < > 134*   < > 47* 65*  --  90*  HEPARINUNFRC >2.20*  --  1.54*  --   --   --  1.07*  --   CREATININE 3.07*  --  2.97*  --   --   --  2.82*  --    < > = values in this interval not displayed.    Estimated Creatinine Clearance: 10.7 mL/min (A) (by C-G formula based on SCr of 2.82 mg/dL (H)).  Assessment: 69 yoF with hx AFib on apixaban PTA admitted with COVID-19 respiratory failure and AKI. Cr had trended up significantly from 1.5 on admit (~baseline) to 3.07 on 7/22. Given advanced age and low body weight as well, will hold apixaban for now and begin IV heparin. Last dose of apixaban was 2250 8/20.  8 hour aPTT= 90 sec, is currently therapeutic on heparin rate 600 units/hr.  No overt bleeding noted but Hgb down to 7.4. AKI on CKD stage IIIb. Scr has decreased, 2.82 today.  Baseline SCr 1.5.    Hgb 7.8 slight bump up, pltc within normal stable. No bleeding reported.  Monitoring aPTT due to prior apixaban elevates  heparin levels. Heparin level starting to trend down.  When heparin level correlates with aPTT we can change to monitor via heparin level.   Goal of Therapy:  Heparin level 0.3-0.7 units/ml aPTT 66-102 seconds Monitor platelets by anticoagulation protocol: Yes   Plan:  Continue heparin drip at 600 units/hr Daily aPTT, heparin  level, CBC   Nicole Cella, RPh Clinical Pharmacist [phone] Please check AMION for all [place] phone numbers After 10:00 PM, call [place] [phone] 10/17/2019 1:32 PM

## 2019-10-17 NOTE — TOC Initial Note (Signed)
Transition of Care [facility]) - Initial/Assessment Note    Patient Details  Name: [name] MRN: [id number] Date of Birth: 06-18-1925  [facility] CM/SW Contact:    Carles Collet, RN Phone Number: 10/17/2019, 2:25 PM  Clinical Narrative:    [name] w patient's daughter over the phone. She is on her way back from [place], and plans to stay with her mother. She has arrranged 24 hour care and would like to also have [name] PT OT RN HHA, and rollator. Daughter requesting PTAR, as patient too weak to get up stairs to home.  Discussed [place] providers and ratings and referral made to [facility].  Rollator to be delivered to the house through [place]. Daughter states that they can use the spouse's DME until it is delivered.                Expected Discharge Plan: [place] Barriers to Discharge: Continued Medical Work up   Patient Goals and CMS Choice Patient states their goals for this hospitalization and ongoing recovery are:: Return home CMS Medicare.gov Compare Post Acute Care list provided to:: Patient Represenative (must comment) (Daughter) Choice offered to / list presented to : Adult Children  Expected Discharge Plan and Services Expected Discharge Plan: [place] In-house Referral: Clinical Social Work Discharge Planning Services: CM Consult Post Acute Care Choice: [place] arrangements for the past 2 months: [place]                 DME Arranged: [place] rolling with seat DME Agency: [employer] Date DME Agency Contacted: 10/17/19   Representative spoke with at DME Agency: [name], requested this to be delivered tothe house HH Arranged: RN, PT, OT, Nurse's Aide [name] Agency: [facility] ([facility]) Date HH Agency Contacted: 10/17/19 Time [place]: 57 Representative spoke with at [place]: [name]  Prior Living Arrangements/Services Living arrangements for the past 2 months: [place]  with:: Spouse, Adult Children Patient language and need for interpreter reviewed:: Yes Do you feel safe going back to the place where you live?: Yes      Need for Family Participation in Patient Care: Yes (Comment) Care giver support system in place?: Yes (comment) Current home services: DME ([facility]) Criminal Activity/Legal Involvement Pertinent to Current Situation/Hospitalization: No - Comment as needed  Activities of Daily Living   ADL Screening (condition at time of admission) Patient's cognitive ability adequate to safely complete daily activities?: Yes Is the patient deaf or have difficulty hearing?: No Does the patient have difficulty seeing, even when wearing glasses/contacts?: Yes Does the patient have difficulty concentrating, remembering, or making decisions?: No Patient able to express need for assistance with ADLs?: Yes Does the patient have difficulty dressing or bathing?: Yes Independently performs ADLs?: No Communication: Independent Dressing (OT): Appropriate for developmental age, Needs assistance Is this a change from baseline?: Pre-admission baseline Grooming: Appropriate for developmental age, Needs assistance Feeding: Independent Bathing: Appropriate for developmental age 78: Appropriate for developmental age In/Out Bed: Appropriate for developmental age 35 in Home: Appropriate for developmental age Does the patient have difficulty walking or climbing stairs?: No Weakness of Legs: Both Weakness of Arms/Hands: None  Permission Sought/Granted Permission sought to share information with : Facility Sport and exercise psychologist, Family Supports Permission granted to share information with : No  Share Information with NAME: Orthoptist granted to share info w AGENCY: [place] granted to share info w Relationship: Daughter  Permission  granted to share info w Contact Information: [phone]  Emotional Assessment   Attitude/Demeanor/Rapport:  Unable to Assess Affect (typically observed): Unable to Assess Orientation: : Oriented to Self, Oriented to Place, Oriented to  Time, Oriented to Situation Alcohol / Substance Use: Not Applicable Psych Involvement: No (comment)  Admission diagnosis:  Acute hypoxemic respiratory failure ([place]) [J96.01] Acute respiratory failure due to COVID-19 ([place]) [U07.1, J96.00] COVID-19 [U07.1] Patient Active Problem List   Diagnosis Date Noted  . COVID-19 10/12/2019  . Acute hypoxemic respiratory failure ([place]) 10/11/2019  . CKD (chronic kidney disease), stage III 06/15/2018  . Low vitamin B12 level 12/09/2017  . Anemia 08/16/2017  . Chronic cough 08/12/2015  . Perennial allergic rhinitis 08/12/2015  . GERD (gastroesophageal reflux disease) 06/25/2014  . Hyperglycemia 06/25/2014  . Vitamin D deficiency 06/25/2014  . Hypothyroidism 06/07/2014  . Hyperlipidemia 06/07/2014  . Intracervical pessary 06/07/2014  . Sinus bradycardia 05/19/2013  . PAD (peripheral artery disease) ([place]) 05/10/2013  . PAC (premature atrial contraction) 05/20/2011  . PAF (paroxysmal atrial fibrillation) ([place])   . History of stroke   . Hypertension    PCP:  [name], MD Pharmacy:   [facility], [place] [address] Phone: [phone] Fax: [phone]  [facility], [place] [address] Phone: [phone] Fax: [place], [place] - [place] [address] [place] [address] Phone: [phone] Fax: [phone]     Social Determinants of Health (SDOH) Interventions    Readmission Risk Interventions No flowsheet data found.

## 2019-10-17 NOTE — Progress Notes (Signed)
ANTICOAGULATION CONSULT NOTE - Follow Up Consult  Pharmacy Consult for heparin Indication: atrial fibrillation  Labs: Recent Labs    10/14/19 0402 10/14/19 1854 10/15/19 0419 10/15/19 1448 10/16/19 0347 10/16/19 1516 10/17/19 0200 10/17/19 0312  HGB 8.2*  --  7.6*  --  7.4*  --   --  7.8*  HCT 25.0*  --  23.3*  --  22.0*  --   --  24.0*  PLT 356  --  380  --  399  --   --  390  APTT  --    < > 123*   < > 134* 47* 65*  --   HEPARINUNFRC  --   --  >2.20*  --  1.54*  --   --   --   CREATININE 2.74*  --  3.07*  --  2.97*  --   --   --    < > = values in this interval not displayed.    Assessment: 86yo female remains slightly subtherapeutic on heparin after rate change; no gtt issues or signs of bleeding per RN.  Goal of Therapy:  aPTT 66-102 seconds   Plan:  Will increase heparin gtt by 1 unit/kg/hr to 600 units/hr and check level in 8 hours.    Wynona Neat, PharmD, BCPS  10/17/2019,3:52 AM

## 2019-10-17 NOTE — TOC Initial Note (Signed)
Transition of Care [facility]) - Initial/Assessment Note    Patient Details  Name: [name] MRN: [id number] Date of Birth: May 24, 1925  Transition [facility]) CM/SW Contact:    Benard Halsted, LCSW Phone Number: 10/17/2019, 11:45 AM  Clinical Narrative:                 CSW received consult for possible home health services at time of discharge. CSW spoke with patient;s daughter regarding PT recommendation of SNF at time of discharge. Patient's daughter reported that she would like home health services (RN/PT/OT) instead of SNF and she is hiring 24/7 care. CSW will provide Medicare [facility] ratings list as she has no agency preference. CSW discussed equipment needs and she requested a rolling walker as patient uses a bedside commode at home already. CSW confirmed PCP and address. Patient's daughter will provide transport at discharge. No further questions reported at this time.    Expected Discharge Plan: [place] Barriers to Discharge: Continued Medical Work up   Patient Goals and CMS Choice Patient states their goals for this hospitalization and ongoing recovery are:: Return home CMS Medicare.gov Compare Post Acute Care list provided to:: Patient Represenative (must comment) (Daughter) Choice offered to / list presented to : Adult Children  Expected Discharge Plan and Services Expected Discharge Plan: [place] In-house Referral: Clinical Social Work Discharge Planning Services: CM Consult Post Acute Care Choice: [place] arrangements for the past 2 months: [place]                                      Prior Living Arrangements/Services Living arrangements for the past 2 months: Single Family Home Lives with:: Spouse, Adult Children Patient language and need for interpreter reviewed:: Yes Do you feel safe going back to the place where you live?: Yes      Need for Family Participation in Patient Care: Yes  (Comment) Care giver support system in place?: Yes (comment) Current home services: DME ([facility]) Criminal Activity/Legal Involvement Pertinent to Current Situation/Hospitalization: No - Comment as needed  Activities of Daily Living   ADL Screening (condition at time of admission) Patient's cognitive ability adequate to safely complete daily activities?: Yes Is the patient deaf or have difficulty hearing?: No Does the patient have difficulty seeing, even when wearing glasses/contacts?: Yes Does the patient have difficulty concentrating, remembering, or making decisions?: No Patient able to express need for assistance with ADLs?: Yes Does the patient have difficulty dressing or bathing?: Yes Independently performs ADLs?: No Communication: Independent Dressing (OT): Appropriate for developmental age, Needs assistance Is this a change from baseline?: Pre-admission baseline Grooming: Appropriate for developmental age, Needs assistance Feeding: Independent Bathing: Appropriate for developmental age 70: Appropriate for developmental age In/Out Bed: Appropriate for developmental age 56 in Home: Appropriate for developmental age Does the patient have difficulty walking or climbing stairs?: No Weakness of Legs: Both Weakness of Arms/Hands: None  Permission Sought/Granted Permission sought to share information with : Facility Sport and exercise psychologist, Family Supports Permission granted to share information with : No  Share Information with NAME: Orthoptist granted to share info w AGENCY: [place] granted to share info w Relationship: Daughter  Permission granted to share info w Contact Information: [phone]  Emotional Assessment   Attitude/Demeanor/Rapport: Unable to Assess Affect (typically observed): Unable to Assess Orientation: : Oriented to Self, Oriented  to Place, Oriented to  Time, Oriented to Situation Alcohol / Substance Use: Not Applicable Psych  Involvement: No (comment)  Admission diagnosis:  Acute hypoxemic respiratory failure (HCC) [J96.01] Acute respiratory failure due to COVID-19 ([place]) [U07.1, J96.00] COVID-19 [U07.1] Patient Active Problem List   Diagnosis Date Noted   COVID-19 10/12/2019   Acute hypoxemic respiratory failure ([place]) 10/11/2019   CKD (chronic kidney disease), stage III 06/15/2018   Low vitamin B12 level 12/09/2017   Anemia 08/16/2017   Chronic cough 08/12/2015   Perennial allergic rhinitis 08/12/2015   GERD (gastroesophageal reflux disease) 06/25/2014   Hyperglycemia 06/25/2014   Vitamin D deficiency 06/25/2014   Hypothyroidism 06/07/2014   Hyperlipidemia 06/07/2014   Intracervical pessary 06/07/2014   Sinus bradycardia 05/19/2013   PAD (peripheral artery disease) ([place]) 05/10/2013   PAC (premature atrial contraction) 05/20/2011   PAF (paroxysmal atrial fibrillation) ([place])    History of stroke    Hypertension    PCP:  [name], MD Pharmacy:   [facility] [address] [place] [address] [place] [address] Phone: [phone] Fax: [phone]  [facility] [place], [place] - [place] [address] [place] [address] Phone: [phone] Fax: [facility] [place], [place] - [address] AT [address] [place] [address] Phone: [phone] Fax: [phone]     Social Determinants of Health (SDOH) Interventions    Readmission Risk Interventions No flowsheet data found.

## 2019-10-17 NOTE — Progress Notes (Addendum)
Physical Therapy Treatment Patient Details Name: [name] MRN: [id number] DOB: January 14, 1926 Today's Date: 10/17/2019    History of Present Illness Pt is a 86 y.o. female presenting 10/11/19 with generalized weakness and SOB; tested (+) COVID-19 positive; pt unvaccinated. PMH includes PAF on Eliquis, CVA, PAD, HTN, HLD. Of note, pt's husband also admitted with COVID-19.   PT Comments    Pt slowly progressing with mobility, limited by significant fatigue this session. Pt requires up to minA for limited mobility with RW, can only tolerate walking a few steps before needing to rest. Per chart, daughter plans to have pt return home with 24/7 assist available and [facility] services. Recommend wheelchair due to limited activity tolerance. Will continue to follow acutely.  Sitting BP 131/110 Standing BP 97/82 (pt denies dizziness, endorses fatigue; RN notified)   Follow Up Recommendations  SNF;Supervision/Assistance - 24 hour (daughter declined SNF)     Equipment Recommendations  Rolling walker with 5" wheels;Wheelchair (measurements PT);Wheelchair cushion (measurements PT) (owns [facility])    Recommendations for Other Services       Precautions / Restrictions Precautions Precautions: Fall Restrictions Weight Bearing Restrictions: No    Mobility  Bed Mobility               General bed mobility comments: Received sitting in recliner  Transfers Overall transfer level: Needs assistance Equipment used: Rolling walker (2 wheeled) Transfers: Sit to/from Stand Sit to Stand: Min assist         General transfer comment: Heavy reliance on BUE support via armrests, minA to maintain balance when switching UE support to RW  Ambulation/Gait Ambulation/Gait assistance: Min assist Gait Distance (Feet): 4 Feet Assistive device: Rolling walker (2 wheeled) Gait Pattern/deviations: Step-to pattern;Shuffle;Trunk flexed Gait velocity: Decreased   General Gait Details: Bout of marching in place,  seated rest, then steps forwards/backwards with RW and minA for balance; pt limited by significant fatigue, "Can I sit down now... I need to sit down"   Stairs             Wheelchair Mobility    Modified Rankin (Stroke Patients Only)       Balance Overall balance assessment: Needs assistance Sitting-balance support: Feet supported;No upper extremity supported;Bilateral upper extremity supported Sitting balance-Leahy Scale: Poor Sitting balance - Comments: Reliant on UE support to maintain static sitting at edge of recliner   Standing balance support: Bilateral upper extremity supported Standing balance-Leahy Scale: Poor Standing balance comment: Reliant on BUE support                            Cognition Arousal/Alertness: Awake/alert Behavior During Therapy: Flat affect Overall Cognitive Status: No family/caregiver present to determine baseline cognitive functioning Area of Impairment: Attention;Following commands;Awareness;Problem solving                   Current Attention Level: Selective   Following Commands: Follows one step commands with increased time   Awareness: Emergent Problem Solving: Slow processing;Difficulty sequencing;Requires verbal cues General Comments: Pt with increased fatigue this session - likely exacerbating apparent cognitive deficits      Exercises      General Comments General comments (skin integrity, edema, etc.): SpO2 reading 100% via finger probe on RA. Pt denies dizziness with standing, but very fatigued; sitting BP 131/110, standing BP 97/82 (pt could not stay standing long enough for BP to finish reading before she sat)      Pertinent Vitals/Pain Pain Assessment: Faces  Faces Pain Scale: Hurts a little bit Pain Location: Generalized Pain Descriptors / Indicators: Tiring Pain Intervention(s): Monitored during session    Home Living                      Prior Function            PT Goals  (current goals can now be found in the care plan section) Acute Rehab PT Goals Patient Stated Goal: Home tomorrow Progress towards PT goals: Progressing toward goals    Frequency    Min 3X/week      PT Plan Current plan remains appropriate    Co-evaluation              AM-PAC PT "6 Clicks" Mobility   Outcome Measure  Help needed turning from your back to your side while in a flat bed without using bedrails?: A Little Help needed moving from lying on your back to sitting on the side of a flat bed without using bedrails?: A Little Help needed moving to and from a bed to a chair (including a wheelchair)?: A Little Help needed standing up from a chair using your arms (e.g., wheelchair or bedside chair)?: A Little Help needed to walk in hospital room?: A Little Help needed climbing 3-5 steps with a railing? : A Lot 6 Click Score: 17    End of Session Equipment Utilized During Treatment: Oxygen Activity Tolerance: Patient limited by fatigue Patient left: in chair;with call bell/phone within reach Nurse Communication: Mobility status PT Visit Diagnosis: Muscle weakness (generalized) (M62.81);Difficulty in walking, not elsewhere classified (R26.2)     Time: 0272-5366 PT Time Calculation (min) (ACUTE ONLY): 26 min  Charges:  $Therapeutic Exercise: 23-37 mins                     Mabeline Caras, PT, DPT Acute Rehabilitation Services  Pager [phone] Office [place] 10/17/2019, 5:24 PM

## 2019-10-18 DIAGNOSIS — N179 Acute kidney failure, unspecified: Secondary | ICD-10-CM

## 2019-10-18 LAB — CBC WITH DIFFERENTIAL/PLATELET
Abs Immature Granulocytes: 0.2 10*3/uL — ABNORMAL HIGH (ref 0.00–0.07)
Basophils Absolute: 0 10*3/uL (ref 0.0–0.1)
Basophils Relative: 0 %
Eosinophils Absolute: 0.1 10*3/uL (ref 0.0–0.5)
Eosinophils Relative: 1 %
HCT: 23.5 % — ABNORMAL LOW (ref 36.0–46.0)
Hemoglobin: 7.8 g/dL — ABNORMAL LOW (ref 12.0–15.0)
Immature Granulocytes: 2 %
Lymphocytes Relative: 10 %
Lymphs Abs: 0.9 10*3/uL (ref 0.7–4.0)
MCH: 28.5 pg (ref 26.0–34.0)
MCHC: 33.2 g/dL (ref 30.0–36.0)
MCV: 85.8 fL (ref 80.0–100.0)
Monocytes Absolute: 0.6 10*3/uL (ref 0.1–1.0)
Monocytes Relative: 7 %
Neutro Abs: 7.2 10*3/uL (ref 1.7–7.7)
Neutrophils Relative %: 80 %
Platelets: 381 10*3/uL (ref 150–400)
RBC: 2.74 MIL/uL — ABNORMAL LOW (ref 3.87–5.11)
RDW: 15 % (ref 11.5–15.5)
WBC: 8.9 10*3/uL (ref 4.0–10.5)
nRBC: 0.2 % (ref 0.0–0.2)

## 2019-10-18 LAB — COMPREHENSIVE METABOLIC PANEL
ALT: 30 U/L (ref 0–44)
AST: 32 U/L (ref 15–41)
Albumin: 2.6 g/dL — ABNORMAL LOW (ref 3.5–5.0)
Alkaline Phosphatase: 59 U/L (ref 38–126)
Anion gap: 13 (ref 5–15)
BUN: 85 mg/dL — ABNORMAL HIGH (ref 8–23)
CO2: 19 mmol/L — ABNORMAL LOW (ref 22–32)
Calcium: 8.5 mg/dL — ABNORMAL LOW (ref 8.9–10.3)
Chloride: 108 mmol/L (ref 98–111)
Creatinine, Ser: 2.74 mg/dL — ABNORMAL HIGH (ref 0.44–1.00)
GFR calc Af Amer: 17 mL/min — ABNORMAL LOW (ref >60)
GFR calc non Af Amer: 15 mL/min — ABNORMAL LOW (ref >60)
Glucose, Bld: 99 mg/dL (ref 70–99)
Potassium: 3.9 mmol/L (ref 3.5–5.1)
Sodium: 140 mmol/L (ref 135–145)
Total Bilirubin: 0.8 mg/dL (ref 0.3–1.2)
Total Protein: 5.5 g/dL — ABNORMAL LOW (ref 6.5–8.1)

## 2019-10-18 LAB — MAGNESIUM: Magnesium: 2.7 mg/dL — ABNORMAL HIGH (ref 1.7–2.4)

## 2019-10-18 LAB — D-DIMER, QUANTITATIVE: D-Dimer, Quant: 0.45 ug/mL-FEU (ref 0.00–0.50)

## 2019-10-18 LAB — BRAIN NATRIURETIC PEPTIDE: B Natriuretic Peptide: 678.5 pg/mL — ABNORMAL HIGH (ref 0.0–100.0)

## 2019-10-18 LAB — C-REACTIVE PROTEIN: CRP: 11.6 mg/dL — ABNORMAL HIGH (ref <1.0)

## 2019-10-18 MED ORDER — ENSURE ENLIVE PO LIQD
237.0000 mL | Freq: Two times a day (BID) | ORAL | Status: DC
  Administered 2019-10-18: 237 mL via ORAL

## 2019-10-18 MED ORDER — KATE FARMS STANDARD 1.4 PO LIQD: 325.0000 mL | Freq: Three times a day (TID) | ORAL | Status: DC

## 2019-10-18 MED ORDER — PANTOPRAZOLE SODIUM 40 MG PO TBEC: 40.0000 mg | DELAYED_RELEASE_TABLET | Freq: Two times a day (BID) | ORAL | 0 refills | Status: DC

## 2019-10-18 MED ORDER — APIXABAN 2.5 MG PO TABS
2.5000 mg | ORAL_TABLET | Freq: Two times a day (BID) | ORAL | Status: DC
  Administered 2019-10-18: 2.5 mg via ORAL
  Filled 2019-10-18: qty 1

## 2019-10-18 NOTE — Progress Notes (Signed)
Son called and he will pick her up to home. PTAR is cancelled.

## 2019-10-18 NOTE — Progress Notes (Signed)
ANTICOAGULATION CONSULT NOTE  Pharmacy Consult for heparin>>apixaban Indication: atrial fibrillation  Patient Measurements: Height: 5\' 3"  (160 cm) Weight: 53.3 kg (117 lb 8.1 oz) IBW/kg (Calculated) : 52.4 Heparin Dosing Weight: 53kg  Vital Signs: Temp: 98.2 F (36.8 C) (08/25 0523) Temp Source: Oral (08/25 0513) BP: 117/55 (08/25 0523) Pulse Rate: 99 (08/25 0523)  Labs: Recent Labs    10/16/19 0347 10/16/19 0347 10/16/19 1516 10/17/19 0200 10/17/19 0312 10/17/19 1244  HGB 7.4*  --   --   --  7.8*  --   HCT 22.0*  --   --   --  24.0*  --   PLT 399  --   --   --  390  --   APTT 134*   < > 47* 65*  --  90*  HEPARINUNFRC 1.54*  --   --   --  1.07*  --   CREATININE 2.97*  --   --   --  2.82*  --    < > = values in this interval not displayed.    Estimated Creatinine Clearance: 10.7 mL/min (A) (by C-G formula based on SCr of 2.82 mg/dL (H)).  Assessment: 74 yoF with hx AFib on apixaban PTA admitted with COVID-19 respiratory failure and AKI. Apixaban was held and IV heparin started. Pharmacy consulted to restart apixaban 8/25. Labs pending this morning.   Goal of Therapy:  Monitor platelets by anticoagulation protocol: Yes   Plan:  Stop heparin when apixaban is given at 10am per standard hospital administration time  Start apixaban 2.5mg  BID (PTA dose)  Monitor for s/sx bleeding   Benetta Spar, PharmD, BCPS, BCCP Clinical Pharmacist  Please check AMION for all [place] phone numbers After 10:00 PM, call [place] [phone]

## 2019-10-18 NOTE — Progress Notes (Signed)
Physical Therapy Treatment Patient Details Name: [name] MRN: [id number] DOB: 05-01-1925 Today's Date: 10/18/2019    History of Present Illness Pt is a 86 y.o. female presenting 10/11/19 with generalized weakness and SOB; tested (+) COVID-19 positive; pt unvaccinated. PMH includes PAF on Eliquis, CVA, PAD, HTN, HLD. Of note, pt's husband also admitted with COVID-19.   PT Comments    Pt slowly progressing with mobility; remains limited by significant fatigue, generalized weakness, decreased activity tolerance, hypotension and nausea. Pt requires minA for limited mobility with RW, only able to walk 8' before needing to sit and rest. Recommend use of w/c for home use. Post-mobility BP 99/60, RN notified. Patient reports, "I'm not ready to go home today... I'm took weak... maybe tomorrow."    Follow Up Recommendations  SNF;Supervision/Assistance - 24 hour (daughter declined SNF)     Equipment Recommendations  Rolling walker with 5" wheels;Wheelchair (measurements PT);Wheelchair cushion (measurements PT)    Recommendations for Other Services       Precautions / Restrictions Precautions Precautions: Fall;Other (comment) Precaution Comments: Hypotensive, very quick to fatigue Restrictions Weight Bearing Restrictions: No    Mobility  Bed Mobility               General bed mobility comments: Received sitting in recliner  Transfers Overall transfer level: Needs assistance Equipment used: Rolling walker (2 wheeled) Transfers: Sit to/from Stand Sit to Stand: Min assist         General transfer comment: Stood from recliner and BSC with minA for trunk elevation and stability, good carryover of hand placement this session, heavy reliance on BUE support  Ambulation/Gait Ambulation/Gait assistance: Min Web designer (Feet): 16 Feet Assistive device: Rolling walker (2 wheeled) Gait Pattern/deviations: Step-to pattern;Shuffle;Trunk flexed Gait velocity:  Decreased Gait velocity interpretation: <1.31 ft/sec, indicative of household ambulator General Gait Details: Amb 8' to [facility] and 8' back to recliner with prolonged seated rest, RW and frequent minA for stability; max cues to keep walking as pt very fatigued   Chief Strategy Officer    Modified Rankin (Stroke Patients Only)       Balance Overall balance assessment: Needs assistance Sitting-balance support: Feet supported;No upper extremity supported;Bilateral upper extremity supported Sitting balance-Leahy Scale: Poor Sitting balance - Comments: Reliant on UE support to maintain static sitting at edge of recliner   Standing balance support: Bilateral upper extremity supported Standing balance-Leahy Scale: Poor Standing balance comment: Reliant on BUE support                            Cognition Arousal/Alertness: Awake/alert (awake but very fatigued) Behavior During Therapy: Flat affect Overall Cognitive Status: No family/caregiver present to determine baseline cognitive functioning Area of Impairment: Attention;Following commands;Awareness;Problem solving;Safety/judgement                   Current Attention Level: Sustained;Selective   Following Commands: Follows one step commands with increased time Safety/Judgement: Decreased awareness of deficits Awareness: Emergent Problem Solving: Slow processing;Difficulty sequencing;Requires verbal cues General Comments: Very fatigued likely exacerbating apparent cognitive impairments      Exercises      General Comments General comments (skin integrity, edema, etc.): Post-mobility BP 99/60. Pt reports "I'm not ready to go today, I'm too weak; maybe tomorrow." Per chart, daughter planning to have pt d/c home today. Spoke with pt regarding recommendation for w/c  Pertinent Vitals/Pain Pain Assessment: Faces Faces Pain Scale: Hurts a little bit Pain Location: Generalized Pain  Descriptors / Indicators: Tiring Pain Intervention(s): Monitored during session    Home Living                      Prior Function            PT Goals (current goals can now be found in the care plan section) Progress towards PT goals: Progressing toward goals (slowly)    Frequency    Min 3X/week      PT Plan Current plan remains appropriate    Co-evaluation              AM-PAC PT "6 Clicks" Mobility   Outcome Measure  Help needed turning from your back to your side while in a flat bed without using bedrails?: A Little Help needed moving from lying on your back to sitting on the side of a flat bed without using bedrails?: A Little Help needed moving to and from a bed to a chair (including a wheelchair)?: A Little Help needed standing up from a chair using your arms (e.g., wheelchair or bedside chair)?: A Little Help needed to walk in hospital room?: A Little Help needed climbing 3-5 steps with a railing? : A Lot 6 Click Score: 17    End of Session   Activity Tolerance: Patient limited by fatigue Patient left: in chair;with call bell/phone within reach Nurse Communication: Mobility status PT Visit Diagnosis: Muscle weakness (generalized) (M62.81);Difficulty in walking, not elsewhere classified (R26.2)     Time: 9826-4158 PT Time Calculation (min) (ACUTE ONLY): 22 min  Charges:  $Therapeutic Activity: 8-22 mins                    Mabeline Caras, PT, DPT Acute Rehabilitation Services  Pager [phone] Office [phone]  Derry Lory 10/18/2019, 2:24 PM

## 2019-10-18 NOTE — Care Management (Signed)
Spoke w daughter over the phone, confirmed DC plans made yesterday.  Verified that patient is ready for DC with the nurse. PTAR called for pickup.

## 2019-10-18 NOTE — Discharge Summary (Signed)
[name], is a 86 y.o. female  DOB [birth year]  MRN [id number].  Admission date:  10/11/2019  Admitting Physician  Thurnell Lose, MD  Discharge Date:  10/18/2019   Primary MD  [name], MD  Recommendations for primary care physician for things to follow:  -Antihypertensive regimen has been held given soft blood pressure, Cardizem has been continued for a heart rate control, please adjust as needed.   Admission Diagnosis  Acute hypoxemic respiratory failure (HCC) [J96.01] Acute respiratory failure due to COVID-19 ([place]) [U07.1, J96.00] COVID-19 [U07.1]   Discharge Diagnosis  Acute hypoxemic respiratory failure ([place]) [J96.01] Acute respiratory failure due to COVID-19 ([place]) [U07.1, J96.00] COVID-19 [U07.1]    Active Problems:   Acute hypoxemic respiratory failure ([place])   COVID-19      Past Medical History:  Diagnosis Date  . Arthritis   . Chicken pox   . Glaucoma of both eyes   . High cholesterol   . Hypertension    a. Normal renal arteries by duplex 2013.  [name] Hypothyroidism   . Migraine   . PAD (peripheral artery disease) ([employer])    a. 04/2013: s/p PCI to occluded right popliteal artery   . PAF (paroxysmal atrial fibrillation) ([place])    a. s/p MAZE 2006. b. Event monitor 2013: NSR with PACs.  . Sinus bradycardia    a. 03/2011 during hospital admission - beta blocker stopped.  . Stroke [facility])    a. Pt reports 3-4 prior strokes without residual sx. b. CVA 2013 (presented with headache) - started on Plavix. Event monitor as outpt - no AF.    Past Surgical History:  Procedure Laterality Date  . CATARACT EXTRACTION Left 11/09/2017  . LOWER EXTREMITY ANGIOGRAM N/A 05/15/2013   Procedure: LOWER EXTREMITY ANGIOGRAM;  Surgeon: Lorretta Harp, MD;  Location: [facility] CATH LAB;  Service: Cardiovascular;  Laterality: N/A;  . MAZE  ~ 2006  . POPLITEAL ARTERY STENT  05/15/2013        History of present illness and  Hospital Course:     Kindly see H&P for history of present illness and admission details, please review complete Labs, Consult reports and Test reports for all details in brief  HPI  from the history and physical done on the day of admission 10/11/2019  HPI: [name] is a 86 y.o. female with medical history significant of paroxysmal A. fib on Eliquis, prior CVA, PAD, HTN, HLD was brought into the hospital for generalized weakness as well as mild shortness of breath.  Patient has been feeling sick for the past week.  She tells me that her husband tested positive for Covid about couple weeks ago requiring hospitalization and currently in rehab.  She has been getting progressively weaker, more short of breath, coughing and having mild nausea without vomiting.  She denies any fever or chills, denies any cough or chest congestion.  No loss of taste or smell.  She was not vaccinated for Covid due to concern for her multiple allergies.  ED Course: In the ER she is afebrile at 98.5, tachypneic with respiratory rate in the mid 20s, and satting at 90% on room air and required 2 L nasal cannula which helped her sats 200%.  She is normotensive.  Blood work reveals creatinine 1.54, LDH 1021, high-sensitivity troponin at 45, elevated ferritin and CRP of 19.  Her procalcitonin is 2.17. Chest x-ray showed bilateral lower lobe airspace disease concerning for atypical viral pneumonia.  Covid test is pending but there is extremely high suspicion for Covid pneumonia and we are asked for admits.   Hospital Course   Acute Hypoxic Resp. Failure due to Acute Covid 19 Viral Pneumonitis during the ongoing 2020 Covid 19 Pandemic  - she used to have moderate disease, she has been appropriately started on steroids and Remdesivir, as well she did require some as needed Lasix . -Hypoxia has resolved, oxygen requirement .  Underlying history of paroxysmal atrial fibrillation.   Mali vas 2 score of greater than 4.   -He was kept on Cardizem for heart rate control,  stable TSH ,  Nischal heparin drip, now she is back on her home dose Eliquis.    Hypothyroidism.  On thyroid supplementation which will be continued.  Elevated D-dimer due to underlying inflammation.  -She is on Eliquis at home for A. Fib.  AKI on CKD 3B.  Baseline creatinine around 1.5.   nonacute renal ultrasound.  2.74 on discharge, ARB has been stopped on discharge as well, avoid hypotension  Bladder outlet obstruction.  She was treated with Foley catheter and Flomax during hospital stay, Foley catheter was discontinued this morning, and patient did make urine after Foley was discontinued, with no evidence of recurrent retention  HX of CVA.  Currently stable on Eliquis for secondary prevention.  I have clarified with the daughter, patient is no more on Plavix  Acute on chronic normocytic anemia.  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  Outpatient age-appropriate follow-up by PCP as needed.  Deconditioning.  Qualified for SNF but daughter wants to take her home with home health which has been arranged, patient wishes for her mother to be discharged home today.  Discharge Condition: Stable.  But she is high risk for readmission given her generalized debility, commendation has been made for SNF, but daughter wishes to take patient home.   Follow UP   Follow-up Information    [name], MD Follow up in 2 week(s).   Specialty: Family Medicine Contact information: [place] [phone]                 Discharge Instructions  and  Discharge Medications     Discharge Instructions    Discharge instructions   Complete by: As directed    Follow with Primary MD [name], MD in 14 days   Get CBC, CMP,  checked  by Primary MD next visit.    Activity: As  tolerated with Full fall precautions use walker/cane & assistance as needed   Disposition Home    Diet: Heart Healthy, dysphagia 3 with thin liquids, with feeding assistance and aspiration precautions.  On your next visit with your primary care physician please Get Medicines reviewed and adjusted.   Please request your Prim.MD to go over all Hospital Tests and Procedure/Radiological results at the follow up, please get all Hospital records sent to your Prim MD by signing hospital release  before you go home.   If you experience worsening of your admission symptoms, develop shortness of breath, life threatening emergency, suicidal or homicidal thoughts you must seek medical attention immediately by calling 911 or calling your MD immediately  if symptoms less severe.  You Must read complete instructions/literature along with all the possible adverse reactions/side effects for all the Medicines you take and that have been prescribed to you. Take any new Medicines after you have completely understood and accpet all the possible adverse reactions/side effects.   Do not drive, operating heavy machinery, perform activities at heights, swimming or participation in water activities or provide baby sitting services if your were admitted for syncope or siezures until you have seen by Primary MD or a Neurologist and advised to do so again.  Do not drive when taking Pain medications.    Do not take more than prescribed Pain, Sleep and Anxiety Medications  Special Instructions: If you have smoked or chewed Tobacco  in the last 2 yrs please stop smoking, stop any regular Alcohol  and or any Recreational drug use.  Wear Seat belts while driving.   Please note  You were cared for by a hospitalist during your hospital stay. If you have any questions about your discharge medications or the care you received while you were in the hospital after you are discharged, you can call the unit and asked to speak  with the hospitalist on call if the hospitalist that took care of you is not available. Once you are discharged, your primary care physician will handle any further medical issues. Please note that NO REFILLS for any discharge medications will be authorized once you are discharged, as it is imperative that you return to your primary care physician (or establish a relationship with a primary care physician if you do not have one) for your aftercare needs so that they can reassess your need for medications and monitor your lab values.     Allergies as of 10/18/2019      Reactions   Aceon [perindopril Erbumine] Cough   Amlodipine Other (See Comments)   Lower extremity discomfort   Aspirin Other (See Comments), Cough   STOPPED BY A MEDICAL PROFESSIONAL   Azor [amlodipine-olmesartan] Cough   Beta Adrenergic Blockers Other (See Comments)   Bradycardia   Hydralazine Nausea And Vomiting, Other (See Comments)   Weakness- tolerating in 2021   Milk-related Compounds Other (See Comments), Cough   STOPPED BY A MEDICAL PROFESSIONAL   Other Other (See Comments), Cough   NUTS- ALL TYPES- (STOPPED BY A MEDICAL PROFESSIONAL) NO FOODS THAT CAUSE GAS (will cause A-Fib)   Peanut-containing Drug Products Other (See Comments), Cough   STOPPED BY A MEDICAL PROFESSIONAL   Sulfamethoxazole-trimethoprim Other (See Comments)   Patient cannot specifically recall the reaction   Vitamin D Analogs Cough      Medication List    STOP taking these medications   chlorthalidone 25 MG tablet Commonly known as: HYGROTON   clopidogrel 75 MG tablet Commonly known as: PLAVIX   hydrALAZINE 25 MG tablet Commonly known as: APRESOLINE   irbesartan 300 MG tablet Commonly known as: Avapro     TAKE these medications   apixaban 2.5 MG Tabs tablet Commonly known as: ELIQUIS Take 1 tablet (2.5 mg total) by mouth 2 (two) times daily.   Armour Thyroid 15 MG tablet Generic drug: thyroid TAKE 1 TABLET BY MOUTH EVERY  DAY What changed:   how much to take  when to take  this   Digestive Enzyme Caps Take 1 capsule by mouth See admin instructions. Mix the contents of 1 capsule into water and drink with all meals   diltiazem 120 MG 24 hr capsule Commonly known as: CARDIZEM CD Take 1 capsule (120 mg total) by mouth daily.   dorzolamide-timolol 22.3-6.8 MG/ML ophthalmic solution Commonly known as: COSOPT Place 1 drop into the left eye in the morning and at bedtime.   feeding supplement (KATE FARMS STANDARD 1.4) Liqd liquid Take 325 mLs by mouth 3 (three) times daily between meals.   multivitamin Liqd Take 30 mLs by mouth daily.   pantoprazole 40 MG tablet Commonly known as: PROTONIX Take 1 tablet (40 mg total) by mouth 2 (two) times daily.   Papaya Tabs Take 3 tablets by mouth 3 (three) times daily after meals.   Probiotic Caps Take 1 capsule by mouth daily.   rosuvastatin 5 MG tablet Commonly known as: CRESTOR Take 1 tablet (5 mg total) by mouth once a week. What changed: when to take this   Vitamin C 500 MG/5ML Liqd Take 1,000 mg by mouth daily.            Durable Medical Equipment  (From admission, onward)         Start     Ordered   10/17/19 1137  For home use only DME 4 wheeled rolling walker with seat  Once       Question:  Patient needs a walker to treat with the following condition  Answer:  Weakness   10/17/19 1136            Diet and Activity recommendation: See Discharge Instructions above   Consults obtained -  None   Major procedures and Radiology Reports - PLEASE review detailed and final reports for all details, in brief -     US RENAL  Result Date: 10/15/2019 CLINICAL DATA:  Acute renal insufficiency EXAM: RENAL / URINARY TRACT ULTRASOUND COMPLETE COMPARISON:  09/23/2005 FINDINGS: Right Kidney: Renal measurements: 9.2 x 3.8 by 3.6 cm = volume: 65.4 mL. Echogenicity within normal limits. There is a hyperechoic circumscribed mass within the ventral  aspect of the mid kidney measuring 0.8 x 0.8 x 1.0 cm, most consistent with renal angiomyolipoma. No hydronephrosis or nephrolithiasis. Left Kidney: Renal measurements: 8.0 x 3.9 x 3.6 cm = volume: 56.2 mL. There is an exophytic simple cyst off the lower pole left kidney measuring 2.6 x 2.5 x 2.5 cm. No hydronephrosis or nephrolithiasis. Echogenicity within normal limits. Bladder: Bladder is decompressed with a Foley catheter. Other: None. IMPRESSION: 1. 1 cm hyperechoic solid lesion right kidney, compatible with renal angiomyolipoma. 2. Simple left renal cyst. 3. Normal renal echotexture. Electronically Signed   By: Randa Ngo M.D.   On: 10/15/2019 15:37   DG Chest Port 1 View  Result Date: 10/14/2019 CLINICAL DATA:  COVID pneumonia EXAM: PORTABLE CHEST 1 VIEW COMPARISON:  Three days ago FINDINGS: Improved lung volumes and improved patchy airspace disease. No effusion or pneumothorax. Normal heart size and mediastinal contours. IMPRESSION: Improved inflation and infiltrates. Electronically Signed   By: Monte Fantasia M.D.   On: 10/14/2019 10:04   DG Chest Port 1 View  Result Date: 10/11/2019 CLINICAL DATA:  Weakness shortness of breath for 1 week EXAM: PORTABLE CHEST 1 VIEW COMPARISON:  05/16/2019 FINDINGS: Right lower lobe and left lower lung hazy airspace disease. No pleural effusion or pneumothorax. Stable cardiomediastinal silhouette. No aggressive osseous lesion. IMPRESSION: Bilateral lower lobe hazy airspace disease concerning  for pneumonia including atypical viral pneumonia. Electronically Signed   By: Kathreen Devoid   On: 10/11/2019 14:48    Micro Results    Recent Results (from the past 240 hour(s))  SARS Coronavirus 2 by RT PCR (hospital order, performed in [facility] hospital lab) Nasopharyngeal Nasopharyngeal Swab     Status: Abnormal   Collection Time: 10/11/19  1:48 PM   Specimen: Nasopharyngeal Swab  Result Value Ref Range Status   SARS Coronavirus 2 POSITIVE (A) NEGATIVE Final     Comment: RESULT CALLED TO, READ BACK BY AND VERIFIED WITH: M,FOUNTAIN @1631  10/11/19 EB (NOTE) SARS-CoV-2 target nucleic acids are DETECTED  SARS-CoV-2 RNA is generally detectable in upper respiratory specimens  during the acute phase of infection.  Positive results are indicative  of the presence of the identified virus, but do not rule out bacterial infection or co-infection with other pathogens not detected by the test.  Clinical correlation with patient history and  other diagnostic information is necessary to determine patient infection status.  The expected result is negative.  Fact Sheet for Patients:   StrictlyIdeas.no   Fact Sheet for Healthcare Providers:   BankingDealers.co.za    This test is not yet approved or cleared by the [place] FDA and  has been authorized for detection and/or diagnosis of SARS-CoV-2 by FDA under an Emergency Use Authorization (EUA).  This EUA will remain in effect (meaning this test can  be used) for the duration of  the COVID-19 declaration under Section 564(b)(1) of the Act, 21 U.S.C. section 360-bbb-3(b)(1), unless the authorization is terminated or revoked sooner.  Performed at [facility], [place] [address], [place], [place] [address]   Blood Culture (routine x 2)     Status: None   Collection Time: 10/11/19  1:50 PM   Specimen: BLOOD RIGHT FOREARM  Result Value Ref Range Status   Specimen Description BLOOD RIGHT FOREARM  Final   Special Requests   Final    BOTTLES DRAWN AEROBIC AND ANAEROBIC Blood Culture adequate volume   Culture   Final    NO GROWTH 5 DAYS Performed at [facility], [place] [address], [place], [place] [address]    Report Status 10/16/2019 FINAL  Final       Today   Subjective:   Aeliana Spates today has no headache,no chest abdominal pain,no new weakness tingling or numbness, feels much better wants to go home today.   Objective:   Blood  pressure (!) 102/59, pulse 99, temperature 98.2 F (36.8 C), resp. rate 18, height 5\' 3"  (1.6 m), weight 53.3 kg, SpO2 100 %.   Intake/Output Summary (Last 24 hours) at 10/18/2019 1244 Last data filed at 10/18/2019 0500 Gross per 24 hour  Intake --  Output 950 ml  Net -950 ml    Exam Awake Alert, Oriented x 3, frail, deconditioned, in no apparent distress . Symmetrical Chest wall movement, Good air movement bilaterally, CTAB RRR,No Gallops,Rubs or new Murmurs, No Parasternal Heave +ve B.Sounds, Abd Soft, Non tender, No rebound -guarding or rigidity. No Cyanosis, Clubbing or edema, No new Rash or bruise  Data Review   CBC w Diff:  Lab Results  Component Value Date   WBC 8.9 10/18/2019   HGB 7.8 (L) 10/18/2019   HGB 11.7 05/16/2019   HCT 23.5 (L) 10/18/2019   HCT 35.6 05/16/2019   PLT 381 10/18/2019   PLT 197 05/16/2019   LYMPHOPCT 10 10/18/2019   MONOPCT 7 10/18/2019   EOSPCT  1 10/18/2019   BASOPCT 0 10/18/2019    CMP:  Lab Results  Component Value Date   NA 140 10/18/2019   NA 139 05/16/2019   K 3.9 10/18/2019   CL 108 10/18/2019   CO2 19 (L) 10/18/2019   BUN 85 (H) 10/18/2019   BUN 30 05/16/2019   CREATININE 2.74 (H) 10/18/2019   CREATININE 0.87 05/10/2013   GLU 92 03/09/2013   PROT 5.5 (L) 10/18/2019   ALBUMIN 2.6 (L) 10/18/2019   BILITOT 0.8 10/18/2019   ALKPHOS 59 10/18/2019   AST 32 10/18/2019   ALT 30 10/18/2019  .   Total Time in preparing paper work, data evaluation and todays exam - 58 minutes  Phillips Climes M.D on 10/18/2019 at 12:44 PM  [facility]   Office  [phone]

## 2019-10-18 NOTE — Consult Note (Signed)
   [facility] Inpatient Consult   10/18/2019  [name] 11-28-1925 [id number]  [facility] [ACO] Patient:  Medicare NextGen  Patient screened for hospitalizations of 7 days to check if potential [place] [employer] service needs.  Review of patient's medical record reveals patient is for home with home health.  This Probation officer reached out to daughter [name] at [phone] to explain about [facility] support for post hospital care and disease management and sent a message with the website information.  Medical record reviewed of Transition of Care RN noted.  Primary Care Provider is  Garret Reddish, MD is an affiliate of [facility] and  this provider is listed to provide the transition of care [TOC] for post hospital follow up.  Plan:  Send information to the home address for further review of daughter.  For questions contact:   Natividad Brood, RN BSN [facility] Liaison  [phone] business mobile phone Toll free office [phone]  Fax number: [phone] [email or web address]

## 2019-10-18 NOTE — Discharge Instructions (Signed)
Person Under Monitoring Name: [name]  Location: [address] [place] [address]   Infection Prevention Recommendations for Individuals Confirmed to have, or Being Evaluated for, 2019 Novel Coronavirus (COVID-19) Infection Who Receive Care at Home  Individuals who are confirmed to have, or are being evaluated for, COVID-19 should follow the prevention steps below until a healthcare provider or local or state health department says they can return to normal activities.  Stay home except to get medical care You should restrict activities outside your home, except for getting medical care. Do not go to work, school, or public areas, and do not use public transportation or taxis.  Call ahead before visiting your doctor Before your medical appointment, call the healthcare provider and tell them that you have, or are being evaluated for, COVID-19 infection. This will help the healthcare provider's office take steps to keep other people from getting infected. Ask your healthcare provider to call the local or state health department.  Monitor your symptoms Seek prompt medical attention if your illness is worsening (e.g., difficulty breathing). Before going to your medical appointment, call the healthcare provider and tell them that you have, or are being evaluated for, COVID-19 infection. Ask your healthcare provider to call the local or state health department.  Wear a facemask You should wear a facemask that covers your nose and mouth when you are in the same room with other people and when you visit a healthcare provider. People who live with or visit you should also wear a facemask while they are in the same room with you.  Separate yourself from other people in your home As much as possible, you should stay in a different room from other people in your home. Also, you should use a separate bathroom, if available.  Avoid sharing household items You should not  share dishes, drinking glasses, cups, eating utensils, towels, bedding, or other items with other people in your home. After using these items, you should wash them thoroughly with soap and water.  Cover your coughs and sneezes Cover your mouth and nose with a tissue when you cough or sneeze, or you can cough or sneeze into your sleeve. Throw used tissues in a lined trash can, and immediately wash your hands with soap and water for at least 20 seconds or use an alcohol-based hand rub.  Wash your [facility] your hands often and thoroughly with soap and water for at least 20 seconds. You can use an alcohol-based hand sanitizer if soap and water are not available and if your hands are not visibly dirty. Avoid touching your eyes, nose, and mouth with unwashed hands.   Prevention Steps for Caregivers and Household Members of Individuals Confirmed to have, or Being Evaluated for, COVID-19 Infection Being Cared for in the Home  If you live with, or provide care at home for, a person confirmed to have, or being evaluated for, COVID-19 infection please follow these guidelines to prevent infection:  Follow healthcare provider's instructions Make sure that you understand and can help the patient follow any healthcare provider instructions for all care.  Provide for the patient's basic needs You should help the patient with basic needs in the home and provide support for getting groceries, prescriptions, and other personal needs.  Monitor the patient's symptoms If they are getting sicker, call his or her medical provider and tell them that the patient has, or is being evaluated for, COVID-19 infection. This will help the healthcare provider's  office take steps to keep other people from getting infected. Ask the healthcare provider to call the local or state health department.  Limit the number of people who have contact with the patient  If possible, have only one caregiver for the  patient.  Other household members should stay in another home or place of residence. If this is not possible, they should stay  in another room, or be separated from the patient as much as possible. Use a separate bathroom, if available.  Restrict visitors who do not have an essential need to be in the home.  Keep older adults, very young children, and other sick people away from the patient Keep older adults, very young children, and those who have compromised immune systems or chronic health conditions away from the patient. This includes people with chronic heart, lung, or kidney conditions, diabetes, and cancer.  Ensure good ventilation Make sure that shared spaces in the home have good air flow, such as from an air conditioner or an opened window, weather permitting.  Wash your hands often  Wash your hands often and thoroughly with soap and water for at least 20 seconds. You can use an alcohol based hand sanitizer if soap and water are not available and if your hands are not visibly dirty.  Avoid touching your eyes, nose, and mouth with unwashed hands.  Use disposable paper towels to dry your hands. If not available, use dedicated cloth towels and replace them when they become wet.  Wear a facemask and gloves  Wear a disposable facemask at all times in the room and gloves when you touch or have contact with the patient's blood, body fluids, and/or secretions or excretions, such as sweat, saliva, sputum, nasal mucus, vomit, urine, or feces.  Ensure the mask fits over your nose and mouth tightly, and do not touch it during use.  Throw out disposable facemasks and gloves after using them. Do not reuse.  Wash your hands immediately after removing your facemask and gloves.  If your personal clothing becomes contaminated, carefully remove clothing and launder. Wash your hands after handling contaminated clothing.  Place all used disposable facemasks, gloves, and other waste in a lined  container before disposing them with other household waste.  Remove gloves and wash your hands immediately after handling these items.  Do not share dishes, glasses, or other household items with the patient  Avoid sharing household items. You should not share dishes, drinking glasses, cups, eating utensils, towels, bedding, or other items with a patient who is confirmed to have, or being evaluated for, COVID-19 infection.  After the person uses these items, you should wash them thoroughly with soap and water.  Wash laundry thoroughly  Immediately remove and wash clothes or bedding that have blood, body fluids, and/or secretions or excretions, such as sweat, saliva, sputum, nasal mucus, vomit, urine, or feces, on them.  Wear gloves when handling laundry from the patient.  Read and follow directions on labels of laundry or clothing items and detergent. In general, wash and dry with the warmest temperatures recommended on the label.  Clean all areas the individual has used often  Clean all touchable surfaces, such as counters, tabletops, doorknobs, bathroom fixtures, toilets, phones, keyboards, tablets, and bedside tables, every day. Also, clean any surfaces that may have blood, body fluids, and/or secretions or excretions on them.  Wear gloves when cleaning surfaces the patient has come in contact with.  Use a diluted bleach solution (e.g., dilute bleach with 1  part bleach and 10 parts water) or a household disinfectant with a label that says EPA-registered for coronaviruses. To make a bleach solution at home, add 1 tablespoon of bleach to 1 quart (4 cups) of water. For a larger supply, add  cup of bleach to 1 gallon (16 cups) of water.  Read labels of cleaning products and follow recommendations provided on product labels. Labels contain instructions for safe and effective use of the cleaning product including precautions you should take when applying the product, such as wearing gloves or  eye protection and making sure you have good ventilation during use of the product.  Remove gloves and wash hands immediately after cleaning.  Monitor yourself for signs and symptoms of illness Caregivers and household members are considered close contacts, should monitor their health, and will be asked to limit movement outside of the home to the extent possible. Follow the monitoring steps for close contacts listed on the symptom monitoring form.   ? If you have additional questions, contact your local health department or call the epidemiologist on call at [phone] (available 24/7). ? This guidance is subject to change. For the most up-to-date guidance from [facility], please refer to their website: [email or web address]

## 2019-10-19 ENCOUNTER — Telehealth: Payer: Self-pay

## 2019-10-19 DIAGNOSIS — E7800 Pure hypercholesterolemia, unspecified: Secondary | ICD-10-CM | POA: Diagnosis not present

## 2019-10-19 DIAGNOSIS — I129 Hypertensive chronic kidney disease with stage 1 through stage 4 chronic kidney disease, or unspecified chronic kidney disease: Secondary | ICD-10-CM | POA: Diagnosis not present

## 2019-10-19 DIAGNOSIS — D1771 Benign lipomatous neoplasm of kidney: Secondary | ICD-10-CM | POA: Diagnosis not present

## 2019-10-19 DIAGNOSIS — L89152 Pressure ulcer of sacral region, stage 2: Secondary | ICD-10-CM | POA: Diagnosis not present

## 2019-10-19 DIAGNOSIS — M1990 Unspecified osteoarthritis, unspecified site: Secondary | ICD-10-CM | POA: Diagnosis not present

## 2019-10-19 DIAGNOSIS — J9601 Acute respiratory failure with hypoxia: Secondary | ICD-10-CM | POA: Diagnosis not present

## 2019-10-19 DIAGNOSIS — N179 Acute kidney failure, unspecified: Secondary | ICD-10-CM | POA: Diagnosis not present

## 2019-10-19 DIAGNOSIS — N320 Bladder-neck obstruction: Secondary | ICD-10-CM | POA: Diagnosis not present

## 2019-10-19 DIAGNOSIS — G43909 Migraine, unspecified, not intractable, without status migrainosus: Secondary | ICD-10-CM | POA: Diagnosis not present

## 2019-10-19 DIAGNOSIS — E039 Hypothyroidism, unspecified: Secondary | ICD-10-CM | POA: Diagnosis not present

## 2019-10-19 DIAGNOSIS — Z7901 Long term (current) use of anticoagulants: Secondary | ICD-10-CM | POA: Diagnosis not present

## 2019-10-19 DIAGNOSIS — I739 Peripheral vascular disease, unspecified: Secondary | ICD-10-CM | POA: Diagnosis not present

## 2019-10-19 DIAGNOSIS — U071 COVID-19: Secondary | ICD-10-CM | POA: Diagnosis not present

## 2019-10-19 DIAGNOSIS — I480 Paroxysmal atrial fibrillation: Secondary | ICD-10-CM | POA: Diagnosis not present

## 2019-10-19 DIAGNOSIS — D649 Anemia, unspecified: Secondary | ICD-10-CM | POA: Diagnosis not present

## 2019-10-19 DIAGNOSIS — N1832 Chronic kidney disease, stage 3b: Secondary | ICD-10-CM | POA: Diagnosis not present

## 2019-10-19 DIAGNOSIS — Z8673 Personal history of transient ischemic attack (TIA), and cerebral infarction without residual deficits: Secondary | ICD-10-CM | POA: Diagnosis not present

## 2019-10-19 DIAGNOSIS — J1282 Pneumonia due to coronavirus disease 2019: Secondary | ICD-10-CM | POA: Diagnosis not present

## 2019-10-19 DIAGNOSIS — H409 Unspecified glaucoma: Secondary | ICD-10-CM | POA: Diagnosis not present

## 2019-10-19 NOTE — Telephone Encounter (Signed)
Noted thanks °

## 2019-10-20 ENCOUNTER — Telehealth: Payer: Self-pay | Admitting: Family Medicine

## 2019-10-20 NOTE — Telephone Encounter (Cosign Needed)
Transition Care Management Follow-up Telephone Call  Date of discharge and from where: 10/18/19 from [facility]  How have you been since you were released from the hospital? Pt stated, Not very good unable to make appt at this time still weak.  Any questions or concerns? No  Items Reviewed:  Did the pt receive and understand the discharge instructions provided? Yes   Medications obtained and verified? No  not able to do that at this time   Any new allergies since your discharge? No   Dietary orders reviewed? Yes  Do you have support at home? Yes   Functional Questionnaire: (I = Independent and D = Dependent) ADLs:  I and with the assistance of [facility] PCA and daughter as needed  Bathing/Dressing- I  Meal Prep- I  Eating- I  Maintaining continence- I  Transferring/Ambulation- I  Managing Meds- I  Follow up appointments reviewed:   PCP Hospital f/u appt confirmed? No  pt stated daughter will call  For follow up when she is able to make an appt  [facility] f/u appt confirmed? No    Are transportation arrangements needed? No   If their condition worsens, is the pt aware to call PCP or go to the Emergency Dept.? Yes  Was the patient provided with contact information for the PCP's office or ED? Yes  Was to pt encouraged to call back with questions or concerns? Yes

## 2019-10-20 NOTE — Telephone Encounter (Signed)
Yes thanks-okay for orders.  She still needs a virtual visit scheduled so I can sign for the home health orders though with a face-to-face encounter

## 2019-10-20 NOTE — Telephone Encounter (Signed)
Passed away 3 weeks after hospitalization to be seen due to quarantine.  Please offer virtual visit within the 2 weeks post discharge

## 2019-10-20 NOTE — Telephone Encounter (Signed)
[name] from [facility] is calling to request verbal orders for pt. Requesting orders for skilled nursing 1x1 week, 2x3 week, 1x5 week. Also asking if Dr. Yong Channel wants nurse to draw labs [place] since pt was not able to come into office to have labs drawn. Please advise.

## 2019-10-20 NOTE — Telephone Encounter (Signed)
Please advise 

## 2019-10-20 NOTE — Telephone Encounter (Signed)
LVM with VO to [facility] Called Pt  Left message with patient home health care to schedule virtual appointment. Nurse stated will communicate to her family

## 2019-10-20 NOTE — Telephone Encounter (Signed)
Sorry meant to send this to [facility], not [facility]

## 2019-10-23 ENCOUNTER — Telehealth: Payer: Self-pay

## 2019-10-23 DIAGNOSIS — N179 Acute kidney failure, unspecified: Secondary | ICD-10-CM | POA: Diagnosis not present

## 2019-10-23 DIAGNOSIS — U071 COVID-19: Secondary | ICD-10-CM | POA: Diagnosis not present

## 2019-10-23 DIAGNOSIS — L89152 Pressure ulcer of sacral region, stage 2: Secondary | ICD-10-CM | POA: Diagnosis not present

## 2019-10-23 DIAGNOSIS — J1282 Pneumonia due to coronavirus disease 2019: Secondary | ICD-10-CM | POA: Diagnosis not present

## 2019-10-23 DIAGNOSIS — I129 Hypertensive chronic kidney disease with stage 1 through stage 4 chronic kidney disease, or unspecified chronic kidney disease: Secondary | ICD-10-CM | POA: Diagnosis not present

## 2019-10-23 DIAGNOSIS — J9601 Acute respiratory failure with hypoxia: Secondary | ICD-10-CM | POA: Diagnosis not present

## 2019-10-23 NOTE — Telephone Encounter (Signed)
Ok to give order? 

## 2019-10-23 NOTE — Telephone Encounter (Signed)
Yes thanks but needs TCM visit ideally so I can sign for this-did previously declined this-could be a video visit

## 2019-10-23 NOTE — Telephone Encounter (Signed)
Verbal orders needed for Physical Therapy 2x a week for 8 weeks for Status post covid pneumonia. Ok to leave voicemail

## 2019-10-24 ENCOUNTER — Telehealth: Payer: Self-pay | Admitting: Family Medicine

## 2019-10-24 DIAGNOSIS — L89152 Pressure ulcer of sacral region, stage 2: Secondary | ICD-10-CM | POA: Diagnosis not present

## 2019-10-24 DIAGNOSIS — N179 Acute kidney failure, unspecified: Secondary | ICD-10-CM | POA: Diagnosis not present

## 2019-10-24 DIAGNOSIS — I129 Hypertensive chronic kidney disease with stage 1 through stage 4 chronic kidney disease, or unspecified chronic kidney disease: Secondary | ICD-10-CM | POA: Diagnosis not present

## 2019-10-24 DIAGNOSIS — J9601 Acute respiratory failure with hypoxia: Secondary | ICD-10-CM | POA: Diagnosis not present

## 2019-10-24 DIAGNOSIS — U071 COVID-19: Secondary | ICD-10-CM | POA: Diagnosis not present

## 2019-10-24 DIAGNOSIS — J1282 Pneumonia due to coronavirus disease 2019: Secondary | ICD-10-CM | POA: Diagnosis not present

## 2019-10-24 NOTE — Telephone Encounter (Signed)
OK for verbal orders?

## 2019-10-24 NOTE — Telephone Encounter (Signed)
lvm for patient to schedule.

## 2019-10-24 NOTE — Telephone Encounter (Signed)
Called and spoke with caregiver and she said the daughter schedules the appointments. She states, currently they are not scheduling any appointments for the Pt

## 2019-10-24 NOTE — Telephone Encounter (Signed)
Can you call and let daughter know we will need virtual visit to do orders.

## 2019-10-24 NOTE — Telephone Encounter (Signed)
Please advise 

## 2019-10-24 NOTE — Telephone Encounter (Signed)
[facility] to get verbal orders to treat patient - 2 times a week for 6 weeks.

## 2019-10-24 NOTE — Telephone Encounter (Signed)
See prior notes- I cannot provide verbal orders until patient has a virtual visit with me- medicare requirement for face to face visit - virtual is ok

## 2019-10-24 NOTE — Telephone Encounter (Signed)
.  Unfortunately I cannot find any orders unless they schedule a virtual visit is a Medicare requirement that we have a face-to-face visit. I would be perfectly fine with signing for the orders if not required by Medicare that we have a face-to-face visit within 30 days but we have to schedule this virtual visit if they would like to have the services requested

## 2019-10-24 NOTE — Telephone Encounter (Signed)
Please schedule pt virtual visit so that Dr. Yong Channel can sign off on these orders.

## 2019-10-25 NOTE — Telephone Encounter (Signed)
Please schedule virtual for pt. 

## 2019-10-25 NOTE — Telephone Encounter (Signed)
Called the patients daughter to get the patient scheduled. Patients daughter stated that she did not need appt that she got the orders from the hospital and she didn't need anything from the office.

## 2019-10-26 ENCOUNTER — Telehealth: Payer: Self-pay

## 2019-10-26 DIAGNOSIS — I129 Hypertensive chronic kidney disease with stage 1 through stage 4 chronic kidney disease, or unspecified chronic kidney disease: Secondary | ICD-10-CM | POA: Diagnosis not present

## 2019-10-26 DIAGNOSIS — J1282 Pneumonia due to coronavirus disease 2019: Secondary | ICD-10-CM | POA: Diagnosis not present

## 2019-10-26 DIAGNOSIS — L89152 Pressure ulcer of sacral region, stage 2: Secondary | ICD-10-CM | POA: Diagnosis not present

## 2019-10-26 DIAGNOSIS — J9601 Acute respiratory failure with hypoxia: Secondary | ICD-10-CM | POA: Diagnosis not present

## 2019-10-26 DIAGNOSIS — U071 COVID-19: Secondary | ICD-10-CM | POA: Diagnosis not present

## 2019-10-26 DIAGNOSIS — N179 Acute kidney failure, unspecified: Secondary | ICD-10-CM | POA: Diagnosis not present

## 2019-10-26 NOTE — Telephone Encounter (Signed)
Pt.'s daughter called and is wanting to set up a virtual appt to get the okay for these orders. Is there a time we could work her in?

## 2019-10-26 NOTE — Telephone Encounter (Signed)
[facility] Nurse is wanting orders to check pt for a UTI next week when she is out there to get labs. Pt is experiencing burning

## 2019-10-26 NOTE — Telephone Encounter (Signed)
Put in any open slot

## 2019-10-27 DIAGNOSIS — J1282 Pneumonia due to coronavirus disease 2019: Secondary | ICD-10-CM | POA: Diagnosis not present

## 2019-10-27 DIAGNOSIS — J9601 Acute respiratory failure with hypoxia: Secondary | ICD-10-CM | POA: Diagnosis not present

## 2019-10-27 DIAGNOSIS — I129 Hypertensive chronic kidney disease with stage 1 through stage 4 chronic kidney disease, or unspecified chronic kidney disease: Secondary | ICD-10-CM | POA: Diagnosis not present

## 2019-10-27 DIAGNOSIS — L89152 Pressure ulcer of sacral region, stage 2: Secondary | ICD-10-CM | POA: Diagnosis not present

## 2019-10-27 DIAGNOSIS — N179 Acute kidney failure, unspecified: Secondary | ICD-10-CM | POA: Diagnosis not present

## 2019-10-27 DIAGNOSIS — U071 COVID-19: Secondary | ICD-10-CM | POA: Diagnosis not present

## 2019-10-27 NOTE — Telephone Encounter (Signed)
Appt made

## 2019-10-31 ENCOUNTER — Other Ambulatory Visit: Payer: Self-pay | Admitting: Physician Assistant

## 2019-10-31 ENCOUNTER — Inpatient Hospital Stay: Payer: Medicare Other | Attending: Physician Assistant | Admitting: Physician Assistant

## 2019-10-31 ENCOUNTER — Inpatient Hospital Stay: Payer: Medicare Other

## 2019-10-31 DIAGNOSIS — J1282 Pneumonia due to coronavirus disease 2019: Secondary | ICD-10-CM | POA: Diagnosis not present

## 2019-10-31 DIAGNOSIS — D649 Anemia, unspecified: Secondary | ICD-10-CM

## 2019-10-31 DIAGNOSIS — I129 Hypertensive chronic kidney disease with stage 1 through stage 4 chronic kidney disease, or unspecified chronic kidney disease: Secondary | ICD-10-CM | POA: Diagnosis not present

## 2019-10-31 DIAGNOSIS — J9601 Acute respiratory failure with hypoxia: Secondary | ICD-10-CM | POA: Diagnosis not present

## 2019-10-31 DIAGNOSIS — N179 Acute kidney failure, unspecified: Secondary | ICD-10-CM | POA: Diagnosis not present

## 2019-10-31 DIAGNOSIS — N390 Urinary tract infection, site not specified: Secondary | ICD-10-CM | POA: Diagnosis not present

## 2019-10-31 DIAGNOSIS — L89152 Pressure ulcer of sacral region, stage 2: Secondary | ICD-10-CM | POA: Diagnosis not present

## 2019-10-31 DIAGNOSIS — U071 COVID-19: Secondary | ICD-10-CM | POA: Diagnosis not present

## 2019-10-31 LAB — BASIC METABOLIC PANEL
BUN: 28 — AB (ref 4–21)
CO2: 23 — AB (ref 13–22)
Chloride: 102 (ref 99–108)
Creatinine: 1.5 — AB (ref 0.5–1.1)
Glucose: 90
Potassium: 4.6 (ref 3.4–5.3)
Sodium: 136 — AB (ref 137–147)

## 2019-10-31 LAB — CBC AND DIFFERENTIAL
HCT: 23 — AB (ref 36–46)
Hemoglobin: 7.2 — AB (ref 12.0–16.0)
Neutrophils Absolute: 2
Platelets: 130 — AB (ref 150–399)
WBC: 3.7

## 2019-10-31 LAB — FECAL OCCULT BLOOD, GUAIAC: Fecal Occult Blood: NEGATIVE

## 2019-10-31 LAB — HEPATIC FUNCTION PANEL
ALT: 11 (ref 7–35)
AST: 14 (ref 13–35)
Alkaline Phosphatase: 76 (ref 25–125)

## 2019-10-31 LAB — COMPREHENSIVE METABOLIC PANEL
Albumin: 3.4 — AB (ref 3.5–5.0)
Calcium: 8.7 (ref 8.7–10.7)
GFR calc Af Amer: 34
GFR calc non Af Amer: 30

## 2019-10-31 LAB — CBC: RBC: 2.61 — AB (ref 3.87–5.11)

## 2019-10-31 NOTE — Telephone Encounter (Signed)
Returned Jessica's call and provided verbal order.

## 2019-10-31 NOTE — Telephone Encounter (Signed)
[name] is calling in from [facility], they are following up on this message. They are going out to see the patient today around 2, wanting verbal orders to get a urine sample

## 2019-11-01 ENCOUNTER — Ambulatory Visit: Payer: Medicare Other | Admitting: Family Medicine

## 2019-11-01 ENCOUNTER — Telehealth: Payer: Self-pay

## 2019-11-01 DIAGNOSIS — I129 Hypertensive chronic kidney disease with stage 1 through stage 4 chronic kidney disease, or unspecified chronic kidney disease: Secondary | ICD-10-CM | POA: Diagnosis not present

## 2019-11-01 DIAGNOSIS — N179 Acute kidney failure, unspecified: Secondary | ICD-10-CM | POA: Diagnosis not present

## 2019-11-01 DIAGNOSIS — J9601 Acute respiratory failure with hypoxia: Secondary | ICD-10-CM | POA: Diagnosis not present

## 2019-11-01 DIAGNOSIS — L89152 Pressure ulcer of sacral region, stage 2: Secondary | ICD-10-CM | POA: Diagnosis not present

## 2019-11-01 DIAGNOSIS — J1282 Pneumonia due to coronavirus disease 2019: Secondary | ICD-10-CM | POA: Diagnosis not present

## 2019-11-01 DIAGNOSIS — U071 COVID-19: Secondary | ICD-10-CM | POA: Diagnosis not present

## 2019-11-01 NOTE — Progress Notes (Signed)
Phone [phone] Virtual visit via Video note   Subjective:  Chief complaint: Chief Complaint  Patient presents with  . Home Health PT   This visit type was conducted due to national recommendations for restrictions regarding the COVID-19 Pandemic (e.g. social distancing).  This format is felt to be most appropriate for this patient at this time balancing risks to patient and risks to population by having him in for in person visit.  No physical exam was performed (except for noted visual exam or audio findings with Telehealth visits).    Our team/I connected with [name] at  2:40 PM EDT by a video enabled telemedicine application (doxy.me or caregility through epic) and verified that I am speaking with the correct person using two identifiers.  Location patient: [facility] Location provider: [facility], office Persons participating in the virtual visit:  patient, daughter  Our team/I discussed the limitations of evaluation and management by telemedicine and the availability of in person appointments. In light of current covid-19 pandemic, patient also understands that we are trying to protect them by minimizing in office contact if at all possible.  The patient expressed consent for telemedicine visit and agreed to proceed. Patient understands insurance will be billed.   Past Medical History-  Patient Active Problem List   Diagnosis Date Noted  . COVID-19 10/12/2019    Priority: High  . Chronic cough 08/12/2015    Priority: High  . PAD (peripheral artery disease) ([place]) 05/10/2013    Priority: High  . PAF (paroxysmal atrial fibrillation) (HCC)     Priority: High  . History of stroke     Priority: High  . CKD (chronic kidney disease), stage III 06/15/2018    Priority: Medium  . Hyperglycemia 06/25/2014    Priority: Medium  . Hypothyroidism 06/07/2014    Priority: Medium  . Hyperlipidemia 06/07/2014    Priority: Medium  . Hypertension     Priority: Medium  . GERD  (gastroesophageal reflux disease) 06/25/2014    Priority: Low  . Vitamin D deficiency 06/25/2014    Priority: Low  . Intracervical pessary 06/07/2014    Priority: Low  . Sinus bradycardia 05/19/2013    Priority: Low  . PAC (premature atrial contraction) 05/20/2011    Priority: Low  . Low vitamin B12 level 12/09/2017  . Anemia 08/16/2017  . Perennial allergic rhinitis 08/12/2015    Medications- reviewed and updated Current Outpatient Medications  Medication Sig Dispense Refill  . apixaban (ELIQUIS) 2.5 MG TABS tablet Take 1 tablet (2.5 mg total) by mouth 2 (two) times daily. 60 tablet 6  . Ascorbic Acid (VITAMIN C) 500 MG/5ML LIQD Take 1,000 mg by mouth daily.     . Digestive Enzyme CAPS Take 1 capsule by mouth See admin instructions. Mix the contents of 1 capsule into water and drink with all meals    . diltiazem (CARDIZEM CD) 120 MG 24 hr capsule Take 1 capsule (120 mg total) by mouth daily. 90 capsule 3  . dorzolamide-timolol (COSOPT) 22.3-6.8 MG/ML ophthalmic solution Place 1 drop into the left eye in the morning and at bedtime.     . Multiple Vitamin (MULTIVITAMIN) LIQD Take 30 mLs by mouth daily.    . Nutritional Supplements (FEEDING SUPPLEMENT, KATE FARMS STANDARD 1.4,) LIQD liquid Take 325 mLs by mouth 3 (three) times daily between meals.    . pantoprazole (PROTONIX) 40 MG tablet Take 1 tablet (40 mg total) by mouth 2 (two) times daily. 30 tablet 0  . Papaya TABS  Take 3 tablets by mouth 3 (three) times daily after meals.    . Probiotic CAPS Take 1 capsule by mouth daily.    . rosuvastatin (CRESTOR) 5 MG tablet Take 1 tablet (5 mg total) by mouth once a week. (Patient taking differently: Take 5 mg by mouth every Saturday. ) 13 tablet 3  . thyroid (ARMOUR THYROID) 15 MG tablet Take 1 tablet (15 mg total) by mouth daily. 90 tablet 1   No current facility-administered medications for this visit.     Objective:  BP 130/60   Pulse (!) 58  self reported vitals Gen: NAD, resting  comfortably, daughter helping patient today Lungs: nonlabored, normal respiratory rate  Skin: appears dry, no obvious rash     Assessment and Plan   #Generalized weakness/anemia after COVID-19 hospitalization S: Patient was hospitalized from October 11, 2019 through October 18, 2019-unfortunately patient's husband died from COVID-19-fortunately patient survived. Patient's chest x-ray showed bilateral lower lobe airspace disease concerning for atypical viral pneumonia-COVID-19 test was positive. Patient did well with steroids and remdesivir. She also required some as needed Lasix due to edema. Thankfully her oxygen requirement resolved before discharge  Patient reports daily to medication she is taking her Eliquis and her eyedrops-not taking medication she was instructed to stop but also not taking medication she was to continue including diltiazem, rosuvastatin, Armour Thyroid.  Patient continues to feel weak but has some improvement.  Still has a fair amount of swelling on legs Patient mentioned that her physical therapy is going well. And she would like to continue to work with them.    Of note patient had her baseline hemoglobin dropped from around 10-7.8 before discharge.  Previously in November 2020 patient had a mild pancytopenia but declined hematology referral at that time-thankfully platelets improved as did her white count but anemia has persisted.  She denies blood in the stool, melena.  Some of her issues could be dilutional as sounds fluid overloaded with edema  Patient had outpatient follow-up with hematology planned but missed this due to quarantine for COVID-19 A/P: Weakness after COVID-19 could be related to prolonged hospitalization as well COVID-19 itself-continue physical therapy at home.  Symptoms also could be related to anemia.  Recommended home stool cards-we will mail these to patient.  Recommended follow-up with hematology-patient's family agrees to call and schedule this.   I also like to get a repeat CBC in a week-we will request this for home health. Message to [name] team "Team please have home health complete CBC under anemia unspecified this week (sometime between September 13 and 15 hopefully and fax to [place] immediately. I want to keep a close watch on her hgb". Maryelizabeth Kaufmann, CMA was to mail stool cards to patient today.   Anemia could be a hematological problem but also could be from blood loss-discussed potential benefit of stopping Eliquis but also potential risk of increased stroke-patient declines stopping Eliquis at this time worried about stroke risk but we discussed may have to reengage this topic if hemoglobin drops further  Bladder Infection S: Patient reports several days of burning with peeing. She does have a pessary in place.  A/P: I have a copy of urinalysis from home health checks-concerning for UTI.  Urine culture pending on their report.  We will start Keflex 500 mg every 8 hours.  Patient reports sensitivity to medications and we wrote the prescription as 250 mg pills-she can take 2 if possible but at least 1 every 8 hours  #hypertension S:  medication:  off all meds (chlorthalidone 25 mg, hydralazine 25 mg, irbesartan 300 mg, diltiazem). Wants to know if can take chlorthalidone due to edema in her legs.  Home readings #s: 130s- 140s BP Readings from Last 3 Encounters:  11/03/19 130/60  10/18/19 (!) 102/59  09/26/19 (!) 144/68  A/P: Blood pressure reasonably controlled at home in 130s to 140s range without any medication.  I think it is reasonable for her to try 1/2 dose of chlorthalidone 25 mg.  And we will keep watch on blood pressure at home - anemia could contribute to edema -Hold off on restarting diltiazem  #hypothyroidism S: compliant On thyroid medication- did not restart armour thyroid 15 mcg after leaving hospital  Lab Results  Component Value Date   TSH 2.713 10/16/2019   A/P: Suspect poor control-instructed patient to restart  Armour Thyroid 15 mg.  Hypothyroidism can cause edema so hoping this may help  # Atrial fibrillation S: Rate controlled with no medication at this time Anticoagulated with Eliquis 2.5 mg twice daily  Patient is  followed by cardiology: Dr. Julianne Handler  A/P: As above consider stopping Eliquis the patient would like to continue to increase stroke risk.  We may have to stop this depending on how her hemoglobin does.  She is no longer on Plavix for peripheral arterial disease per cardiology after starting Eliquis  #hyperlipidemia S: Medication: Rosuvastatin 5 mg once weekly prescribed-patient not taking Lab Results  Component Value Date   CHOL 199 07/28/2018   HDL 53.10 07/28/2018   LDLCALC 124 (H) 07/28/2018   TRIG 180 (H) 10/11/2019   CHOLHDL 4 07/28/2018   A/P: With history of stroke ideally patient will take this-we need to recheck a lipid panel next in office visit I want her to be on this for several weeks first.  Patient agrees to restart rosuvastatin    #Edema-recommended elevating legs.  A very low-grade compression stocking potentially could help but have to be cautious with peripheral arterial disease-she would need to stop immediately if any pain in the legs or signs of decreased blood flow  #GERD-pantoprazole-patient is not taking this-no reflux reported.  #PAD-patient with limited mobility but no reported leg pain at present.  On Eliquis instead of Plavix.  Plan to restart statin as above-may need intensified dose but patient is hesitant about statins due to prior statin intolerance  There are some CKD stage III-patient with significant worsening creatinine while hospitalized up to nearly 3-thankfully this improved on most recent labs back down at 1.5 range  recommended follow up: We did not schedule plan follow-up  Lab/Order associations:   ICD-10-CM   1. COVID-19  U07.1   2. Anemia, unspecified type  D64.9 Fecal occult blood, imunochemical  3. PAF (paroxysmal atrial  fibrillation) (HCC)  I48.0   4. PAD (peripheral artery disease) (HCC)  I73.9   5. History of stroke  Z86.73   6. Hypothyroidism, unspecified type  E03.9   7. Essential hypertension  I10   8. Stage 3 chronic kidney disease, unspecified whether stage 3a or 3b CKD  N18.30     Meds ordered this encounter  Medications  . DISCONTD: cephALEXin (KEFLEX) 500 MG capsule    Sig: Take 1 capsule (500 mg total) by mouth every 8 (eight) hours for 7 days.    Dispense:  21 capsule    Refill:  0  . cephALEXin (KEFLEX) 250 MG capsule    Sig: Take 1-2 capsules (250-500 mg total) by mouth every 8 (eight) hours.  Take 2 tablets if tolerate every 8 hours. 1 tablet if do not tolerate but still every 8 hours    Dispense:  42 capsule    Refill:  0    Time Spent: 49 minutes of total time (3:20 PM- 3:45 PM, 7:36 PM-8:00 PM) was spent on the date of the encounter performing the following actions: chart review prior to seeing the patient, obtaining history, performing a medically necessary exam, counseling on the treatment plan, placing orders, and documenting in our EHR.   Return precautions advised.  Garret Reddish, MD

## 2019-11-01 NOTE — Telephone Encounter (Signed)
Hemoglobin dropped from 10s range to 7s before hospital discharge. In august referred to oncology for anemia and leukopenia- looks like oncology ordered most recent labs? What is status of her appointment with oncology?   Great advice given to patient. Thankful hemoglobin at least stable in 7s bu tim not sure why its that low- we need those stool cards- could be GI bleed and if there is blood in stool im going to need to likely start eliquis but with stroke history there is certainly some risk there.

## 2019-11-01 NOTE — Telephone Encounter (Signed)
[facility] call from [facility] concerned about hemoglobin. She will call family and have pick up stool cards .  Informed nurse that lab is stable from check two weeks ago. She does have follow up on Friday. Will advise that if she is having any chest pain, SOB, dizziness to go to ED

## 2019-11-01 NOTE — Patient Instructions (Signed)
Health Maintenance Due  Topic Date Due  . COVID-19 Vaccine (1) Never done  . INFLUENZA VACCINE  Never done   Depression screen [facility] 2/9 03/30/2019 02/21/2019 12/27/2018  Decreased Interest 0 0 0  Down, Depressed, Hopeless 0 0 0  PHQ - 2 Score 0 0 0

## 2019-11-02 ENCOUNTER — Other Ambulatory Visit: Payer: Self-pay

## 2019-11-02 DIAGNOSIS — N179 Acute kidney failure, unspecified: Secondary | ICD-10-CM | POA: Diagnosis not present

## 2019-11-02 DIAGNOSIS — J1282 Pneumonia due to coronavirus disease 2019: Secondary | ICD-10-CM | POA: Diagnosis not present

## 2019-11-02 DIAGNOSIS — L89152 Pressure ulcer of sacral region, stage 2: Secondary | ICD-10-CM | POA: Diagnosis not present

## 2019-11-02 DIAGNOSIS — U071 COVID-19: Secondary | ICD-10-CM | POA: Diagnosis not present

## 2019-11-02 DIAGNOSIS — J9601 Acute respiratory failure with hypoxia: Secondary | ICD-10-CM | POA: Diagnosis not present

## 2019-11-02 DIAGNOSIS — I129 Hypertensive chronic kidney disease with stage 1 through stage 4 chronic kidney disease, or unspecified chronic kidney disease: Secondary | ICD-10-CM | POA: Diagnosis not present

## 2019-11-02 MED ORDER — THYROID 15 MG PO TABS
15.0000 mg | ORAL_TABLET | Freq: Every day | ORAL | 1 refills | Status: DC
Start: 2019-11-02 — End: 2020-07-29

## 2019-11-03 ENCOUNTER — Telehealth (INDEPENDENT_AMBULATORY_CARE_PROVIDER_SITE_OTHER): Payer: Medicare Other | Admitting: Family Medicine

## 2019-11-03 ENCOUNTER — Encounter: Payer: Self-pay | Admitting: Family Medicine

## 2019-11-03 VITALS — BP 130/60 | HR 58

## 2019-11-03 DIAGNOSIS — D649 Anemia, unspecified: Secondary | ICD-10-CM

## 2019-11-03 DIAGNOSIS — I480 Paroxysmal atrial fibrillation: Secondary | ICD-10-CM | POA: Diagnosis not present

## 2019-11-03 DIAGNOSIS — L89152 Pressure ulcer of sacral region, stage 2: Secondary | ICD-10-CM | POA: Diagnosis not present

## 2019-11-03 DIAGNOSIS — E039 Hypothyroidism, unspecified: Secondary | ICD-10-CM

## 2019-11-03 DIAGNOSIS — I10 Essential (primary) hypertension: Secondary | ICD-10-CM | POA: Diagnosis not present

## 2019-11-03 DIAGNOSIS — Z8673 Personal history of transient ischemic attack (TIA), and cerebral infarction without residual deficits: Secondary | ICD-10-CM

## 2019-11-03 DIAGNOSIS — I129 Hypertensive chronic kidney disease with stage 1 through stage 4 chronic kidney disease, or unspecified chronic kidney disease: Secondary | ICD-10-CM | POA: Diagnosis not present

## 2019-11-03 DIAGNOSIS — J1282 Pneumonia due to coronavirus disease 2019: Secondary | ICD-10-CM | POA: Diagnosis not present

## 2019-11-03 DIAGNOSIS — U071 COVID-19: Secondary | ICD-10-CM | POA: Diagnosis not present

## 2019-11-03 DIAGNOSIS — N183 Chronic kidney disease, stage 3 unspecified: Secondary | ICD-10-CM

## 2019-11-03 DIAGNOSIS — N179 Acute kidney failure, unspecified: Secondary | ICD-10-CM | POA: Diagnosis not present

## 2019-11-03 DIAGNOSIS — J9601 Acute respiratory failure with hypoxia: Secondary | ICD-10-CM | POA: Diagnosis not present

## 2019-11-03 DIAGNOSIS — I739 Peripheral vascular disease, unspecified: Secondary | ICD-10-CM

## 2019-11-03 MED ORDER — CEPHALEXIN 500 MG PO CAPS: 500.0000 mg | ORAL_CAPSULE | Freq: Three times a day (TID) | ORAL | 0 refills | Status: DC

## 2019-11-03 MED ORDER — CEPHALEXIN 250 MG PO CAPS: 250.0000 mg | ORAL_CAPSULE | Freq: Three times a day (TID) | ORAL | 0 refills | Status: DC

## 2019-11-03 NOTE — Assessment & Plan Note (Signed)
S: medication:  off all meds (chlorthalidone 25 mg, hydralazine 25 mg, irbesartan 300 mg, diltiazem). Wants to know if can take chlorthalidone due to edema in her legs.  Home readings #s: 130s- 140s BP Readings from Last 3 Encounters:  11/03/19 130/60  10/18/19 (!) 102/59  09/26/19 (!) 144/68  A/P: Blood pressure reasonably controlled at home in 130s to 140s range without any medication.  I think it is reasonable for her to try 1/2 dose of chlorthalidone 25 mg.  And we will keep watch on blood pressure at home - anemia could contribute to edema -Hold off on restarting diltiazem

## 2019-11-06 ENCOUNTER — Telehealth: Payer: Self-pay

## 2019-11-06 NOTE — Telephone Encounter (Signed)
Patient had a virtual visit with Dr. Yong Channel on 11/04/2019.

## 2019-11-06 NOTE — Telephone Encounter (Signed)
   Request for verbal orders       2x for 8 weeks PT ?     2x for 6 weeks OT   Leave voicemail [phone]

## 2019-11-06 NOTE — Telephone Encounter (Signed)
err

## 2019-11-07 DIAGNOSIS — L89152 Pressure ulcer of sacral region, stage 2: Secondary | ICD-10-CM | POA: Diagnosis not present

## 2019-11-07 DIAGNOSIS — J9601 Acute respiratory failure with hypoxia: Secondary | ICD-10-CM | POA: Diagnosis not present

## 2019-11-07 DIAGNOSIS — J1282 Pneumonia due to coronavirus disease 2019: Secondary | ICD-10-CM | POA: Diagnosis not present

## 2019-11-07 DIAGNOSIS — U071 COVID-19: Secondary | ICD-10-CM | POA: Diagnosis not present

## 2019-11-07 DIAGNOSIS — I129 Hypertensive chronic kidney disease with stage 1 through stage 4 chronic kidney disease, or unspecified chronic kidney disease: Secondary | ICD-10-CM | POA: Diagnosis not present

## 2019-11-07 DIAGNOSIS — N179 Acute kidney failure, unspecified: Secondary | ICD-10-CM | POA: Diagnosis not present

## 2019-11-07 DIAGNOSIS — D649 Anemia, unspecified: Secondary | ICD-10-CM | POA: Diagnosis not present

## 2019-11-07 NOTE — Telephone Encounter (Signed)
Called and spoke with [facility] and provided verbal orders.

## 2019-11-09 DIAGNOSIS — N179 Acute kidney failure, unspecified: Secondary | ICD-10-CM | POA: Diagnosis not present

## 2019-11-09 DIAGNOSIS — U071 COVID-19: Secondary | ICD-10-CM | POA: Diagnosis not present

## 2019-11-09 DIAGNOSIS — L89152 Pressure ulcer of sacral region, stage 2: Secondary | ICD-10-CM | POA: Diagnosis not present

## 2019-11-09 DIAGNOSIS — J9601 Acute respiratory failure with hypoxia: Secondary | ICD-10-CM | POA: Diagnosis not present

## 2019-11-09 DIAGNOSIS — J1282 Pneumonia due to coronavirus disease 2019: Secondary | ICD-10-CM | POA: Diagnosis not present

## 2019-11-09 DIAGNOSIS — I129 Hypertensive chronic kidney disease with stage 1 through stage 4 chronic kidney disease, or unspecified chronic kidney disease: Secondary | ICD-10-CM | POA: Diagnosis not present

## 2019-11-10 ENCOUNTER — Telehealth: Payer: Self-pay | Admitting: Cardiovascular Disease

## 2019-11-10 ENCOUNTER — Telehealth: Payer: Self-pay | Admitting: Family Medicine

## 2019-11-10 DIAGNOSIS — U071 COVID-19: Secondary | ICD-10-CM | POA: Diagnosis not present

## 2019-11-10 DIAGNOSIS — J9601 Acute respiratory failure with hypoxia: Secondary | ICD-10-CM | POA: Diagnosis not present

## 2019-11-10 DIAGNOSIS — N179 Acute kidney failure, unspecified: Secondary | ICD-10-CM | POA: Diagnosis not present

## 2019-11-10 DIAGNOSIS — L89152 Pressure ulcer of sacral region, stage 2: Secondary | ICD-10-CM | POA: Diagnosis not present

## 2019-11-10 DIAGNOSIS — J1282 Pneumonia due to coronavirus disease 2019: Secondary | ICD-10-CM | POA: Diagnosis not present

## 2019-11-10 DIAGNOSIS — I129 Hypertensive chronic kidney disease with stage 1 through stage 4 chronic kidney disease, or unspecified chronic kidney disease: Secondary | ICD-10-CM | POA: Diagnosis not present

## 2019-11-10 NOTE — Telephone Encounter (Signed)
Spoke to pt who report around 2 am, she developed constant pain in her right leg rating it 7/10. She report symptoms started in her hip and radiated down to her feet. She denies swelling, redness, or warmth. Pt report since he has gotten out of bed and moving around, pain has subsided. Pt voiced she is still concerned and wanted to make MD aware.    Message routed

## 2019-11-10 NOTE — Telephone Encounter (Signed)
Nurse from [place] called stating a stool specimen kit was supposed to be mailed to pt. Pt has not yet received this. Please advise.

## 2019-11-10 NOTE — Telephone Encounter (Signed)
Patient's daughter states the patient has been experiencing severe leg pain in her right leg since 2:30 AM today. Patient's daughter assumes pain may be due to stent in right leg. Please return call to discuss.

## 2019-11-13 DIAGNOSIS — J9601 Acute respiratory failure with hypoxia: Secondary | ICD-10-CM | POA: Diagnosis not present

## 2019-11-13 DIAGNOSIS — J1282 Pneumonia due to coronavirus disease 2019: Secondary | ICD-10-CM | POA: Diagnosis not present

## 2019-11-13 DIAGNOSIS — N179 Acute kidney failure, unspecified: Secondary | ICD-10-CM | POA: Diagnosis not present

## 2019-11-13 DIAGNOSIS — L89152 Pressure ulcer of sacral region, stage 2: Secondary | ICD-10-CM | POA: Diagnosis not present

## 2019-11-13 DIAGNOSIS — U071 COVID-19: Secondary | ICD-10-CM | POA: Diagnosis not present

## 2019-11-13 DIAGNOSIS — I129 Hypertensive chronic kidney disease with stage 1 through stage 4 chronic kidney disease, or unspecified chronic kidney disease: Secondary | ICD-10-CM | POA: Diagnosis not present

## 2019-11-13 NOTE — Telephone Encounter (Signed)
Another stool kit has been mailed out to the patient.

## 2019-11-13 NOTE — Telephone Encounter (Signed)
Pt updated and verbalized understanding. Message sent to scheduling to move up ultrasound date and follow up with Dr.Berry.

## 2019-11-13 NOTE — Telephone Encounter (Signed)
Ms. Vine needs lower extremity arterial Doppler studies then return office with me to review and evaluate

## 2019-11-14 ENCOUNTER — Telehealth: Payer: Self-pay | Admitting: Physician Assistant

## 2019-11-14 DIAGNOSIS — N179 Acute kidney failure, unspecified: Secondary | ICD-10-CM | POA: Diagnosis not present

## 2019-11-14 DIAGNOSIS — J9601 Acute respiratory failure with hypoxia: Secondary | ICD-10-CM | POA: Diagnosis not present

## 2019-11-14 DIAGNOSIS — L89152 Pressure ulcer of sacral region, stage 2: Secondary | ICD-10-CM | POA: Diagnosis not present

## 2019-11-14 DIAGNOSIS — U071 COVID-19: Secondary | ICD-10-CM | POA: Diagnosis not present

## 2019-11-14 DIAGNOSIS — J1282 Pneumonia due to coronavirus disease 2019: Secondary | ICD-10-CM | POA: Diagnosis not present

## 2019-11-14 DIAGNOSIS — I129 Hypertensive chronic kidney disease with stage 1 through stage 4 chronic kidney disease, or unspecified chronic kidney disease: Secondary | ICD-10-CM | POA: Diagnosis not present

## 2019-11-14 NOTE — Telephone Encounter (Signed)
Received a new pt referral from Dr. Yong Channel for leukopenia and anemia. Ms. Villers has been scheduled to see Cassie on 10/21 at 1pm w/labs at 1230pm. Appt has been given to the pt's daughter. Aware to arrive 30 minutes early.

## 2019-11-15 DIAGNOSIS — U071 COVID-19: Secondary | ICD-10-CM | POA: Diagnosis not present

## 2019-11-15 DIAGNOSIS — L89152 Pressure ulcer of sacral region, stage 2: Secondary | ICD-10-CM | POA: Diagnosis not present

## 2019-11-15 DIAGNOSIS — J1282 Pneumonia due to coronavirus disease 2019: Secondary | ICD-10-CM | POA: Diagnosis not present

## 2019-11-15 DIAGNOSIS — N179 Acute kidney failure, unspecified: Secondary | ICD-10-CM | POA: Diagnosis not present

## 2019-11-15 DIAGNOSIS — J9601 Acute respiratory failure with hypoxia: Secondary | ICD-10-CM | POA: Diagnosis not present

## 2019-11-15 DIAGNOSIS — I129 Hypertensive chronic kidney disease with stage 1 through stage 4 chronic kidney disease, or unspecified chronic kidney disease: Secondary | ICD-10-CM | POA: Diagnosis not present

## 2019-11-16 ENCOUNTER — Other Ambulatory Visit (HOSPITAL_COMMUNITY): Payer: Self-pay | Admitting: Cardiovascular Disease

## 2019-11-16 ENCOUNTER — Other Ambulatory Visit: Payer: Self-pay

## 2019-11-16 ENCOUNTER — Ambulatory Visit (HOSPITAL_COMMUNITY)
Admission: RE | Admit: 2019-11-16 | Discharge: 2019-11-16 | Disposition: A | Discharge status: Home / Self Care | Payer: Medicare Other | Source: Ambulatory Visit | Attending: Cardiology | Admitting: Cardiology

## 2019-11-16 DIAGNOSIS — I739 Peripheral vascular disease, unspecified: Secondary | ICD-10-CM | POA: Diagnosis not present

## 2019-11-18 DIAGNOSIS — M1990 Unspecified osteoarthritis, unspecified site: Secondary | ICD-10-CM | POA: Diagnosis not present

## 2019-11-18 DIAGNOSIS — G43909 Migraine, unspecified, not intractable, without status migrainosus: Secondary | ICD-10-CM | POA: Diagnosis not present

## 2019-11-18 DIAGNOSIS — J1282 Pneumonia due to coronavirus disease 2019: Secondary | ICD-10-CM | POA: Diagnosis not present

## 2019-11-18 DIAGNOSIS — E7800 Pure hypercholesterolemia, unspecified: Secondary | ICD-10-CM | POA: Diagnosis not present

## 2019-11-18 DIAGNOSIS — U071 COVID-19: Secondary | ICD-10-CM | POA: Diagnosis not present

## 2019-11-18 DIAGNOSIS — E039 Hypothyroidism, unspecified: Secondary | ICD-10-CM | POA: Diagnosis not present

## 2019-11-18 DIAGNOSIS — I739 Peripheral vascular disease, unspecified: Secondary | ICD-10-CM | POA: Diagnosis not present

## 2019-11-18 DIAGNOSIS — I129 Hypertensive chronic kidney disease with stage 1 through stage 4 chronic kidney disease, or unspecified chronic kidney disease: Secondary | ICD-10-CM | POA: Diagnosis not present

## 2019-11-18 DIAGNOSIS — L89152 Pressure ulcer of sacral region, stage 2: Secondary | ICD-10-CM | POA: Diagnosis not present

## 2019-11-18 DIAGNOSIS — I480 Paroxysmal atrial fibrillation: Secondary | ICD-10-CM | POA: Diagnosis not present

## 2019-11-18 DIAGNOSIS — N179 Acute kidney failure, unspecified: Secondary | ICD-10-CM | POA: Diagnosis not present

## 2019-11-18 DIAGNOSIS — N320 Bladder-neck obstruction: Secondary | ICD-10-CM | POA: Diagnosis not present

## 2019-11-18 DIAGNOSIS — Z7901 Long term (current) use of anticoagulants: Secondary | ICD-10-CM | POA: Diagnosis not present

## 2019-11-18 DIAGNOSIS — Z8673 Personal history of transient ischemic attack (TIA), and cerebral infarction without residual deficits: Secondary | ICD-10-CM | POA: Diagnosis not present

## 2019-11-18 DIAGNOSIS — J9601 Acute respiratory failure with hypoxia: Secondary | ICD-10-CM | POA: Diagnosis not present

## 2019-11-18 DIAGNOSIS — H409 Unspecified glaucoma: Secondary | ICD-10-CM | POA: Diagnosis not present

## 2019-11-18 DIAGNOSIS — D1771 Benign lipomatous neoplasm of kidney: Secondary | ICD-10-CM | POA: Diagnosis not present

## 2019-11-18 DIAGNOSIS — N1832 Chronic kidney disease, stage 3b: Secondary | ICD-10-CM | POA: Diagnosis not present

## 2019-11-18 DIAGNOSIS — D649 Anemia, unspecified: Secondary | ICD-10-CM | POA: Diagnosis not present

## 2019-11-20 ENCOUNTER — Telehealth: Payer: Self-pay

## 2019-11-20 ENCOUNTER — Telehealth: Payer: Self-pay | Admitting: Family Medicine

## 2019-11-20 NOTE — Telephone Encounter (Signed)
Pt.s daughter called to get her scheduled for her f/u up this week per [place]. However, Dr. Yong Channel has no openings this week. Please Advise

## 2019-11-20 NOTE — Telephone Encounter (Signed)
Patients daughter in law called states patient is having high blood pressure and would like to know what medication she should start back again, she stated when covid started she stopped taking all BP medication, and has been okay until now and now thinks it would be good to start one of the medications she previously took for her BP CHLOTHALIDONE or HYDRALAZINE. Had patient triaged as well for high BP

## 2019-11-20 NOTE — Telephone Encounter (Signed)
Called and spoke with pt an daughter and provided below information. Instructed them to call the office and schedule f/u for this week, office number provided to pt and daughter.

## 2019-11-20 NOTE — Telephone Encounter (Signed)
Pt has been experiencing high BP readings. She was triaged and told that she needs to be seen within 4 hours. Her daughter-in-law is unable to take her anywhere. Pt. Is wanting to know if she needs to add back her hydralazine to her medications for her blood pressure.  Below are readings over the past day and 1/2  203/95 174/74 191/82 193/81   Call back [phone]

## 2019-11-20 NOTE — Telephone Encounter (Signed)
See other phone note

## 2019-11-20 NOTE — Telephone Encounter (Signed)
Wednesday at 70 40. I have a meeting at 12 30 I have to make it to but we really need to see her.

## 2019-11-20 NOTE — Telephone Encounter (Signed)
Yes thanks start back on hydralazine now and if not improving by tomorrow morning start chlorthalidone as well  Try to find a date for her to come in this week for reevaluation.   Make sure no stroke symptoms- ER if so

## 2019-11-21 DIAGNOSIS — I129 Hypertensive chronic kidney disease with stage 1 through stage 4 chronic kidney disease, or unspecified chronic kidney disease: Secondary | ICD-10-CM | POA: Diagnosis not present

## 2019-11-21 DIAGNOSIS — J9601 Acute respiratory failure with hypoxia: Secondary | ICD-10-CM | POA: Diagnosis not present

## 2019-11-21 DIAGNOSIS — L89152 Pressure ulcer of sacral region, stage 2: Secondary | ICD-10-CM | POA: Diagnosis not present

## 2019-11-21 DIAGNOSIS — J1282 Pneumonia due to coronavirus disease 2019: Secondary | ICD-10-CM | POA: Diagnosis not present

## 2019-11-21 DIAGNOSIS — N179 Acute kidney failure, unspecified: Secondary | ICD-10-CM | POA: Diagnosis not present

## 2019-11-21 DIAGNOSIS — U071 COVID-19: Secondary | ICD-10-CM | POA: Diagnosis not present

## 2019-11-21 NOTE — Telephone Encounter (Signed)
Patient has been scheduled

## 2019-11-22 ENCOUNTER — Encounter: Payer: Self-pay | Admitting: Family Medicine

## 2019-11-22 ENCOUNTER — Ambulatory Visit (INDEPENDENT_AMBULATORY_CARE_PROVIDER_SITE_OTHER): Payer: Medicare Other | Admitting: Family Medicine

## 2019-11-22 VITALS — BP 148/76 | HR 66 | Temp 98.2°F | Resp 18 | Ht 63.0 in | Wt 114.6 lb

## 2019-11-22 DIAGNOSIS — E785 Hyperlipidemia, unspecified: Secondary | ICD-10-CM

## 2019-11-22 DIAGNOSIS — I10 Essential (primary) hypertension: Secondary | ICD-10-CM

## 2019-11-22 MED ORDER — IRBESARTAN 300 MG PO TABS: 300.0000 mg | ORAL_TABLET | Freq: Every day | ORAL | 3 refills | Status: DC

## 2019-11-22 MED ORDER — CHLORTHALIDONE 25 MG PO TABS
25.0000 mg | ORAL_TABLET | Freq: Every day | ORAL | 3 refills | Status: DC
Start: 2019-11-22 — End: 2020-06-06

## 2019-11-22 NOTE — Patient Instructions (Addendum)
Health Maintenance Due  Topic Date Due  . COVID-19 Vaccine (1) Has not gotten her vaccine. Need to wait at least 3 months after getting out of hospital Never done  . INFLUENZA VACCINE Declined in office flu shot.  Never done    Continue hydralazine 3x a day- space by 8 hours if you can Chlorthalidone 25mg  daily in morning Irbesartan 300mg - cut in half and take twice daily (start this)  Update me in 1 week, see me back in 2-3 weeks likely update bloodwork at that time (can use same day slot)   Pick up stool cards from lab today - if they are not there check with front desk and we should be able to get a cma to get for you

## 2019-11-22 NOTE — Progress Notes (Signed)
Phone [phone] In person visit   Subjective:   [name] is a 86 y.o. year old very pleasant female patient who presents for/with See problem oriented charting Chief Complaint  Patient presents with  . Hypertension   This visit occurred during the SARS-CoV-2 public health emergency.  Safety protocols were in place, including screening questions prior to the visit, additional usage of staff PPE, and extensive cleaning of exam room while observing appropriate contact time as indicated for disinfecting solutions.   Past Medical History-  Patient Active Problem List   Diagnosis Date Noted  . COVID-19 10/12/2019    Priority: High  . Chronic cough 08/12/2015    Priority: High  . PAD (peripheral artery disease) ([place]) 05/10/2013    Priority: High  . PAF (paroxysmal atrial fibrillation) (HCC)     Priority: High  . History of stroke     Priority: High  . CKD (chronic kidney disease), stage III 06/15/2018    Priority: Medium  . Hyperglycemia 06/25/2014    Priority: Medium  . Hypothyroidism 06/07/2014    Priority: Medium  . Hyperlipidemia 06/07/2014    Priority: Medium  . Hypertension     Priority: Medium  . GERD (gastroesophageal reflux disease) 06/25/2014    Priority: Low  . Vitamin D deficiency 06/25/2014    Priority: Low  . Intracervical pessary 06/07/2014    Priority: Low  . Sinus bradycardia 05/19/2013    Priority: Low  . PAC (premature atrial contraction) 05/20/2011    Priority: Low  . Low vitamin B12 level 12/09/2017  . Anemia 08/16/2017  . Perennial allergic rhinitis 08/12/2015    Medications- reviewed and updated Current Outpatient Medications  Medication Sig Dispense Refill  . apixaban (ELIQUIS) 2.5 MG TABS tablet Take 1 tablet (2.5 mg total) by mouth 2 (two) times daily. 60 tablet 6  . cephALEXin (KEFLEX) 250 MG capsule Take 1-2 capsules (250-500 mg total) by mouth every 8 (eight) hours. Take 2 tablets if tolerate every 8 hours. 1 tablet if do not  tolerate but still every 8 hours 42 capsule 0  . chlorthalidone (HYGROTON) 25 MG tablet Take 1 tablet (25 mg total) by mouth daily. 90 tablet 3  . Digestive Enzyme CAPS Take 1 capsule by mouth See admin instructions. Mix the contents of 1 capsule into water and drink with all meals    . dorzolamide-timolol (COSOPT) 22.3-6.8 MG/ML ophthalmic solution Place 1 drop into the left eye in the morning and at bedtime.     . hydrALAZINE (APRESOLINE) 25 MG tablet Take 25 mg by mouth 3 (three) times daily.    . Multiple Vitamin (MULTIVITAMIN) LIQD Take 30 mLs by mouth daily.    . Nutritional Supplements (FEEDING SUPPLEMENT, KATE FARMS STANDARD 1.4,) LIQD liquid Take 325 mLs by mouth 3 (three) times daily between meals.    . Papaya TABS Take 3 tablets by mouth 3 (three) times daily after meals.    . Probiotic CAPS Take 1 capsule by mouth daily.    . rosuvastatin (CRESTOR) 5 MG tablet Take 1 tablet (5 mg total) by mouth once a week. (Patient taking differently: Take 5 mg by mouth every Saturday. ) 13 tablet 3  . thyroid (ARMOUR THYROID) 15 MG tablet Take 1 tablet (15 mg total) by mouth daily. 90 tablet 1  . irbesartan (AVAPRO) 300 MG tablet Take 1 tablet (300 mg total) by mouth daily. 90 tablet 3   No current facility-administered medications for this visit.     Objective:  BP (!) 148/76   Pulse 66   Temp 98.2 F (36.8 C) (Temporal)   Resp 18   Ht 5\' 3"  (1.6 m)   Wt 114 lb 9.6 oz (52 kg)   SpO2 98%   BMI 20.30 kg/m  Gen: NAD, resting comfortably CV:  no murmurs rubs or gallops Lungs: CTAB no crackles, wheeze, rhonchi Ext: no edema Skin: warm, dry     Assessment and Plan   #hypertension S: medication: hydralazine 25 mg TID recently restarted. She also restarted chlorthalidone and 2 nights has taken extra 25 mg and then 12.5 mg Home readings #s: Elevated readings started last Thursday. Patient has brought in her home readings. Patient mentioned that she has not had any headaches with her  elevated readings. She did have some shortness of breath earlier today but it has subsided.   bp up to over 200. Hr typically in the 38s. Back on hydralazine and chlorthalidone #s have trended down 150s- 170s BP Readings from Last 3 Encounters:  11/22/19 (!) 148/76 home cuff close to accurate  11/03/19 130/60  10/18/19 (!) 102/59  A/P: Poor control on hydralazine 25 mg 3 times a day as well as chlorthalidone 25 mg up to twice a day. Patient previously on diltiazem and irbesartan as well-heart rate is low normal on home readings and here so we opted to hold off on restart for now despite atrial fibrillation history-I am not sure if she can tolerate a lower heart rate. We will restart irbesartan 300 mg-she cuts this in half and takes twice daily-insurance will not cover 150 mg tablets to be taken twice daily specifically. Update me in 1 week with home readings and follow-up in office in 2 to 3 weeks unless needed sooner  #hyperlipidemia S: Medication: has been off crestor once weekly Lab Results  Component Value Date   CHOL 199 07/28/2018   HDL 53.10 07/28/2018   LDLCALC 124 (H) 07/28/2018   TRIG 180 (H) 10/11/2019   CHOLHDL 4 07/28/2018   A/P: Suspect poor control of lipids-with history of stroke would prefer LDL under 70 if possible-restart Crestor 5 mg once a week-strongly suspect we will have to titrate up but patient prefers lowest possible dose especially with start of other medicines-we will need to wait at least 6 weeks for full benefit of statin  #Anemia-patient has never submitted stool cards-she states she did not receive in the mail-I asked my team to give her this today  #Patient needed updated blood work but our phlebotomist was not available at the end of our visit and we opted to repeat at 2 to 3-week visit  Recommended follow up: 2 to 3-week visit Future Appointments  Date Time Provider [place]  12/08/2019  2:20 PM [name], MD [facility]  12/13/2019   2:30 PM Lorretta Harp, MD [facility]  12/14/2019 12:30 PM [facility] None  12/14/2019  1:00 PM Heilingoetter, Cassandra L, PA-C [facility] None    Lab/Order associations:   ICD-10-CM   1. Essential hypertension  I10   2. Hyperlipidemia, unspecified hyperlipidemia type  E78.5     Meds ordered this encounter  Medications  . chlorthalidone (HYGROTON) 25 MG tablet    Sig: Take 1 tablet (25 mg total) by mouth daily.    Dispense:  90 tablet    Refill:  3  . irbesartan (AVAPRO) 300 MG tablet    Sig: Take 1 tablet (300 mg total) by mouth daily.    Dispense:  90 tablet    Refill:  3   Return precautions advised.  Garret Reddish, MD

## 2019-11-23 DIAGNOSIS — N179 Acute kidney failure, unspecified: Secondary | ICD-10-CM | POA: Diagnosis not present

## 2019-11-23 DIAGNOSIS — J9601 Acute respiratory failure with hypoxia: Secondary | ICD-10-CM | POA: Diagnosis not present

## 2019-11-23 DIAGNOSIS — I129 Hypertensive chronic kidney disease with stage 1 through stage 4 chronic kidney disease, or unspecified chronic kidney disease: Secondary | ICD-10-CM | POA: Diagnosis not present

## 2019-11-23 DIAGNOSIS — L89152 Pressure ulcer of sacral region, stage 2: Secondary | ICD-10-CM | POA: Diagnosis not present

## 2019-11-23 DIAGNOSIS — J1282 Pneumonia due to coronavirus disease 2019: Secondary | ICD-10-CM | POA: Diagnosis not present

## 2019-11-23 DIAGNOSIS — U071 COVID-19: Secondary | ICD-10-CM | POA: Diagnosis not present

## 2019-11-24 ENCOUNTER — Other Ambulatory Visit (INDEPENDENT_AMBULATORY_CARE_PROVIDER_SITE_OTHER): Payer: Medicare Other

## 2019-11-24 DIAGNOSIS — U071 COVID-19: Secondary | ICD-10-CM | POA: Diagnosis not present

## 2019-11-24 DIAGNOSIS — D649 Anemia, unspecified: Secondary | ICD-10-CM

## 2019-11-24 DIAGNOSIS — I129 Hypertensive chronic kidney disease with stage 1 through stage 4 chronic kidney disease, or unspecified chronic kidney disease: Secondary | ICD-10-CM | POA: Diagnosis not present

## 2019-11-24 DIAGNOSIS — N179 Acute kidney failure, unspecified: Secondary | ICD-10-CM | POA: Diagnosis not present

## 2019-11-24 DIAGNOSIS — N8189 Other female genital prolapse: Secondary | ICD-10-CM | POA: Diagnosis not present

## 2019-11-24 DIAGNOSIS — L89152 Pressure ulcer of sacral region, stage 2: Secondary | ICD-10-CM | POA: Diagnosis not present

## 2019-11-24 DIAGNOSIS — J9601 Acute respiratory failure with hypoxia: Secondary | ICD-10-CM | POA: Diagnosis not present

## 2019-11-24 DIAGNOSIS — J1282 Pneumonia due to coronavirus disease 2019: Secondary | ICD-10-CM | POA: Diagnosis not present

## 2019-11-24 LAB — FECAL OCCULT BLOOD, IMMUNOCHEMICAL: Fecal Occult Bld: NEGATIVE

## 2019-11-27 DIAGNOSIS — I129 Hypertensive chronic kidney disease with stage 1 through stage 4 chronic kidney disease, or unspecified chronic kidney disease: Secondary | ICD-10-CM | POA: Diagnosis not present

## 2019-11-27 DIAGNOSIS — U071 COVID-19: Secondary | ICD-10-CM | POA: Diagnosis not present

## 2019-11-27 DIAGNOSIS — L89152 Pressure ulcer of sacral region, stage 2: Secondary | ICD-10-CM | POA: Diagnosis not present

## 2019-11-27 DIAGNOSIS — J9601 Acute respiratory failure with hypoxia: Secondary | ICD-10-CM | POA: Diagnosis not present

## 2019-11-27 DIAGNOSIS — J1282 Pneumonia due to coronavirus disease 2019: Secondary | ICD-10-CM | POA: Diagnosis not present

## 2019-11-27 DIAGNOSIS — N179 Acute kidney failure, unspecified: Secondary | ICD-10-CM | POA: Diagnosis not present

## 2019-11-29 DIAGNOSIS — J9601 Acute respiratory failure with hypoxia: Secondary | ICD-10-CM | POA: Diagnosis not present

## 2019-11-29 DIAGNOSIS — N179 Acute kidney failure, unspecified: Secondary | ICD-10-CM | POA: Diagnosis not present

## 2019-11-29 DIAGNOSIS — U071 COVID-19: Secondary | ICD-10-CM | POA: Diagnosis not present

## 2019-11-29 DIAGNOSIS — J1282 Pneumonia due to coronavirus disease 2019: Secondary | ICD-10-CM | POA: Diagnosis not present

## 2019-11-29 DIAGNOSIS — I129 Hypertensive chronic kidney disease with stage 1 through stage 4 chronic kidney disease, or unspecified chronic kidney disease: Secondary | ICD-10-CM | POA: Diagnosis not present

## 2019-11-29 DIAGNOSIS — L89152 Pressure ulcer of sacral region, stage 2: Secondary | ICD-10-CM | POA: Diagnosis not present

## 2019-11-30 DIAGNOSIS — J9601 Acute respiratory failure with hypoxia: Secondary | ICD-10-CM | POA: Diagnosis not present

## 2019-11-30 DIAGNOSIS — J1282 Pneumonia due to coronavirus disease 2019: Secondary | ICD-10-CM | POA: Diagnosis not present

## 2019-11-30 DIAGNOSIS — L89152 Pressure ulcer of sacral region, stage 2: Secondary | ICD-10-CM | POA: Diagnosis not present

## 2019-11-30 DIAGNOSIS — I129 Hypertensive chronic kidney disease with stage 1 through stage 4 chronic kidney disease, or unspecified chronic kidney disease: Secondary | ICD-10-CM | POA: Diagnosis not present

## 2019-11-30 DIAGNOSIS — N179 Acute kidney failure, unspecified: Secondary | ICD-10-CM | POA: Diagnosis not present

## 2019-11-30 DIAGNOSIS — U071 COVID-19: Secondary | ICD-10-CM | POA: Diagnosis not present

## 2019-12-01 DIAGNOSIS — L89152 Pressure ulcer of sacral region, stage 2: Secondary | ICD-10-CM | POA: Diagnosis not present

## 2019-12-01 DIAGNOSIS — I129 Hypertensive chronic kidney disease with stage 1 through stage 4 chronic kidney disease, or unspecified chronic kidney disease: Secondary | ICD-10-CM | POA: Diagnosis not present

## 2019-12-01 DIAGNOSIS — U071 COVID-19: Secondary | ICD-10-CM | POA: Diagnosis not present

## 2019-12-01 DIAGNOSIS — J1282 Pneumonia due to coronavirus disease 2019: Secondary | ICD-10-CM | POA: Diagnosis not present

## 2019-12-01 DIAGNOSIS — J9601 Acute respiratory failure with hypoxia: Secondary | ICD-10-CM | POA: Diagnosis not present

## 2019-12-01 DIAGNOSIS — N179 Acute kidney failure, unspecified: Secondary | ICD-10-CM | POA: Diagnosis not present

## 2019-12-05 NOTE — Progress Notes (Signed)
Phone [phone] In person visit   Subjective:   [name] is a 86 y.o. year old very pleasant female patient who presents for/with See problem oriented charting Chief Complaint  Patient presents with  . Hypertension   This visit occurred during the SARS-CoV-2 public health emergency.  Safety protocols were in place, including screening questions prior to the visit, additional usage of staff PPE, and extensive cleaning of exam room while observing appropriate contact time as indicated for disinfecting solutions.   Past Medical History-  Patient Active Problem List   Diagnosis Date Noted  . COVID-19 10/12/2019    Priority: High  . Chronic cough 08/12/2015    Priority: High  . PAD (peripheral artery disease) ([place]) 05/10/2013    Priority: High  . PAF (paroxysmal atrial fibrillation) ([facility])     Priority: High  . History of stroke     Priority: High  . CKD (chronic kidney disease), stage III 06/15/2018    Priority: Medium  . Hyperglycemia 06/25/2014    Priority: Medium  . Hypothyroidism 06/07/2014    Priority: Medium  . Hyperlipidemia 06/07/2014    Priority: Medium  . Hypertension     Priority: Medium  . GERD (gastroesophageal reflux disease) 06/25/2014    Priority: Low  . Vitamin D deficiency 06/25/2014    Priority: Low  . Intracervical pessary 06/07/2014    Priority: Low  . Sinus bradycardia 05/19/2013    Priority: Low  . PAC (premature atrial contraction) 05/20/2011    Priority: Low  . Low vitamin B12 level 12/09/2017  . Anemia 08/16/2017  . Perennial allergic rhinitis 08/12/2015    Medications- reviewed and updated Current Outpatient Medications  Medication Sig Dispense Refill  . apixaban (ELIQUIS) 2.5 MG TABS tablet Take 1 tablet (2.5 mg total) by mouth 2 (two) times daily. 180 tablet 3  . cephALEXin (KEFLEX) 250 MG capsule Take 1-2 capsules (250-500 mg total) by mouth every 8 (eight) hours. Take 2 tablets if tolerate every 8 hours. 1 tablet if do not  tolerate but still every 8 hours 42 capsule 0  . chlorthalidone (HYGROTON) 25 MG tablet Take 1 tablet (25 mg total) by mouth daily. 90 tablet 3  . Digestive Enzyme CAPS Take 1 capsule by mouth See admin instructions. Mix the contents of 1 capsule into water and drink with all meals    . dorzolamide-timolol (COSOPT) 22.3-6.8 MG/ML ophthalmic solution Place 1 drop into the left eye in the morning and at bedtime.     . hydrALAZINE (APRESOLINE) 25 MG tablet Take 25 mg by mouth 3 (three) times daily.    . irbesartan (AVAPRO) 300 MG tablet Take 1 tablet (300 mg total) by mouth daily. 90 tablet 3  . Multiple Vitamin (MULTIVITAMIN) LIQD Take 30 mLs by mouth daily.    . Nutritional Supplements (FEEDING SUPPLEMENT, KATE FARMS STANDARD 1.4,) LIQD liquid Take 325 mLs by mouth 3 (three) times daily between meals.    . Papaya TABS Take 3 tablets by mouth 3 (three) times daily after meals.    . Probiotic CAPS Take 1 capsule by mouth daily.    [name] thyroid (ARMOUR THYROID) 15 MG tablet Take 1 tablet (15 mg total) by mouth daily. 90 tablet 1   No current facility-administered medications for this visit.     Objective:  BP (!) 126/58   Pulse (!) 57   Temp 97.9 F (36.6 C) (Temporal)   Resp 18   Ht 5\' 3"  (1.6 m)   Wt 114 lb  9.6 oz (52 kg)   SpO2 98%   BMI 20.30 kg/m  Gen: NAD, resting comfortably CV: RRR no murmurs rubs or gallops Lungs: CTAB no crackles, wheeze, rhonchi Ext: no edema Skin: warm, dry    Assessment and Plan   # Declines flu shot for season. Declines covid vaccine for now. Daughter strongly prefers no vaccine as has natural protection to a degree now.   #hypertension S: medication: Chlorthalidone 25Mg , Hydralazine 25Mg  3 times a day, irbesartan 300MG - plan was to take half twice daily- she is taking half in the evening as BP was running lower.  Home readings #s: at nighttime can get up to 160s but usuaully controlled in the daytime but this is prior to 3rd dose of hydralazine.  Occasionally will not take 2nd dose if blood pressure on lower end- such as 120s.  BP Readings from Last 3 Encounters:  12/06/19 (!) 126/58  11/22/19 (!) 148/76  11/03/19 130/60  A/P: blood pressure controlled on chlorthalidone 25mg , hydralazine 25mg  two to three times a day and irbesartan 150mg  before bed. Some higher in PM before hydralazine dose- I think this is still reasonable- could move hydralazine dose up slightly if she wanted.   #hyperlipidemia S: Medication: At last visit patient was encouraged to restart her Crestor 5 mg once weekly as she has been very hesitant about statins.  She reports she feels like it causes her to cough.    With history of stroke suspect she will need a higher dose Lab Results  Component Value Date   CHOL 199 07/28/2018   HDL 53.10 07/28/2018   LDLCALC 124 (H) 07/28/2018   TRIG 180 (H) 10/11/2019   CHOLHDL 4 07/28/2018   A/P: due to cough which worsens on rosuvastatin even with stroke history she opts not to take statin.    #Anemia/thrombocytopenia -stool cards were negative for blood.  Thankfully patient does appear to have follow-up with oncology planned.  Hemoglobin was as low as 7.2 and platelets as low as 130 within the last month.  Prior to hospitalization platelets were normal and hemoglobin was typically at least above 10. Update cbc, cmp, spep - she can ask oncology if they still need their bloodwork after reviewing what I have done today.   #hypothyroidism S: compliant On thyroid medication-armour thyroid 15 mg  Lab Results  Component Value Date   TSH 2.713 10/16/2019   A/P:stable on last check- continue current meds- did not feel strongly about repeat this soon   # Atrial fibrillation S: Rate controlled without meds Anticoagulated with eliquis 2.5 mg BID Patient is  followed by cardiology:   A/P: controlled. Continue current meds. She needed a refill on eliquis (cardiology has given this) and as we did not want her to run out we refilled  today  Recommended follow up: Return in about 3 months (around 03/07/2020) for follow up- or sooner if needed. Future Appointments  Date Time Provider [place]  12/14/2019 12:30 PM [facility] None  12/14/2019  1:00 PM Heilingoetter, Cassandra L, PA-C [facility] None  01/04/2020 11:45 AM Cleaver, Jossie Ng, NP [facility]   Lab/Order associations:   ICD-10-CM   1. Primary hypertension  I10 CBC With Differential/Platelet    COMPLETE METABOLIC PANEL WITH GFR  2. Anemia, unspecified type  D64.9 CBC With Differential/Platelet    COMPLETE METABOLIC PANEL WITH GFR    Protein electrophoresis, serum  3. Hyperlipidemia, unspecified hyperlipidemia type  E78.5 CBC With Differential/Platelet    COMPLETE METABOLIC PANEL  WITH GFR    Lipid Panel (Refl)   Meds ordered this encounter  Medications  . apixaban (ELIQUIS) 2.5 MG TABS tablet    Sig: Take 1 tablet (2.5 mg total) by mouth 2 (two) times daily.    Dispense:  180 tablet    Refill:  3   Return precautions advised.  Garret Reddish, MD

## 2019-12-05 NOTE — Patient Instructions (Addendum)
Health Maintenance Due  Topic Date Due  . COVID-19 Vaccine (1) declines for now Never done  . INFLUENZA VACCINE declines for season Never done   Please stop by lab before you go If you have mychart- we will send your results within 3 business days of [place] receiving them.  If you do not have mychart- we will call you about results within 5 business days of [place] receiving them.  *please note we are currently using [facility] which has a longer processing time than [facility] typically so labs may not come back as quickly as in the past *please also note that you will see labs on mychart as soon as they post. I will later go in and write notes on them- will say "notes from Dr. Yong Channel"  blood pressure controlled on chlorthalidone 25mg , hydralazine 25mg  two to three times a day and irbesartan 150mg  before bed. Some higher in PM before hydralazine dose- I think this is still reasonable- could move hydralazine dose up slightly if she wanted.   Update cbc, cmp, spep - she can ask oncology if they still need their bloodwork after reviewing what I have done today.

## 2019-12-06 ENCOUNTER — Ambulatory Visit (INDEPENDENT_AMBULATORY_CARE_PROVIDER_SITE_OTHER): Payer: Medicare Other | Admitting: Family Medicine

## 2019-12-06 ENCOUNTER — Encounter: Payer: Self-pay | Admitting: Family Medicine

## 2019-12-06 ENCOUNTER — Other Ambulatory Visit: Payer: Self-pay

## 2019-12-06 VITALS — BP 126/58 | HR 57 | Temp 97.9°F | Resp 18 | Ht 63.0 in | Wt 114.6 lb

## 2019-12-06 DIAGNOSIS — I10 Essential (primary) hypertension: Secondary | ICD-10-CM

## 2019-12-06 DIAGNOSIS — D649 Anemia, unspecified: Secondary | ICD-10-CM

## 2019-12-06 DIAGNOSIS — E785 Hyperlipidemia, unspecified: Secondary | ICD-10-CM

## 2019-12-06 MED ORDER — APIXABAN 2.5 MG PO TABS
2.5000 mg | ORAL_TABLET | Freq: Two times a day (BID) | ORAL | 3 refills | Status: DC
Start: 2019-12-06 — End: 2020-10-08

## 2019-12-07 DIAGNOSIS — J9601 Acute respiratory failure with hypoxia: Secondary | ICD-10-CM | POA: Diagnosis not present

## 2019-12-07 DIAGNOSIS — I129 Hypertensive chronic kidney disease with stage 1 through stage 4 chronic kidney disease, or unspecified chronic kidney disease: Secondary | ICD-10-CM | POA: Diagnosis not present

## 2019-12-07 DIAGNOSIS — L89152 Pressure ulcer of sacral region, stage 2: Secondary | ICD-10-CM | POA: Diagnosis not present

## 2019-12-07 DIAGNOSIS — J1282 Pneumonia due to coronavirus disease 2019: Secondary | ICD-10-CM | POA: Diagnosis not present

## 2019-12-07 DIAGNOSIS — N179 Acute kidney failure, unspecified: Secondary | ICD-10-CM | POA: Diagnosis not present

## 2019-12-07 DIAGNOSIS — U071 COVID-19: Secondary | ICD-10-CM | POA: Diagnosis not present

## 2019-12-08 ENCOUNTER — Ambulatory Visit: Payer: Medicare Other | Admitting: Family Medicine

## 2019-12-08 DIAGNOSIS — J9601 Acute respiratory failure with hypoxia: Secondary | ICD-10-CM | POA: Diagnosis not present

## 2019-12-08 DIAGNOSIS — L89152 Pressure ulcer of sacral region, stage 2: Secondary | ICD-10-CM | POA: Diagnosis not present

## 2019-12-08 DIAGNOSIS — N179 Acute kidney failure, unspecified: Secondary | ICD-10-CM | POA: Diagnosis not present

## 2019-12-08 DIAGNOSIS — I129 Hypertensive chronic kidney disease with stage 1 through stage 4 chronic kidney disease, or unspecified chronic kidney disease: Secondary | ICD-10-CM | POA: Diagnosis not present

## 2019-12-08 DIAGNOSIS — U071 COVID-19: Secondary | ICD-10-CM | POA: Diagnosis not present

## 2019-12-08 DIAGNOSIS — J1282 Pneumonia due to coronavirus disease 2019: Secondary | ICD-10-CM | POA: Diagnosis not present

## 2019-12-08 LAB — PROTEIN ELECTROPHORESIS, SERUM
Albumin ELP: 4 g/dL (ref 3.8–4.8)
Alpha 1: 0.3 g/dL (ref 0.2–0.3)
Alpha 2: 0.8 g/dL (ref 0.5–0.9)
Beta 2: 0.2 g/dL (ref 0.2–0.5)
Beta Globulin: 0.4 g/dL (ref 0.4–0.6)
Gamma Globulin: 1 g/dL (ref 0.8–1.7)
Total Protein: 6.8 g/dL (ref 6.1–8.1)

## 2019-12-08 LAB — COMPLETE METABOLIC PANEL WITH GFR
AG Ratio: 1.3 (calc) (ref 1.0–2.5)
ALT: 10 U/L (ref 6–29)
AST: 18 U/L (ref 10–35)
Albumin: 3.8 g/dL (ref 3.6–5.1)
Alkaline phosphatase (APISO): 52 U/L (ref 37–153)
BUN/Creatinine Ratio: 25 (calc) — ABNORMAL HIGH (ref 6–22)
BUN: 41 mg/dL — ABNORMAL HIGH (ref 7–25)
CO2: 29 mmol/L (ref 20–32)
Calcium: 9.3 mg/dL (ref 8.6–10.4)
Chloride: 101 mmol/L (ref 98–110)
Creat: 1.65 mg/dL — ABNORMAL HIGH (ref 0.60–0.88)
GFR, Est African American: 31 mL/min/{1.73_m2} — ABNORMAL LOW (ref >=60)
GFR, Est Non African American: 27 mL/min/{1.73_m2} — ABNORMAL LOW (ref >=60)
Globulin: 2.9 g/dL (calc) (ref 1.9–3.7)
Glucose, Bld: 89 mg/dL (ref 65–99)
Potassium: 4.4 mmol/L (ref 3.5–5.3)
Sodium: 139 mmol/L (ref 135–146)
Total Bilirubin: 0.4 mg/dL (ref 0.2–1.2)
Total Protein: 6.7 g/dL (ref 6.1–8.1)

## 2019-12-08 LAB — CBC WITH DIFFERENTIAL/PLATELET
Absolute Monocytes: 548 cells/uL (ref 200–950)
Basophils Absolute: 11 cells/uL (ref 0–200)
Basophils Relative: 0.3 %
Eosinophils Absolute: 48 cells/uL (ref 15–500)
Eosinophils Relative: 1.3 %
HCT: 29.8 % — ABNORMAL LOW (ref 35.0–45.0)
Hemoglobin: 9.5 g/dL — ABNORMAL LOW (ref 11.7–15.5)
Lymphs Abs: 899 cells/uL (ref 850–3900)
MCH: 27.8 pg (ref 27.0–33.0)
MCHC: 31.9 g/dL — ABNORMAL LOW (ref 32.0–36.0)
MCV: 87.1 fL (ref 80.0–100.0)
MPV: 11.6 fL (ref 7.5–12.5)
Monocytes Relative: 14.8 %
Neutro Abs: 2194 cells/uL (ref 1500–7800)
Neutrophils Relative %: 59.3 %
Platelets: 244 10*3/uL (ref 140–400)
RBC: 3.42 10*6/uL — ABNORMAL LOW (ref 3.80–5.10)
RDW: 13.6 % (ref 11.0–15.0)
Total Lymphocyte: 24.3 %
WBC: 3.7 10*3/uL — ABNORMAL LOW (ref 3.8–10.8)

## 2019-12-08 LAB — LIPID PANEL (REFL)
Cholesterol: 225 mg/dL — ABNORMAL HIGH (ref <200)
HDL: 67 mg/dL (ref >=50)
LDL Cholesterol (Calc): 133 mg/dL (calc) — ABNORMAL HIGH
Non-HDL Cholesterol (Calc): 158 mg/dL (calc) — ABNORMAL HIGH (ref <130)
Total CHOL/HDL Ratio: 3.4 (calc) (ref <5.0)
Triglycerides: 141 mg/dL (ref <150)

## 2019-12-12 DIAGNOSIS — I129 Hypertensive chronic kidney disease with stage 1 through stage 4 chronic kidney disease, or unspecified chronic kidney disease: Secondary | ICD-10-CM | POA: Diagnosis not present

## 2019-12-12 DIAGNOSIS — J9601 Acute respiratory failure with hypoxia: Secondary | ICD-10-CM | POA: Diagnosis not present

## 2019-12-12 DIAGNOSIS — J1282 Pneumonia due to coronavirus disease 2019: Secondary | ICD-10-CM | POA: Diagnosis not present

## 2019-12-12 DIAGNOSIS — U071 COVID-19: Secondary | ICD-10-CM | POA: Diagnosis not present

## 2019-12-12 DIAGNOSIS — L89152 Pressure ulcer of sacral region, stage 2: Secondary | ICD-10-CM | POA: Diagnosis not present

## 2019-12-12 DIAGNOSIS — N179 Acute kidney failure, unspecified: Secondary | ICD-10-CM | POA: Diagnosis not present

## 2019-12-13 ENCOUNTER — Encounter (HOSPITAL_COMMUNITY): Payer: Medicare Other

## 2019-12-13 ENCOUNTER — Ambulatory Visit: Payer: Medicare Other | Admitting: Cardiovascular Disease

## 2019-12-14 ENCOUNTER — Other Ambulatory Visit: Payer: Self-pay | Admitting: Physician Assistant

## 2019-12-14 ENCOUNTER — Inpatient Hospital Stay: Payer: Medicare Other | Attending: Physician Assistant | Admitting: Physician Assistant

## 2019-12-14 ENCOUNTER — Other Ambulatory Visit: Payer: Self-pay

## 2019-12-14 ENCOUNTER — Inpatient Hospital Stay: Payer: Medicare Other

## 2019-12-14 VITALS — BP 136/49 | HR 54 | Temp 97.4°F | Resp 18 | Ht 63.0 in | Wt 116.3 lb

## 2019-12-14 DIAGNOSIS — Z7901 Long term (current) use of anticoagulants: Secondary | ICD-10-CM | POA: Insufficient documentation

## 2019-12-14 DIAGNOSIS — Z801 Family history of malignant neoplasm of trachea, bronchus and lung: Secondary | ICD-10-CM | POA: Diagnosis not present

## 2019-12-14 DIAGNOSIS — Z886 Allergy status to analgesic agent status: Secondary | ICD-10-CM | POA: Insufficient documentation

## 2019-12-14 DIAGNOSIS — Z8049 Family history of malignant neoplasm of other genital organs: Secondary | ICD-10-CM | POA: Insufficient documentation

## 2019-12-14 DIAGNOSIS — R059 Cough, unspecified: Secondary | ICD-10-CM | POA: Insufficient documentation

## 2019-12-14 DIAGNOSIS — Z888 Allergy status to other drugs, medicaments and biological substances status: Secondary | ICD-10-CM | POA: Insufficient documentation

## 2019-12-14 DIAGNOSIS — D72819 Decreased white blood cell count, unspecified: Secondary | ICD-10-CM | POA: Insufficient documentation

## 2019-12-14 DIAGNOSIS — Z881 Allergy status to other antibiotic agents status: Secondary | ICD-10-CM | POA: Insufficient documentation

## 2019-12-14 DIAGNOSIS — I129 Hypertensive chronic kidney disease with stage 1 through stage 4 chronic kidney disease, or unspecified chronic kidney disease: Secondary | ICD-10-CM | POA: Diagnosis not present

## 2019-12-14 DIAGNOSIS — Z8673 Personal history of transient ischemic attack (TIA), and cerebral infarction without residual deficits: Secondary | ICD-10-CM | POA: Diagnosis not present

## 2019-12-14 DIAGNOSIS — D649 Anemia, unspecified: Secondary | ICD-10-CM

## 2019-12-14 DIAGNOSIS — E039 Hypothyroidism, unspecified: Secondary | ICD-10-CM | POA: Insufficient documentation

## 2019-12-14 DIAGNOSIS — D631 Anemia in chronic kidney disease: Secondary | ICD-10-CM | POA: Insufficient documentation

## 2019-12-14 DIAGNOSIS — R112 Nausea with vomiting, unspecified: Secondary | ICD-10-CM | POA: Diagnosis not present

## 2019-12-14 DIAGNOSIS — U071 COVID-19: Secondary | ICD-10-CM | POA: Diagnosis not present

## 2019-12-14 DIAGNOSIS — Z8249 Family history of ischemic heart disease and other diseases of the circulatory system: Secondary | ICD-10-CM | POA: Diagnosis not present

## 2019-12-14 DIAGNOSIS — N183 Chronic kidney disease, stage 3 unspecified: Secondary | ICD-10-CM

## 2019-12-14 DIAGNOSIS — Z79899 Other long term (current) drug therapy: Secondary | ICD-10-CM | POA: Insufficient documentation

## 2019-12-14 DIAGNOSIS — J9601 Acute respiratory failure with hypoxia: Secondary | ICD-10-CM | POA: Diagnosis not present

## 2019-12-14 DIAGNOSIS — E785 Hyperlipidemia, unspecified: Secondary | ICD-10-CM | POA: Diagnosis not present

## 2019-12-14 DIAGNOSIS — J1282 Pneumonia due to coronavirus disease 2019: Secondary | ICD-10-CM | POA: Diagnosis not present

## 2019-12-14 DIAGNOSIS — L89152 Pressure ulcer of sacral region, stage 2: Secondary | ICD-10-CM | POA: Diagnosis not present

## 2019-12-14 DIAGNOSIS — I480 Paroxysmal atrial fibrillation: Secondary | ICD-10-CM | POA: Diagnosis not present

## 2019-12-14 DIAGNOSIS — D638 Anemia in other chronic diseases classified elsewhere: Secondary | ICD-10-CM

## 2019-12-14 DIAGNOSIS — K219 Gastro-esophageal reflux disease without esophagitis: Secondary | ICD-10-CM | POA: Diagnosis not present

## 2019-12-14 DIAGNOSIS — I739 Peripheral vascular disease, unspecified: Secondary | ICD-10-CM | POA: Diagnosis not present

## 2019-12-14 DIAGNOSIS — Z809 Family history of malignant neoplasm, unspecified: Secondary | ICD-10-CM

## 2019-12-14 DIAGNOSIS — R002 Palpitations: Secondary | ICD-10-CM | POA: Diagnosis not present

## 2019-12-14 DIAGNOSIS — N179 Acute kidney failure, unspecified: Secondary | ICD-10-CM | POA: Diagnosis not present

## 2019-12-14 LAB — CBC WITH DIFFERENTIAL (CANCER CENTER ONLY)
Abs Immature Granulocytes: 0.01 10*3/uL (ref 0.00–0.07)
Basophils Absolute: 0 10*3/uL (ref 0.0–0.1)
Basophils Relative: 0 %
Eosinophils Absolute: 0 10*3/uL (ref 0.0–0.5)
Eosinophils Relative: 1 %
HCT: 29.7 % — ABNORMAL LOW (ref 36.0–46.0)
Hemoglobin: 9.3 g/dL — ABNORMAL LOW (ref 12.0–15.0)
Immature Granulocytes: 0 %
Lymphocytes Relative: 24 %
Lymphs Abs: 0.8 10*3/uL (ref 0.7–4.0)
MCH: 27.5 pg (ref 26.0–34.0)
MCHC: 31.3 g/dL (ref 30.0–36.0)
MCV: 87.9 fL (ref 80.0–100.0)
Monocytes Absolute: 0.5 10*3/uL (ref 0.1–1.0)
Monocytes Relative: 14 %
Neutro Abs: 2 10*3/uL (ref 1.7–7.7)
Neutrophils Relative %: 61 %
Platelet Count: 208 10*3/uL (ref 150–400)
RBC: 3.38 MIL/uL — ABNORMAL LOW (ref 3.87–5.11)
RDW: 14.4 % (ref 11.5–15.5)
WBC Count: 3.4 10*3/uL — ABNORMAL LOW (ref 4.0–10.5)
nRBC: 0 % (ref 0.0–0.2)

## 2019-12-14 NOTE — Progress Notes (Signed)
[place] [phone]) [phone]   [phone]) [phone]  CONSULT NOTE  REFERRING PHYSICIAN: Dr. Yong Channel   REASON FOR CONSULTATION:  Anemia and leukocytopenia  HPI [name] is a 86 y.o. female with a past medical history significant for chronic kidney disease, hypothyroidism, GERD, history of stroke x4, hyperlipidemia, vitamin B12 deficiency, COVID-19, GERD, peripheral vascular disease, hypertension, atrial fibrillation on anticoagulation with Eliquis is referred to the clinic for evaluation of anemia and leukocytopenia.  Per chart review, it appears that the patient started having a mild intermittent leukocytopenia and/or mild anemia dating back at least 6 years ago. She also had anemia as a teenage but states this resolved. Over the last 6 years or so, her white blood cell counts typically were in the range of 3-4 and her hemoglobin typically was normocytic and greater than 10. However, 2 months ago the patient has leukocytosis which is likely reactive.  More recently in August 2021, the patient was diagnosed with COVID-19.  During her hospitalization her hemoglobin got as low as 7.4.  No blood transfusion was given at that time due to the patient's daughter not wishing to proceed with the blood transfusion.  The patient had further evaluation with iron studies, B12, folate, reticulocytes, and ferritin.  The patient's folate was in normal limits, the vitamin B12 was high.  The patient's iron studies were unremarkable and her ferritin was high.  She also has chronic kidney disease and was referred to a nephrologist but she has not had an appointment yet. Her last CMP showed a creatinine around 1.6.   Overall, the patient has not very symptomatic.  When asked about fatigue or symptoms of anemia she states that "I never paid it any mind".  She denies any shortness of breath with exertion.  Denies any lightheadedness.  The patient occasionally feels palpitations associated with  her atrial fibrillation.  Of note, she is on Eliquis for her atrial fibrillation.  She does have a history of stroke x4 per patient report.  Patient denies any abnormal bleeding or bruising including epistaxis, gingival bleeding, hemoptysis, hematemesis, melena, or hematuria.  Of note, the patient had a urinalysis performed when she was in the hospital which was negative for any blood.  The patient did state that last week she had mild vaginal bleeding that happened after she saw her OB/GYN to have her pessary removed.  The patient had stool cards performed recently which were negative for occult blood.  The patient denies any particular dietary habits such as being a vegan or vegetarian; however, she does not eat a lot of red meat.  The patient denies any aspirin or NSAID use.  The patient has not had a colonoscopy in several years and cannot recall the last time she had a colonoscopy.  She denies any herbal supplements.  She denies any diarrhea or constipation or abdominal pain.  She states that she had some nausea and vomiting yesterday after she was coughing in "gagging" on phlegm.  States that she had a bladder infection a few months ago and was treated with antibiotics but she cannot recall which antibiotic.  She denies any history of any other viral infections such as hepatitis, HIV, or mono. The patient's main concern is related to coughing. She denies frequent infections. She had night sweats when she had covid-19 but none since that time. She denies recent unexplained weight loss.   The patient denies any family history of sickle cell anemia or thalassemia.  She  states that she had a sister who was anemic.  The patient's mother had a "heart condition" and hypertension.  The patient's father passed away secondary to a bleeding ulcer.  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  Hires for that she worked at an [employer].  The patient is widowed and has 4 children.  She denies  alcohol, drug, or tobacco use.     HPI  Past Medical History:  Diagnosis Date  . Arthritis   . Chicken pox   . Glaucoma of both eyes   . High cholesterol   . Hypertension    a. Normal renal arteries by duplex 2013.  [name] Hypothyroidism   . Migraine   . PAD (peripheral artery disease) ([place])    a. 04/2013: s/p PCI to occluded right popliteal artery   . PAF (paroxysmal atrial fibrillation) ([place])    a. s/p MAZE 2006. b. Event monitor 2013: NSR with PACs.  . Sinus bradycardia    a. 03/2011 during hospital admission - beta blocker stopped.  . Stroke [facility])    a. Pt reports 3-4 prior strokes without residual sx. b. CVA 2013 (presented with headache) - started on Plavix. Event monitor as outpt - no AF.    Past Surgical History:  Procedure Laterality Date  . CATARACT EXTRACTION Left 11/09/2017  . LOWER EXTREMITY ANGIOGRAM N/A 05/15/2013   Procedure: LOWER EXTREMITY ANGIOGRAM;  Surgeon: Lorretta Harp, MD;  Location: [facility] CATH LAB;  Service: Cardiovascular;  Laterality: N/A;  . MAZE  ~ 2006  . POPLITEAL ARTERY STENT  05/15/2013    Family History  Problem Relation Age of Onset  . Hypertension Mother   . Ulcers Father   . Cervical cancer Sister   . Throat cancer Brother   . Lung cancer Brother   . Cancer Brother   . Allergic rhinitis Neg Hx   . Angioedema Neg Hx   . Asthma Neg Hx   . Atopy Neg Hx   . Eczema Neg Hx   . Immunodeficiency Neg Hx   . Urticaria Neg Hx     Social History Social History   Tobacco Use  . Smoking status: Never Smoker  . Smokeless tobacco: Never Used  Vaping Use  . Vaping Use: Never used  Substance Use Topics  . Alcohol use: No    Alcohol/week: 0.0 standard drinks  . Drug use: No    Allergies  Allergen Reactions  . Aceon [Perindopril Erbumine] Cough  . Amlodipine Other (See Comments)    Lower extremity discomfort  . Aspirin Other (See Comments) and Cough    STOPPED BY A MEDICAL PROFESSIONAL  . Azor [Amlodipine-Olmesartan] Cough  . Beta  Adrenergic Blockers Other (See Comments)    Bradycardia  . Hydralazine Nausea And Vomiting and Other (See Comments)    Weakness- tolerating in 2021   . Milk-Related Compounds Other (See Comments) and Cough    STOPPED BY A MEDICAL PROFESSIONAL  . Other Other (See Comments) and Cough    NUTS- ALL TYPES- (STOPPED BY A MEDICAL PROFESSIONAL) NO FOODS THAT CAUSE GAS (will cause A-Fib)  . Peanut-Containing Drug Products Other (See Comments) and Cough    STOPPED BY A MEDICAL PROFESSIONAL  . Sulfamethoxazole-Trimethoprim Other (See Comments)    Patient cannot specifically recall the reaction  . Vitamin D Analogs Cough    Current Outpatient Medications  Medication Sig Dispense Refill  . apixaban (ELIQUIS) 2.5 MG TABS tablet Take 1 tablet (2.5 mg total) by mouth 2 (  two) times daily. 180 tablet 3  . cephALEXin (KEFLEX) 250 MG capsule Take 1-2 capsules (250-500 mg total) by mouth every 8 (eight) hours. Take 2 tablets if tolerate every 8 hours. 1 tablet if do not tolerate but still every 8 hours 42 capsule 0  . chlorthalidone (HYGROTON) 25 MG tablet Take 1 tablet (25 mg total) by mouth daily. 90 tablet 3  . Digestive Enzyme CAPS Take 1 capsule by mouth See admin instructions. Mix the contents of 1 capsule into water and drink with all meals    . dorzolamide-timolol (COSOPT) 22.3-6.8 MG/ML ophthalmic solution Place 1 drop into the left eye in the morning and at bedtime.     . hydrALAZINE (APRESOLINE) 25 MG tablet Take 25 mg by mouth 3 (three) times daily.    . irbesartan (AVAPRO) 300 MG tablet Take 1 tablet (300 mg total) by mouth daily. 90 tablet 3  . Multiple Vitamin (MULTIVITAMIN) LIQD Take 30 mLs by mouth daily.    . Nutritional Supplements (FEEDING SUPPLEMENT, KATE FARMS STANDARD 1.4,) LIQD liquid Take 325 mLs by mouth 3 (three) times daily between meals.    . Papaya TABS Take 3 tablets by mouth 3 (three) times daily after meals.    . Probiotic CAPS Take 1 capsule by mouth daily.    [name] thyroid  (ARMOUR THYROID) 15 MG tablet Take 1 tablet (15 mg total) by mouth daily. 90 tablet 1   No current facility-administered medications for this visit.    REVIEW OF SYSTEMS:   Review of Systems  Constitutional: Negative for appetite change, chills, fatigue, fever and unexpected weight change.  HENT: Negative for mouth sores, nosebleeds, sore throat and trouble swallowing.   Eyes: Negative for eye problems and icterus.  Respiratory: Positive for cough. Negative for hemoptysis, shortness of breath and wheezing.   Cardiovascular: Negative for chest pain and leg swelling.  Gastrointestinal: Positive for nausea/vomiting x1 yesterday.  Negative for abdominal pain, constipation, diarrhea.  Genitourinary: Negative for bladder incontinence, difficulty urinating, dysuria, frequency and hematuria.   Musculoskeletal: Negative for back pain, gait problem, neck pain and neck stiffness.  Skin: Negative for itching and rash.  Neurological: Negative for dizziness, extremity weakness, gait problem, headaches, light-headedness and seizures.  Hematological: Negative for adenopathy. Does not bruise/bleed easily.  Psychiatric/Behavioral: Negative for confusion, depression and sleep disturbance. The patient is not nervous/anxious.     PHYSICAL EXAMINATION:  Blood pressure (!) 136/49, pulse (!) 54, temperature (!) 97.4 F (36.3 C), temperature source Tympanic, resp. rate 18, height $RemoveBe'5\' 3"'WZYhzodVW$  (1.6 m), weight 116 lb 4.8 oz (52.8 kg), SpO2 100 %.  ECOG PERFORMANCE STATUS: 0-1  Physical Exam  Constitutional: Oriented to person, place, and time and well-developed, well-nourished, and in no distress.   HENT:  Head: Normocephalic and atraumatic.  Mouth/Throat: Oropharynx is clear and moist. No oropharyngeal exudate.  Eyes: Conjunctivae are normal. Right eye exhibits no discharge. Left eye exhibits no discharge. No scleral icterus.  Neck: Normal range of motion. Neck supple.  Cardiovascular: Normal rate, regular rhythm,  normal heart sounds and intact distal pulses.   Pulmonary/Chest: Effort normal and breath sounds normal. No respiratory distress. No wheezes. No rales.  Abdominal: Soft. Bowel sounds are normal. Exhibits no distension and no mass. There is no tenderness.  Musculoskeletal: Normal range of motion. Exhibits no edema.  Lymphadenopathy:    No cervical adenopathy.  Neurological: Alert and oriented to person, place, and time. Exhibits muscle wasting. Gait normal. Coordination normal. Uses a walker for ambulation. Skin: Skin  is warm and dry. No rash noted. Not diaphoretic. No erythema. No pallor.  Psychiatric: Mood, memory and judgment normal.  Vitals reviewed.  LABORATORY DATA: Lab Results  Component Value Date   WBC 3.4 (L) 12/14/2019   HGB 9.3 (L) 12/14/2019   HCT 29.7 (L) 12/14/2019   MCV 87.9 12/14/2019   PLT 208 12/14/2019      Chemistry      Component Value Date/Time   NA 139 12/06/2019 1500   NA 136 (A) 10/31/2019 0000   K 4.4 12/06/2019 1500   CL 101 12/06/2019 1500   CO2 29 12/06/2019 1500   BUN 41 (H) 12/06/2019 1500   BUN 28 (A) 10/31/2019 0000   CREATININE 1.65 (H) 12/06/2019 1500   GLU 90 10/31/2019 0000      Component Value Date/Time   CALCIUM 9.3 12/06/2019 1500   ALKPHOS 76 10/31/2019 0000   AST 18 12/06/2019 1500   ALT 10 12/06/2019 1500   BILITOT 0.4 12/06/2019 1500       RADIOGRAPHIC STUDIES: VAS [place] ABI WITH/WO TBI  Result Date: 11/17/2019 LOWER EXTREMITY DOPPLER STUDY Indications: Peripheral artery disease, and Follow up to popiteal arterial              stent. Patient denies claudication or rest pain symptoms. Since she              was diagnosed with CoVid-19 in early August she has noticed both              legs are swollen every day. High Risk Factors: Hypertension, hyperlipidemia, no history of smoking. Other Factors: SEE RIGHT LEG ARTERIAL DUPLEX REPORT                 Patient has atrial fibrillation.  Vascular Interventions: Right popiteal stent  placed 2015. Comparison Study: Prior ABI 11/30/2018 Right .98 Left .99 Performing Technologist: Salvadore Dom RVT  Examination Guidelines: A complete evaluation includes at minimum, Doppler waveform signals and systolic blood pressure reading at the level of bilateral brachial, anterior tibial, and posterior tibial arteries, when vessel segments are accessible. Bilateral testing is considered an integral part of a complete examination. Photoelectric Plethysmograph (PPG) waveforms and toe systolic pressure readings are included as required and additional duplex testing as needed. Limited examinations for reoccurring indications may be performed as noted.  ABI Findings: +---------+------------------+-----+---------+--------+ Right    Rt Pressure (mmHg)IndexWaveform Comment  +---------+------------------+-----+---------+--------+ Brachial 206                                      +---------+------------------+-----+---------+--------+ ATA      220               1.07 biphasic          +---------+------------------+-----+---------+--------+ PTA      199               0.97 triphasic         +---------+------------------+-----+---------+--------+ PERO     220               1.07 triphasic         +---------+------------------+-----+---------+--------+ Matthew Folks               0.69 Normal            +---------+------------------+-----+---------+--------+ +---------+------------------+-----+----------------+-------+ Left     Lt Pressure (mmHg)IndexWaveform        Comment +---------+------------------+-----+----------------+-------+ Brachial 204                                            +---------+------------------+-----+----------------+-------+  ATA      176               0.85 triphasic               +---------+------------------+-----+----------------+-------+ PTA      220               1.07 barely triphasic         +---------+------------------+-----+----------------+-------+ PERO     209               1.01 triphasic               +---------+------------------+-----+----------------+-------+ Great Toe155               0.75 Normal                  +---------+------------------+-----+----------------+-------+ +-------+-----------+-----------+------------+------------+ ABI/TBIToday's ABIToday's TBIPrevious ABIPrevious TBI +-------+-----------+-----------+------------+------------+ Right  1.07       .69        .98         .51          +-------+-----------+-----------+------------+------------+ Left   1.07       .75        .99         .49          +-------+-----------+-----------+------------+------------+  Bilateral ABIs and TBIs appear increased compared to prior study on 11/30/2018.  Summary: Right: Resting right ankle-brachial index is within normal range. No evidence of significant right lower extremity arterial disease. The right toe-brachial index is abnormal. Left: Resting left ankle-brachial index is within normal range. No evidence of significant left lower extremity arterial disease. The left toe-brachial index is normal.  *See table(s) above for measurements and observations.  Suggest follow up study in 12 months. Electronically signed by Carlyle Dolly MD on 11/17/2019 at 12:17:05 PM.    Final    VAS [place] LOWER EXTREMITY ARTERIAL DUPLEX  Result Date: 11/19/2019 LOWER EXTREMITY ARTERIAL DUPLEX STUDY Indications: Peripheral artery disease, and Follow up to popiteal arterial              stent. Patient denies claudication or rest pain symptoms. Since she              was diagnosed with CoVid-19 in early August she has noticed both              legs are swollen every day. High Risk Factors: Hypertension, hyperlipidemia, no history of smoking. Other Factors: SEE ABI REPORT.  Vascular Interventions: Right popiteal stent placed 2015. Current ABI:            Right 1.07 Left 1.07 Comparison Study:  Prior right leg arterial duplex exam on 11/30/2018 showed                   highest velocity in the proximal SFA with 221 cm/s. Highest                   velocity in the popiteal stent was distal portion at 328 cm/s. Performing Technologist: Salvadore Dom RVT, RDCS (AE), RDMS  Examination Guidelines: A complete evaluation includes B-mode imaging, spectral Doppler, color Doppler, and power Doppler as needed of all accessible portions of each vessel. Bilateral testing is considered an integral part of a complete examination. Limited examinations for reoccurring indications may be performed as noted.  +----------+--------+-----+---------------+---------+-------------------+ RIGHT     PSV cm/sRatioStenosis       Waveform Comments            +----------+--------+-----+---------------+---------+-------------------+  CFA Prox  136                         triphasic                    +----------+--------+-----+---------------+---------+-------------------+ CFA Distal73                          triphasic                    +----------+--------+-----+---------------+---------+-------------------+ DFA       100                         biphasic                     +----------+--------+-----+---------------+---------+-------------------+ SFA Prox  184          30-49% stenosisbiphasic                     +----------+--------+-----+---------------+---------+-------------------+ SFA Mid   95                                                       +----------+--------+-----+---------------+---------+-------------------+ SFA Distal88                                                       +----------+--------+-----+---------------+---------+-------------------+ POP Prox  50                          biphasic                     +----------+--------+-----+---------------+---------+-------------------+ POP Mid                                        SEE STENT WORKSHEET  +----------+--------+-----+---------------+---------+-------------------+ POP Distal105                         biphasic                     +----------+--------+-----+---------------+---------+-------------------+ TP Trunk  62                          biphasic                     +----------+--------+-----+---------------+---------+-------------------+  Right Stent(s): +---------------------------+--------+---------------+--------+--------+ proximal to distal popitealPSV cm/sStenosis       WaveformComments +---------------------------+--------+---------------+--------+--------+ Prox to Stent              50                     biphasic         +---------------------------+--------+---------------+--------+--------+ Proximal Stent             48                     biphasic         +---------------------------+--------+---------------+--------+--------+  Mid Stent                  88                     biphasic         +---------------------------+--------+---------------+--------+--------+ Distal Stent               241     50-99% stenosisbiphasic         +---------------------------+--------+---------------+--------+--------+ Distal to Stent            178     1-49% stenosis biphasic         +---------------------------+--------+---------------+--------+--------+ Distal stent velocities could not be replicated from prior exam.   Summary: Right: Stable 30-49% stenosis noted in the superficial femoral artery. Stable 50-99 % stenosis is noted within the distal portion of popiteal arterial stent. Mild atherosclerosis noted throughout extremity. Intimal thickening noted.  See table(s) above for measurements and observations. Suggest follow up study in 12 months. Electronically signed by Carlyle Dolly MD on 11/19/2019 at 11:52:51 AM.    Final     ASSESSMENT: This is a very pleasant 86 year old African-American female referred to the clinic for anemia and  leukocytopenia.  The patient's anemia is likely secondary to anemia of chronic disease and anemia of chronic kidney disease.   PLAN: The patient was seen with Dr. Julien Nordmann today.  The patient recently had an extensive work-up with an SPEP, vitamin B12, ferritin, iron studies, stool cards, CMP, CBC, and reticulocyte count performed.  Dr. Julien Nordmann discussed that the patient's anemia is likely secondary to anemia of chronic disease as well as anemia of kidney disease as evidenced by her creatinine as well as her elevated ferritin.  Dr. Julien Nordmann discussed that further work-up could possibly include a bone marrow biopsy and aspirate; however, Dr. Julien Nordmann would not recommend this at this time given that the patient is not symptomatic from her anemia and due to her age and comorbidities.  Dr. Julien Nordmann recommends that the patient continue on observation as long as her hemoglobin is acceptable within the 9+ range.  We would be happy to see the patient back for further evaluation and further work-up in the future with new or worsening anemia.   Otherwise, intervention for her anemia secondary to her kidney disease would include Aranesp injections.  The patient states that she is supposed to follow-up with a nephrologist in the near future.  If the patient is interested in Aranesp injections in the future they were advised to let us know; however, Dr. Julien Nordmann believes it is reasonable to hold on that for now.  We will not schedule any follow-up appointments with the patient at this time but would be happy to see her on an as-needed basis for new or worsening anemia.  The patient voices understanding of current disease status and treatment options and is in agreement with the current care plan.  All questions were answered. The patient knows to call the clinic with any problems, questions or concerns. We can certainly see the patient much sooner if necessary.  Thank you so much for allowing me to participate in the  care of MAURIANA DANN. I will continue to follow up the patient with you and assist in her care.  The total time spent in the appointment was 60 minutes.  Disclaimer: This note was dictated with voice recognition software. Similar sounding words can inadvertently be transcribed and may not be corrected upon review.   Lawonda Pretlow  L Jaquelynn Wanamaker December 14, 2019, 2:02 PM  ADDENDUM: Hematology/Oncology Attending: I had a face-to-face encounter with the patient today.  I recommended her care plan.  This is a very pleasant 86 years old African-American female presented for evaluation of her anemia.  The patient underwent several studies to understand the underlying etiology of her anemia and they were unremarkable. Her anemia is likely anemia of chronic disease secondary to renal insufficiency. I discussed with the patient and her son the option of treatment with erythrocyte stimulating factor like Procrit or Aranesp versus continuous observation and monitoring.  The patient does not want to keep coming to the cancer center every few weeks for injection and she would like to continue on observation for now. I recommended for the patient to continue her routine follow-up visit by her primary care physician at this point and will be happy to see her in the future if needed. She was advised to call immediately if she has any other concerning symptoms in the interval.  Disclaimer: This note was dictated with voice recognition software. Similar sounding words can inadvertently be transcribed and may be missed upon review. Eilleen Kempf, MD 12/16/19

## 2019-12-15 ENCOUNTER — Telehealth: Payer: Self-pay

## 2019-12-15 NOTE — Telephone Encounter (Signed)
12/12/19:172/66 HR 77  12/13/19: AM : 160/62 - HR 64  12/14/19: AM:139/67 / PM:180/64   12/15/19: AM:156/62 - after taking BP medicine it was - 120/62

## 2019-12-16 ENCOUNTER — Encounter: Payer: Self-pay | Admitting: Physician Assistant

## 2019-12-18 NOTE — Telephone Encounter (Signed)
FYI, at home readings

## 2019-12-18 NOTE — Telephone Encounter (Signed)
Patient gave verbal understanding of recommendations per PCP listed below

## 2019-12-18 NOTE — Telephone Encounter (Signed)
I dont love the 172 reading but I also dont want to push blood pressure much lower with the 120 reading- lets continue current medicine and have her update me in another week

## 2019-12-20 ENCOUNTER — Encounter (HOSPITAL_COMMUNITY): Payer: Self-pay | Admitting: Emergency Medicine

## 2019-12-20 ENCOUNTER — Emergency Department (HOSPITAL_COMMUNITY)
Admission: EM | Admit: 2019-12-20 | Discharge: 2019-12-20 | Disposition: A | Discharge status: Home / Self Care | Payer: Medicare Other | Attending: Emergency Medicine | Admitting: Emergency Medicine

## 2019-12-20 ENCOUNTER — Emergency Department (HOSPITAL_COMMUNITY): Payer: Medicare Other

## 2019-12-20 DIAGNOSIS — Z91010 Allergy to peanuts: Secondary | ICD-10-CM | POA: Insufficient documentation

## 2019-12-20 DIAGNOSIS — E039 Hypothyroidism, unspecified: Secondary | ICD-10-CM | POA: Insufficient documentation

## 2019-12-20 DIAGNOSIS — R000 Tachycardia, unspecified: Secondary | ICD-10-CM | POA: Diagnosis not present

## 2019-12-20 DIAGNOSIS — Z8616 Personal history of COVID-19: Secondary | ICD-10-CM | POA: Insufficient documentation

## 2019-12-20 DIAGNOSIS — R0789 Other chest pain: Secondary | ICD-10-CM | POA: Diagnosis not present

## 2019-12-20 DIAGNOSIS — Z20822 Contact with and (suspected) exposure to covid-19: Secondary | ICD-10-CM | POA: Diagnosis not present

## 2019-12-20 DIAGNOSIS — I480 Paroxysmal atrial fibrillation: Secondary | ICD-10-CM | POA: Insufficient documentation

## 2019-12-20 DIAGNOSIS — Z7901 Long term (current) use of anticoagulants: Secondary | ICD-10-CM | POA: Diagnosis not present

## 2019-12-20 DIAGNOSIS — R053 Chronic cough: Secondary | ICD-10-CM | POA: Insufficient documentation

## 2019-12-20 DIAGNOSIS — I129 Hypertensive chronic kidney disease with stage 1 through stage 4 chronic kidney disease, or unspecified chronic kidney disease: Secondary | ICD-10-CM | POA: Diagnosis not present

## 2019-12-20 DIAGNOSIS — I639 Cerebral infarction, unspecified: Secondary | ICD-10-CM | POA: Diagnosis not present

## 2019-12-20 DIAGNOSIS — U071 COVID-19: Secondary | ICD-10-CM | POA: Diagnosis not present

## 2019-12-20 DIAGNOSIS — N183 Chronic kidney disease, stage 3 unspecified: Secondary | ICD-10-CM | POA: Diagnosis not present

## 2019-12-20 DIAGNOSIS — R509 Fever, unspecified: Secondary | ICD-10-CM | POA: Diagnosis present

## 2019-12-20 DIAGNOSIS — R079 Chest pain, unspecified: Secondary | ICD-10-CM | POA: Diagnosis not present

## 2019-12-20 DIAGNOSIS — I4891 Unspecified atrial fibrillation: Secondary | ICD-10-CM | POA: Diagnosis not present

## 2019-12-20 DIAGNOSIS — R0902 Hypoxemia: Secondary | ICD-10-CM | POA: Diagnosis not present

## 2019-12-20 LAB — CBC WITH DIFFERENTIAL/PLATELET
Abs Immature Granulocytes: 0.02 10*3/uL (ref 0.00–0.07)
Basophils Absolute: 0 10*3/uL (ref 0.0–0.1)
Basophils Relative: 0 %
Eosinophils Absolute: 0 10*3/uL (ref 0.0–0.5)
Eosinophils Relative: 1 %
HCT: 33.3 % — ABNORMAL LOW (ref 36.0–46.0)
Hemoglobin: 10.5 g/dL — ABNORMAL LOW (ref 12.0–15.0)
Immature Granulocytes: 1 %
Lymphocytes Relative: 24 %
Lymphs Abs: 0.8 10*3/uL (ref 0.7–4.0)
MCH: 27.5 pg (ref 26.0–34.0)
MCHC: 31.5 g/dL (ref 30.0–36.0)
MCV: 87.2 fL (ref 80.0–100.0)
Monocytes Absolute: 0.4 10*3/uL (ref 0.1–1.0)
Monocytes Relative: 12 %
Neutro Abs: 2 10*3/uL (ref 1.7–7.7)
Neutrophils Relative %: 62 %
Platelets: 200 10*3/uL (ref 150–400)
RBC: 3.82 MIL/uL — ABNORMAL LOW (ref 3.87–5.11)
RDW: 14 % (ref 11.5–15.5)
WBC: 3.2 10*3/uL — ABNORMAL LOW (ref 4.0–10.5)
nRBC: 0 % (ref 0.0–0.2)

## 2019-12-20 LAB — COMPREHENSIVE METABOLIC PANEL
ALT: 13 U/L (ref 0–44)
AST: 19 U/L (ref 15–41)
Albumin: 3.8 g/dL (ref 3.5–5.0)
Alkaline Phosphatase: 49 U/L (ref 38–126)
Anion gap: 10 (ref 5–15)
BUN: 25 mg/dL — ABNORMAL HIGH (ref 8–23)
CO2: 25 mmol/L (ref 22–32)
Calcium: 9.6 mg/dL (ref 8.9–10.3)
Chloride: 103 mmol/L (ref 98–111)
Creatinine, Ser: 1.65 mg/dL — ABNORMAL HIGH (ref 0.44–1.00)
GFR, Estimated: 29 mL/min — ABNORMAL LOW (ref >60)
Glucose, Bld: 102 mg/dL — ABNORMAL HIGH (ref 70–99)
Potassium: 3.4 mmol/L — ABNORMAL LOW (ref 3.5–5.1)
Sodium: 138 mmol/L (ref 135–145)
Total Bilirubin: 0.7 mg/dL (ref 0.3–1.2)
Total Protein: 6.9 g/dL (ref 6.5–8.1)

## 2019-12-20 LAB — RESPIRATORY PANEL BY RT PCR (FLU A&B, COVID)
Influenza A by PCR: NEGATIVE
Influenza B by PCR: NEGATIVE
SARS Coronavirus 2 by RT PCR: NEGATIVE

## 2019-12-20 LAB — I-STAT CHEM 8, ED
BUN: 26 mg/dL — ABNORMAL HIGH (ref 8–23)
Calcium, Ion: 1.19 mmol/L (ref 1.15–1.40)
Chloride: 101 mmol/L (ref 98–111)
Creatinine, Ser: 1.7 mg/dL — ABNORMAL HIGH (ref 0.44–1.00)
Glucose, Bld: 102 mg/dL — ABNORMAL HIGH (ref 70–99)
HCT: 31 % — ABNORMAL LOW (ref 36.0–46.0)
Hemoglobin: 10.5 g/dL — ABNORMAL LOW (ref 12.0–15.0)
Potassium: 3.5 mmol/L (ref 3.5–5.1)
Sodium: 139 mmol/L (ref 135–145)
TCO2: 25 mmol/L (ref 22–32)

## 2019-12-20 LAB — PROTIME-INR
INR: 1.1 (ref 0.8–1.2)
Prothrombin Time: 13.7 seconds (ref 11.4–15.2)

## 2019-12-20 LAB — TROPONIN I (HIGH SENSITIVITY)
Troponin I (High Sensitivity): 32 ng/L — ABNORMAL HIGH (ref <18)
Troponin I (High Sensitivity): 41 ng/L — ABNORMAL HIGH (ref <18)

## 2019-12-20 LAB — LACTIC ACID, PLASMA: Lactic Acid, Venous: 1.1 mmol/L (ref 0.5–1.9)

## 2019-12-20 LAB — BRAIN NATRIURETIC PEPTIDE: B Natriuretic Peptide: 234.8 pg/mL — ABNORMAL HIGH (ref 0.0–100.0)

## 2019-12-20 MED ORDER — SODIUM CHLORIDE 0.9 % IV BOLUS
250.0000 mL | Freq: Once | INTRAVENOUS | Status: AC
  Administered 2019-12-20: 250 mL via INTRAVENOUS

## 2019-12-20 MED ORDER — SODIUM CHLORIDE 0.9 % IV SOLN: Freq: Once | INTRAVENOUS | Status: AC

## 2019-12-20 MED ORDER — DILTIAZEM HCL-DEXTROSE 125-5 MG/125ML-% IV SOLN (PREMIX)
5.0000 mg/h | INTRAVENOUS | Status: DC
  Filled 2019-12-20: qty 125

## 2019-12-20 MED ORDER — OMEPRAZOLE 20 MG PO CPDR: 20.0000 mg | DELAYED_RELEASE_CAPSULE | Freq: Every day | ORAL | 1 refills | Status: DC

## 2019-12-20 MED ORDER — HYDRALAZINE HCL 25 MG PO TABS
25.0000 mg | ORAL_TABLET | Freq: Once | ORAL | Status: AC
  Administered 2019-12-20: 25 mg via ORAL
  Filled 2019-12-20: qty 1

## 2019-12-20 MED ORDER — DILTIAZEM LOAD VIA INFUSION
15.0000 mg | Freq: Once | INTRAVENOUS | Status: DC
  Filled 2019-12-20: qty 15

## 2019-12-20 MED ORDER — FAMOTIDINE 20 MG PO TABS
20.0000 mg | ORAL_TABLET | Freq: Once | ORAL | Status: AC
  Administered 2019-12-20: 20 mg via ORAL
  Filled 2019-12-20: qty 1

## 2019-12-20 NOTE — ED Provider Notes (Signed)
[facility] EMERGENCY DEPARTMENT Provider Note   CSN: [id number] Arrival date & time: 12/20/19  1132     History Chief Complaint  Patient presents with  . Cough    [name] is a 86 y.o. female.  HPI Patient reports that she chronically has a cough.  This is gone on for a number of years.  She reports sometimes she gets quite harsh coughing episodes.  This morning, she reports that she had an intense episode of coughing and felt her heart start to race.  She was able to measure her heart rate and it was in the 150s to 170s.  Patient reports she has history of intermittent atrial fibrillation.  She reports its been a number of years since she is actually had an episode.  She does take Eliquis and is compliant with the medication.  She reports that she did have some chest pain with the elevated heart rate at onset.  The chest pain has since resolved but she continues to feel her heart racing and skipping.  She reports typically this will resolve on its own within a few hours but it has now persisted throughout the morning and into the afternoon.  She has not had an episode of passing out.  She denies she has had fever chills or myalgias.  Patient had Covid in August.  She reports she has had a pretty harsh cough since that time.  She however has not had follow-up vaccination yet.    Past Medical History:  Diagnosis Date  . Arthritis   . Chicken pox   . Glaucoma of both eyes   . High cholesterol   . Hypertension    a. Normal renal arteries by duplex 2013.  [name] Hypothyroidism   . Migraine   . PAD (peripheral artery disease) ([place])    a. 04/2013: s/p PCI to occluded right popliteal artery   . PAF (paroxysmal atrial fibrillation) ([place])    a. s/p MAZE 2006. b. Event monitor 2013: NSR with PACs.  . Sinus bradycardia    a. 03/2011 during hospital admission - beta blocker stopped.  . Stroke [facility])    a. Pt reports 3-4 prior strokes without residual sx. b. CVA 2013  (presented with headache) - started on Plavix. Event monitor as outpt - no AF.    Patient Active Problem List   Diagnosis Date Noted  . Anemia of chronic disease 12/14/2019  . COVID-19 10/12/2019  . CKD (chronic kidney disease), stage III 06/15/2018  . Low vitamin B12 level 12/09/2017  . Anemia 08/16/2017  . Chronic cough 08/12/2015  . Perennial allergic rhinitis 08/12/2015  . GERD (gastroesophageal reflux disease) 06/25/2014  . Hyperglycemia 06/25/2014  . Vitamin D deficiency 06/25/2014  . Hypothyroidism 06/07/2014  . Hyperlipidemia 06/07/2014  . Intracervical pessary 06/07/2014  . Sinus bradycardia 05/19/2013  . PAD (peripheral artery disease) ([place]) 05/10/2013  . PAC (premature atrial contraction) 05/20/2011  . PAF (paroxysmal atrial fibrillation) ([place])   . History of stroke   . Hypertension     Past Surgical History:  Procedure Laterality Date  . CATARACT EXTRACTION Left 11/09/2017  . LOWER EXTREMITY ANGIOGRAM N/A 05/15/2013   Procedure: LOWER EXTREMITY ANGIOGRAM;  Surgeon: Lorretta Harp, MD;  Location: [facility] CATH LAB;  Service: Cardiovascular;  Laterality: N/A;  . MAZE  ~ 2006  . POPLITEAL ARTERY STENT  05/15/2013     OB History   No obstetric history on file.     Family History  Problem  Relation Age of Onset  . Hypertension Mother   . Ulcers Father   . Cervical cancer Sister   . Throat cancer Brother   . Lung cancer Brother   . Cancer Brother   . Allergic rhinitis Neg Hx   . Angioedema Neg Hx   . Asthma Neg Hx   . Atopy Neg Hx   . Eczema Neg Hx   . Immunodeficiency Neg Hx   . Urticaria Neg Hx     Social History   Tobacco Use  . Smoking status: Never Smoker  . Smokeless tobacco: Never Used  Vaping Use  . Vaping Use: Never used  Substance Use Topics  . Alcohol use: No    Alcohol/week: 0.0 standard drinks  . Drug use: No    Home Medications Prior to Admission medications   Medication Sig Start Date End Date Taking? Authorizing Provider    apixaban (ELIQUIS) 2.5 MG TABS tablet Take 1 tablet (2.5 mg total) by mouth 2 (two) times daily. 12/06/19   [name], MD  cephALEXin (KEFLEX) 250 MG capsule Take 1-2 capsules (250-500 mg total) by mouth every 8 (eight) hours. Take 2 tablets if tolerate every 8 hours. 1 tablet if do not tolerate but still every 8 hours 11/03/19   [name], MD  chlorthalidone (HYGROTON) 25 MG tablet Take 1 tablet (25 mg total) by mouth daily. 11/22/19   [name], MD  Digestive Enzyme CAPS Take 1 capsule by mouth See admin instructions. Mix the contents of 1 capsule into water and drink with all meals    [provider]  dorzolamide-timolol (COSOPT) 22.3-6.8 MG/ML ophthalmic solution Place 1 drop into the left eye in the morning and at bedtime.     [provider]  hydrALAZINE (APRESOLINE) 25 MG tablet Take 25 mg by mouth 3 (three) times daily.    [provider]  irbesartan (AVAPRO) 300 MG tablet Take 1 tablet (300 mg total) by mouth daily. 11/22/19   [name], MD  Multiple Vitamin (MULTIVITAMIN) LIQD Take 30 mLs by mouth daily.    [provider]  Nutritional Supplements (FEEDING SUPPLEMENT, KATE FARMS STANDARD 1.4,) LIQD liquid Take 325 mLs by mouth 3 (three) times daily between meals. 10/18/19   Elgergawy, Silver Huguenin, MD  Papaya TABS Take 3 tablets by mouth 3 (three) times daily after meals.    [provider]  Probiotic CAPS Take 1 capsule by mouth daily.    [provider]  thyroid (ARMOUR THYROID) 15 MG tablet Take 1 tablet (15 mg total) by mouth daily. 11/02/19   [name], MD    Allergies    Aceon [perindopril erbumine], Amlodipine, Aspirin, Azor [amlodipine-olmesartan], Beta adrenergic blockers, Hydralazine, Milk-related compounds, Other, Peanut-containing drug products, Sulfamethoxazole-trimethoprim, and Vitamin d analogs  Review of Systems   Review of Systems 10 systems reviewed and negative except as per HPI   physical Exam Updated Vital Signs BP 140/89 (BP Location: Right Arm)   Temp 98.7 F (37.1 C) (Oral)   Resp (!) 30   SpO2 100%   Physical Exam Constitutional:      Comments: Patient is alert and nontoxic.  No respiratory distress at rest.  Excellent physical condition for age.  HENT:     Head: Atraumatic.     Mouth/Throat:     Pharynx: Oropharynx is clear.  Eyes:     Extraocular Movements: Extraocular movements intact.  Cardiovascular:     Comments: Tachycardia.  Cannot appreciate rub murmur  gallop.  Rate however is significantly increased Pulmonary:     Effort: Pulmonary effort is normal.     Breath sounds: Normal breath sounds.  Abdominal:     General: There is no distension.     Palpations: Abdomen is soft.     Tenderness: There is no abdominal tenderness. There is no guarding.  Musculoskeletal:        General: No swelling or tenderness. Normal range of motion.     Right lower leg: No edema.     Left lower leg: No edema.  Skin:    General: Skin is warm and dry.  Neurological:     General: No focal deficit present.     Mental Status: She is oriented to person, place, and time.     Coordination: Coordination normal.  Psychiatric:        Mood and Affect: Mood normal.     ED Results / Procedures / Treatments   Labs (all labs ordered are listed, but only abnormal results are displayed) Labs Reviewed  CBC WITH DIFFERENTIAL/PLATELET - Abnormal; Notable for the following components:      Result Value   WBC 3.2 (*)    RBC 3.82 (*)    Hemoglobin 10.5 (*)    HCT 33.3 (*)    All other components within normal limits  I-STAT CHEM 8, ED - Abnormal; Notable for the following components:   BUN 26 (*)    Creatinine, Ser 1.70 (*)    Glucose, Bld 102 (*)    Hemoglobin 10.5 (*)    HCT 31.0 (*)    All other components within normal limits  RESPIRATORY PANEL BY RT PCR (FLU A&B, COVID)  PROTIME-INR  COMPREHENSIVE METABOLIC PANEL  BRAIN NATRIURETIC PEPTIDE  LACTIC ACID,  PLASMA  LACTIC ACID, PLASMA  URINALYSIS, ROUTINE W REFLEX MICROSCOPIC  TROPONIN I (HIGH SENSITIVITY)  TROPONIN I (HIGH SENSITIVITY)    EKG EKG Interpretation  Date/Time:  Wednesday December 20 2019 12:32:25 EDT Ventricular Rate:  68 PR Interval:    QRS Duration: 125 QT Interval:  407 QTC Calculation: 433 R Axis:   29 Text Interpretation: Sinus arrhythmia Right bundle branch block Abnrm T, consider ischemia, anterolateral lds rate now contraolled c/w previous Confirmed by Charlesetta Shanks [phone]) on 12/20/2019 1:26:10 PM   Radiology DG Chest Port 1 View  Result Date: 12/20/2019 CLINICAL DATA:  Atrial fibrillation, hypertension, prior stroke EXAM: PORTABLE CHEST 1 VIEW COMPARISON:  Portable exam 1235 hours compared 10/14/2019 FINDINGS: Normal heart size, mediastinal contours, and pulmonary vascularity. Atherosclerotic calcification aorta. Lungs clear. No acute infiltrate, pleural effusion or pneumothorax. Skin folds project over LEFT chest. Bones demineralized. IMPRESSION: No acute abnormalities. Electronically Signed   By: Lavonia Dana M.D.   On: 12/20/2019 12:50    Procedures Procedures (including critical care time)  Medications Ordered in ED Medications  diltiazem (CARDIZEM) 1 mg/mL load via infusion 15 mg (has no administration in time range)    And  diltiazem (CARDIZEM) 125 mg in dextrose 5% 125 mL (1 mg/mL) infusion (has no administration in time range)  0.9 %  sodium chloride infusion ( Intravenous New Bag/Given 12/20/19 1258)  sodium chloride 0.9 % bolus 250 mL (250 mLs Intravenous New Bag/Given 12/20/19 1258)    ED Course  I have reviewed the triage vital signs and the nursing notes.  Pertinent labs & imaging results that were available during my care of the patient were reviewed by me and considered in my medical decision making (see chart for details).  MDM Rules/Calculators/A&P                          Patient presents with atrial fibrillation RVR.  She does  have history of paroxysmal A. fib.  Patient's rates on the monitor were up to 170s.  She has however spontaneously converted prior to administration of ordered Cardizem.  At this time, will proceed with diagnostic evaluation.  We will continue to monitor for recurrence.  Patient is compliant with all of her medications.  Patient is anticoagulated.  After spontaneous conversion. Patient feels well. Rechecked at 1505. Second troponin processing. Will trend troponin for plateau. Anticipate some increase from demand. Patient is pain-free. She describes reflux type symptoms with frequent belching and persistent cough, will trial a course of omeprazole. If patient's heart rate mains stable and no significant troponin elevation, anticipate discharge home.  Dr. Tomi Bamberger to follow-up on repeat troponin. Final Clinical Impression(s) / ED Diagnoses Final diagnoses:  Paroxysmal atrial fibrillation with rapid ventricular response (HCC)  Chronic cough    Rx / DC Orders ED Discharge Orders    None       Charlesetta Shanks, MD 12/20/19 1506

## 2019-12-20 NOTE — ED Triage Notes (Signed)
Patient BIB [facility] for complaint of cough since August 2021. Patient states she has had the cough evaluated by multiple providers but states no one has been able to determine the cause of the cough. O2 saturation 100% on room air, denies any other complaints. Patient is alert and oriented and in no apparent distress.  History of afib, patient reports not taking her morning medications. Patient's current heart rate 140-170 and irregular, BP 140/89.

## 2019-12-20 NOTE — ED Notes (Signed)
Pt given "ER bagged lunch" and tea, per Dr. Johnney Killian.

## 2019-12-20 NOTE — ED Notes (Signed)
Ambulated to and from restroom with this RN without difficulty.

## 2019-12-20 NOTE — Discharge Instructions (Signed)
1. Schedule recheck with your doctor within the next week. 2. Take omeprazole daily. Take 30 minutes before you eat breakfast. 3. Continue all of your regularly prescribed medications.

## 2019-12-20 NOTE — ED Provider Notes (Signed)
Pt seen by Dr Johnney Killian. Please see her note.  PLan is for check on delta troponin.  If flat, anticipate discharge.  Delta trop is 41.  Slightly increased but similar to previous values.  Plan on discharge   Dorie Rank, MD 12/20/19 [phone]

## 2019-12-25 ENCOUNTER — Telehealth: Payer: Self-pay

## 2019-12-25 NOTE — Telephone Encounter (Signed)
Ok for next available slot?

## 2019-12-25 NOTE — Telephone Encounter (Signed)
Patient is calling in, was in in the ED on 12/20/19 and was told to follow up with Dr.Hunter in a week and no open slots or same day slots available this week.

## 2019-12-26 NOTE — Telephone Encounter (Signed)
1 PM today available

## 2019-12-26 NOTE — Telephone Encounter (Signed)
FYI

## 2019-12-26 NOTE — Telephone Encounter (Signed)
Patient has been scheduled

## 2019-12-26 NOTE — Telephone Encounter (Signed)
Lets work her in Thursday at 30- shorten CPE to 20 minutes at [phone]

## 2019-12-26 NOTE — Progress Notes (Signed)
Phone [phone] In person visit   Subjective:   [name] is a 86 y.o. year old very pleasant female patient who presents for/with See problem oriented charting Chief Complaint  Patient presents with  . Follow-up   This visit occurred during the SARS-CoV-2 public health emergency.  Safety protocols were in place, including screening questions prior to the visit, additional usage of staff PPE, and extensive cleaning of exam room while observing appropriate contact time as indicated for disinfecting solutions.   Past Medical History-  Patient Active Problem List   Diagnosis Date Noted  . COVID-19 10/12/2019    Priority: High  . Chronic cough 08/12/2015    Priority: High  . PAD (peripheral artery disease) ([place]) 05/10/2013    Priority: High  . PAF (paroxysmal atrial fibrillation) (HCC)     Priority: High  . History of stroke     Priority: High  . CKD (chronic kidney disease), stage III 06/15/2018    Priority: Medium  . Hyperglycemia 06/25/2014    Priority: Medium  . Hypothyroidism 06/07/2014    Priority: Medium  . Hyperlipidemia 06/07/2014    Priority: Medium  . Hypertension     Priority: Medium  . GERD (gastroesophageal reflux disease) 06/25/2014    Priority: Low  . Vitamin D deficiency 06/25/2014    Priority: Low  . Intracervical pessary 06/07/2014    Priority: Low  . Sinus bradycardia 05/19/2013    Priority: Low  . PAC (premature atrial contraction) 05/20/2011    Priority: Low  . Anemia of chronic disease 12/14/2019  . Low vitamin B12 level 12/09/2017  . Anemia 08/16/2017  . Perennial allergic rhinitis 08/12/2015    Medications- reviewed and updated Current Outpatient Medications  Medication Sig Dispense Refill  . apixaban (ELIQUIS) 2.5 MG TABS tablet Take 1 tablet (2.5 mg total) by mouth 2 (two) times daily. 180 tablet 3  . chlorthalidone (HYGROTON) 25 MG tablet Take 1 tablet (25 mg total) by mouth daily. 90 tablet 3  . Digestive Enzyme CAPS Take 1  capsule by mouth See admin instructions. Mix the contents of 1 capsule into water and drink with all meals    . dorzolamide-timolol (COSOPT) 22.3-6.8 MG/ML ophthalmic solution Place 1 drop into the left eye in the morning and at bedtime.     . hydrALAZINE (APRESOLINE) 25 MG tablet Take 1 tablet (25 mg total) by mouth 3 (three) times daily. 90 tablet 5  . irbesartan (AVAPRO) 300 MG tablet Take 1 tablet (300 mg total) by mouth daily. 90 tablet 3  . Multiple Vitamin (MULTIVITAMIN) LIQD Take 30 mLs by mouth daily.    . Nutritional Supplements (FEEDING SUPPLEMENT, KATE FARMS STANDARD 1.4,) LIQD liquid Take 325 mLs by mouth 3 (three) times daily between meals.    [name] omeprazole (PRILOSEC) 40 MG capsule Take 1 capsule (40 mg total) by mouth daily. 30 capsule 1  . Papaya TABS Take 3 tablets by mouth 3 (three) times daily.     . Probiotic CAPS Take 1 capsule by mouth daily.    [name] thyroid (ARMOUR THYROID) 15 MG tablet Take 1 tablet (15 mg total) by mouth daily. 90 tablet 1   No current facility-administered medications for this visit.     Objective:  BP 122/62   Pulse (!) 55   Temp 98 F (36.7 C) (Temporal)   Resp 18   Ht 5\' 3"  (1.6 m)   Wt 116 lb 3.2 oz (52.7 kg)   SpO2 98%   BMI 20.58 kg/m  Gen: NAD, resting comfortably CV: RRR no murmurs rubs or gallops Lungs: CTAB no crackles, wheeze, rhonchi  Ext: no edema Skin: warm, dry     Assessment and Plan  #ER follow up for atrial fibrillation with RVR #GERD S: Patient with an emergency room visit after she developed chest pain with heart rate up into the 170s due to atrial fibrillation with RVR.  Fortunately she spontaneously converted.  Troponin levels were stable when trended.  Some of the chest pain thought potentially related to reflux omeprazole 20 mg started in ER but has continued to have issues with some gagging/reflux issues- also belching and intermittent cough.  Some sneezing as well A/P: Patient appears to be back in sinus rhythm  at present.  I do not feel confident about starting rate control medicine with how low her heart rate is-has cardiology visit next week and I would like their opinion-they can also discuss blood pressure at that time as below.  Patient is appropriately anticoagulated with Eliquis 2.5 mg twice daily  For suspected GERD-has not had significant relief on 20 mg of omeprazole-increase to 40 mg and discussed if no improvement within a few weeks can refer to GI  #hypertension S: medication: chlorthalidone 25 mg in AM, hydralazine 25 mg in the morning and at night, irbesartan takes half in the morning and half at night.  Home readings #s: still getting up to 170 or so at night. Can range from 120s to 150s In AM. HR tends to run low BP Readings from Last 3 Encounters:  12/28/19 122/62  12/20/19 (!) 171/64  12/14/19 (!) 136/49  A/P: Blood pressure well controlled in office but she reports elevations in the evening time.  We will increase her hydralazine to 3 times a day with regimen as below  Continue chlorthalidone 25 mg in the morning Take hydralazine 25 mg at lunch, 25 mg mid afternoon, and 25 mg before bed (3x a day) Continue irbesartan half tablet of 300 mg in the morning  (150mg  total) and half tablet of 300mg  (150mg  total) before bed   #Hair loss-patient also reports hair loss rather diffusely-sounds like telogen effluvium after recent loss of husband to COVID-19/personal battle with COVID-19 -Consider thyroid recheck if ongoing issue Recommended follow up: Asked for 1 week update on blood pressures Future Appointments  Date Time Provider [place]  01/04/2020 11:45 AM Deberah Pelton, NP [facility]  03/14/2020  4:00 PM [name], MD [facility]    Lab/Order associations:   ICD-10-CM   1. Primary hypertension  I10   2. PAF (paroxysmal atrial fibrillation) (HCC)  I48.0   3. Gastroesophageal reflux disease, unspecified whether esophagitis present  K21.9     Meds  ordered this encounter  Medications  . omeprazole (PRILOSEC) 40 MG capsule    Sig: Take 1 capsule (40 mg total) by mouth daily.    Dispense:  30 capsule    Refill:  1  . hydrALAZINE (APRESOLINE) 25 MG tablet    Sig: Take 1 tablet (25 mg total) by mouth 3 (three) times daily.    Dispense:  90 tablet    Refill:  5   Return precautions advised.  Garret Reddish, MD

## 2019-12-26 NOTE — Telephone Encounter (Addendum)
Patient was not able to come in today.  States she had people working on her house today and that she did not have a ride.  States the only day she would be able to make an appt would be on a Thursday, when her son is off work.

## 2019-12-26 NOTE — Patient Instructions (Addendum)
Health Maintenance Due  Topic Date Due  . COVID-19 Vaccine (1) Has not received her vaccine.  Never done   Trial omeprazole 40mg  to see if that helps. If no improvement in 2-3 weeks let me know and we will take next step and refer to gastroenterology/stomach doctors  Continue chlorthalidone 25 mg in the morning Take hydralazine 25 mg at lunch, 25 mg mid afternoon, and 25 mg before bed (3x a day) Continue irbesartan half tablet of 300 mg in the morning  (150mg  total) and half tablet of 300mg  (150mg  total) before bed   Hesitant to restart blood pressure medicine that also helped with a fib due to low baseline HR under 60. Ask Dr. Berry/cardiology about the a fib if you should keep medicine on hand if heart rate gets high again- I dont think you can tolerate that regularly though  Update me in 1 week with blood pressures for morning, afternoon, and evening for at least 3 days prior before call and we will adjust as needed.

## 2019-12-28 ENCOUNTER — Other Ambulatory Visit: Payer: Self-pay | Admitting: Family Medicine

## 2019-12-28 ENCOUNTER — Other Ambulatory Visit: Payer: Self-pay

## 2019-12-28 ENCOUNTER — Encounter: Payer: Self-pay | Admitting: Family Medicine

## 2019-12-28 ENCOUNTER — Ambulatory Visit (INDEPENDENT_AMBULATORY_CARE_PROVIDER_SITE_OTHER): Payer: Medicare Other | Admitting: Family Medicine

## 2019-12-28 VITALS — BP 122/62 | HR 55 | Temp 98.0°F | Resp 18 | Ht 63.0 in | Wt 116.2 lb

## 2019-12-28 DIAGNOSIS — I10 Essential (primary) hypertension: Secondary | ICD-10-CM | POA: Diagnosis not present

## 2019-12-28 DIAGNOSIS — I480 Paroxysmal atrial fibrillation: Secondary | ICD-10-CM

## 2019-12-28 DIAGNOSIS — K219 Gastro-esophageal reflux disease without esophagitis: Secondary | ICD-10-CM

## 2019-12-28 MED ORDER — OMEPRAZOLE 40 MG PO CPDR
40.0000 mg | DELAYED_RELEASE_CAPSULE | Freq: Every day | ORAL | 1 refills | Status: DC
Start: 2019-12-28 — End: 2019-12-28

## 2019-12-28 MED ORDER — HYDRALAZINE HCL 25 MG PO TABS
25.0000 mg | ORAL_TABLET | Freq: Three times a day (TID) | ORAL | 5 refills | Status: DC
Start: 2019-12-28 — End: 2020-07-30

## 2020-01-03 NOTE — Progress Notes (Signed)
Cardiology Clinic Note   Patient Name: [name] Date of Encounter: 01/04/2020  Primary Care Provider:  [name], MD Primary Cardiologist:  Lauree Chandler, MD  Patient Profile    [name]. [name] 86 year old female presents to the clinic today for follow-up evaluation of her atrial fibrillation with RVR.  Past Medical History    Past Medical History:  Diagnosis Date   Arthritis    Chicken pox    Glaucoma of both eyes    High cholesterol    Hypertension    a. Normal renal arteries by duplex 2013.   Hypothyroidism    Migraine    PAD (peripheral artery disease) ([place])    a. 04/2013: s/p PCI to occluded right popliteal artery    PAF (paroxysmal atrial fibrillation) ([place])    a. s/p MAZE 2006. b. Event monitor 2013: NSR with PACs.   Sinus bradycardia    a. 03/2011 during hospital admission - beta blocker stopped.   Stroke [facility])    a. Pt reports 3-4 prior strokes without residual sx. b. CVA 2013 (presented with headache) - started on Plavix. Event monitor as outpt - no AF.   Past Surgical History:  Procedure Laterality Date   CATARACT EXTRACTION Left 11/09/2017   LOWER EXTREMITY ANGIOGRAM N/A 05/15/2013   Procedure: LOWER EXTREMITY ANGIOGRAM;  Surgeon: Lorretta Harp, MD;  Location: [facility] CATH LAB;  Service: Cardiovascular;  Laterality: N/A;   MAZE  ~ 2006   POPLITEAL ARTERY STENT  05/15/2013    Allergies  Allergies  Allergen Reactions   Aceon [Perindopril Erbumine] Cough   Amlodipine Other (See Comments)    Lower extremity discomfort   Aspirin Other (See Comments) and Cough    STOPPED BY A MEDICAL PROFESSIONAL   Azor [Amlodipine-Olmesartan] Cough   Beta Adrenergic Blockers Other (See Comments)    Bradycardia   Hydralazine Nausea And Vomiting and Other (See Comments)    Weakness- tolerating in 2021    Milk-Related Compounds Other (See Comments) and Cough    STOPPED BY A MEDICAL PROFESSIONAL   Other Other (See Comments) and  Cough    NUTS- ALL TYPES- (STOPPED BY A MEDICAL PROFESSIONAL) NO FOODS THAT CAUSE GAS (will cause A-Fib)   Peanut-Containing Drug Products Other (See Comments) and Cough    STOPPED BY A MEDICAL PROFESSIONAL   Sulfamethoxazole-Trimethoprim Other (See Comments)    Patient cannot specifically recall the reaction   Vitamin D Analogs Cough    History of Present Illness    Ms. [name] has a PMH of GERD, essential hypertension, PAD, sinus bradycardia, CKD stage III, CVA, HLD, anemia, COVID-19 infection, and paroxysmal atrial fibrillation.  She was recently seen in the emergency department on 12/20/2019.  She reported chest discomfort with a heart rate of 170.  Her high-sensitivity troponins were flat and stable.  She spontaneously converted back to sinus rhythm.  She followed up with her PCP 12/28/2019.  Patient was continued on apixaban 2.5 mg twice daily.  It was felt that some of her chest discomfort may be related to GERD and she was continued on omeprazole.  Rate control medications were not initiated at that time due to her pulse of 55.  Her blood pressure was well controlled with her chlorthalidone, hydralazine, and irbesartan.  She presents the clinic today for follow-up evaluation states she had another episode of atrial fibrillation last night.  She reports that when she eats foods such as broccoli, cabbage, and collard greens that produce more stomach gas that  will trigger her atrial fibrillation.  She states that she previously was on medication to help with rate control.  However, after her last hospital admission medication was discontinued.  Her heart rate today is 57 bpm.  She also indicates that she has been having intermittent periods of diarrhea.  She reports that she has been taking baking soda to help with her acid reflux.  Since increasing her omeprazole she has had less coughing and reflux symptoms.  She was educated about baking soda causing diarrhea and to reduce her  consumption.  She expressed understanding.  I will prescribe metoprolol tartrate 12.5 mg as needed for heart rate greater than 110.  She may take the medication up to twice per day.  I will give her the triggers for atrial fibrillation, have her continue her low-sodium diet, give her a blood pressure log, and have her follow-up with Dr. Angelena Form in 3 months.  Today she denies chest pain, shortness of breath, lower extremity edema, fatigue, palpitations, melena, hematuria, hemoptysis, diaphoresis, weakness, presyncope, syncope, orthopnea, and PND.   Home Medications    Prior to Admission medications   Medication Sig Start Date End Date Taking? Authorizing Provider  apixaban (ELIQUIS) 2.5 MG TABS tablet Take 1 tablet (2.5 mg total) by mouth 2 (two) times daily. 12/06/19   [name], MD  chlorthalidone (HYGROTON) 25 MG tablet Take 1 tablet (25 mg total) by mouth daily. 11/22/19   [name], MD  Digestive Enzyme CAPS Take 1 capsule by mouth See admin instructions. Mix the contents of 1 capsule into water and drink with all meals    [provider]  dorzolamide-timolol (COSOPT) 22.3-6.8 MG/ML ophthalmic solution Place 1 drop into the left eye in the morning and at bedtime.     [provider]  hydrALAZINE (APRESOLINE) 25 MG tablet Take 1 tablet (25 mg total) by mouth 3 (three) times daily. 12/28/19   [name], MD  irbesartan (AVAPRO) 300 MG tablet Take 1 tablet (300 mg total) by mouth daily. 11/22/19   [name], MD  Multiple Vitamin (MULTIVITAMIN) LIQD Take 30 mLs by mouth daily.    [provider]  Nutritional Supplements (FEEDING SUPPLEMENT, KATE FARMS STANDARD 1.4,) LIQD liquid Take 325 mLs by mouth 3 (three) times daily between meals. 10/18/19   Elgergawy, Silver Huguenin, MD  omeprazole (PRILOSEC) 40 MG capsule TAKE 1 CAPSULE(40 MG) BY MOUTH DAILY 12/28/19   [name], MD  Papaya TABS Take 3 tablets by mouth 3 (three) times daily.      [provider]  Probiotic CAPS Take 1 capsule by mouth daily.    [provider]  thyroid (ARMOUR THYROID) 15 MG tablet Take 1 tablet (15 mg total) by mouth daily. 11/02/19   [name], MD    Family History    Family History  Problem Relation Age of Onset   Hypertension Mother    Ulcers Father    Cervical cancer Sister    Throat cancer Brother    Lung cancer Brother    Cancer Brother    Allergic rhinitis Neg Hx    Angioedema Neg Hx    Asthma Neg Hx    Atopy Neg Hx    Eczema Neg Hx    Immunodeficiency Neg Hx    Urticaria Neg Hx    She indicated that her mother is deceased. She indicated that her father is deceased. She indicated that her sister is deceased. She indicated that only one  of her two brothers is alive. She indicated that her maternal grandmother is deceased. She indicated that her maternal grandfather is deceased. She indicated that her paternal grandmother is deceased. She indicated that her paternal grandfather is deceased. She indicated that the status of her neg hx is unknown.  Social History    Social History   Socioeconomic History   Marital status: Married    Spouse name: Not on file   Number of children: Not on file   Years of education: Not on file   Highest education level: Not on file  Occupational History   Not on file  Tobacco Use   Smoking status: Never Smoker   Smokeless tobacco: Never Used  Vaping Use   Vaping Use: Never used  Substance and Sexual Activity   Alcohol use: No    Alcohol/week: 0.0 standard drinks   Drug use: No   Sexual activity: Never  Other Topics Concern   Not on file  Social History Narrative   Married. Lives with husband. Married 65 years in 2016. [place] 22 years in 2016. 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 6 greatgrandkids.    Finished 10th grade.       Retired from working in [employer] for [employer]         Social  Determinants of Radio broadcast assistant Strain:    Difficulty of Paying Living Expenses: Not on file  Food Insecurity:    Worried About Charity fundraiser in the Last Year: Not on file   [employer] of Food in the Last Year: Not on file  Transportation Needs:    Film/video editor (Medical): Not on file   Lack of Transportation (Non-Medical): Not on file  Physical Activity:    Days of Exercise per Week: Not on file   Minutes of Exercise per Session: Not on file  Stress:    Feeling of Stress : Not on file  Social Connections:    Frequency of Communication with Friends and Family: Not on file   Frequency of Social Gatherings with Friends and Family: Not on file   Attends Religious Services: Not on file   Active Member of Clubs or Organizations: Not on file   Attends Archivist Meetings: Not on file   Marital Status: Not on file  Intimate Partner Violence:    Fear of Current or Ex-Partner: Not on file   Emotionally Abused: Not on file   Physically Abused: Not on file   Sexually Abused: Not on file     Review of Systems    General:  No chills, fever, night sweats or weight changes.  Cardiovascular:  No chest pain, dyspnea on exertion, edema, orthopnea, palpitations, paroxysmal nocturnal dyspnea. Dermatological: No rash, lesions/masses Respiratory: No cough, dyspnea Urologic: No hematuria, dysuria Abdominal:   No nausea, vomiting, diarrhea, bright red blood per rectum, melena, or hematemesis Neurologic:  No visual changes, wkns, changes in mental status. All other systems reviewed and are otherwise negative except as noted above.  Physical Exam    VS:  BP (!) 154/64    Pulse (!) 57    Ht 5\' 3"  (1.6 m)    Wt 117 lb 6.4 oz (53.3 kg)    BMI 20.80 kg/m  , BMI Body mass index is 20.8 kg/m. GEN: Well nourished, well developed, in no acute distress. HEENT: normal. Neck: Supple, no JVD, carotid bruits, or masses. Cardiac: RRR, no murmurs, rubs, or  gallops.  No clubbing, cyanosis, edema.  Radials/DP/PT 2+ and equal bilaterally.  Respiratory:  Respirations regular and unlabored, clear to auscultation bilaterally. GI: Soft, nontender, nondistended, BS + x 4. MS: no deformity or atrophy. Skin: warm and dry, no rash. Neuro:  Strength and sensation are intact. Psych: Normal affect.  Accessory Clinical Findings    Recent Labs: 10/16/2019: TSH 2.713 10/18/2019: Magnesium 2.7 12/20/2019: ALT 13; B Natriuretic Peptide 234.8; BUN 26; Creatinine, Ser 1.70; Hemoglobin 10.5; Platelets 200; Potassium 3.5; Sodium 139   Recent Lipid Panel    Component Value Date/Time   CHOL 225 (H) 12/06/2019 1500   TRIG 141 12/06/2019 1500   HDL 67 12/06/2019 1500   CHOLHDL 3.4 12/06/2019 1500   VLDL 22.0 07/28/2018 1423   LDLCALC 133 (H) 12/06/2019 1500    ECG personally reviewed by me today-sinus bradycardia right bundle branch block consider inferolateral ischemia 57 bpm- No acute changes  EKG 12/21/2019 Atrial fibrillation with RVR 166 bpm  Cardiac event monitor 12/05/2015 Sinus bradycardia, lowest heart rate 39 bpm Frequent premature ventricular contractions with post PVC pauses. Rare premature atrial contractions with several short (5 beat) runs of supraventricular tachycardia   Assessment & Plan   1.  Paroxysmal atrial fibrillation-presented to the emergency department 12/20/2019 with chest discomfort and was found to be in A. fib with RVR.  She spontaneously converted back to normal sinus rhythm.  It was felt that her chest discomfort may have also been a result of her GERD.  Denies bleeding issues. Continue apixaban (reduced dose age, creatinine) May use metoprolol tartrate 12.5 mg as needed for heart rate greater than 110 up to 2 times per day. Heart healthy low-sodium diet-salty 6 given Increase physical activity as tolerated Avoid triggers caffeine, chocolate, EtOH etc.  Essential hypertension-BP today 154/64.  Well-controlled at home  130/60's.  Continue hydralazine, irbesartan, chlorthalidone Heart healthy low-sodium diet-salty 6 given Increase physical activity as tolerated  Hyperlipidemia-12/06/2019: Cholesterol 225; HDL 67; LDL Cholesterol (Calc) 133; Triglycerides 141 Heart healthy low-sodium high-fiber diet Increase physical activity as tolerated Followed by PCP  Disposition: Follow-up with Dr. Angelena Form or APP in 3 months.  Jossie Ng. Jasmon Mattice NP-C    01/04/2020, 12:41 PM [place] [address] Office [phone] Fax [phone]  Notice: This dictation was prepared with Dragon dictation along with smaller phrase technology. Any transcriptional errors that result from this process are unintentional and may not be corrected upon review.

## 2020-01-04 ENCOUNTER — Ambulatory Visit (INDEPENDENT_AMBULATORY_CARE_PROVIDER_SITE_OTHER): Payer: Medicare Other | Admitting: General Practice

## 2020-01-04 ENCOUNTER — Other Ambulatory Visit: Payer: Self-pay

## 2020-01-04 ENCOUNTER — Encounter: Payer: Self-pay | Admitting: General Practice

## 2020-01-04 VITALS — BP 154/64 | HR 57 | Ht 63.0 in | Wt 117.4 lb

## 2020-01-04 DIAGNOSIS — I480 Paroxysmal atrial fibrillation: Secondary | ICD-10-CM

## 2020-01-04 DIAGNOSIS — E782 Mixed hyperlipidemia: Secondary | ICD-10-CM | POA: Diagnosis not present

## 2020-01-04 DIAGNOSIS — I10 Essential (primary) hypertension: Secondary | ICD-10-CM

## 2020-01-04 MED ORDER — METOPROLOL TARTRATE 25 MG PO TABS: 12.5000 mg | ORAL_TABLET | Freq: Every day | ORAL | 3 refills | Status: DC | PRN

## 2020-01-04 NOTE — Patient Instructions (Signed)
Medication Instructions:  TAKE METOPROLOL 12.5MG  FOR HEART-RATE >110 ONLY TAKE TWICE IN A DAY *If you need a refill on your cardiac medications before your next appointment, please call your pharmacy*  Lab Work:   Testing/Procedures:  NONE    NONE  Special Instructions Please try to avoid these triggers:  Do not use any products that have nicotine or tobacco in them. These include cigarettes, e-cigarettes, and chewing tobacco. If you need help quitting, ask your doctor.  Eat heart-healthy foods. Talk with your doctor about the right eating plan for you.  Exercise regularly as told by your doctor.  Do not drink alcohol, Caffeine or chocolate.  Lose weight if you are overweight.  Do not use drugs, including cannabis  CALL DR MCALHANY'S OFFICE IF METOPROLOL DOES NOT WORK ([phone])  DECREASE BAKING SODA USE  Follow-Up: Your next appointment:  3 month(s) In Person with You may see Lauree Chandler, MD or one of the following Advanced Practice Providers on your designated Care Team:  Melina Copa, PA-C  Ermalinda Barrios, PA-C    At [facility], you and your health needs are our priority.  As part of our continuing mission to provide you with exceptional heart care, we have created designated Provider Care Teams.  These Care Teams include your primary Cardiologist (physician) and Advanced Practice Providers (APPs -  Physician Assistants and Nurse Practitioners) who all work together to provide you with the care you need, when you need it.

## 2020-01-12 ENCOUNTER — Telehealth: Payer: Self-pay

## 2020-01-12 NOTE — Telephone Encounter (Signed)
See below

## 2020-01-12 NOTE — Telephone Encounter (Signed)
A few follow-up questions  Please confirm she is taking the following Continue chlorthalidone 25 mg in the morning Take hydralazine 25 mg at lunch, 25 mg mid afternoon, and 25 mg before bed (3x a day) Continue irbesartan half tablet of 300 mg in the morning  (150mg  total) and half tablet of 300mg  (150mg  total) before bed   When she saw cardiology approximately a week ago she was reporting blood pressures in the 130s over 60s-it looks like numbers are trending up consistently-want to make sure she is consistently taking above medications.  If she really was running 130s over 60s has anything change such as higher salt intake?  In regards to acid reflux-that her reflux resolved with the higher dose-if it did I would be okay with her going back to 20 mg

## 2020-01-12 NOTE — Telephone Encounter (Signed)
Pt's recent blood pressure readings  Pm 177/60 Am 152/49  PM 172/53 ... HR 41 Am 145/47 Pm 163/53    Pt also states the recent reflux medicine that was increased to 40 mg instead of 20mg  is giving her something close to diarrhea.

## 2020-01-15 NOTE — Telephone Encounter (Signed)
Lets push for low salt diet/healthy diet and monitor daily for next week and then send me an update in a week with all blood pressures

## 2020-01-15 NOTE — Telephone Encounter (Signed)
I called and spoke with pt confirming the below. Pt states nothing has changed, she is still taking her medicines as you have instructed. She states she may have eaten something with more salt that is affecting her numbers but they only tend to rise at night. She reduced the reflux med to 20Mg  last night also.

## 2020-01-16 NOTE — Telephone Encounter (Signed)
Called and spoke with pt and pt will update [place] next Thurs with bp numbers.

## 2020-01-17 DIAGNOSIS — H401131 Primary open-angle glaucoma, bilateral, mild stage: Secondary | ICD-10-CM | POA: Diagnosis not present

## 2020-03-04 ENCOUNTER — Encounter: Payer: Self-pay | Admitting: Family Medicine

## 2020-03-13 NOTE — Progress Notes (Signed)
Phone [phone] In person visit   Subjective:   [name] is a 87 y.o. year old very pleasant female patient who presents for/with See problem oriented charting Chief Complaint  Patient presents with  . Hypertension   This visit occurred during the SARS-CoV-2 public health emergency.  Safety protocols were in place, including screening questions prior to the visit, additional usage of staff PPE, and extensive cleaning of exam room while observing appropriate contact time as indicated for disinfecting solutions.   Past Medical History-  Patient Active Problem List   Diagnosis Date Noted  . COVID-19 10/12/2019    Priority: High  . Chronic cough 08/12/2015    Priority: High  . PAD (peripheral artery disease) ([place]) 05/10/2013    Priority: High  . PAF (paroxysmal atrial fibrillation) ([facility])     Priority: High  . History of stroke     Priority: High  . CKD (chronic kidney disease), stage III 06/15/2018    Priority: Medium  . Hyperglycemia 06/25/2014    Priority: Medium  . Hypothyroidism 06/07/2014    Priority: Medium  . Hyperlipidemia 06/07/2014    Priority: Medium  . Hypertension     Priority: Medium  . GERD (gastroesophageal reflux disease) 06/25/2014    Priority: Low  . Vitamin D deficiency 06/25/2014    Priority: Low  . Intracervical pessary 06/07/2014    Priority: Low  . Sinus bradycardia 05/19/2013    Priority: Low  . PAC (premature atrial contraction) 05/20/2011    Priority: Low  . Anemia of chronic disease 12/14/2019  . Low vitamin B12 level 12/09/2017  . Anemia 08/16/2017  . Perennial allergic rhinitis 08/12/2015    Medications- reviewed and updated Current Outpatient Medications  Medication Sig Dispense Refill  . apixaban (ELIQUIS) 2.5 MG TABS tablet Take 1 tablet (2.5 mg total) by mouth 2 (two) times daily. 180 tablet 3  . chlorthalidone (HYGROTON) 25 MG tablet Take 1 tablet (25 mg total) by mouth daily. 90 tablet 3  . Digestive Enzyme CAPS Take 1  capsule by mouth See admin instructions. Mix the contents of 1 capsule into water and drink with all meals    . dorzolamide-timolol (COSOPT) 22.3-6.8 MG/ML ophthalmic solution Place 1 drop into the left eye in the morning and at bedtime.     . hydrALAZINE (APRESOLINE) 25 MG tablet Take 1 tablet (25 mg total) by mouth 3 (three) times daily. 90 tablet 5  . irbesartan (AVAPRO) 300 MG tablet Take 1 tablet (300 mg total) by mouth daily. 90 tablet 3  . metoprolol tartrate (LOPRESSOR) 25 MG tablet Take 0.5 tablets (12.5 mg total) by mouth daily as needed (HR >110 take only BID). 180 tablet 3  . Multiple Vitamin (MULTIVITAMIN) LIQD Take 30 mLs by mouth daily.    . Nutritional Supplements (FEEDING SUPPLEMENT, KATE FARMS STANDARD 1.4,) LIQD liquid Take 325 mLs by mouth 3 (three) times daily between meals.    [name] omeprazole (PRILOSEC) 40 MG capsule TAKE 1 CAPSULE(40 MG) BY MOUTH DAILY 90 capsule 3  . Papaya TABS Take 3 tablets by mouth 3 (three) times daily.     . Probiotic CAPS Take 1 capsule by mouth daily.    [name] thyroid (ARMOUR THYROID) 15 MG tablet Take 1 tablet (15 mg total) by mouth daily. 90 tablet 1   No current facility-administered medications for this visit.     Objective:  BP (!) 140/56   Pulse (!) 50   Temp 98.2 F (36.8 C) (Temporal)   Ht  5\' 3"  (1.6 m)   Wt 117 lb 6.4 oz (53.3 kg)   SpO2 99%   BMI 20.80 kg/m  Gen: NAD, resting comfortably CV: RRR no murmurs rubs or gallops Lungs: CTAB no crackles, wheeze, rhonchi Ext: no edema Skin: warm, dry Neuro: grossly normal, moves all extremities      Assessment and Plan   #social update- very good family support- had 3 weeks with daughter in [place] and now son in town from [place].  Continues to grieve her loss of her husband in September from COVID-19.  Patient's family has expressed a strong opposition to patient receiving COVID-19 vaccination- I am certainly hoping in this case that she developed substantial antibodies-we did not  readdress this issue today  #hypertension S: medication:  Irbesartan 300Mg , hydralazine 25Mg  3x daily (some ankle burning), chlorthalidone 25Mg . Metoprolol only prn for elevated HR Home readings #s: variable still at home.  As low as 130/46 but can be as high as 770s or even 180s and even 190 on 1 occasion BP Readings from Last 3 Encounters:  03/14/20 (!) 140/56  01/04/20 (!) 154/64  12/28/19 122/62  A/P: Blood pressure only slightly elevated today but with home readings as high as 190.  Patient with multiple intolerances to medications as noted below HCTZ 12.5mg -burning with urination Amlodipine-burning in shins and edema Beta blocker-cannot tolerate due to brady Hydralazine 50 TID- dizziness Clonidine 0.1 mg- bradycardia Chlorthalidone-cough As a result we opted to refer to advanced hypertension clinic to see if they can help.  If she is declined from this clinic since she is not on 4 medications and her kidney function remains declined after COVID-19 hospitalization could consider nephrology as well-we are certainly hoping for improvement in renal function-check today  # cough S:developed cough 2-3 weeks ago. Got much better with strict diet with nutritionist in past but dont want her losing more weight.  She attributes this to her blood pressure medications Wt Readings from Last 3 Encounters:  03/14/20 117 lb 6.4 oz (53.3 kg)  01/04/20 117 lb 6.4 oz (53.3 kg)  12/28/19 116 lb 3.2 oz (52.7 kg)  A/P: I do not think we can reduce her blood pressure medications at present-we will continue for now- I also do not really want her losing weight like she did when she was on prior stricter diet- I think since there was improvement with diet this certainly could have a reflux element but patient is already on reasonable dose of medication omeprazole 40 mg-we will monitor  # Atrial fibrillation S: Rate controlled without meds other than family reunion took half tablet when HR got up (brought HR  back down) Anticoagulated with eliquis 2.5 mg  BID (has had some bloody vaginal discharge A/P: Appropriately anticoagulated and rate controlled-continue current medication.  Keep metoprolol as needed   # hair loss- hearing loss issues calming down after loss of husband- likely telogen effluvium after covid and dealing with loss of husband to covid.  We opted to check TSH today Lab Results  Component Value Date   TSH 2.713 10/16/2019   Recommended follow up: Return in about 3 months (around 06/12/2020) for follow up- or sooner if needed.  May stretch to 6 months if blood pressure is improved with cardiology help Future Appointments  Date Time Provider [place]  06/13/2020  3:00 PM [name], MD [facility]    Lab/Order associations:   ICD-10-CM   1. Primary hypertension  I10 CBC with Differential/Platelet    Comprehensive  metabolic panel    Ambulatory referral to Advanced Hypertension Clinic - [facility] [place]  2. Hair loss  L65.9 TSH  3. PAF (paroxysmal atrial fibrillation) (HCC)  I48.0   4. Chronic cough  R05.3    Return precautions advised.  Garret Reddish, MD

## 2020-03-13 NOTE — Patient Instructions (Addendum)
Please stop by lab before you go If you have mychart- we will send your results within 3 business days of [place] receiving them.  If you do not have mychart- we will call you about results within 5 business days of [place] receiving them.  *please also note that you will see labs on mychart as soon as they post. I will later go in and write notes on them- will say "notes from Dr. Yong Channel"  We will call you within two weeks about your referral to Dr. Oval Linsey of [facility] cardiology hypertension clinic. If you do not hear within 2 weeks, give [place] a call.   Recommended follow up: Return in about 3 months (around 06/12/2020) for follow up- or sooner if needed. Or if blood pressure better with cardiology- ok to stretch to 6 months

## 2020-03-14 ENCOUNTER — Encounter: Payer: Self-pay | Admitting: Family Medicine

## 2020-03-14 ENCOUNTER — Ambulatory Visit (INDEPENDENT_AMBULATORY_CARE_PROVIDER_SITE_OTHER): Payer: Medicare Other | Admitting: Family Medicine

## 2020-03-14 ENCOUNTER — Other Ambulatory Visit: Payer: Self-pay

## 2020-03-14 VITALS — BP 140/56 | HR 50 | Temp 98.2°F | Ht 63.0 in | Wt 117.4 lb

## 2020-03-14 DIAGNOSIS — I480 Paroxysmal atrial fibrillation: Secondary | ICD-10-CM

## 2020-03-14 DIAGNOSIS — I10 Essential (primary) hypertension: Secondary | ICD-10-CM | POA: Diagnosis not present

## 2020-03-14 DIAGNOSIS — R053 Chronic cough: Secondary | ICD-10-CM

## 2020-03-14 DIAGNOSIS — L659 Nonscarring hair loss, unspecified: Secondary | ICD-10-CM | POA: Diagnosis not present

## 2020-03-14 NOTE — Assessment & Plan Note (Signed)
S: medication:  Irbesartan 300Mg , hydralazine 25Mg  3x daily (some ankle burning), chlorthalidone 25Mg . Metoprolol only prn for elevated HR Home readings #s: variable still at home.  As low as 130/46 but can be as high as 770s or even 180s and even 190 on 1 occasion BP Readings from Last 3 Encounters:  03/14/20 (!) 140/56  01/04/20 (!) 154/64  12/28/19 122/62  A/P: Blood pressure only slightly elevated today but with home readings as high as 190.  Patient with multiple intolerances to medications as noted below HCTZ 12.5mg -burning with urination Amlodipine-burning in shins and edema Beta blocker-cannot tolerate due to brady Hydralazine 50 TID- dizziness Clonidine 0.1 mg- bradycardia Chlorthalidone-cough As a result we opted to refer to advanced hypertension clinic to see if they can help.  If she is declined from this clinic since she is not on 4 medications and her kidney function remains declined after COVID-19 hospitalization could consider nephrology as well-we are certainly hoping for improvement in renal function-check today

## 2020-03-15 LAB — CBC WITH DIFFERENTIAL/PLATELET
Basophils Absolute: 0 10*3/uL (ref 0.0–0.1)
Basophils Relative: 1.3 % (ref 0.0–3.0)
Eosinophils Absolute: 0 10*3/uL (ref 0.0–0.7)
Eosinophils Relative: 1.7 % (ref 0.0–5.0)
HCT: 30.8 % — ABNORMAL LOW (ref 36.0–46.0)
Hemoglobin: 10.1 g/dL — ABNORMAL LOW (ref 12.0–15.0)
Lymphocytes Relative: 28.5 % (ref 12.0–46.0)
Lymphs Abs: 0.8 10*3/uL (ref 0.7–4.0)
MCHC: 32.8 g/dL (ref 30.0–36.0)
MCV: 81.8 fl (ref 78.0–100.0)
Monocytes Absolute: 0.5 10*3/uL (ref 0.1–1.0)
Monocytes Relative: 17.9 % — ABNORMAL HIGH (ref 3.0–12.0)
Neutro Abs: 1.4 10*3/uL (ref 1.4–7.7)
Neutrophils Relative %: 50.6 % (ref 43.0–77.0)
Platelets: 152 10*3/uL (ref 150.0–400.0)
RBC: 3.77 Mil/uL — ABNORMAL LOW (ref 3.87–5.11)
RDW: 14.6 % (ref 11.5–15.5)
WBC: 2.7 10*3/uL — ABNORMAL LOW (ref 4.0–10.5)

## 2020-03-15 LAB — COMPREHENSIVE METABOLIC PANEL
ALT: 11 U/L (ref 0–35)
AST: 16 U/L (ref 0–37)
Albumin: 4.3 g/dL (ref 3.5–5.2)
Alkaline Phosphatase: 58 U/L (ref 39–117)
BUN: 39 mg/dL — ABNORMAL HIGH (ref 6–23)
CO2: 28 mEq/L (ref 19–32)
Calcium: 9.2 mg/dL (ref 8.4–10.5)
Chloride: 103 mEq/L (ref 96–112)
Creatinine, Ser: 1.66 mg/dL — ABNORMAL HIGH (ref 0.40–1.20)
GFR: 26.68 mL/min — ABNORMAL LOW (ref >60.00)
Glucose, Bld: 83 mg/dL (ref 70–99)
Potassium: 4.1 mEq/L (ref 3.5–5.1)
Sodium: 137 mEq/L (ref 135–145)
Total Bilirubin: 0.4 mg/dL (ref 0.2–1.2)
Total Protein: 7 g/dL (ref 6.0–8.3)

## 2020-03-15 LAB — TSH: TSH: 4.16 u[IU]/mL (ref 0.35–4.50)

## 2020-03-21 DIAGNOSIS — N8189 Other female genital prolapse: Secondary | ICD-10-CM | POA: Diagnosis not present

## 2020-03-21 DIAGNOSIS — N898 Other specified noninflammatory disorders of vagina: Secondary | ICD-10-CM | POA: Diagnosis not present

## 2020-04-10 ENCOUNTER — Telehealth: Payer: Self-pay | Admitting: Family Medicine

## 2020-04-10 NOTE — Telephone Encounter (Signed)
Left message for patient to call back and schedule Medicare Annual Wellness Visit (AWV) either virtually OR in office.   Last AWV 02/21/19; please schedule at anytime with [facility]Nurse Health Advisor at [facility].  This should be a 45 minute visit.

## 2020-04-22 ENCOUNTER — Other Ambulatory Visit: Payer: Self-pay | Admitting: Family Medicine

## 2020-05-10 ENCOUNTER — Ambulatory Visit (INDEPENDENT_AMBULATORY_CARE_PROVIDER_SITE_OTHER): Payer: Medicare Other | Admitting: Cardiovascular Disease

## 2020-05-10 ENCOUNTER — Encounter: Payer: Self-pay | Admitting: Cardiovascular Disease

## 2020-05-10 ENCOUNTER — Other Ambulatory Visit: Payer: Self-pay

## 2020-05-10 VITALS — BP 198/78 | HR 62 | Ht 63.0 in | Wt 120.0 lb

## 2020-05-10 DIAGNOSIS — R001 Bradycardia, unspecified: Secondary | ICD-10-CM

## 2020-05-10 DIAGNOSIS — I739 Peripheral vascular disease, unspecified: Secondary | ICD-10-CM | POA: Diagnosis not present

## 2020-05-10 DIAGNOSIS — I10 Essential (primary) hypertension: Secondary | ICD-10-CM | POA: Diagnosis not present

## 2020-05-10 DIAGNOSIS — I480 Paroxysmal atrial fibrillation: Secondary | ICD-10-CM

## 2020-05-10 MED ORDER — DOXAZOSIN MESYLATE 2 MG PO TABS: 2.0000 mg | ORAL_TABLET | Freq: Every day | ORAL | 3 refills | Status: DC

## 2020-05-10 NOTE — Patient Instructions (Addendum)
Medication Instructions:  START DOXAZOSIN 2 MG IN THE EVENING    Labwork: NONE   Testing/Procedures: Your physician has requested that you have a renal artery duplex. During this test, an ultrasound is used to evaluate blood flow to the kidneys. Allow one hour for this exam. Do not eat after midnight the day before and avoid carbonated beverages. Take your medications as you usually do.   Follow-Up: 06/11/2020 at 1:30 pm with Pharm D    Special Instructions:   MONITOR YOUR BLOOD PRESSURE TWICE A DAY, LOG IN THE BOOK PROVIDED. BRING THE BOOK AND YOUR BLOOD PRESSURE MACHINE TO YOUR FOLLOW UP IN 1 MONTH   DASH Eating Plan DASH stands for "Dietary Approaches to Stop Hypertension." The DASH eating plan is a healthy eating plan that has been shown to reduce high blood pressure (hypertension). It may also reduce your risk for type 2 diabetes, heart disease, and stroke. The DASH eating plan may also help with weight loss. What are tips for following this plan?  General guidelines  Avoid eating more than 2,300 mg (milligrams) of salt (sodium) a day. If you have hypertension, you may need to reduce your sodium intake to 1,500 mg a day.  Limit alcohol intake to no more than 1 drink a day for nonpregnant women and 2 drinks a day for men. One drink equals 12 oz of beer, 5 oz of wine, or 1 oz of hard liquor.  Work with your health care provider to maintain a healthy body weight or to lose weight. Ask what an ideal weight is for you.  Get at least 30 minutes of exercise that causes your heart to beat faster (aerobic exercise) most days of the week. Activities may include walking, swimming, or biking.  Work with your health care provider or diet and nutrition specialist (dietitian) to adjust your eating plan to your individual calorie needs. Reading food labels   Check food labels for the amount of sodium per serving. Choose foods with less than 5 percent of the Daily Value of sodium. Generally,  foods with less than 300 mg of sodium per serving fit into this eating plan.  To find whole grains, look for the word "whole" as the first word in the ingredient list. Shopping  Buy products labeled as "low-sodium" or "no salt added."  Buy fresh foods. Avoid canned foods and premade or frozen meals. Cooking  Avoid adding salt when cooking. Use salt-free seasonings or herbs instead of table salt or sea salt. Check with your health care provider or pharmacist before using salt substitutes.  Do not fry foods. Cook foods using healthy methods such as baking, boiling, grilling, and broiling instead.  Cook with heart-healthy oils, such as olive, canola, soybean, or sunflower oil. Meal planning  Eat a balanced diet that includes: ? 5 or more servings of fruits and vegetables each day. At each meal, try to fill half of your plate with fruits and vegetables. ? Up to 6-8 servings of whole grains each day. ? Less than 6 oz of lean meat, poultry, or fish each day. A 3-oz serving of meat is about the same size as a deck of cards. One egg equals 1 oz. ? 2 servings of low-fat dairy each day. ? A serving of nuts, seeds, or beans 5 times each week. ? Heart-healthy fats. Healthy fats called Omega-3 fatty acids are found in foods such as flaxseeds and coldwater fish, like sardines, salmon, and mackerel.  Limit how much you eat of  the following: ? Canned or prepackaged foods. ? Food that is high in trans fat, such as fried foods. ? Food that is high in saturated fat, such as fatty meat. ? Sweets, desserts, sugary drinks, and other foods with added sugar. ? Full-fat dairy products.  Do not salt foods before eating.  Try to eat at least 2 vegetarian meals each week.  Eat more home-cooked food and less restaurant, buffet, and fast food.  When eating at a restaurant, ask that your food be prepared with less salt or no salt, if possible. What foods are recommended? The items listed may not be a  complete list. Talk with your dietitian about what dietary choices are best for you. Grains Whole-grain or whole-wheat bread. Whole-grain or whole-wheat pasta. Emanuelle Bastos rice. Modena Morrow. Bulgur. Whole-grain and low-sodium cereals. Pita bread. Low-fat, low-sodium crackers. Whole-wheat flour tortillas. Vegetables Fresh or frozen vegetables (raw, steamed, roasted, or grilled). Low-sodium or reduced-sodium tomato and vegetable juice. Low-sodium or reduced-sodium tomato sauce and tomato paste. Low-sodium or reduced-sodium canned vegetables. Fruits All fresh, dried, or frozen fruit. Canned fruit in natural juice (without added sugar). Meat and other protein foods Skinless chicken or [place]. Ground chicken or [place]. Pork with fat trimmed off. Fish and seafood. Egg whites. Dried beans, peas, or lentils. Unsalted nuts, nut butters, and seeds. Unsalted canned beans. Lean cuts of beef with fat trimmed off. Low-sodium, lean deli meat. Dairy Low-fat (1%) or fat-free (skim) milk. Fat-free, low-fat, or reduced-fat cheeses. Nonfat, low-sodium ricotta or cottage cheese. Low-fat or nonfat yogurt. Low-fat, low-sodium cheese. Fats and oils Soft margarine without trans fats. Vegetable oil. Low-fat, reduced-fat, or light mayonnaise and salad dressings (reduced-sodium). Canola, safflower, olive, soybean, and sunflower oils. Avocado. Seasoning and other foods Herbs. Spices. Seasoning mixes without salt. Unsalted popcorn and pretzels. Fat-free sweets. What foods are not recommended? The items listed may not be a complete list. Talk with your dietitian about what dietary choices are best for you. Grains Baked goods made with fat, such as croissants, muffins, or some breads. Dry pasta or rice meal packs. Vegetables Creamed or fried vegetables. Vegetables in a cheese sauce. Regular canned vegetables (not low-sodium or reduced-sodium). Regular canned tomato sauce and paste (not low-sodium or reduced-sodium). Regular  tomato and vegetable juice (not low-sodium or reduced-sodium). Angie Fava. Olives. Fruits Canned fruit in a light or heavy syrup. Fried fruit. Fruit in cream or butter sauce. Meat and other protein foods Fatty cuts of meat. Ribs. Fried meat. Berniece Salines. Sausage. Bologna and other processed lunch meats. Salami. Fatback. Hotdogs. Bratwurst. Salted nuts and seeds. Canned beans with added salt. Canned or smoked fish. Whole eggs or egg yolks. Chicken or [place] with skin. Dairy Whole or 2% milk, cream, and half-and-half. Whole or full-fat cream cheese. Whole-fat or sweetened yogurt. Full-fat cheese. Nondairy creamers. Whipped toppings. Processed cheese and cheese spreads. Fats and oils Butter. Stick margarine. Lard. Shortening. Ghee. Bacon fat. Tropical oils, such as coconut, palm kernel, or palm oil. Seasoning and other foods Salted popcorn and pretzels. Onion salt, garlic salt, seasoned salt, table salt, and sea salt. Worcestershire sauce. Tartar sauce. Barbecue sauce. Teriyaki sauce. Soy sauce, including reduced-sodium. Steak sauce. Canned and packaged gravies. Fish sauce. Oyster sauce. Cocktail sauce. Horseradish that you find on the shelf. Ketchup. Mustard. Meat flavorings and tenderizers. Bouillon cubes. Hot sauce and Tabasco sauce. Premade or packaged marinades. Premade or packaged taco seasonings. Relishes. Regular salad dressings. Where to find more information:  National Heart, Lung, and [place]: https://wilson-eaton.com/  American Heart Association:  www.heart.org Summary  The DASH eating plan is a healthy eating plan that has been shown to reduce high blood pressure (hypertension). It may also reduce your risk for type 2 diabetes, heart disease, and stroke.  With the DASH eating plan, you should limit salt (sodium) intake to 2,300 mg a day. If you have hypertension, you may need to reduce your sodium intake to 1,500 mg a day.  When on the DASH eating plan, aim to eat more fresh fruits and  vegetables, whole grains, lean proteins, low-fat dairy, and heart-healthy fats.  Work with your health care provider or diet and nutrition specialist (dietitian) to adjust your eating plan to your individual calorie needs. This information is not intended to replace advice given to you by your health care provider. Make sure you discuss any questions you have with your health care provider. Document Released: 01/29/2011 Document Revised: 01/22/2017 Document Reviewed: 02/03/2016 Elsevier Patient Education  2020 [employer].

## 2020-05-10 NOTE — Progress Notes (Signed)
Advanced Hypertension Clinic Initial Assessment:    Date:  05/10/2020   ID:  SPENSER CONG, DOB [birth year], MRN [id number]  PCP:  [name], MD  Cardiologist:  Lauree Chandler, MD  Nephrologist:  Referring MD: [name], MD   CC: Hypertension  History of Present Illness:    MADDALYNN BARNARD is a 87 y.o. female with a hx of paroxysmal atrial fibrillation, hypertension, hyperlipidemia, prior stroke, and CKD 3 here to establish care in the hypertension clinic.  She was first diagnosed with hypertension in her 3s.  Initially it was more easy to control but has been more difficult lately.  When she checks her blood pressure at home it is mostly in the 120s to 170s.  On average it is in the 150s or higher.  She last saw Dr. Yong Channel 02/2020.  At that time her blood pressure was 140/56, down 154/60 for the prior visit.  At the time she was taking irbesartan 300 mg, hydralazine 25 mg 3 times daily and chlorthalidone 25 mg daily.  She is metoprolol as needed for elevated heart rates and atrial fibrillation.  She last needed to use this in November.  She had some dizziness when hydralazine was increased to 50 mg.  Lately she has been under a lot of stress.  Her husband of 77 years passed away in December 02, 2022 from COVID-19.  They were both hospitalized at the same time.  She required in-home physical therapy but is now back to being independent.  She struggles with being very lonely.  She does have a lot of family members who check on her regularly.  Lately she has not been getting much exercise.  She visited her daughter in [place] a week ago.  Prior to that she was riding her exercise bike 3 days/week and doing her physical therapy exercises.  She feels good with exercise and her breathing is okay but she just had them and motivated.  She has some swelling in her ankles at night that gets better with elevation.  She has no orthopnea or PND.  Lately she is not doing much cooking because she  does not like to cook for herself.  She does not add any salt to food and does not have any caffeine intake.  She takes her medications at 10 AM, 5 PM, and 11 PM.  Ms. Crocker saw cardiology in the hospital in 2013 for bradycardia.  At that time her blood pressure was poorly controlled and she had recently been started on Bystolic but her heart rate dropped to the 30s and 40s.  It was recommended that Bystolic be discontinued.  She had renal artery Dopplers in 2013 that were normal.  Previous antihypertensives: nebivolol-bradycardia HCTZ-dysuria Amlodipine- edema Hydralazine-dizziness Clonidine-bradycardia Chlorthalidone-cough   Past Medical History:  Diagnosis Date  . Arthritis   . Chicken pox   . Glaucoma of both eyes   . High cholesterol   . Hypertension    a. Normal renal arteries by duplex 2013.  [name] Hypothyroidism   . Migraine   . PAD (peripheral artery disease) ([place])    a. 04/2013: s/p PCI to occluded right popliteal artery   . PAF (paroxysmal atrial fibrillation) ([place])    a. s/p MAZE 2006. b. Event monitor 2013: NSR with PACs.  . Sinus bradycardia    a. 03/2011 during hospital admission - beta blocker stopped.  . Stroke [facility])    a. Pt reports 3-4 prior strokes without residual sx. b. CVA 2013 (presented  with headache) - started on Plavix. Event monitor as outpt - no AF.    Past Surgical History:  Procedure Laterality Date  . CATARACT EXTRACTION Left 11/09/2017  . LOWER EXTREMITY ANGIOGRAM N/A 05/15/2013   Procedure: LOWER EXTREMITY ANGIOGRAM;  Surgeon: Lorretta Harp, MD;  Location: [facility] CATH LAB;  Service: Cardiovascular;  Laterality: N/A;  . MAZE  ~ 2006  . POPLITEAL ARTERY STENT  05/15/2013    Current Medications: Current Meds  Medication Sig  . apixaban (ELIQUIS) 2.5 MG TABS tablet Take 1 tablet (2.5 mg total) by mouth 2 (two) times daily.  . chlorthalidone (HYGROTON) 25 MG tablet Take 1 tablet (25 mg total) by mouth daily.  . Digestive Enzyme CAPS Take 1 capsule  by mouth See admin instructions. Mix the contents of 1 capsule into water and drink with all meals  . dorzolamide-timolol (COSOPT) 22.3-6.8 MG/ML ophthalmic solution Place 1 drop into the left eye in the morning and at bedtime.   [name] doxazosin (CARDURA) 2 MG tablet Take 1 tablet (2 mg total) by mouth at bedtime.  . hydrALAZINE (APRESOLINE) 25 MG tablet Take 1 tablet (25 mg total) by mouth 3 (three) times daily.  . irbesartan (AVAPRO) 300 MG tablet Take 1 tablet (300 mg total) by mouth daily.  . metoprolol tartrate (LOPRESSOR) 25 MG tablet Take 0.5 tablets (12.5 mg total) by mouth daily as needed (HR >110 take only BID).  . Multiple Vitamin (MULTIVITAMIN) LIQD Take 30 mLs by mouth daily.  . Nutritional Supplements (FEEDING SUPPLEMENT, KATE FARMS STANDARD 1.4,) LIQD liquid Take 325 mLs by mouth 3 (three) times daily between meals.  [name] omeprazole (PRILOSEC) 40 MG capsule TAKE 1 CAPSULE(40 MG) BY MOUTH DAILY  . Papaya TABS Take 3 tablets by mouth 3 (three) times daily.   . Probiotic CAPS Take 1 capsule by mouth daily.  [name] thyroid (ARMOUR THYROID) 15 MG tablet Take 1 tablet (15 mg total) by mouth daily.     Allergies:   Aceon [perindopril erbumine], Amlodipine, Aspirin, Azor [amlodipine-olmesartan], Beta adrenergic blockers, Hydralazine, Milk-related compounds, Other, Peanut-containing drug products, Sulfamethoxazole-trimethoprim, and Vitamin d analogs   Social History   Socioeconomic History  . Marital status: Married    Spouse name: Not on file  . Number of children: Not on file  . Years of education: Not on file  . Highest education level: Not on file  Occupational History  . Not on file  Tobacco Use  . Smoking status: Never Smoker  . Smokeless tobacco: Never Used  Vaping Use  . Vaping Use: Never used  Substance and Sexual Activity  . Alcohol use: No    Alcohol/week: 0.0 standard drinks  . Drug use: No  . Sexual activity: Never  Other Topics Concern  . Not on file  Social History  Narrative   Married. Lives with husband. Married 65 years in 2016. Chadron 22 years in 2016. 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 6 greatgrandkids.    Finished 10th grade.       Retired from working in [employer] for [employer]         Social Determinants of Radio broadcast assistant Strain: Not on [employer] Insecurity: Not on file  Transportation Needs: Not on file  Physical Activity: Not on file  Stress: Not on file  Social Connections: Not on file     Family History: The patient's family history includes Cancer in her brother; Cervical cancer in her sister; Hypertension in  her mother; Lung cancer in her brother; Throat cancer in her brother; Ulcers in her father. There is no history of Allergic rhinitis, Angioedema, Asthma, Atopy, Eczema, Immunodeficiency, or Urticaria.  ROS:   Please see the history of present illness.     All other systems reviewed and are negative.  EKGs/Labs/Other Studies Reviewed:    EKG:  EKG is ordered today.  The ekg ordered today demonstrates Sinus rhythm.  PACs.  Rate 62 bpm.  Right bundle branch block.  Inferior T wave inversions.  Recent Labs: 10/18/2019: Magnesium 2.7 12/20/2019: B Natriuretic Peptide 234.8 03/14/2020: ALT 11; BUN 39; Creatinine, Ser 1.66; Hemoglobin 10.1; Platelets 152.0; Potassium 4.1; Sodium 137; TSH 4.16   Recent Lipid Panel    Component Value Date/Time   CHOL 225 (H) 12/06/2019 1500   TRIG 141 12/06/2019 1500   HDL 67 12/06/2019 1500   CHOLHDL 3.4 12/06/2019 1500   VLDL 22.0 07/28/2018 1423   LDLCALC 133 (H) 12/06/2019 1500    Physical Exam:   VS:  BP (!) 198/78 (BP Location: Right Arm, Patient Position: Sitting, Cuff Size: Normal)   Pulse 62   Ht 5\' 3"  (1.6 m)   Wt 120 lb (54.4 kg)   BMI 21.26 kg/m  , BMI Body mass index is 21.26 kg/m. GENERAL:  Well appearing HEENT: Pupils equal round and reactive, fundi not visualized, oral mucosa unremarkable NECK:  No jugular venous  distention, waveform within normal limits, carotid upstroke brisk and symmetric, no bruits LUNGS:  Clear to auscultation bilaterally HEART:  RRR.  PMI not displaced or sustained,S1 and S2 within normal limits, no S3, no S4, no clicks, no rubs, no murmurs ABD:  Flat, positive bowel sounds normal in frequency in pitch, no bruits, no rebound, no guarding, no midline pulsatile mass, no hepatomegaly, no splenomegaly EXT:  2 plus pulses throughout, no edema, no cyanosis no clubbing SKIN:  No rashes no nodules NEURO:  Cranial nerves II through XII grossly intact, motor grossly intact throughout PSYCH:  Cognitively intact, oriented to person place and time   ASSESSMENT:    1. Essential hypertension   2. PAF (paroxysmal atrial fibrillation) ([place])   3. PAD (peripheral artery disease) ([place])   4. Sinus bradycardia     PLAN:    # Resistant hypertension: Ms. Kirchner has struggled with difficult to control blood pressure for years.  She had renal artery Dopplers and normal or normal nearly a decade ago.  However she has a history of PAD.  We will repeat her Dopplers.  She is going to try and work on getting more exercise.  I do think that stress and anxiety have been contributing to her blood pressures being higher than usual lately.  She is interested in getting grief counseling to address her husband's death.  Medication choices are limited by bradycardia and renal dysfunction.  She also has intolerance to several medicines.  We will try doxazosin 2 mg nightly.  Continue chlorthalidone, hydralazine, irbesartan, and as needed metoprolol.  She will track her pressures twice daily and bring to follow-up with our pharmacist in 1 month.  She was also given a handbook about hypertension management.  # PAF:  Continue Eliquis and prn metoprolol.  # Bradycardia:  Avoid nodal agents.  # PAD:  # Hyperlipidemia: Continue Eliquis.  Lipids not well-controlled.  Given her allergies will focus on one medication at  a time.  Time spent: 45 minutes-Greater than 50% of this time was spent in counseling, explanation of diagnosis, planning of  further management, and coordination of care.   Disposition:    FU with MD/PharmD in 1 month    Medication Adjustments/Labs and Tests Ordered: Current medicines are reviewed at length with the patient today.  Concerns regarding medicines are outlined above.  Orders Placed This Encounter  Procedures  . EKG 12-Lead  . VAS US RENAL ARTERY DUPLEX   Meds ordered this encounter  Medications  . doxazosin (CARDURA) 2 MG tablet    Sig: Take 1 tablet (2 mg total) by mouth at bedtime.    Dispense:  90 tablet    Refill:  3     Signed, Skeet Latch, MD  05/10/2020 5:03 PM    [facility]

## 2020-05-13 ENCOUNTER — Telehealth: Payer: Self-pay | Admitting: Licensed Clinical Social Worker

## 2020-05-13 NOTE — Progress Notes (Signed)
Heart and Vascular Care Navigation  05/13/2020  [name] [birth year] [id number]  Reason for Referral:  Grief counseling; community support                                                                                                    Assessment:                                     LCSW spoke with pt via telephone this morning at [phone]. Introduced self, role, reason for call. Confirmed home address, PCP and emergency contacts. Pt now lives alone since the passing of her husband last year. She  Pt confirms that she had shared with Dr. Oval Linsey and Rip Harbour that she has had a hard time w/ lonliness since her husband passed. Her son lives in [place] and her daughter in [place]. She has family that takes her to appointments and checks in with her but it has been different without someone being there with her. I shared my condolences and support for pt that her feelings are normal. I shared that there are resources that may be able to support her with working through her grief and could provide in person and telephonic support. She is okay with me sending those resources to her today.   HRT/VAS Care Coordination    Patients Home Cardiology Office [place] Team Social Worker   Social Worker Name: Margarito Liner [place], [phone]   Living arrangements for the past 2 months Single Family Home   Lives with: Self   Patient Current Insurance Coverage Traditional Medicare; Commercial Insurance   Patient Has Concern With Paying Medical Bills No   Does Patient Have Prescription Coverage? Yes   Home Assistive Devices/Equipment None   DME Agency [facility]   Julian ([place])   Current home services DME  [facility]      Social History:                                                                             SDOH Screenings   Alcohol Screen: Low Risk   . Last Alcohol Screening Score (AUDIT): 0  Depression  (PHQ2-9): Low Risk   . PHQ-2 Score: 4  Financial Resource Strain: Low Risk   . Difficulty of Paying Living Expenses: Not hard at all  Food Insecurity: No Food Insecurity  . Worried About Charity fundraiser in the Last Year: Never true  . Ran Out of Food in the Last Year: Never true  Housing: Low Risk   . Last Housing Risk Score: 0  Physical Activity: Not on file  Social Connections: Not on file  Stress: Stress Concern Present  .  Feeling of Stress : To some extent  Tobacco Use: Low Risk   . Smoking Tobacco Use: Never Smoker  . Smokeless Tobacco Use: Never Used  Transportation Needs: No Transportation Needs  . Lack of Transportation (Medical): No  . Lack of Transportation (Non-Medical): No   Other Care Navigation Interventions:     Patient expressed Mental Health concerns Loneliness and grief from loss of her husband.  Patient Referred to: [facility], [place], Senior Resources list   Follow-up plan:   I have sent pt information about [facility] grief support and [place] resources list. LCSW also sent a list of [employer] of [place] activities. My card was included in the mail and I will f/u in 1-2 weeks to ensure resources received and provide any additional support needed.

## 2020-05-22 ENCOUNTER — Inpatient Hospital Stay (HOSPITAL_COMMUNITY): Admission: RE | Admit: 2020-05-22 | Payer: Managed Care, Other (non HMO) | Source: Ambulatory Visit

## 2020-05-23 ENCOUNTER — Telehealth: Payer: Self-pay | Admitting: Licensed Clinical Social Worker

## 2020-05-23 NOTE — Progress Notes (Signed)
Heart and Vascular Care Navigation  05/23/2020  [name] 1925-09-20 [id number]  Reason for Referral:  Engaged with patient by telephone for follow up visit for Heart and Vascular Care Coordination.                                                                                                   Assessment:  LCSW spoke with pt via telephone, introduced self, role, reason for call. Introduced self, role, reason for call again. LCSW checked in on the patient and she shares she is feeling okay, she has had intermittent discomfort from her stomach and a lingering cough but today feels okay.   LCSW spoke with pt about grief/community support resources sent and she confirms she received them. She shares she feels comfortable calling and will let me know if she has any issues w/ calling and arranging things.   Pt has my number for any additional questions or concerns.                                         HRT/VAS Care Coordination    Patients Home Cardiology Office [place] Team Social Worker   Social Worker Name: Margarito Liner Aniwa, [phone]   Living arrangements for the past 2 months Single Family Home   Lives with: Self   Patient Current Insurance Coverage Traditional Medicare; Commercial Insurance   Patient Has Concern With Paying Medical Bills No   Does Patient Have Prescription Coverage? Yes   Home Assistive Devices/Equipment None   DME Agency [employer]   [place]   Current home services DME  [facility]      Social History:                                                                             SDOH Screenings   Alcohol Screen: Low Risk   . Last Alcohol Screening Score (AUDIT): 0  Depression (PHQ2-9): Low Risk   . PHQ-2 Score: 4  Financial Resource Strain: Low Risk   . Difficulty of Paying Living Expenses: Not hard at all  Food Insecurity: No Food Insecurity  . Worried About  Charity fundraiser in the Last Year: Never true  . Ran Out of Food in the Last Year: Never true  Housing: Low Risk   . Last Housing Risk Score: 0  Physical Activity: Not on file  Social Connections: Not on file  Stress: Stress Concern Present  . Feeling of Stress : To some extent  Tobacco Use: Low Risk   . Smoking Tobacco Use: Never Smoker  . Smokeless Tobacco Use: Never Used  Transportation Needs: No Transportation Needs  .  Lack of Transportation (Medical): No  . Lack of Transportation (Non-Medical): No   Follow-up plan:   No additional patient needs at this time, I remain available as needed for pt moving forward. She seems to be coping well, she is aware she would like to have more community support and is open to pursuing the different options I provided.

## 2020-05-28 ENCOUNTER — Ambulatory Visit: Payer: Medicare Other | Admitting: Physician Assistant

## 2020-05-29 ENCOUNTER — Ambulatory Visit (HOSPITAL_COMMUNITY)
Admission: RE | Admit: 2020-05-29 | Discharge: 2020-05-29 | Disposition: A | Discharge status: Home / Self Care | Payer: Medicare Other | Source: Ambulatory Visit | Attending: Cardiology | Admitting: Cardiology

## 2020-05-29 ENCOUNTER — Other Ambulatory Visit: Payer: Self-pay

## 2020-05-29 DIAGNOSIS — I10 Essential (primary) hypertension: Secondary | ICD-10-CM | POA: Insufficient documentation

## 2020-06-06 ENCOUNTER — Other Ambulatory Visit: Payer: Self-pay | Admitting: Family Medicine

## 2020-06-11 ENCOUNTER — Ambulatory Visit (INDEPENDENT_AMBULATORY_CARE_PROVIDER_SITE_OTHER): Payer: Medicare Other | Admitting: Pharmacist Clinician (PhC)/ Clinical Pharmacy Specialist

## 2020-06-11 ENCOUNTER — Other Ambulatory Visit: Payer: Self-pay

## 2020-06-11 DIAGNOSIS — I10 Essential (primary) hypertension: Secondary | ICD-10-CM | POA: Diagnosis not present

## 2020-06-11 NOTE — Progress Notes (Signed)
06/12/2020 [name] 1925-12-03 [id number]   HPI:  [name] is a 87 y.o. female patient of Dr Oval Linsey, with a [place] below who presents today for hypertension clinic evaluation.  When she saw Dr. Oval Linsey last month her  Pressure was noted to be 198/78.  She noted that the patient's husband died in 12-Dec-2022 of Covid (married 32 years).   Patient had just recently come back from visiting her daughter in [place] at the time of visit.  Dr. Oval Linsey added doxazosin 2 mg each night to her medication regimen.   Today she returns for follow up, accompanied by her son.  She is feeling well and has no specific concerns.  She does note that after taking first dose of doxazosin, her pressure the next morning was 103/46.  She decided that was too low and from then on has only been taking 1/2 tablet (1 mg total) of the doxazosin each night.  See below for summary of home readings.  She is not interested in taking any more medications, feels as though she is on enough  Past Medical History: PAF CHADS2-VASc 8 (no CHF) - on Eliquis 2.5 bid  ASCVD PAD - 2015 PCI to occluded R popliteal artery /stroke 3-4 per pt, no AF at time of last (2013)  anemia 1/22 - Hgb/hct 10.1/30.8  hypothyorid 1/22 - TSH 4.16 - on Armour Thyroid 15 mg  CKD Stage 3, GFR 26.7     Blood Pressure Goal:  130/80  Current Medications: irbesartan 300 mg qd (1/2 bid), chlorthalidone 25 mg qd (am), hydralazine 25 mg tid (am, 5pm, 11pm), doxazosin 1 mg qhs  Social Hx: no tobacco, no alcohol, no regular caffeine  Diet: eats mostly home prepared foods, does not add salt  Exercise: has exercise bike, rides for 20 minutes every other day, has missed a few days recently; stretching exercise most days   Home BP readings: systolic read within 10 points, diastolic read 10 points low.    AM 26 readings - average 131/49, HR 53; range 103-153/43-59  PM 23 readings - average 151/53, HR 54; range 129-189/43-72  Intolerances:  amlodipine - edema; beta blockers - bradycardia; hydralazine - weakness, nausea, ACEI - cough  Labs: 1/22:  Na 137, K 4.1, Glu 83, BUN 39, SCr 1.66, GFR 26.7   Wt Readings from Last 3 Encounters:  06/11/20 120 lb 9.6 oz (54.7 kg)  05/10/20 120 lb (54.4 kg)  03/14/20 117 lb 6.4 oz (53.3 kg)   BP Readings from Last 3 Encounters:  06/11/20 (!) 128/54  05/10/20 (!) 198/78  03/14/20 (!) 140/56   Pulse Readings from Last 3 Encounters:  06/11/20 (!) 51  05/10/20 62  03/14/20 (!) 50    Current Outpatient Medications  Medication Sig Dispense Refill  . apixaban (ELIQUIS) 2.5 MG TABS tablet Take 1 tablet (2.5 mg total) by mouth 2 (two) times daily. 180 tablet 3  . chlorthalidone (HYGROTON) 25 MG tablet TAKE 1 TABLET(25 MG) BY MOUTH DAILY 90 tablet 3  . Digestive Enzyme CAPS Take 1 capsule by mouth See admin instructions. Mix the contents of 1 capsule into water and drink with all meals    . dorzolamide-timolol (COSOPT) 22.3-6.8 MG/ML ophthalmic solution Place 1 drop into the left eye in the morning and at bedtime.     [name] doxazosin (CARDURA) 2 MG tablet Take 1 tablet (2 mg total) by mouth at bedtime. 90 tablet 3  . hydrALAZINE (APRESOLINE) 25 MG tablet Take 1 tablet (  25 mg total) by mouth 3 (three) times daily. 90 tablet 5  . irbesartan (AVAPRO) 300 MG tablet Take 1 tablet (300 mg total) by mouth daily. 90 tablet 3  . metoprolol tartrate (LOPRESSOR) 25 MG tablet Take 0.5 tablets (12.5 mg total) by mouth daily as needed (HR >110 take only BID). 180 tablet 3  . Multiple Vitamin (MULTIVITAMIN) LIQD Take 30 mLs by mouth daily.    . Nutritional Supplements (FEEDING SUPPLEMENT, KATE FARMS STANDARD 1.4,) LIQD liquid Take 325 mLs by mouth 3 (three) times daily between meals.    . Papaya TABS Take 3 tablets by mouth 3 (three) times daily.     . Probiotic CAPS Take 1 capsule by mouth daily.    [name] thyroid (ARMOUR THYROID) 15 MG tablet Take 1 tablet (15 mg total) by mouth daily. 90 tablet 1   No current  facility-administered medications for this visit.    Allergies  Allergen Reactions  . Aceon [Perindopril Erbumine] Cough  . Amlodipine Other (See Comments)    Lower extremity discomfort  . Aspirin Other (See Comments) and Cough    STOPPED BY A MEDICAL PROFESSIONAL  . Azor [Amlodipine-Olmesartan] Cough  . Beta Adrenergic Blockers Other (See Comments)    Bradycardia  . Hydralazine Nausea And Vomiting and Other (See Comments)    Weakness- tolerating in 2021   . Milk-Related Compounds Other (See Comments) and Cough    STOPPED BY A MEDICAL PROFESSIONAL  . Other Other (See Comments) and Cough    NUTS- ALL TYPES- (STOPPED BY A MEDICAL PROFESSIONAL) NO FOODS THAT CAUSE GAS (will cause A-Fib)  . Peanut-Containing Drug Products Other (See Comments) and Cough    STOPPED BY A MEDICAL PROFESSIONAL  . Sulfamethoxazole-Trimethoprim Other (See Comments)    Patient cannot specifically recall the reaction  . Vitamin D Analogs Cough    Past Medical History:  Diagnosis Date  . Arthritis   . Chicken pox   . Glaucoma of both eyes   . High cholesterol   . Hypertension    a. Normal renal arteries by duplex 2013.  [name] Hypothyroidism   . Migraine   . PAD (peripheral artery disease) ([place])    a. 04/2013: s/p PCI to occluded right popliteal artery   . PAF (paroxysmal atrial fibrillation) ([place])    a. s/p MAZE 2006. b. Event monitor 2013: NSR with PACs.  . Sinus bradycardia    a. 03/2011 during hospital admission - beta blocker stopped.  . Stroke [facility])    a. Pt reports 3-4 prior strokes without residual sx. b. CVA 2013 (presented with headache) - started on Plavix. Event monitor as outpt - no AF.    Blood pressure (!) 128/54, pulse (!) 51, resp. rate 17, height 5\' 3"  (1.6 m), weight 120 lb 9.6 oz (54.7 kg), SpO2 97 %.  Hypertension Patient with essential hypertension, not at goal, but with low diastolic readings.  Her home cuff did read 10 points lower in the diastolic readings.  Suggested that she  try taking her second dose of hydralazine around 2 pm (with lunch) rather than later, to see if evening systolic readings will decrease slightly.  Otherwise, no changes to medications, as diastolic reading is most likely running in the low 50's.  She should continue with other medications and we will see her back in 2 months for follow up.    Tommy Medal PharmD CPP [facility] [address] [place], [place] [address] [phone]

## 2020-06-11 NOTE — Patient Instructions (Addendum)
Return for a a follow up appointment Tuesday June 7 at 2:30 pm  If you need to change this appointment call Corrion Stirewalt/Raquel at [phone]  Check your blood pressure at home daily and keep record of the readings.  Take your BP meds as follows:  Move your mid-day dose of hydralazine to 2 pm to see if this will help lower your nighttime blood pressures  Continue all other medications  Bring all of your meds, your BP cuff and your record of home blood pressures to your next appointment.  Exercise as you're able, try to walk approximately 30 minutes per day.  Keep salt intake to a minimum, especially watch canned and prepared boxed foods.  Eat more fresh fruits and vegetables and fewer canned items.  Avoid eating in fast food restaurants.    HOW TO TAKE YOUR BLOOD PRESSURE: . Rest 5 minutes before taking your blood pressure. .  Don't smoke or drink caffeinated beverages for at least 30 minutes before. . Take your blood pressure before (not after) you eat. . Sit comfortably with your back supported and both feet on the floor (don't cross your legs). . Elevate your arm to heart level on a table or a desk. . Use the proper sized cuff. It should fit smoothly and snugly around your bare upper arm. There should be enough room to slip a fingertip under the cuff. The bottom edge of the cuff should be 1 inch above the crease of the elbow. . Ideally, take 3 measurements at one sitting and record the average.

## 2020-06-12 ENCOUNTER — Encounter: Payer: Self-pay | Admitting: Pharmacist Clinician (PhC)/ Clinical Pharmacy Specialist

## 2020-06-12 NOTE — Assessment & Plan Note (Signed)
Patient with essential hypertension, not at goal, but with low diastolic readings.  Her home cuff did read 10 points lower in the diastolic readings.  Suggested that she try taking her second dose of hydralazine around 2 pm (with lunch) rather than later, to see if evening systolic readings will decrease slightly.  Otherwise, no changes to medications, as diastolic reading is most likely running in the low 50's.  She should continue with other medications and we will see her back in 2 months for follow up.

## 2020-06-13 ENCOUNTER — Ambulatory Visit (INDEPENDENT_AMBULATORY_CARE_PROVIDER_SITE_OTHER): Payer: Medicare Other | Admitting: Family Medicine

## 2020-06-13 ENCOUNTER — Encounter: Payer: Self-pay | Admitting: Family Medicine

## 2020-06-13 ENCOUNTER — Other Ambulatory Visit: Payer: Self-pay

## 2020-06-13 VITALS — BP 139/64 | HR 54 | Temp 98.3°F | Ht 63.0 in | Wt 121.2 lb

## 2020-06-13 DIAGNOSIS — E785 Hyperlipidemia, unspecified: Secondary | ICD-10-CM | POA: Diagnosis not present

## 2020-06-13 DIAGNOSIS — I10 Essential (primary) hypertension: Secondary | ICD-10-CM

## 2020-06-13 DIAGNOSIS — E039 Hypothyroidism, unspecified: Secondary | ICD-10-CM | POA: Diagnosis not present

## 2020-06-13 DIAGNOSIS — N183 Chronic kidney disease, stage 3 unspecified: Secondary | ICD-10-CM | POA: Diagnosis not present

## 2020-06-13 DIAGNOSIS — E559 Vitamin D deficiency, unspecified: Secondary | ICD-10-CM | POA: Diagnosis not present

## 2020-06-13 DIAGNOSIS — R739 Hyperglycemia, unspecified: Secondary | ICD-10-CM

## 2020-06-13 NOTE — Patient Instructions (Addendum)
Please stop by lab before you go If you have mychart- we will send your results within 3 business days of [place] receiving them.  If you do not have mychart- we will call you about results within 5 business days of [place] receiving them.  *please also note that you will see labs on mychart as soon as they post. I will later go in and write notes on them- will say "notes from Dr. Yong Channel"  No changes today unless labs lead [place] to make changes  If kidney function remains lower- likely refer to kidney doctor  Thrilled blood pressure looks better- thank you for seeing Dr Oval Linsey  Recommended follow up: Return in about 3 months (around 09/12/2020) for follow up- or sooner if needed.

## 2020-06-13 NOTE — Progress Notes (Signed)
Phone [phone] In person visit   Subjective:   [name] is a 87 y.o. year old very pleasant female patient who presents for/with See problem oriented charting Chief Complaint  Patient presents with  . Hypothyroidism  . Hypertension  . Hyperlipidemia  . Vitamin D Deficiency  . Vitamin b12 Deficiency     This visit occurred during the SARS-CoV-2 public health emergency.  Safety protocols were in place, including screening questions prior to the visit, additional usage of staff PPE, and extensive cleaning of exam room while observing appropriate contact time as indicated for disinfecting solutions.   Past Medical History-  Patient Active Problem List   Diagnosis Date Noted  . COVID-19 10/12/2019    Priority: High  . Chronic cough 08/12/2015    Priority: High  . PAD (peripheral artery disease) ([place]) 05/10/2013    Priority: High  . PAF (paroxysmal atrial fibrillation) ([facility])     Priority: High  . History of stroke     Priority: High  . CKD (chronic kidney disease), stage III 06/15/2018    Priority: Medium  . Hyperglycemia 06/25/2014    Priority: Medium  . Hypothyroidism 06/07/2014    Priority: Medium  . Hyperlipidemia 06/07/2014    Priority: Medium  . Hypertension     Priority: Medium  . GERD (gastroesophageal reflux disease) 06/25/2014    Priority: Low  . Vitamin D deficiency 06/25/2014    Priority: Low  . Intracervical pessary 06/07/2014    Priority: Low  . Sinus bradycardia 05/19/2013    Priority: Low  . PAC (premature atrial contraction) 05/20/2011    Priority: Low  . Anemia of chronic disease 12/14/2019  . Low vitamin B12 level 12/09/2017  . Anemia 08/16/2017  . Perennial allergic rhinitis 08/12/2015    Medications- reviewed and updated Current Outpatient Medications  Medication Sig Dispense Refill  . apixaban (ELIQUIS) 2.5 MG TABS tablet Take 1 tablet (2.5 mg total) by mouth 2 (two) times daily. 180 tablet 3  . chlorthalidone (HYGROTON) 25 MG  tablet TAKE 1 TABLET(25 MG) BY MOUTH DAILY 90 tablet 3  . Digestive Enzyme CAPS Take 1 capsule by mouth See admin instructions. Mix the contents of 1 capsule into water and drink with all meals    . dorzolamide-timolol (COSOPT) 22.3-6.8 MG/ML ophthalmic solution Place 1 drop into the left eye in the morning and at bedtime.     [name] doxazosin (CARDURA) 2 MG tablet Take 1 tablet (2 mg total) by mouth at bedtime. 90 tablet 3  . hydrALAZINE (APRESOLINE) 25 MG tablet Take 1 tablet (25 mg total) by mouth 3 (three) times daily. 90 tablet 5  . irbesartan (AVAPRO) 300 MG tablet Take 1 tablet (300 mg total) by mouth daily. 90 tablet 3  . metoprolol tartrate (LOPRESSOR) 25 MG tablet Take 0.5 tablets (12.5 mg total) by mouth daily as needed (HR >110 take only BID). 180 tablet 3  . Multiple Vitamin (MULTIVITAMIN) LIQD Take 30 mLs by mouth daily.    . Nutritional Supplements (FEEDING SUPPLEMENT, KATE FARMS STANDARD 1.4,) LIQD liquid Take 325 mLs by mouth 3 (three) times daily between meals.    . Papaya TABS Take 3 tablets by mouth 3 (three) times daily.     . Probiotic CAPS Take 1 capsule by mouth daily.    [name] thyroid (ARMOUR THYROID) 15 MG tablet Take 1 tablet (15 mg total) by mouth daily. 90 tablet 1   No current facility-administered medications for this visit.     Objective:  BP 139/64   Pulse (!) 54   Temp 98.3 F (36.8 C) (Temporal)   Ht '5\' 3"'  (1.6 m)   Wt 121 lb 3.2 oz (55 kg)   SpO2 99%   BMI 21.47 kg/m  Gen: NAD, resting comfortably CV: RRR no murmurs rubs or gallops Lungs: CTAB no crackles, wheeze, rhonchi Abdomen: soft/nontender/nondistended/normal bowel sounds. No rebound or guarding.  Ext: no edema Skin: warm, dry     Assessment and Plan   #hypertension S: medication: Chlorthalidone 54m, metoprolol 228mBID ONLY if HR gets high, Irbesartan 30066mdoxazosin only taking half tablet so 1 mg, hydralazine 25 mg TID- with 2 PM for second dose,  BP Readings from Last 3 Encounters:   06/13/20 139/64  06/11/20 (!) 128/54  05/10/20 (!) 198/78  A/P: blood pressure is well controlled- continue current medicine- greatly appreciate Dr. RanBlenda [place]  # Atrial fibrillation S: Rate controlled with metoprolol 12.5 mg BID only if needed- last HR elevation was november Anticoagulated with eliquis 2.5 mg BID A/P: Stable. Continue current medications.   #Chronic cough- stopped a weak ago. She stopped her omeprazole 71m18mweeks ago interestingly enough and symptoms stopped a week later. She did not fele like it helped when she was on it.   #hyperlipidemia/statin intolerance istory S: Medication:none  Lab Results  Component Value Date   CHOL 225 (H) 12/06/2019   HDL 67 12/06/2019   LDLCALC 133 (H) 12/06/2019   TRIG 141 12/06/2019   CHOLHDL 3.4 12/06/2019   A/P: mildly elevated but at her age would not start medicine  Other than history of CVA BUT - in retrospect with prior stroke- may have been a fib related.   #hypothyroidism S: compliant On thyroid medication- Armour THyroid 15mg90m Results  Component Value Date   TSH 4.16 03/14/2020   A/P:thyroid well controlled last visit- continue to monitor   #CKD III-after covid-19 patient's kidney function appeared to progress to chronic kidney disease stage IV- we suggested referral to nephrology after last visit but patient wanted to discuss with family-today she would like to check levels again before the referral-we will reach out to her once we get the results  #Anemia- some of patients anemia could be from chronic kidney disease- we will monitor with labs- she has not wanted to do bone marrow biopsy unless levels worsen nor has her oncologist. Consideration for aranesp- pending eval with nephrology if she is willing. Hold off on referral back to oncology unless hgb worsens or significant worsening in white count- chronic for her Lab Results  Component Value Date   WBC 2.7 (L) 03/14/2020   HGB 10.1 (L) 03/14/2020   HCT  30.8 (L) 03/14/2020   MCV 81.8 03/14/2020   PLT 152.0 03/14/2020   Recommended follow up: No follow-ups on file. Future Appointments  Date Time Provider [place]/2022 10:45 AM WeaveRichardson DoppA-C [facility]  07/30/2020  2:30 PM [facility] PHARMACIST [facility]    Lab/Order associations:   ICD-10-CM   1. Primary hypertension  I10 Comprehensive metabolic panel    CBC with Differential/Platelet  2. Hypothyroidism, unspecified type  E03.9 TSH  3. Stage 3 chronic kidney disease, unspecified whether stage 3a or 3b CKD (HCC)  N18.30   4. Vitamin D deficiency  E55.9   5. Low vitamin B12 level  E53.8   6. Hyperlipidemia, unspecified hyperlipidemia type  E78.5   7. Hyperglycemia  R73.9   update a1c with labs Lab Results  Component Value Date  HGBA1C 6.2 10/11/2018   Also check vitamin D with labs  Return precautions advised.  Garret Reddish, MD

## 2020-06-14 LAB — COMPREHENSIVE METABOLIC PANEL
ALT: 9 U/L (ref 0–35)
AST: 14 U/L (ref 0–37)
Albumin: 4 g/dL (ref 3.5–5.2)
Alkaline Phosphatase: 59 U/L (ref 39–117)
BUN: 45 mg/dL — ABNORMAL HIGH (ref 6–23)
CO2: 26 mEq/L (ref 19–32)
Calcium: 9.4 mg/dL (ref 8.4–10.5)
Chloride: 104 mEq/L (ref 96–112)
Creatinine, Ser: 1.61 mg/dL — ABNORMAL HIGH (ref 0.40–1.20)
GFR: 27.63 mL/min — ABNORMAL LOW (ref >60.00)
Glucose, Bld: 89 mg/dL (ref 70–99)
Potassium: 4.1 mEq/L (ref 3.5–5.1)
Sodium: 138 mEq/L (ref 135–145)
Total Bilirubin: 0.4 mg/dL (ref 0.2–1.2)
Total Protein: 6.7 g/dL (ref 6.0–8.3)

## 2020-06-14 LAB — CBC WITH DIFFERENTIAL/PLATELET
Basophils Absolute: 0 10*3/uL (ref 0.0–0.1)
Basophils Relative: 0.7 % (ref 0.0–3.0)
Eosinophils Absolute: 0.1 10*3/uL (ref 0.0–0.7)
Eosinophils Relative: 1.9 % (ref 0.0–5.0)
HCT: 29.6 % — ABNORMAL LOW (ref 36.0–46.0)
Hemoglobin: 9.7 g/dL — ABNORMAL LOW (ref 12.0–15.0)
Lymphocytes Relative: 24.5 % (ref 12.0–46.0)
Lymphs Abs: 0.7 10*3/uL (ref 0.7–4.0)
MCHC: 33 g/dL (ref 30.0–36.0)
MCV: 81.9 fl (ref 78.0–100.0)
Monocytes Absolute: 0.4 10*3/uL (ref 0.1–1.0)
Monocytes Relative: 15 % — ABNORMAL HIGH (ref 3.0–12.0)
Neutro Abs: 1.7 10*3/uL (ref 1.4–7.7)
Neutrophils Relative %: 57.9 % (ref 43.0–77.0)
Platelets: 157 10*3/uL (ref 150.0–400.0)
RBC: 3.61 Mil/uL — ABNORMAL LOW (ref 3.87–5.11)
RDW: 14.2 % (ref 11.5–15.5)
WBC: 3 10*3/uL — ABNORMAL LOW (ref 4.0–10.5)

## 2020-06-14 LAB — VITAMIN D 25 HYDROXY (VIT D DEFICIENCY, FRACTURES): VITD: 27.83 ng/mL — ABNORMAL LOW (ref 30.00–100.00)

## 2020-06-14 LAB — HEMOGLOBIN A1C: Hgb A1c MFr Bld: 6 % (ref 4.6–6.5)

## 2020-06-14 LAB — TSH: TSH: 4.34 u[IU]/mL (ref 0.35–4.50)

## 2020-06-19 DIAGNOSIS — N8189 Other female genital prolapse: Secondary | ICD-10-CM | POA: Diagnosis not present

## 2020-07-02 NOTE — Progress Notes (Addendum)
Cardiology Office Note:    Date:  07/03/2020   ID:  [name], DOB [birth year], MRN [id number]  PCP:  [name], MD   [facility] Providers Cardiologist:  Lauree Chandler, MD     Referring MD: [name], MD   Chief Complaint:  Follow-up (Atrial fibrillation, hypertension)    Patient Profile:    [name] is a 87 y.o. female with:   Paroxysmal atrial fibrillation   S/p Maze procedure in 2006  ED visit in 1/21, 10/21 with AF w RVR >>Apixaban   PVCs  PACs   Hypertension   Difficult to control   Multiple med intol  Hx of CVA   Peripheral arterial disease    S/p stent to R popliteal artery in 2015  Hyperlipidemia  Hypothyroidism  Bradycardia >> beta-blocker DCd during admit in 2013   Prior CV studies: Renal artery [place] 05/29/20 Normal renal arteries bilat  Holter 11/2015 Sinus bradycardia, lowest heart rate 39 bpm Frequent premature ventricular contractions with post PVC pauses. Rare premature atrial contractions with several short (5 beat) runs of supraventricular tachycardia  Echocardiogram 08/02/13 ([place]) EF 55-60, Trace TR, mild pulmonary hypertension, RVSP 35.62  Renal Art [place] 12/30/11 Normal abd aorta, normal renal arteries bilat  History of Present Illness: [name] was last seen by Dr. Angelena Form in 6/21.  She saw Coletta Memos, NP in 11/21 after a trip to the ED for recurrent AF with RVR.  She spontaneously converted to NSR.  She has been followed in the HTN clinic as well for difficult to control BP.  She was last seen there in 4/22.  She returns for f/u.  She is here alone.  She is doing well.  She has not had chest pain, significant shortness of breath, syncope or near syncope, significant leg edema.  She has noted some diarrhea since starting on doxazosin.        Past Medical History:  Diagnosis Date  . Arthritis   . Chicken pox   . Glaucoma of both eyes   . High cholesterol   . Hypertension    a. Normal  renal arteries by duplex 2013.  [name] Hypothyroidism   . Migraine   . PAD (peripheral artery disease) ([place])    a. 04/2013: s/p PCI to occluded right popliteal artery   . PAF (paroxysmal atrial fibrillation) ([place])    a. s/p MAZE 2006. b. Event monitor 2013: NSR with PACs.  . Sinus bradycardia    a. 03/2011 during hospital admission - beta blocker stopped.  . Stroke [facility])    a. Pt reports 3-4 prior strokes without residual sx. b. CVA 2013 (presented with headache) - started on Plavix. Event monitor as outpt - no AF.    Current Medications: Current Meds  Medication Sig  . apixaban (ELIQUIS) 2.5 MG TABS tablet Take 1 tablet (2.5 mg total) by mouth 2 (two) times daily.  . chlorthalidone (HYGROTON) 25 MG tablet TAKE 1 TABLET(25 MG) BY MOUTH DAILY  . Digestive Enzyme CAPS Take 1 capsule by mouth See admin instructions. Mix the contents of 1 capsule into water and drink with all meals  . dorzolamide-timolol (COSOPT) 22.3-6.8 MG/ML ophthalmic solution Place 1 drop into the left eye in the morning and at bedtime.   [name] doxazosin (CARDURA) 2 MG tablet Take 1 tablet (2 mg total) by mouth at bedtime.  . hydrALAZINE (APRESOLINE) 25 MG tablet Take 1 tablet (25 mg total) by mouth 3 (three) times daily.  . irbesartan (  AVAPRO) 300 MG tablet Take 1 tablet (300 mg total) by mouth daily.  . Multiple Vitamin (MULTIVITAMIN) LIQD Take 30 mLs by mouth daily.  . Nutritional Supplements (FEEDING SUPPLEMENT, KATE FARMS STANDARD 1.4,) LIQD liquid Take 325 mLs by mouth 3 (three) times daily between meals.  . Papaya TABS Take 3 tablets by mouth 3 (three) times daily.   . Probiotic CAPS Take 1 capsule by mouth daily.  [name] thyroid (ARMOUR THYROID) 15 MG tablet Take 1 tablet (15 mg total) by mouth daily.     Allergies:   Aceon [perindopril erbumine], Amlodipine, Aspirin, Azor [amlodipine-olmesartan], Beta adrenergic blockers, Hydralazine, Milk-related compounds, Other, Peanut-containing drug products,  Sulfamethoxazole-trimethoprim, and Vitamin d analogs   Social History   Tobacco Use  . Smoking status: Never Smoker  . Smokeless tobacco: Never Used  Vaping Use  . Vaping Use: Never used  Substance Use Topics  . Alcohol use: No    Alcohol/week: 0.0 standard drinks  . Drug use: No     Family Hx: The patient's family history includes Cancer in her brother; Cervical cancer in her sister; Hypertension in her mother; Lung cancer in her brother; Throat cancer in her brother; Ulcers in her father. There is no history of Allergic rhinitis, Angioedema, Asthma, Atopy, Eczema, Immunodeficiency, or Urticaria.  Review of Systems  Cardiovascular: Negative for claudication.  Gastrointestinal: Positive for diarrhea. Negative for hematochezia.  Genitourinary: Negative for hematuria.     EKGs/Labs/Other Test Reviewed:    EKG:  EKG is not ordered today.  The ekg ordered today demonstrates n/a  Recent Labs: 10/18/2019: Magnesium 2.7 12/20/2019: B Natriuretic Peptide 234.8 06/13/2020: ALT 9; BUN 45; Creatinine, Ser 1.61; Hemoglobin 9.7; Platelets 157.0; Potassium 4.1; Sodium 138; TSH 4.34   Recent Lipid Panel Lab Results  Component Value Date/Time   CHOL 225 (H) 12/06/2019 03:00 PM   TRIG 141 12/06/2019 03:00 PM   HDL 67 12/06/2019 03:00 PM   CHOLHDL 3.4 12/06/2019 03:00 PM   LDLCALC 133 (H) 12/06/2019 03:00 PM      Risk Assessment/Calculations:    CHA2DS2-VASc Score = 7  This indicates a 11.2% annual risk of stroke. The patient's score is based upon: CHF History: No HTN History: Yes Diabetes History: No Stroke History: Yes Vascular Disease History: Yes Age Score: 2 Gender Score: 1     Physical Exam:    VS:  BP 138/70   Pulse (!) 44   Ht 5\' 3"  (1.6 m)   Wt 118 lb 12.8 oz (53.9 kg)   SpO2 96%   BMI 21.04 kg/m     Wt Readings from Last 3 Encounters:  07/03/20 118 lb 12.8 oz (53.9 kg)  06/13/20 121 lb 3.2 oz (55 kg)  06/11/20 120 lb 9.6 oz (54.7 kg)      Constitutional:      Appearance: Healthy appearance. Not in distress.  Neck:     Vascular: JVD normal.  Pulmonary:     Effort: Pulmonary effort is normal.     Breath sounds: No wheezing. No rales.  Cardiovascular:     Bradycardia present. Regular rhythm. Normal S1. Normal S2.     Murmurs: There is no murmur.  Edema:    Ankle: bilateral trace edema of the ankle. Abdominal:     Palpations: Abdomen is soft. There is no hepatomegaly.  Skin:    General: Skin is warm and dry.  Neurological:     Mental Status: Alert and oriented to person, place and time.     Cranial Nerves:  Cranial nerves are intact.        ASSESSMENT & PLAN:    1. PAF (paroxysmal atrial fibrillation) (HCC) Maintaining sinus rhythm on exam today.  She is tolerating anticoagulation.  Creatinine is above 1.5, she is older than 80 and her weight is under 60 kg.  Continue apixaban 2.5 mg twice daily.  Recent hemoglobin, creatinine stable.  Follow-up in 6 months.  2. Essential hypertension She brings in a list of her blood pressures from home.  Most of her a.m. readings are optimal.  She has higher readings in the evenings.  I have asked her to check her blood pressure in the evening after taking her evening medications.  She has had some diarrhea related to doxazosin.  I have asked her to hold the doxazosin for few days.  If her diarrhea does not resolve, she should resume and follow-up with primary care.  If her diarrhea resolves, she can try the doxazosin again.  If the diarrhea returns, she should call us so we can make adjustments in her medications and get her off of this medication.  3. PAD (peripheral artery disease) ([place]) History of stent to the right popliteal in 2015.  She has not had any claudication.  She is not on aspirin as she is on Apixaban.  She has been intolerant to statins in the past.     Dispo:  Return in about 6 months (around 01/03/2021) for Routine Follow Up with Dr. Angelena Form.   Medication  Adjustments/Labs and Tests Ordered: Current medicines are reviewed at length with the patient today.  Concerns regarding medicines are outlined above.  Tests Ordered: No orders of the defined types were placed in this encounter.  Medication Changes: No orders of the defined types were placed in this encounter.   Signed, Richardson Dopp, PA-C  07/03/2020 12:56 PM    [facility] [address], [place], [place]  [address] Phone: [phone]; Fax: [phone]

## 2020-07-03 ENCOUNTER — Ambulatory Visit (INDEPENDENT_AMBULATORY_CARE_PROVIDER_SITE_OTHER): Payer: Medicare Other | Admitting: Physician Assistant

## 2020-07-03 ENCOUNTER — Encounter: Payer: Self-pay | Admitting: Physician Assistant

## 2020-07-03 ENCOUNTER — Other Ambulatory Visit: Payer: Self-pay

## 2020-07-03 VITALS — BP 138/70 | HR 44 | Ht 63.0 in | Wt 118.8 lb

## 2020-07-03 DIAGNOSIS — I739 Peripheral vascular disease, unspecified: Secondary | ICD-10-CM | POA: Diagnosis not present

## 2020-07-03 DIAGNOSIS — E782 Mixed hyperlipidemia: Secondary | ICD-10-CM

## 2020-07-03 DIAGNOSIS — I480 Paroxysmal atrial fibrillation: Secondary | ICD-10-CM | POA: Diagnosis not present

## 2020-07-03 DIAGNOSIS — I10 Essential (primary) hypertension: Secondary | ICD-10-CM | POA: Diagnosis not present

## 2020-07-03 NOTE — Patient Instructions (Addendum)
Medication Instructions:  Your physician has recommended you make the following change in your medication:  1. HOLD DOXAZOSIN FOR 5-7 DAYS. IF DIARRHEA DOESN'T STOP RESTART DOXAZOSIN AND FOLLOW UP WITH YOUR PRIMACY CARE  DOCTOR. IF DIARRHEA DOES CLEAR, TRY DOXAZOSIN AGAIN. IF RECURS PLEASE CALL OUR OFFICE.  *If you need a refill on your cardiac medications before your next appointment, please call your pharmacy*   Lab Work: NONE If you have labs (blood work) drawn today and your tests are completely normal, you will receive your results only by: [name] MyChart Message (if you have MyChart) OR . A paper copy in the mail If you have any lab test that is abnormal or we need to change your treatment, we will call you to review the results.   Testing/Procedures: NONE   Follow-Up: At [facility], you and your health needs are our priority.  As part of our continuing mission to provide you with exceptional heart care, we have created designated Provider Care Teams.  These Care Teams include your primary Cardiologist (physician) and Advanced Practice Providers (APPs -  Physician Assistants and Nurse Practitioners) who all work together to provide you with the care you need, when you need it.  We recommend signing up for the patient portal called "MyChart".  Sign up information is provided on this After Visit Summary.  MyChart is used to connect with patients for Virtual Visits (Telemedicine).  Patients are able to view lab/test results, encounter notes, upcoming appointments, etc.  Non-urgent messages can be sent to your provider as well.   To learn more about what you can do with MyChart, go to [email or web address].    Your next appointment:   6 month(s)  The format for your next appointment:   In Person  Provider:   You may see Lauree Chandler, MD or one of the following Advanced Practice Providers on your designated Care Team:    Melina Copa, PA-C  Ermalinda Barrios, PA-C

## 2020-07-27 ENCOUNTER — Other Ambulatory Visit: Payer: Self-pay | Admitting: Family Medicine

## 2020-07-28 ENCOUNTER — Other Ambulatory Visit: Payer: Self-pay | Admitting: Cardiovascular Disease

## 2020-07-30 ENCOUNTER — Encounter: Payer: Medicare Other | Admitting: Pharmacist

## 2020-07-30 ENCOUNTER — Telehealth: Payer: Self-pay

## 2020-07-30 NOTE — Progress Notes (Deleted)
HPI:  [name] is a 87 y.o. female patient of Dr Oval Linsey, with a [place] below who presents today for hypertension clinic follow up.  She noted that the patient's husband died in 2022/11/30 of Covid (married 25 years).  She is not interested in taking any more medications, feels as though she is on enough.    Past Medical History: PAF CHADS2-VASc 8 (no CHF) - on Eliquis 2.5 bid  ASCVD PAD - 2015 PCI to occluded R popliteal artery /stroke 3-4 per pt, no AF at time of last (2013)  anemia 1/22 - Hgb/hct 10.1/30.8  hypothyorid 1/22 - TSH 4.16 - on Armour Thyroid 15 mg  CKD Stage 3, GFR 26.7     Blood Pressure Goal:  130/80  Current Medications:  irbesartan 300 mg 1/2 tablet twice daily  chlorthalidone 25 mg daily (am)  hydralazine 25 mg three tomes daily (am, 5pm, 11pm)  doxazosin 1 mg at bedtime??  Social Hx: no tobacco, no alcohol, no regular caffeine  Diet: eats mostly home prepared foods, does not add salt  Exercise: has exercise bike, rides for 20 minutes every other day, has missed a few days recently; stretching exercise most days   Home BP readings: systolic read within 10 points       Intolerances: amlodipine - edema; beta blockers - bradycardia; hydralazine - weakness, nausea, ACEI - cough  Labs: 1/22:  Na 137, K 4.1, Glu 83, BUN 39, SCr 1.66, GFR 26.7   Wt Readings from Last 3 Encounters:  07/03/20 118 lb 12.8 oz (53.9 kg)  06/13/20 121 lb 3.2 oz (55 kg)  06/11/20 120 lb 9.6 oz (54.7 kg)   BP Readings from Last 3 Encounters:  07/03/20 138/70  06/13/20 139/64  06/11/20 (!) 128/54   Pulse Readings from Last 3 Encounters:  07/03/20 (!) 44  06/13/20 (!) 54  06/11/20 (!) 51    Current Outpatient Medications  Medication Sig Dispense Refill  . apixaban (ELIQUIS) 2.5 MG TABS tablet Take 1 tablet (2.5 mg total) by mouth 2 (two) times daily. 180 tablet 3  . ARMOUR THYROID 15 MG tablet TAKE 1 TABLET BY MOUTH DAILY. 90 tablet 1  . chlorthalidone (HYGROTON)  25 MG tablet TAKE 1 TABLET(25 MG) BY MOUTH DAILY 90 tablet 3  . Digestive Enzyme CAPS Take 1 capsule by mouth See admin instructions. Mix the contents of 1 capsule into water and drink with all meals    . dorzolamide-timolol (COSOPT) 22.3-6.8 MG/ML ophthalmic solution Place 1 drop into the left eye in the morning and at bedtime.     [name] doxazosin (CARDURA) 2 MG tablet Take 1 tablet (2 mg total) by mouth at bedtime. 90 tablet 3  . hydrALAZINE (APRESOLINE) 25 MG tablet TAKE 1 TABLET(25 MG) BY MOUTH THREE TIMES DAILY 270 tablet 3  . irbesartan (AVAPRO) 300 MG tablet Take 1 tablet (300 mg total) by mouth daily. 90 tablet 3  . metoprolol tartrate (LOPRESSOR) 25 MG tablet Take 0.5 tablets (12.5 mg total) by mouth daily as needed (HR >110 take only BID). 180 tablet 3  . Multiple Vitamin (MULTIVITAMIN) LIQD Take 30 mLs by mouth daily.    . Nutritional Supplements (FEEDING SUPPLEMENT, KATE FARMS STANDARD 1.4,) LIQD liquid Take 325 mLs by mouth 3 (three) times daily between meals.    . Papaya TABS Take 3 tablets by mouth 3 (three) times daily.     . Probiotic CAPS Take 1 capsule by mouth daily.     No  current facility-administered medications for this visit.    Allergies  Allergen Reactions  . Aceon [Perindopril Erbumine] Cough  . Amlodipine Other (See Comments)    Lower extremity discomfort  . Aspirin Other (See Comments) and Cough    STOPPED BY A MEDICAL PROFESSIONAL  . Azor [Amlodipine-Olmesartan] Cough  . Beta Adrenergic Blockers Other (See Comments)    Bradycardia  . Hydralazine Nausea And Vomiting and Other (See Comments)    Weakness- tolerating in 2021   . Milk-Related Compounds Other (See Comments) and Cough    STOPPED BY A MEDICAL PROFESSIONAL  . Other Other (See Comments) and Cough    NUTS- ALL TYPES- (STOPPED BY A MEDICAL PROFESSIONAL) NO FOODS THAT CAUSE GAS (will cause A-Fib)  . Peanut-Containing Drug Products Other (See Comments) and Cough    STOPPED BY A MEDICAL PROFESSIONAL  .  Sulfamethoxazole-Trimethoprim Other (See Comments)    Patient cannot specifically recall the reaction  . Vitamin D Analogs Cough    Past Medical History:  Diagnosis Date  . Arthritis   . Chicken pox   . Glaucoma of both eyes   . High cholesterol   . Hypertension    a. Normal renal arteries by duplex 2013.  [name] Hypothyroidism   . Migraine   . PAD (peripheral artery disease) ([place])    a. 04/2013: s/p PCI to occluded right popliteal artery   . PAF (paroxysmal atrial fibrillation) ([place])    a. s/p MAZE 2006. b. Event monitor 2013: NSR with PACs.  . Sinus bradycardia    a. 03/2011 during hospital admission - beta blocker stopped.  . Stroke [facility])    a. Pt reports 3-4 prior strokes without residual sx. b. CVA 2013 (presented with headache) - started on Plavix. Event monitor as outpt - no AF.    There were no vitals taken for this visit.  No problem-specific Assessment & Plan notes found for this encounter.   Kie Calvin Rodriguez-Guzman PharmD, BCPS, [place] [address] [place] [address] 07/30/2020 1:31 PM

## 2020-07-30 NOTE — Telephone Encounter (Signed)
lmom to r/s missed appt w/pharmd

## 2020-07-31 DIAGNOSIS — H401131 Primary open-angle glaucoma, bilateral, mild stage: Secondary | ICD-10-CM | POA: Diagnosis not present

## 2020-07-31 NOTE — Progress Notes (Signed)
This encounter was created in error - please disregard.

## 2020-08-08 ENCOUNTER — Ambulatory Visit (INDEPENDENT_AMBULATORY_CARE_PROVIDER_SITE_OTHER): Payer: Medicare Other | Admitting: Podiatry

## 2020-08-08 ENCOUNTER — Other Ambulatory Visit: Payer: Self-pay

## 2020-08-08 DIAGNOSIS — G5791 Unspecified mononeuropathy of right lower limb: Secondary | ICD-10-CM

## 2020-08-08 DIAGNOSIS — M25871 Other specified joint disorders, right ankle and foot: Secondary | ICD-10-CM

## 2020-08-08 MED ORDER — DEXAMETHASONE SODIUM PHOSPHATE 120 MG/30ML IJ SOLN
2.0000 mg | Freq: Once | INTRAMUSCULAR | Status: AC
  Administered 2020-08-08: 2 mg via INTRA_ARTICULAR

## 2020-08-10 NOTE — Progress Notes (Signed)
Subjective:  Patient ID: [name], female    DOB: 01/16/1926,  MRN: [id number] HPI Chief Complaint  Patient presents with   Bunions    Pt states she has right bunion pain within the last 2 weeks    87 y.o. female presents with the above complaint.   ROS: Denies fever chills nausea vomiting muscle aches pains calf pain back pain chest pain shortness of breath.  Past Medical History:  Diagnosis Date   Arthritis    Chicken pox    Glaucoma of both eyes    High cholesterol    Hypertension    a. Normal renal arteries by duplex 2013.   Hypothyroidism    Migraine    PAD (peripheral artery disease) ([place])    a. 04/2013: s/p PCI to occluded right popliteal artery    PAF (paroxysmal atrial fibrillation) ([place])    a. s/p MAZE 2006. b. Event monitor 2013: NSR with PACs.   Sinus bradycardia    a. 03/2011 during hospital admission - beta blocker stopped.   Stroke [facility])    a. Pt reports 3-4 prior strokes without residual sx. b. CVA 2013 (presented with headache) - started on Plavix. Event monitor as outpt - no AF.   Past Surgical History:  Procedure Laterality Date   CATARACT EXTRACTION Left 11/09/2017   LOWER EXTREMITY ANGIOGRAM N/A 05/15/2013   Procedure: LOWER EXTREMITY ANGIOGRAM;  Surgeon: Lorretta Harp, MD;  Location: [facility] CATH LAB;  Service: Cardiovascular;  Laterality: N/A;   MAZE  ~ 2006   POPLITEAL ARTERY STENT  05/15/2013    Current Outpatient Medications:    apixaban (ELIQUIS) 2.5 MG TABS tablet, Take 1 tablet (2.5 mg total) by mouth 2 (two) times daily., Disp: 180 tablet, Rfl: 3   ARMOUR THYROID 15 MG tablet, TAKE 1 TABLET BY MOUTH DAILY., Disp: 90 tablet, Rfl: 1   chlorthalidone (HYGROTON) 25 MG tablet, TAKE 1 TABLET(25 MG) BY MOUTH DAILY, Disp: 90 tablet, Rfl: 3   Digestive Enzyme CAPS, Take 1 capsule by mouth See admin instructions. Mix the contents of 1 capsule into water and drink with all meals, Disp: , Rfl:    dorzolamide-timolol (COSOPT) 22.3-6.8 MG/ML ophthalmic  solution, Place 1 drop into the left eye in the morning and at bedtime. , Disp: , Rfl:    doxazosin (CARDURA) 2 MG tablet, Take 1 tablet (2 mg total) by mouth at bedtime., Disp: 90 tablet, Rfl: 3   hydrALAZINE (APRESOLINE) 25 MG tablet, TAKE 1 TABLET(25 MG) BY MOUTH THREE TIMES DAILY, Disp: 270 tablet, Rfl: 3   irbesartan (AVAPRO) 300 MG tablet, Take 1 tablet (300 mg total) by mouth daily., Disp: 90 tablet, Rfl: 3   metoprolol tartrate (LOPRESSOR) 25 MG tablet, Take 0.5 tablets (12.5 mg total) by mouth daily as needed (HR >110 take only BID)., Disp: 180 tablet, Rfl: 3   Multiple Vitamin (MULTIVITAMIN) LIQD, Take 30 mLs by mouth daily., Disp: , Rfl:    Nutritional Supplements (FEEDING SUPPLEMENT, KATE FARMS STANDARD 1.4,) LIQD liquid, Take 325 mLs by mouth 3 (three) times daily between meals., Disp: , Rfl:    Papaya TABS, Take 3 tablets by mouth 3 (three) times daily. , Disp: , Rfl:    Probiotic CAPS, Take 1 capsule by mouth daily., Disp: , Rfl:   Allergies  Allergen Reactions   Aceon [Perindopril Erbumine] Cough   Amlodipine Other (See Comments)    Lower extremity discomfort   Aspirin Other (See Comments) and Cough    STOPPED BY A  MEDICAL PROFESSIONAL   Azor [Amlodipine-Olmesartan] Cough   Beta Adrenergic Blockers Other (See Comments)    Bradycardia   Hydralazine Nausea And Vomiting and Other (See Comments)    Weakness- tolerating in 2021    Milk-Related Compounds Other (See Comments) and Cough    STOPPED BY A MEDICAL PROFESSIONAL   Other Other (See Comments) and Cough    NUTS- ALL TYPES- (STOPPED BY A MEDICAL PROFESSIONAL) NO FOODS THAT CAUSE GAS (will cause A-Fib)   Peanut-Containing Drug Products Other (See Comments) and Cough    STOPPED BY A MEDICAL PROFESSIONAL   Sulfamethoxazole-Trimethoprim Other (See Comments)    Patient cannot specifically recall the reaction   Vitamin D Analogs Cough   Review of Systems Objective:  There were no vitals filed for this visit.  General:  Well developed, nourished, in no acute distress, alert and oriented x3   Dermatological: Skin is warm, dry and supple bilateral. Nails x 10 are well maintained; remaining integument appears unremarkable at this time. There are no open sores, no preulcerative lesions, no rash or signs of infection present.  Vascular: Dorsalis Pedis artery and Posterior Tibial artery pedal pulses are 2/4 bilateral with immedate capillary fill time. Pedal hair growth present. No varicosities and no lower extremity edema present bilateral.   Neruologic: Grossly intact via light touch bilateral. Vibratory intact via tuning fork bilateral. Protective threshold with Semmes Wienstein monofilament intact to all pedal sites bilateral. Patellar and Achilles deep tendon reflexes 2+ bilateral. No Babinski or clonus noted bilateral.   Musculoskeletal: No gross boney pedal deformities bilateral. No pain, crepitus, or limitation noted with foot and ankle range of motion bilateral. Muscular strength 5/5 in all groups tested bilateral.  Palpable pain along the plantar medial aspect of the first metatarsophalangeal joint right foot.  Mild to moderate hallux valgus.  Gait: Unassisted, Nonantalgic.    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  At this point I injected a small amount of dexamethasone and local anesthetic to the point of maximal tenderness just proximal to the tibial sesamoid along the palpable plantar medial nerve.  We discussed appropriate shoe gear.     Chasen [place], [place]

## 2020-09-17 ENCOUNTER — Other Ambulatory Visit: Payer: Self-pay

## 2020-09-17 ENCOUNTER — Ambulatory Visit (INDEPENDENT_AMBULATORY_CARE_PROVIDER_SITE_OTHER): Payer: Medicare Other | Admitting: Family Medicine

## 2020-09-17 ENCOUNTER — Encounter: Payer: Self-pay | Admitting: Family Medicine

## 2020-09-17 VITALS — BP 127/68 | HR 51 | Temp 98.2°F | Wt 118.4 lb

## 2020-09-17 DIAGNOSIS — G8929 Other chronic pain: Secondary | ICD-10-CM | POA: Diagnosis not present

## 2020-09-17 DIAGNOSIS — N183 Chronic kidney disease, stage 3 unspecified: Secondary | ICD-10-CM

## 2020-09-17 DIAGNOSIS — I10 Essential (primary) hypertension: Secondary | ICD-10-CM | POA: Diagnosis not present

## 2020-09-17 DIAGNOSIS — E039 Hypothyroidism, unspecified: Secondary | ICD-10-CM | POA: Diagnosis not present

## 2020-09-17 DIAGNOSIS — I480 Paroxysmal atrial fibrillation: Secondary | ICD-10-CM | POA: Diagnosis not present

## 2020-09-17 DIAGNOSIS — M25512 Pain in left shoulder: Secondary | ICD-10-CM | POA: Diagnosis not present

## 2020-09-17 NOTE — Progress Notes (Signed)
Phone [phone] In person visit   Subjective:   [name] is a 87 y.o. year old very pleasant female patient who presents for/with See problem oriented charting Chief Complaint  Patient presents with   Hyperlipidemia   Hypothyroidism   Hypertension   Atrial Fibrillation   Arm Pain    Left arm, pain started about a month ago. Radiates into her neck and shoulder    Diarrhea    Twice daily for "quite a while, maybe a month" States she has random accidents without knowing about them    This visit occurred during the SARS-CoV-2 public health emergency.  Safety protocols were in place, including screening questions prior to the visit, additional usage of staff PPE, and extensive cleaning of exam room while observing appropriate contact time as indicated for disinfecting solutions.   Past Medical History-  Patient Active Problem List   Diagnosis Date Noted   COVID-19 10/12/2019    Priority: High   Chronic cough 08/12/2015    Priority: High   PAD (peripheral artery disease) ([place]) 05/10/2013    Priority: High   PAF (paroxysmal atrial fibrillation) ([place])     Priority: High   History of stroke     Priority: High   CKD (chronic kidney disease), stage III 06/15/2018    Priority: Medium   Hyperglycemia 06/25/2014    Priority: Medium   Hypothyroidism 06/07/2014    Priority: Medium   Hyperlipidemia 06/07/2014    Priority: Medium   Hypertension     Priority: Medium   GERD (gastroesophageal reflux disease) 06/25/2014    Priority: Low   Vitamin D deficiency 06/25/2014    Priority: Low   Intracervical pessary 06/07/2014    Priority: Low   Sinus bradycardia 05/19/2013    Priority: Low   PAC (premature atrial contraction) 05/20/2011    Priority: Low   Anemia of chronic disease 12/14/2019   Low vitamin B12 level 12/09/2017   Anemia 08/16/2017   Perennial allergic rhinitis 08/12/2015    Medications- reviewed and updated Current Outpatient Medications  Medication Sig  Dispense Refill   apixaban (ELIQUIS) 2.5 MG TABS tablet Take 1 tablet (2.5 mg total) by mouth 2 (two) times daily. 180 tablet 3   ARMOUR THYROID 15 MG tablet TAKE 1 TABLET BY MOUTH DAILY. 90 tablet 1   chlorthalidone (HYGROTON) 25 MG tablet TAKE 1 TABLET(25 MG) BY MOUTH DAILY 90 tablet 3   Digestive Enzyme CAPS Take 1 capsule by mouth See admin instructions. Mix the contents of 1 capsule into water and drink with all meals     dorzolamide-timolol (COSOPT) 22.3-6.8 MG/ML ophthalmic solution Place 1 drop into the left eye in the morning and at bedtime.      doxazosin (CARDURA) 2 MG tablet Take 1 tablet (2 mg total) by mouth at bedtime. 90 tablet 3   hydrALAZINE (APRESOLINE) 25 MG tablet TAKE 1 TABLET(25 MG) BY MOUTH THREE TIMES DAILY 270 tablet 3   irbesartan (AVAPRO) 300 MG tablet Take 1 tablet (300 mg total) by mouth daily. 90 tablet 3   Multiple Vitamin (MULTIVITAMIN) LIQD Take 30 mLs by mouth daily.     Nutritional Supplements (FEEDING SUPPLEMENT, KATE FARMS STANDARD 1.4,) LIQD liquid Take 325 mLs by mouth 3 (three) times daily between meals.     Papaya TABS Take 3 tablets by mouth 3 (three) times daily.      Probiotic CAPS Take 1 capsule by mouth daily.     metoprolol tartrate (LOPRESSOR) 25 MG tablet Take 0.5  tablets (12.5 mg total) by mouth daily as needed (HR >110 take only BID). 180 tablet 3   No current facility-administered medications for this visit.     Objective:  BP 127/68   Pulse (!) 51   Temp 98.2 F (36.8 C) (Temporal)   Wt 118 lb 6.4 oz (53.7 kg)   SpO2 100%   BMI 20.97 kg/m  Gen: NAD, resting comfortably, no cough during visit CV: bradycardic but regular no murmurs rubs or gallops Lungs: CTAB no crackles, wheeze, rhonchi Abdomen: soft/nontender/nondistended/normal bowel sounds. No rebound or guarding.  Ext: minimal  edema Skin: warm, dry Msk: range of motion in left shoulder by abduction only 90 degrees without assist- significant pain noted    Assessment and  Plan  # Left shoulder pain S:Patient reports that she is experiencing arm pain. Starts in left neck and goes into shoulder and upper arm.  Several months of issues A/P: on exam not able to lift arm past 90 degrees- concern for frozen shoulder- could also have some cervical disc issues with pain into left neck but will start with PT for left shoulder pain. She would like to hold off on sports med/potential injections for now.   - she is also interested in potentially seeing chiropractor Dr. Milus Glazier - they will call in if need referral  # Diarrhea S:Patient reports that lately she has been experiencing a lot of diarrhea. At least 2 times a day. She cut back on [employer] supplement but that does not help and another digestiive enzymes. She is concerned could be her doxazosin 1 mg  A/P: see avs and discussion below about HTN- since patient is concerned could be specific BP med- short term trial off is reasonable   #hypertension S: medication: chlorthalidone 25 mg, metoprolol 25 mg BID ONLY if HR gets high, Irbesartan 300 mg- half AM and half PM,  doxazosin only taking half tablet so 1 mg, hydralazine 25 mg TID- with 2 pm for second dose BP Readings from Last 3 Encounters:  09/17/20 127/68  07/03/20 138/70  06/13/20 139/64  A/P: Blood pressure looks excellent today-doxazosin through Dr. Oval Linsey seems to be very helpful.  On the other hand she is concerned this could be causing diarrhea.  We opted to do a 3-day trial off of medication to see how blood pressure does and if diarrhea improves-I asked her to update me next week with how her trial went  # Atrial fibrillation S: Rate controlled with metoprolol 12.5 mg BID only if needed- last HR elevation November 2021 Anticoagulated with Eliquis 2.5 mg BID  A/P: Appropriately anticoagulated and rate controlled-continue current medication  #hyperlipidemia/statin intolerance history #history of CVA/PAD (used to be on Plavix now on Eliquis  alone-arterial duplex followed by Dr. Gwenlyn Found last September 2021 with plan for repeat in 12 months) S: Medication:none. Patient has declined even trialing rosuvastatin 5 mg once a week Lab Results  Component Value Date   CHOL 225 (H) 12/06/2019   HDL 67 12/06/2019   LDLCALC 133 (H) 12/06/2019   TRIG 141 12/06/2019   CHOLHDL 3.4 12/06/2019   A/P: poor control with LDL GOAL under 70 and levels >130. Patient statin intolerant and not willing to retrial based on prior discussions  #hypothyroidism S: compliant On thyroid medication- Armoour Thyroid 15 mg  Lab Results  Component Value Date   TSH 4.34 06/13/2020   A/P: hopefully stable- update TSH today. Continue current meds for now  #CKD III- after COVID-19 patients kidney function  appeared to progress to chronic kidney disease stage IV - we did suggest referral to nephrology after a prior visit but patient wanted to discuss with family. Son with her today- patient still declining referral. Our lab was closed at time of visit but we will update in next few weeks and if worsens will advocate again- hoping GFR has improved though or at least stable with better BP control  #Vitamin D deficiency S: Medication: cough on vitamin D in past but states having some cough already now so willing to trial again Last vitamin D Lab Results  Component Value Date   VD25OH 27.83 (L) 06/13/2020   A/P: trial 5k units daily for 2 weeks and hopefully will improve- if cough doesn't worsen recommended long term vitamin D    #Anemia- some of patient's anemia could be from CKD. She did want to do bone marrow biopsy unless levels were worsening nor has her oncologist. There was consideration for aranesp- evaluation was pending with nephrology if she was willing. We held off on referral back to oncology unless hgb worsens or significant worsening in white count- chronic for her -will update CBC with labs in a few weeks- may need to reconsider if symptoms worsen Lab  Results  Component Value Date   WBC 3.0 (L) 06/13/2020   HGB 9.7 (L) 06/13/2020   HCT 29.6 (L) 06/13/2020   MCV 81.9 06/13/2020   PLT 157.0 06/13/2020   # Mourning/Depressed mood S: Medication:none and patient wants to minimize meds.  Patient feels she gets out of the house more to feel better-she signed up for a tai chi class but has not been attending-she did this before COVID-19 pandemic.  She lost her husband last September to covid-19 and this has been a challenging time A/P: Patient with some depressed mood-she is not interested in medication at this time.  We discussed hospice grief counseling but she declines for now.  Also not interested in individual therapy -signed up for tai chi that was doing precovid-we set a smart goal of attending at least 1 day a week out of possible 2 for 4 weeks during the month of August-I am going to try to check in with her next month to see how she is progressing    # Hyperglycemia-last A1c was 6.0 in [place] soon for repeat at this time  Recommended follow up: Return in about 3 months (around 12/18/2020) for follow up- or sooner if needed.  Lab/Order associations:   ICD-10-CM   1. Chronic left shoulder pain  M25.512 Ambulatory referral to Physical Therapy   G89.29     2. Hypothyroidism, unspecified type  E03.9 TSH    3. Primary hypertension  I10 CBC with Differential/Platelet    Comprehensive metabolic panel    4. Stage 3 chronic kidney disease, unspecified whether stage 3a or 3b CKD (HCC)  N18.30     Possibly CKD stage IV   I,Harris Phan,acting as a scribe for Garret Reddish, MD.,have documented all relevant documentation on the behalf of Garret Reddish, MD,as directed by  Garret Reddish, MD while in the presence of Garret Reddish, MD.  I, Garret Reddish, MD, have reviewed all documentation for this visit. The documentation on 09/17/20 for the exam, diagnosis, procedures, and orders are all accurate and complete.   Return precautions  advised.  Garret Reddish, MD

## 2020-09-17 NOTE — Patient Instructions (Addendum)
Please schedule a PT visit at front desk before you leave today. If you would instead like a referral to Dr. Milus Glazier, please call back and I can place one for you.  Trial off doxazosin 2 mg (actually 1 mg since you take half tablet) for 3 days. Please update with me next week on how your blood pressure is doing and if your diarrhea improves.  Please trial vitamin D 5000 units daily for 2 weeks at least. If it does not make your cough worse, you may continue.  Recommended follow-up: 3 month follow up  Schedule labs within 3 weeks to check on kidney function and anemia and thyroid

## 2020-10-07 ENCOUNTER — Encounter: Payer: Self-pay | Admitting: Family Medicine

## 2020-10-08 ENCOUNTER — Other Ambulatory Visit: Payer: Self-pay | Admitting: Family Medicine

## 2020-10-09 ENCOUNTER — Other Ambulatory Visit (INDEPENDENT_AMBULATORY_CARE_PROVIDER_SITE_OTHER): Payer: Medicare Other

## 2020-10-09 ENCOUNTER — Other Ambulatory Visit: Payer: Self-pay

## 2020-10-09 DIAGNOSIS — I10 Essential (primary) hypertension: Secondary | ICD-10-CM

## 2020-10-09 DIAGNOSIS — E039 Hypothyroidism, unspecified: Secondary | ICD-10-CM

## 2020-10-09 LAB — COMPREHENSIVE METABOLIC PANEL
ALT: 8 U/L (ref 0–35)
AST: 14 U/L (ref 0–37)
Albumin: 3.8 g/dL (ref 3.5–5.2)
Alkaline Phosphatase: 55 U/L (ref 39–117)
BUN: 44 mg/dL — ABNORMAL HIGH (ref 6–23)
CO2: 26 mEq/L (ref 19–32)
Calcium: 9.1 mg/dL (ref 8.4–10.5)
Chloride: 106 mEq/L (ref 96–112)
Creatinine, Ser: 1.61 mg/dL — ABNORMAL HIGH (ref 0.40–1.20)
GFR: 27.57 mL/min — ABNORMAL LOW (ref >60.00)
Glucose, Bld: 119 mg/dL — ABNORMAL HIGH (ref 70–99)
Potassium: 3.8 mEq/L (ref 3.5–5.1)
Sodium: 140 mEq/L (ref 135–145)
Total Bilirubin: 0.5 mg/dL (ref 0.2–1.2)
Total Protein: 6.4 g/dL (ref 6.0–8.3)

## 2020-10-09 LAB — CBC WITH DIFFERENTIAL/PLATELET
Basophils Absolute: 0 10*3/uL (ref 0.0–0.1)
Basophils Relative: 0.4 % (ref 0.0–3.0)
Eosinophils Absolute: 0.1 10*3/uL (ref 0.0–0.7)
Eosinophils Relative: 1.7 % (ref 0.0–5.0)
HCT: 28.6 % — ABNORMAL LOW (ref 36.0–46.0)
Hemoglobin: 9.4 g/dL — ABNORMAL LOW (ref 12.0–15.0)
Lymphocytes Relative: 25.7 % (ref 12.0–46.0)
Lymphs Abs: 0.9 10*3/uL (ref 0.7–4.0)
MCHC: 32.8 g/dL (ref 30.0–36.0)
MCV: 82.4 fl (ref 78.0–100.0)
Monocytes Absolute: 0.4 10*3/uL (ref 0.1–1.0)
Monocytes Relative: 12.4 % — ABNORMAL HIGH (ref 3.0–12.0)
Neutro Abs: 2.1 10*3/uL (ref 1.4–7.7)
Neutrophils Relative %: 59.8 % (ref 43.0–77.0)
Platelets: 152 10*3/uL (ref 150.0–400.0)
RBC: 3.47 Mil/uL — ABNORMAL LOW (ref 3.87–5.11)
RDW: 14.8 % (ref 11.5–15.5)
WBC: 3.5 10*3/uL — ABNORMAL LOW (ref 4.0–10.5)

## 2020-10-09 LAB — TSH: TSH: 4.65 u[IU]/mL (ref 0.35–5.50)

## 2020-10-23 ENCOUNTER — Telehealth: Payer: Self-pay | Admitting: Cardiovascular Disease

## 2020-10-23 NOTE — Telephone Encounter (Signed)
   Pt requesting to speak with a nurse about her issue with her leg, she wanted to see Dr. Gwenlyn Found to check it and she said it should be sooner than December

## 2020-10-23 NOTE — Telephone Encounter (Signed)
The patient stated that she has developed right leg pain when ambulating. She stated that it sometimes hurts when she is sitting but mainly when she ambulates The pain is around her knee area and up to her thigh. She denies any discoloration, swelling, temperature change and it does not hurt when she presses on it.  She is due for her yearly lower extremity doppler and was last seen by Dr. Gwenlyn Found in 2019. A message has been sent to scheduling to have these scheduled.

## 2020-10-24 ENCOUNTER — Ambulatory Visit (INDEPENDENT_AMBULATORY_CARE_PROVIDER_SITE_OTHER): Payer: Medicare Other | Admitting: Physical Therapy

## 2020-10-24 ENCOUNTER — Encounter: Payer: Self-pay | Admitting: Physical Therapy

## 2020-10-24 ENCOUNTER — Other Ambulatory Visit: Payer: Self-pay

## 2020-10-24 DIAGNOSIS — G8929 Other chronic pain: Secondary | ICD-10-CM

## 2020-10-24 DIAGNOSIS — M25512 Pain in left shoulder: Secondary | ICD-10-CM | POA: Diagnosis not present

## 2020-10-24 NOTE — Patient Instructions (Signed)
Access Code: [email or web address] URL: [email or web address] Date: 10/24/2020 Prepared by: Lyndee Hensen  Exercises Supine Shoulder Flexion Extension AAROM with Dowel - 1-2 x daily - 1 sets - 10 reps - 5 hold Supine Chest Stretch with Elbows Bent - 1 x daily - 1 sets - 10 reps - 5 hold Seated Bilateral Shoulder External Rotation with Resistance - 1 x daily - 1-2 sets - 10 reps

## 2020-10-29 ENCOUNTER — Encounter (HOSPITAL_COMMUNITY): Payer: Medicare Other

## 2020-10-30 ENCOUNTER — Ambulatory Visit (HOSPITAL_COMMUNITY)
Admission: RE | Admit: 2020-10-30 | Discharge: 2020-10-30 | Disposition: A | Discharge status: Home / Self Care | Payer: Medicare Other | Source: Ambulatory Visit | Attending: Internal Medicine | Admitting: Internal Medicine

## 2020-10-30 ENCOUNTER — Other Ambulatory Visit: Payer: Self-pay

## 2020-10-30 DIAGNOSIS — I739 Peripheral vascular disease, unspecified: Secondary | ICD-10-CM | POA: Diagnosis not present

## 2020-10-31 ENCOUNTER — Other Ambulatory Visit (HOSPITAL_BASED_OUTPATIENT_CLINIC_OR_DEPARTMENT_OTHER): Payer: Self-pay

## 2020-10-31 DIAGNOSIS — I739 Peripheral vascular disease, unspecified: Secondary | ICD-10-CM

## 2020-11-07 ENCOUNTER — Encounter: Payer: Medicare Other | Admitting: Physical Therapy

## 2020-12-03 DIAGNOSIS — N816 Rectocele: Secondary | ICD-10-CM | POA: Diagnosis not present

## 2020-12-03 DIAGNOSIS — N8111 Cystocele, midline: Secondary | ICD-10-CM | POA: Diagnosis not present

## 2020-12-23 NOTE — Telephone Encounter (Signed)
This encounter was created in error - please disregard.

## 2021-01-07 ENCOUNTER — Ambulatory Visit (INDEPENDENT_AMBULATORY_CARE_PROVIDER_SITE_OTHER): Payer: Medicare Other | Admitting: Family Medicine

## 2021-01-07 NOTE — Progress Notes (Signed)
No-show-patient planned to reschedule

## 2021-01-07 NOTE — Patient Instructions (Addendum)
.   Health Maintenance Due  Topic Date Due   Pneumonia Vaccine 75+ Years old (1 - PCV) -  Never done   Zoster Vaccines- Shingrix (1 of 2)  - Never done   INFLUENZA VACCINE  -  Never done    Recommended follow up: No follow-ups on file.

## 2021-01-10 ENCOUNTER — Other Ambulatory Visit: Payer: Self-pay

## 2021-01-10 MED ORDER — HYDRALAZINE HCL 25 MG PO TABS: ORAL_TABLET | ORAL | 1 refills | Status: DC

## 2021-01-14 ENCOUNTER — Encounter: Payer: Self-pay | Admitting: Family Medicine

## 2021-01-14 ENCOUNTER — Other Ambulatory Visit (HOSPITAL_BASED_OUTPATIENT_CLINIC_OR_DEPARTMENT_OTHER): Payer: Self-pay | Admitting: Cardiovascular Disease

## 2021-01-14 ENCOUNTER — Other Ambulatory Visit: Payer: Self-pay

## 2021-01-14 ENCOUNTER — Ambulatory Visit (INDEPENDENT_AMBULATORY_CARE_PROVIDER_SITE_OTHER): Payer: Medicare Other | Admitting: Family Medicine

## 2021-01-14 VITALS — BP 138/60 | HR 52 | Temp 98.1°F | Ht 63.0 in | Wt 120.5 lb

## 2021-01-14 DIAGNOSIS — N183 Chronic kidney disease, stage 3 unspecified: Secondary | ICD-10-CM | POA: Diagnosis not present

## 2021-01-14 DIAGNOSIS — E559 Vitamin D deficiency, unspecified: Secondary | ICD-10-CM | POA: Diagnosis not present

## 2021-01-14 DIAGNOSIS — R739 Hyperglycemia, unspecified: Secondary | ICD-10-CM

## 2021-01-14 DIAGNOSIS — E039 Hypothyroidism, unspecified: Secondary | ICD-10-CM | POA: Diagnosis not present

## 2021-01-14 DIAGNOSIS — E785 Hyperlipidemia, unspecified: Secondary | ICD-10-CM

## 2021-01-14 DIAGNOSIS — M542 Cervicalgia: Secondary | ICD-10-CM | POA: Diagnosis not present

## 2021-01-14 DIAGNOSIS — I10 Essential (primary) hypertension: Secondary | ICD-10-CM

## 2021-01-14 LAB — CBC WITH DIFFERENTIAL/PLATELET
Basophils Absolute: 0 10*3/uL (ref 0.0–0.1)
Basophils Relative: 0.2 % (ref 0.0–3.0)
Eosinophils Absolute: 0.1 10*3/uL (ref 0.0–0.7)
Eosinophils Relative: 1.4 % (ref 0.0–5.0)
HCT: 30.2 % — ABNORMAL LOW (ref 36.0–46.0)
Hemoglobin: 10 g/dL — ABNORMAL LOW (ref 12.0–15.0)
Lymphocytes Relative: 22.6 % (ref 12.0–46.0)
Lymphs Abs: 0.8 10*3/uL (ref 0.7–4.0)
MCHC: 33.1 g/dL (ref 30.0–36.0)
MCV: 82.1 fl (ref 78.0–100.0)
Monocytes Absolute: 0.6 10*3/uL (ref 0.1–1.0)
Monocytes Relative: 15.9 % — ABNORMAL HIGH (ref 3.0–12.0)
Neutro Abs: 2.1 10*3/uL (ref 1.4–7.7)
Neutrophils Relative %: 59.9 % (ref 43.0–77.0)
Platelets: 160 10*3/uL (ref 150.0–400.0)
RBC: 3.68 Mil/uL — ABNORMAL LOW (ref 3.87–5.11)
RDW: 14.4 % (ref 11.5–15.5)
WBC: 3.5 10*3/uL — ABNORMAL LOW (ref 4.0–10.5)

## 2021-01-14 LAB — HEMOGLOBIN A1C: Hgb A1c MFr Bld: 6 % (ref 4.6–6.5)

## 2021-01-14 MED ORDER — CHLORTHALIDONE 25 MG PO TABS: 25.0000 mg | ORAL_TABLET | Freq: Every day | ORAL | 0 refills | Status: DC

## 2021-01-14 MED ORDER — CHLORTHALIDONE 25 MG PO TABS: 25.0000 mg | ORAL_TABLET | Freq: Every day | ORAL | 1 refills | Status: DC

## 2021-01-14 MED ORDER — DOXAZOSIN MESYLATE 2 MG PO TABS: 1.0000 mg | ORAL_TABLET | Freq: Every day | ORAL | 1 refills | Status: DC

## 2021-01-14 NOTE — Telephone Encounter (Signed)
Received request for refill of Doxazosin 2 mg via fax from [facility].   On chart review, noticed patient has been taking 1/2 tab daily. She had been having issues with diarrhea starting in May 2022 and Doxazosin was held for 5-7 days. She saw Dr. Yong Channel soon after and Doxazosin was held again for 3 days.   Spoke to pt today. She stated she has soft stools and has a hard time holding stools. She stated when she has to go, she needs to get to the bathroom right away. She stated she does not believe it is diarrhea, just soft and control of holding it is weakened. She stated she is still taking the Doxazosin but has reduced it to 1/2 tab (1 mg) daily.  Pt stated she also needed refill of Chlorthalidone 25 mg because she will run out this week. Sent 30 day Rx to [facility] and 90 day to [facility].  Sending info concerning Doxazosin to Dr. Oval Linsey to advise of ok to fill for 1/2 tab daily.

## 2021-01-14 NOTE — Progress Notes (Signed)
Phone [phone] In person visit   Subjective:   [name] is a 87 y.o. year old very pleasant female patient who presents for/with See problem oriented charting Chief Complaint  Patient presents with   Hypertension   Wrist Pain    Both x 3 week    This visit occurred during the SARS-CoV-2 public health emergency.  Safety protocols were in place, including screening questions prior to the visit, additional usage of staff PPE, and extensive cleaning of exam room while observing appropriate contact time as indicated for disinfecting solutions.   Past Medical History-  Patient Active Problem List   Diagnosis Date Noted   COVID-19 10/12/2019    Priority: High   Chronic cough 08/12/2015    Priority: High   PAD (peripheral artery disease) ([place]) 05/10/2013    Priority: High   PAF (paroxysmal atrial fibrillation) ([place])     Priority: High   History of stroke     Priority: High   CKD (chronic kidney disease), stage III 06/15/2018    Priority: Medium    Hyperglycemia 06/25/2014    Priority: Medium    Hypothyroidism 06/07/2014    Priority: Medium    Hyperlipidemia 06/07/2014    Priority: Medium    Hypertension     Priority: Medium    GERD (gastroesophageal reflux disease) 06/25/2014    Priority: Low   Vitamin D deficiency 06/25/2014    Priority: Low   Intracervical pessary 06/07/2014    Priority: Low   Sinus bradycardia 05/19/2013    Priority: Low   PAC (premature atrial contraction) 05/20/2011    Priority: Low   Anemia of chronic disease 12/14/2019   Low vitamin B12 level 12/09/2017   Anemia 08/16/2017   Perennial allergic rhinitis 08/12/2015    Medications- reviewed and updated Current Outpatient Medications  Medication Sig Dispense Refill   ARMOUR THYROID 15 MG tablet TAKE 1 TABLET BY MOUTH DAILY. 90 tablet 1   chlorthalidone (HYGROTON) 25 MG tablet Take 1 tablet (25 mg total) by mouth daily. 90 tablet 1   Digestive Enzyme CAPS Take 1 capsule by mouth See  admin instructions. Mix the contents of 1 capsule into water and drink with all meals     dorzolamide-timolol (COSOPT) 22.3-6.8 MG/ML ophthalmic solution Place 1 drop into the left eye in the morning and at bedtime.      doxazosin (CARDURA) 2 MG tablet Take 1 tablet (2 mg total) by mouth at bedtime. 90 tablet 3   ELIQUIS 2.5 MG TABS tablet TAKE 1 TABLET(2.5 MG) BY MOUTH TWICE DAILY 180 tablet 3   hydrALAZINE (APRESOLINE) 25 MG tablet TAKE 1 TABLET(25 MG) BY MOUTH THREE TIMES DAILY 270 tablet 1   irbesartan (AVAPRO) 300 MG tablet Take 1 tablet (300 mg total) by mouth daily. 90 tablet 3   metoprolol tartrate (LOPRESSOR) 25 MG tablet Take 0.5 tablets (12.5 mg total) by mouth daily as needed (HR >110 take only BID). 180 tablet 3   Multiple Vitamin (MULTIVITAMIN) LIQD Take 30 mLs by mouth daily.     Nutritional Supplements (FEEDING SUPPLEMENT, KATE FARMS STANDARD 1.4,) LIQD liquid Take 325 mLs by mouth 3 (three) times daily between meals.     Papaya TABS Take 3 tablets by mouth 3 (three) times daily.      Probiotic CAPS Take 1 capsule by mouth daily.     No current facility-administered medications for this visit.     Objective:  BP 138/60   Pulse (!) 52   Temp 98.1  F (36.7 C)   Ht '5\' 3"'  (1.6 m)   Wt 120 lb 8 oz (54.7 kg)   SpO2 100%   BMI 21.35 kg/m  Gen: NAD, resting comfortably No pain palpable along neck CV: Bradycardic no murmurs rubs or gallops Lungs: CTAB no crackles, wheeze, rhonchi Ext: no edema Skin: warm, dry Neuro: grossly normal, moves all extremities   EKG: sinus bradycardia with rate 47, normal axis, normal intervals other than prolonged QRS, no hypertrophy, incomplete right bundel branch block, multiple inverted t waves- appears stable from EKG May 10 2020  other than V2 appears new otherwise no st or t wave changes     Assessment and Plan   #hypertension S: medication: chlorthalidone 25 mg, metoprolol 12. mg BID only if HR gets high,  Irbesartan 300 mg- half in  AM and half in PM, and hydralazine 25 mg TID - second dose in 2 PM, doxazosin 2 mg-she has been concerned could be causing diarrhea last visit - Doxazosin was stopped for trial due to diarrhea concern- ultimtaely diarrhea got better even though restarting- more of very soft stool Home readings #s: anywhere from 130 to 150 if takes medicine BP Readings from Last 3 Encounters:  01/14/21 138/60  09/17/20 127/68  07/03/20 138/70  A/P: Controlled. Continue current medications.    # Atrial fibrillation S: Rate controlled with metoprolol 12.5 mg BID only if needed- last HR elevation November 2021 Anticoagulated with Eliquis 2.5 mg BID  A/P: Appropriately anticoagulated and rate controlled-continue current medication  #hyperlipidemia #history of CVA/PAD (used to be on Plavix now on Eliquis alone-arterial duplex followed by Dr. Gwenlyn Found last September 2022 with plan for repeat in 12 months) S: Medication:none - patient had declined even trialing rosuvastatin 5 mg in the past-still declining now again today  Lab Results  Component Value Date   CHOL 225 (H) 12/06/2019   HDL 67 12/06/2019   LDLCALC 133 (H) 12/06/2019   TRIG 141 12/06/2019   CHOLHDL 3.4 12/06/2019   A/P: with cva/pad history prefer LDL under 70- she prefers to remain off statin. willcontinue eliquis which also helps with a fib stroke prevention  -she does mention today with stress of prepping for holidays can get some left neck pain for a minute or so- ekg today under left neck pain (no sob or chest pain or diaphoresis) . EKG appears largely stable and could all be stress related with thanksgiving and loss of husband a year ago- but I still think itd be reasonable/advisable to schedule cardiology follow up   #hypothyroidism S: compliant On thyroid medication-Armour Thyroid 15 mg  Lab Results  Component Value Date   TSH 4.65 10/09/2020   A/P:Hopefully stable-update TSH with labs today-for now continue current medication  #Chronic  kidney disease stage III S: After COVID-19 patients kidney function appeared to progress to chronic kidney disease stage IV - we did suggest referral to nephrology after a prior visit but patient wanted to discuss with family.  A/P: update today- - open if GFR still under 30 to see nephrology after the holidays   #Vitamin D deficiency S: Medication: 5k units of Vitamin D for 2 weeks recommended - had issues coughing in the past (coughing was possibly worse form her baseline) . Not taking vitamin D Last vitamin D Lab Results  Component Value Date   VD25OH 27.83 (L) 06/13/2020   A/P:hopefully stable- likely needs long term vitamin D 5k units per day- update with labs   # Anemia-  some of  patient's anemia could have been from CKD. She declined bone marrow biopsy unless levels were worsening nor has her oncologist. There was consideration for aranesp- evaluation was pending with nephrology if she was willing. We held off on referral back to oncology last visit unless hgb worsens or significant worsening in white count- chronic for her -today update CBC again- consider nephrology referral Lab Results  Component Value Date   WBC 3.5 (L) 10/09/2020   HGB 9.4 (L) 10/09/2020   HCT 28.6 (L) 10/09/2020   MCV 82.4 10/09/2020   PLT 152.0 10/09/2020    # Mourning/Depressed mood S:Medications: None at present - patient wanted to minimize meds.  She felt that she could get out the house more and that it would be beneficial for her.  - last visit patient had signed up for [facility] but was not attending - I also messaged her on 10/07/2020 asking is she had been going but did not receive a response- she states went for about a month- then has dropped back with travel- plans to restart first of year.  - lost husband to COVID-19 back in September 2020 and that had been a challenging time for her. - Also discussed hospice grief counseling and individual therapy but patient was not interested at last  visit and declined. Did not discuss again today A/P:  Today patient reports continuing to improve- wants to continue without medicine and monitor  # Hyperglycemia-last A1c was 6.0 in April-too soon for repeat during 09/17/2020 visit-update A1c with labs today Lab Results  Component Value Date   HGBA1C 6.0 06/13/2020   HGBA1C 6.2 10/11/2018   HGBA1C 6.2 11/12/2016   # Left Shoulder Pain S:Last visit patient reported that she was experiencing arm pain - started in left neck and radiated to her shoulder and upper arm. This had been an ongoing issue. - on previous exam patient was not able to lift her arm up past 90 degrees - I was concerned for frozen shoulder or potential cervical disc issues. - patient was seen by Lyndee Hensen, PT for out patient rehab where she was given at home exercises to trial- she has done better - also wanted to hold off on Sports Med/potential injections last visit - she is not at point where she wants to see them  - she was also interested in follow-up with chiropractor Dr. Milus Glazier - instructed patient that is she was still interested to let us know- she didn't see him A/P: Today patient reports doing better with home exercises- will monitor  #Bilateral wrist pain S:if moves wrist in certain position over last month can get up to 9/10 pain in lateral wrist  (more on left) and down into the thumb- sharp and goes away quickly. Can happen on the right hand as well but less frequent and less severe . Only lasts a second or two A/P: not bad enough she wants to investigate further- monitor for now - let me know if worseningan  Recommended follow up: No follow-ups on file. Future Appointments  Date Time Provider [place]  03/14/2021  3:00 PM Burnell Blanks, MD [id number]    Lab/Order associations:   ICD-10-CM   1. Hypothyroidism, unspecified type  E03.9 TSH    2. Primary hypertension  I10 CBC with Differential/Platelet     Comprehensive metabolic panel    Lipid panel    3. Hyperlipidemia, unspecified hyperlipidemia type  E78.5 CBC with Differential/Platelet    Comprehensive metabolic panel  Lipid panel    4. Stage 3 chronic kidney disease, unspecified whether stage 3a or 3b CKD (HCC)  N18.30 CBC with Differential/Platelet    Comprehensive metabolic panel    5. Vitamin D deficiency  E55.9 VITAMIN D 25 Hydroxy (Vit-D Deficiency, Fractures)    6. Hyperglycemia  R73.9 HgB A1c     No orders of the defined types were placed in this encounter.  I,Harris Phan,acting as a Education administrator for Garret Reddish, MD.,have documented all relevant documentation on the behalf of Garret Reddish, MD,as directed by  Garret Reddish, MD while in the presence of Garret Reddish, MD.  I, Garret Reddish, MD, have reviewed all documentation for this visit. The documentation on 01/14/21 for the exam, diagnosis, procedures, and orders are all accurate and complete.   Return precautions advised.  Garret Reddish, MD

## 2021-01-14 NOTE — Patient Instructions (Addendum)
Please stop by lab before you go If you have mychart- we will send your results within 3 business days of [place] receiving them.  If you do not have mychart- we will call you about results within 5 business days of [place] receiving them.  *please also note that you will see labs on mychart as soon as they post. I will later go in and write notes on them- will say "notes from Dr. Yong Channel"  Declines all immunizations for today.  Please monitor your bilateral wrist pain - let me know if you have any new or worsening symptoms.  EKG appears largely stable (some minor changes) and could all be stress related with thanksgiving and loss of Karleen Hampshire a year ago- but I still think itd be reasonable/advisable to schedule cardiology follow up - please call them this afternoon and advise of new neck pain   Recommended follow up: Return in about 3 months (around 04/16/2021) for follow up- or sooner if needed.

## 2021-01-15 LAB — COMPREHENSIVE METABOLIC PANEL
ALT: 10 U/L (ref 0–35)
AST: 18 U/L (ref 0–37)
Albumin: 4.2 g/dL (ref 3.5–5.2)
Alkaline Phosphatase: 64 U/L (ref 39–117)
BUN: 40 mg/dL — ABNORMAL HIGH (ref 6–23)
CO2: 28 mEq/L (ref 19–32)
Calcium: 9.2 mg/dL (ref 8.4–10.5)
Chloride: 105 mEq/L (ref 96–112)
Creatinine, Ser: 1.6 mg/dL — ABNORMAL HIGH (ref 0.40–1.20)
GFR: 27.72 mL/min — ABNORMAL LOW (ref >60.00)
Glucose, Bld: 85 mg/dL (ref 70–99)
Potassium: 4.4 mEq/L (ref 3.5–5.1)
Sodium: 140 mEq/L (ref 135–145)
Total Bilirubin: 0.4 mg/dL (ref 0.2–1.2)
Total Protein: 7 g/dL (ref 6.0–8.3)

## 2021-01-15 LAB — LIPID PANEL
Cholesterol: 193 mg/dL (ref 0–200)
HDL: 64.4 mg/dL (ref >39.00)
LDL Cholesterol: 101 mg/dL — ABNORMAL HIGH (ref 0–99)
NonHDL: 128.67
Total CHOL/HDL Ratio: 3
Triglycerides: 140 mg/dL (ref 0.0–149.0)
VLDL: 28 mg/dL (ref 0.0–40.0)

## 2021-01-15 LAB — VITAMIN D 25 HYDROXY (VIT D DEFICIENCY, FRACTURES): VITD: 29.98 ng/mL — ABNORMAL LOW (ref 30.00–100.00)

## 2021-01-15 LAB — TSH: TSH: 3.98 u[IU]/mL (ref 0.35–5.50)

## 2021-02-13 ENCOUNTER — Encounter: Payer: Self-pay | Admitting: Family

## 2021-02-13 ENCOUNTER — Telehealth (INDEPENDENT_AMBULATORY_CARE_PROVIDER_SITE_OTHER): Payer: Medicare Other | Admitting: Family

## 2021-02-13 VITALS — Ht 63.0 in | Wt 120.6 lb

## 2021-02-13 DIAGNOSIS — R197 Diarrhea, unspecified: Secondary | ICD-10-CM | POA: Insufficient documentation

## 2021-02-13 MED ORDER — LOPERAMIDE HCL 2 MG PO TABS: 2.0000 mg | ORAL_TABLET | Freq: Four times a day (QID) | ORAL | 0 refills | Status: DC | PRN

## 2021-02-13 NOTE — Assessment & Plan Note (Signed)
2 days, pt thinks r/t to her medicines, but denies anything new or any dose changes. pt taking OTC  digestive enzymes and probiotics. Advised some probiotics can be for constipation vs. diarrhea, she may want to hold and see if any improvement. Sending Immodium AD, advised on use & SE, importance of hydration.

## 2021-02-13 NOTE — Progress Notes (Signed)
Virtual telephone visit    Virtual Visit via Telephone Note   This visit type was conducted due to national recommendations for restrictions regarding the COVID-19 Pandemic (e.g. social distancing) in an effort to limit this patient's exposure and mitigate transmission in our community. Due to her co-morbid illnesses, this patient is at least at moderate risk for complications without adequate follow up. This format is felt to be most appropriate for this patient at this time. The patient did not have access to video technology or had technical difficulties with video requiring transitioning to audio format only (telephone). Physical exam was limited to content and character of the telephone converstion. CMA was able to get the patient set up on a telephone visit.   Patient location: Home. Patient and provider in visit Provider location: Office  I discussed the limitations of evaluation and management by telemedicine and the availability of in person appointments. The patient expressed understanding and agreed to proceed.   Visit Date: 02/13/2021  Today's healthcare provider: Jeanie Sewer, NP     Subjective:    Patient ID: [name], female    DOB: 05-01-1925, 87 y.o.   MRN: [id number]  Chief Complaint  Patient presents with   Diarrhea    Started 2 days ago. She feels like it is due to medication she is taking. Urgency after eating.     HPI Diarrhea: Patient complains of diarrhea. Onset of diarrhea was several days ago. Diarrhea is occurring approximately 3 times per day. Patient describes diarrhea as semisolid and watery.  Patient denies blood in stool, fever, illness in household contacts, recent antibiotic use, recent travel, significant abdominal pain, unintentional weight loss.  Previous visits for diarrhea:  no . Evaluation to date: none.   Past Medical History:  Diagnosis Date   Arthritis    Chicken pox    Glaucoma of both eyes    High cholesterol     Hypertension    a. Normal renal arteries by duplex 2013.   Hypothyroidism    Migraine    PAD (peripheral artery disease) ([place])    a. 04/2013: s/p PCI to occluded right popliteal artery    PAF (paroxysmal atrial fibrillation) ([place])    a. s/p MAZE 2006. b. Event monitor 2013: NSR with PACs.   Sinus bradycardia    a. 03/2011 during hospital admission - beta blocker stopped.   Stroke [facility])    a. Pt reports 3-4 prior strokes without residual sx. b. CVA 2013 (presented with headache) - started on Plavix. Event monitor as outpt - no AF.    Past Surgical History:  Procedure Laterality Date   CATARACT EXTRACTION Left 11/09/2017   LOWER EXTREMITY ANGIOGRAM N/A 05/15/2013   Procedure: LOWER EXTREMITY ANGIOGRAM;  Surgeon: Lorretta Harp, MD;  Location: [facility] CATH LAB;  Service: Cardiovascular;  Laterality: N/A;   MAZE  ~ 2006   POPLITEAL ARTERY STENT  05/15/2013    Outpatient Medications Prior to Visit  Medication Sig Dispense Refill   ARMOUR THYROID 15 MG tablet TAKE 1 TABLET BY MOUTH DAILY. 90 tablet 1   chlorthalidone (HYGROTON) 25 MG tablet Take 1 tablet (25 mg total) by mouth daily. 90 tablet 1   Digestive Enzyme CAPS Take 1 capsule by mouth See admin instructions. Mix the contents of 1 capsule into water and drink with all meals     dorzolamide-timolol (COSOPT) 22.3-6.8 MG/ML ophthalmic solution Place 1 drop into the left eye in the morning and at bedtime.  doxazosin (CARDURA) 2 MG tablet Take 0.5 tablets (1 mg total) by mouth at bedtime. 45 tablet 1   ELIQUIS 2.5 MG TABS tablet TAKE 1 TABLET(2.5 MG) BY MOUTH TWICE DAILY 180 tablet 3   hydrALAZINE (APRESOLINE) 25 MG tablet TAKE 1 TABLET(25 MG) BY MOUTH THREE TIMES DAILY 270 tablet 1   irbesartan (AVAPRO) 300 MG tablet Take 1 tablet (300 mg total) by mouth daily. 90 tablet 3   Multiple Vitamin (MULTIVITAMIN) LIQD Take 30 mLs by mouth daily.     Nutritional Supplements (FEEDING SUPPLEMENT, KATE FARMS STANDARD 1.4,) LIQD liquid Take 325 mLs  by mouth 3 (three) times daily between meals.     Papaya TABS Take 3 tablets by mouth 3 (three) times daily.      Probiotic CAPS Take 1 capsule by mouth daily.     metoprolol tartrate (LOPRESSOR) 25 MG tablet Take 0.5 tablets (12.5 mg total) by mouth daily as needed (HR >110 take only BID). 180 tablet 3   No facility-administered medications prior to visit.    Allergies  Allergen Reactions   Aceon [Perindopril Erbumine] Cough   Amlodipine Other (See Comments)    Lower extremity discomfort   Aspirin Other (See Comments) and Cough    STOPPED BY A MEDICAL PROFESSIONAL   Azor [Amlodipine-Olmesartan] Cough   Beta Adrenergic Blockers Other (See Comments)    Bradycardia   Hydralazine Nausea And Vomiting and Other (See Comments)    Weakness- tolerating in 2021    Milk-Related Compounds Other (See Comments) and Cough    STOPPED BY A MEDICAL PROFESSIONAL   Other Other (See Comments) and Cough    NUTS- ALL TYPES- (STOPPED BY A MEDICAL PROFESSIONAL) NO FOODS THAT CAUSE GAS (will cause A-Fib)   Peanut-Containing Drug Products Other (See Comments) and Cough    STOPPED BY A MEDICAL PROFESSIONAL   Sulfamethoxazole-Trimethoprim Other (See Comments)    Patient cannot specifically recall the reaction   Vitamin D Analogs Cough        Objective:     Ht 5\' 3"  (1.6 m)    Wt 120 lb 9.5 oz (54.7 kg)    BMI 21.36 kg/m   Wt Readings from Last 3 Encounters:  02/13/21 120 lb 9.5 oz (54.7 kg)  01/14/21 120 lb 8 oz (54.7 kg)  09/17/20 118 lb 6.4 oz (53.7 kg)        Assessment & Plan:   Problem List Items Addressed This Visit       Other   Diarrhea - Primary    2 days, pt thinks r/t to her medicines, but denies anything new or any dose changes. pt taking OTC  digestive enzymes and probiotics. Advised some probiotics can be for constipation vs. diarrhea, she may want to hold and see if any improvement. Sending Immodium AD, advised on use & SE, importance of hydration.      Relevant  Medications   loperamide (IMODIUM A-D) 2 MG tablet     Meds ordered this encounter  Medications   loperamide (IMODIUM A-D) 2 MG tablet    Sig: Take 1 tablet (2 mg total) by mouth 4 (four) times daily as needed for diarrhea or loose stools.    Dispense:  20 tablet    Refill:  0    Order Specific Question:   Supervising Provider    Answer:   ANDY, CAMILLE L [[id number]]     I discussed the assessment and treatment plan with the patient. The patient was provided an opportunity to ask  questions and all were answered. The patient agreed with the plan and demonstrated an understanding of the instructions.   The patient was advised to call back or seek an in-person evaluation if the symptoms worsen or if the condition fails to improve as anticipated.  I provided 21 minutes of non-face-to-face time during this encounter.   Jeanie Sewer, NP [place] [phone] (phone) [phone] (fax)  [place]

## 2021-02-25 ENCOUNTER — Other Ambulatory Visit: Payer: Self-pay

## 2021-02-25 ENCOUNTER — Ambulatory Visit (INDEPENDENT_AMBULATORY_CARE_PROVIDER_SITE_OTHER): Payer: Medicare Other | Admitting: Family Medicine

## 2021-02-25 ENCOUNTER — Encounter: Payer: Self-pay | Admitting: Family Medicine

## 2021-02-25 ENCOUNTER — Other Ambulatory Visit: Payer: Self-pay | Admitting: Family Medicine

## 2021-02-25 VITALS — BP 130/50 | HR 55 | Temp 97.9°F | Ht 63.0 in | Wt 120.8 lb

## 2021-02-25 DIAGNOSIS — N183 Chronic kidney disease, stage 3 unspecified: Secondary | ICD-10-CM | POA: Diagnosis not present

## 2021-02-25 DIAGNOSIS — I739 Peripheral vascular disease, unspecified: Secondary | ICD-10-CM | POA: Diagnosis not present

## 2021-02-25 DIAGNOSIS — I10 Essential (primary) hypertension: Secondary | ICD-10-CM

## 2021-02-25 DIAGNOSIS — R252 Cramp and spasm: Secondary | ICD-10-CM | POA: Diagnosis not present

## 2021-02-25 DIAGNOSIS — R197 Diarrhea, unspecified: Secondary | ICD-10-CM

## 2021-02-25 DIAGNOSIS — E785 Hyperlipidemia, unspecified: Secondary | ICD-10-CM

## 2021-02-25 DIAGNOSIS — R0989 Other specified symptoms and signs involving the circulatory and respiratory systems: Secondary | ICD-10-CM

## 2021-02-25 DIAGNOSIS — E039 Hypothyroidism, unspecified: Secondary | ICD-10-CM

## 2021-02-25 DIAGNOSIS — R739 Hyperglycemia, unspecified: Secondary | ICD-10-CM | POA: Diagnosis not present

## 2021-02-25 DIAGNOSIS — E559 Vitamin D deficiency, unspecified: Secondary | ICD-10-CM

## 2021-02-25 LAB — COMPREHENSIVE METABOLIC PANEL
ALT: 7 U/L (ref 0–35)
AST: 15 U/L (ref 0–37)
Albumin: 3.9 g/dL (ref 3.5–5.2)
Alkaline Phosphatase: 58 U/L (ref 39–117)
BUN: 41 mg/dL — ABNORMAL HIGH (ref 6–23)
CO2: 27 mEq/L (ref 19–32)
Calcium: 8.7 mg/dL (ref 8.4–10.5)
Chloride: 103 mEq/L (ref 96–112)
Creatinine, Ser: 1.62 mg/dL — ABNORMAL HIGH (ref 0.40–1.20)
GFR: 27.29 mL/min — ABNORMAL LOW (ref >60.00)
Glucose, Bld: 101 mg/dL — ABNORMAL HIGH (ref 70–99)
Potassium: 4 mEq/L (ref 3.5–5.1)
Sodium: 137 mEq/L (ref 135–145)
Total Bilirubin: 0.4 mg/dL (ref 0.2–1.2)
Total Protein: 6.7 g/dL (ref 6.0–8.3)

## 2021-02-25 LAB — TSH: TSH: 3.86 u[IU]/mL (ref 0.35–5.50)

## 2021-02-25 LAB — MAGNESIUM: Magnesium: 3.2 mg/dL — ABNORMAL HIGH (ref 1.5–2.5)

## 2021-02-25 NOTE — Progress Notes (Signed)
Phone [phone] In person visit   Subjective:   [name] is a 88 y.o. year old very pleasant female patient who presents for/with See problem oriented charting Chief Complaint  Patient presents with   Diarrhea    Pt states she has had diarrhea that started a week ago and had abdominal cramps and she would have the episodes only in the morning and decreased energy. She states her appetite is fair and she thinks this may be coming from a medication.    This visit occurred during the SARS-CoV-2 public health emergency.  Safety protocols were in place, including screening questions prior to the visit, additional usage of staff PPE, and extensive cleaning of exam room while observing appropriate contact time as indicated for disinfecting solutions.   Past Medical History-  Patient Active Problem List   Diagnosis Date Noted   COVID-19 10/12/2019    Priority: High   Chronic cough 08/12/2015    Priority: High   PAD (peripheral artery disease) ([place]) 05/10/2013    Priority: High   PAF (paroxysmal atrial fibrillation) ([place])     Priority: High   History of stroke     Priority: High   CKD (chronic kidney disease), stage III 06/15/2018    Priority: Medium    Hyperglycemia 06/25/2014    Priority: Medium    Hypothyroidism 06/07/2014    Priority: Medium    Hyperlipidemia 06/07/2014    Priority: Medium    Hypertension     Priority: Medium    GERD (gastroesophageal reflux disease) 06/25/2014    Priority: Low   Vitamin D deficiency 06/25/2014    Priority: Low   Intracervical pessary 06/07/2014    Priority: Low   Sinus bradycardia 05/19/2013    Priority: Low   PAC (premature atrial contraction) 05/20/2011    Priority: Low   Diarrhea 02/13/2021   Anemia of chronic disease 12/14/2019   Low vitamin B12 level 12/09/2017   Anemia 08/16/2017   Perennial allergic rhinitis 08/12/2015    Medications- reviewed and updated Current Outpatient Medications  Medication Sig Dispense  Refill   ARMOUR THYROID 15 MG tablet TAKE 1 TABLET BY MOUTH DAILY. 90 tablet 1   chlorthalidone (HYGROTON) 25 MG tablet Take 1 tablet (25 mg total) by mouth daily. 90 tablet 1   Digestive Enzyme CAPS Take 1 capsule by mouth See admin instructions. Mix the contents of 1 capsule into water and drink with all meals     dorzolamide-timolol (COSOPT) 22.3-6.8 MG/ML ophthalmic solution Place 1 drop into the left eye in the morning and at bedtime.      doxazosin (CARDURA) 2 MG tablet Take 0.5 tablets (1 mg total) by mouth at bedtime. 45 tablet 1   ELIQUIS 2.5 MG TABS tablet TAKE 1 TABLET(2.5 MG) BY MOUTH TWICE DAILY 180 tablet 3   hydrALAZINE (APRESOLINE) 25 MG tablet TAKE 1 TABLET(25 MG) BY MOUTH THREE TIMES DAILY 270 tablet 1   irbesartan (AVAPRO) 300 MG tablet Take 1 tablet (300 mg total) by mouth daily. 90 tablet 3   loperamide (IMODIUM A-D) 2 MG tablet Take 1 tablet (2 mg total) by mouth 4 (four) times daily as needed for diarrhea or loose stools. 20 tablet 0   Multiple Vitamin (MULTIVITAMIN) LIQD Take 30 mLs by mouth daily.     Nutritional Supplements (FEEDING SUPPLEMENT, KATE FARMS STANDARD 1.4,) LIQD liquid Take 325 mLs by mouth 3 (three) times daily between meals.     Papaya TABS Take 3 tablets by mouth 3 (three)  times daily.      Probiotic CAPS Take 1 capsule by mouth daily.     metoprolol tartrate (LOPRESSOR) 25 MG tablet Take 0.5 tablets (12.5 mg total) by mouth daily as needed (HR >110 take only BID). 180 tablet 3   No current facility-administered medications for this visit.     Objective:  BP (!) 130/50    Pulse (!) 55    Temp 97.9 F (36.6 C)    Ht 5\' 3"  (1.6 m)    Wt 120 lb 12.8 oz (54.8 kg)    SpO2 98%    BMI 21.40 kg/m  Gen: NAD, resting comfortably CV: Bradycardic and irregular Lungs: CTAB no crackles, wheeze, rhonchi Skin: warm, dry    Assessment and Plan   # Diarrhea/leg cramps S: Patient was initially seen by Jeanie Sewer, NP on 02/13/2021 where she reported having  diarrhea for several days - she thought there to be some relation to her medications but was not taking anything new. Was having at least 3 stools per day. Described diarrhea as semisolid and watery. Denies any blood in stools, fever, unintentional weight loss, or significant abdominal pain. - Patient did report taking some OTC digestive enzymes and probiotics - was advised to hold off and see if there would be any improvement in symptoms - patient was also prescribed Imodium AD 2 mg to take 4 times daily as needed   Today, reports about 3-4 days of symptoms about 3x a day initially and would have abdominal cramps at that same time- daughter gave her magnesium and that resolved issues. Imodium was used short term and helpful.  Since then she has had intermittent diarrhea- she is concerned could be medication related or some other underlying issue- has not needed imodium in recent days. No recent travel. Leg cramps remain resolved.    A/P: Patient with 3 to 4 days of 3 episodes of diarrhea per day around Christmas time with intermittent diarrhea since that time-she would like to investigate further for underlying cause and we agreed to do GI pathogen panel.  I am encouraged that her symptoms are improved so much.  We will check labs including CMP and magnesium given prior leg cramping but glad that has resolved  With diarrhea also make sure renal function is okay and make sure thyroid is appropriately treated-TSH was well controlled in November  #hypertension #chronic cough S: medication: chlorthalidone 25 mg, metoprolol 12.5 mg BID only if HR gets high,  Irbesartan 300 mg- half in AM and half in PM, and hydralazine 25 mg TID - second dose in 2 PM, doxazosin 1 mg (half 2 mg tablet)-she has been concerned could be causing diarrhea in the past but in past- Doxazosin was stopped for trial due to diarrhea concern- ultimtaely diarrhea got better even though restarting until recently  -family concerned lower BP  is too low  particularly diastolic- seems more fatigued when this is low (we discussed diarrhea could be a cause)  - continues to be concerned about chronic cough, Cough and ends with sneeze- patient and daughter feel this is more of an allergic issue- had seen allerist in past Home readings #s:  as low as 40s and 01U on diastolic readings at home and feels run down with this  BP Readings from Last 3 Encounters:  02/25/21 (!) 130/50  01/14/21 138/60  09/17/20 272/53  A/P: Diastolic is running lower- wonder if related to recent diarrhea. We are going to stop doxazosin for now and update  me in 1-2 weeks (did not remove from med list yet). They also wonder if bp meds contributing to cough but with PAD we want to be careful to not let BP get too high - daughter/patient wanted to keep sbp under 140 if possible while allowing diastolic slightly higher- we will see how she does with this change- also considered continuing doxazosin and taking only 150 irbesarta/avapro once a day  - for chronic cough -has seen allergist in past ,  diet helped when changed drastically but was losing weight - wants to tweak meds as first step - for now declines pulmonary and chest CT which were suggested today (she thinks more allergic and not lung issues as her reasoning) though she may want to return to allergist    #hyperlipidemia #history of CVA/PAD (used to be on Plavix now on Eliquis alone-arterial duplex followed by Dr. Gwenlyn Found last September 2022 with plan for repeat in 12 months) S: Medication: none - patient had declined even trialing rosuvastatin 5 mg in the past-was still declining at last visit - last visit patient mentioned having some stress due to the holidays and would ultimately get some left neck pain. EKE appeared largely stable - could have been stress related with holidays coming up and loss of husband a year ago. I still thought it to be reasonable to schedule a cardiology follow-up. Lab Results   Component Value Date   CHOL 193 01/14/2021   HDL 64.40 01/14/2021   LDLCALC 101 (H) 01/14/2021   TRIG 140.0 01/14/2021   CHOLHDL 3 01/14/2021   A/P: Hyperlipidemia poorly controlled and patient  declined statin multiple times in the past.  She does have a new left carotid bruit which was initially detected on a wellness visit at home-we will get carotid duplex for more information and do bilateral with PAD history- continue eliquis alone since declines statin  Also sees cardiology soon-hopefully have more success on statin discussions that I have had in the past  #hypothyroidism S: compliant On thyroid medication-Armour Thyroid 15 mg  Lab Results  Component Value Date   TSH 3.98 01/14/2021    A/P:hopefully stable- update TSH today especially with diarrhea issues. Continue current meds for now    #Chronic kidney disease stage III S: After COVID-19 patients kidney function appeared to progress to chronic kidney disease stage IV - we did suggest referral to nephrology after a prior visit but patient wanted to discuss with family. A/P: Largely stable on last labs-we will update CMP today-likely suggest again referral to nephrology  Recommended follow up: Return for as needed for new, worsening, persistent symptoms.  Otherwise keep visit on March 9 Future Appointments  Date Time Provider [place]  03/14/2021  3:00 PM Burnell Blanks, MD [facility]  05/01/2021  1:00 PM Yong Channel Brayton Mars, MD [facility]    Lab/Order associations:   ICD-10-CM   1. Diarrhea, unspecified type  R19.7 Comprehensive metabolic panel    Magnesium    GI Profile, Stool, PCR    CANCELED: GI Profile, Stool, PCR    2. Leg cramps  R25.2 Comprehensive metabolic panel    Magnesium    3. Primary hypertension  I10 TSH    4. Hyperlipidemia, unspecified hyperlipidemia type  E78.5 TSH    5. Hypothyroidism, unspecified type  E03.9     6. Stage 3 chronic kidney disease, unspecified  whether stage 3a or 3b CKD (HCC)  N18.30     7. Vitamin D deficiency  E55.9  8. Hyperglycemia  R73.9     9. PAD (peripheral artery disease) (HCC)  I73.9 US Carotid Duplex Bilateral    10. Left carotid bruit  R09.89 US Carotid Duplex Bilateral      No orders of the defined types were placed in this encounter.  I,Harris Phan,acting as a Education administrator for Garret Reddish, MD.,have documented all relevant documentation on the behalf of Garret Reddish, MD,as directed by  Garret Reddish, MD while in the presence of Garret Reddish, MD.   I, Garret Reddish, MD, have reviewed all documentation for this visit. The documentation on 02/25/21 for the exam, diagnosis, procedures, and orders are all accurate and complete.   Return precautions advised.  Garret Reddish, MD

## 2021-02-25 NOTE — Patient Instructions (Addendum)
Please stop by lab before you go If you have mychart- we will send your results within 3 business days of [place] receiving them.  If you do not have mychart- we will call you about results within 5 business days of [place] receiving them.  *please also note that you will see labs on mychart as soon as they post. I will later go in and write notes on them- will say "notes from Dr. Yong Channel"  Collect stool samples at home - make sure to collect a loose/watery one and not a solid stool.  Hold off on the Imodium and any probiotics/digestive enzymes that you are taking for now- may restart later depending on situation  Trial off doxazosin half tablet before bed and update with me in one week with home blood pressure readings. We are hoping to get your bottom number above 60 while hoping to keep your top number below 140.  Recommended follow up: Return for as needed for new, worsening, persistent symptoms. Otherwise keep march visit

## 2021-02-26 ENCOUNTER — Ambulatory Visit
Admission: RE | Admit: 2021-02-26 | Discharge: 2021-02-26 | Disposition: A | Discharge status: Home / Self Care | Payer: Medicare Other | Source: Ambulatory Visit | Attending: Family Medicine | Admitting: Family Medicine

## 2021-02-26 DIAGNOSIS — R0989 Other specified symptoms and signs involving the circulatory and respiratory systems: Secondary | ICD-10-CM

## 2021-02-26 DIAGNOSIS — I739 Peripheral vascular disease, unspecified: Secondary | ICD-10-CM

## 2021-02-26 DIAGNOSIS — I6523 Occlusion and stenosis of bilateral carotid arteries: Secondary | ICD-10-CM | POA: Diagnosis not present

## 2021-03-03 ENCOUNTER — Other Ambulatory Visit: Payer: Self-pay

## 2021-03-03 MED ORDER — ATORVASTATIN CALCIUM 10 MG PO TABS: 10.0000 mg | ORAL_TABLET | ORAL | 3 refills | Status: DC

## 2021-03-06 ENCOUNTER — Other Ambulatory Visit: Payer: Medicare Other

## 2021-03-06 DIAGNOSIS — A09 Infectious gastroenteritis and colitis, unspecified: Secondary | ICD-10-CM | POA: Diagnosis not present

## 2021-03-06 DIAGNOSIS — R109 Unspecified abdominal pain: Secondary | ICD-10-CM | POA: Diagnosis not present

## 2021-03-06 DIAGNOSIS — A084 Viral intestinal infection, unspecified: Secondary | ICD-10-CM | POA: Diagnosis not present

## 2021-03-06 DIAGNOSIS — R197 Diarrhea, unspecified: Secondary | ICD-10-CM | POA: Diagnosis not present

## 2021-03-09 LAB — GI PROFILE, STOOL, PCR

## 2021-03-10 ENCOUNTER — Telehealth: Payer: Self-pay

## 2021-03-10 NOTE — Telephone Encounter (Signed)
We were also interested to see if her fatigue improved with less medication-did that happen to improve?  Did her cough improved at all?  I certainly do not love the top numbers at times particularly when it is up to 161 off of the doxazosin but on the other hand at her age if it is increasing her quality of life to let this run slightly higher we may need to tolerate this even despite her medical conditions

## 2021-03-10 NOTE — Telephone Encounter (Signed)
Pt called in to report her recent BP readings:  03/01/21: 168/61 HR 54 am       157/55 HR 54pm  03/03/21: 139/60 HR 46 am       150/51 HR 57 pm  03/04/21: 131/57 HR 54 am                 163/53 HR 56 pm  03/09/21: 141/54 HR 48 am       156/49 HR 49 pm         03/10/21: 153/62 am

## 2021-03-11 NOTE — Telephone Encounter (Signed)
Called and spoke with pt and she state she has not noticed any difference in the fatigue, however she does not have the cough anymore. She states she noticed her bp got into the 180's the other night and she took a Doxazosin and it went back down so she would like to stay off of the Doxazosin for now and if she notices the top numbers increasing then she will take a Doxazosin at that time to bring the top number down, she would like to try this method for a while.

## 2021-03-11 NOTE — Telephone Encounter (Signed)
Im ok with that option- why dont we use if over 155 that she takes doxazosin so we prevent her from getting too high

## 2021-03-13 NOTE — Telephone Encounter (Signed)
Called and spoke with pt and message given. 

## 2021-03-14 ENCOUNTER — Other Ambulatory Visit: Payer: Self-pay

## 2021-03-14 ENCOUNTER — Encounter: Payer: Self-pay | Admitting: Cardiovascular Disease

## 2021-03-14 ENCOUNTER — Ambulatory Visit: Payer: Medicare Other | Admitting: Cardiovascular Disease

## 2021-03-14 VITALS — BP 154/70 | HR 51 | Ht 63.0 in | Wt 121.0 lb

## 2021-03-14 DIAGNOSIS — I739 Peripheral vascular disease, unspecified: Secondary | ICD-10-CM

## 2021-03-14 DIAGNOSIS — I10 Essential (primary) hypertension: Secondary | ICD-10-CM | POA: Diagnosis not present

## 2021-03-14 DIAGNOSIS — I480 Paroxysmal atrial fibrillation: Secondary | ICD-10-CM

## 2021-03-14 MED ORDER — IRBESARTAN 300 MG PO TABS: ORAL_TABLET | ORAL | 3 refills | Status: DC

## 2021-03-14 NOTE — Progress Notes (Signed)
Chief Complaint  Patient presents with   Follow-up    Atrial fib   History of Present Illness: 88 yo female with history of PVCs, PACs, HTN, CVA, PAF s/p MAZE 2006, PAD who is here today for cardiac follow up. I have followed her for atrial fibrillation and HTN. Her HTN has been difficult to control over the years. She has not tolerated many medications. She has most recently tolerated Avapro, chlorthalidone and hydralazine well. She has not tolerated Norvasc, Coreg, Bystolic or clonidine. I met her at [facility] in February 2013 when she was admitted with a headache and found to have sinus brady with elevated BP. She had initially been tried on Bystolic but did not tolerate due to bradycardia. She was started on Norvasc in the hospital but had burning in her legs so she stopped it. Echo 04/02/11 with normal LV size and function. Event monitor in 2013 showed NSR with PACs. No evidence of atrial fib. Renal artery dopplers November 2013 without evidence of renal artery stenosis. She has PAD followed by Dr. Gwenlyn Found and underwent successful balloon/stenting of an occluded right popliteal artery per Dr. Gwenlyn Found in 2015. ABI stable Sept 2022. Echo June 2015 at [facility] with normal LV function. Cardiac monitor October 2017 with PVCs and PACs, no atrial fib. She was seen in the ED at [facility] 03/24/19 with tachycardia. Per report, EMS strips showed atrial fibrillation with RVR. She converted to sinus prior to arrival to the ED. I saw her in the office February 2021 and started Eliquis and Cardizem. She had several ED visits in 2021 with atrial fib. She has been seen in the HTN clinic for BP control. Renal artery dopplers normal in April 2022. Last seen 07/03/20 by Richardson Dopp, PA-C and doing well.   She is here today for follow up. The patient denies any chest pain, dyspnea, palpitations, lower extremity edema, orthopnea, PND, dizziness, near syncope or syncope.     Primary Care Physician:  [name],  MD  Past Medical History:  Diagnosis Date   Arthritis    Chicken pox    Glaucoma of both eyes    High cholesterol    Hypertension    a. Normal renal arteries by duplex 2013.   Hypothyroidism    Migraine    PAD (peripheral artery disease) ([place])    a. 04/2013: s/p PCI to occluded right popliteal artery    PAF (paroxysmal atrial fibrillation) ([place])    a. s/p MAZE 2006. b. Event monitor 2013: NSR with PACs.   Sinus bradycardia    a. 03/2011 during hospital admission - beta blocker stopped.   Stroke [facility])    a. Pt reports 3-4 prior strokes without residual sx. b. CVA 2013 (presented with headache) - started on Plavix. Event monitor as outpt - no AF.    Past Surgical History:  Procedure Laterality Date   CATARACT EXTRACTION Left 11/09/2017   LOWER EXTREMITY ANGIOGRAM N/A 05/15/2013   Procedure: LOWER EXTREMITY ANGIOGRAM;  Surgeon: Lorretta Harp, MD;  [facility];  Service: Cardiovascular;  Laterality: N/A;   MAZE  ~ 2006   POPLITEAL ARTERY STENT  05/15/2013    Current Outpatient Medications  Medication Sig Dispense Refill   ARMOUR THYROID 15 MG tablet TAKE 1 TABLET BY MOUTH DAILY 90 tablet 1   chlorthalidone (HYGROTON) 25 MG tablet Take 1 tablet (25 mg total) by mouth daily. 90 tablet 1   Digestive Enzyme CAPS Take 1 capsule by mouth See  admin instructions. Mix the contents of 1 capsule into water and drink with all meals     dorzolamide-timolol (COSOPT) 22.3-6.8 MG/ML ophthalmic solution Place 1 drop into the left eye in the morning and at bedtime.      doxazosin (CARDURA) 2 MG tablet Take 0.5 tablets (1 mg total) by mouth at bedtime. (Patient taking differently: Take 1 mg by mouth at bedtime. As needed) 45 tablet 1   ELIQUIS 2.5 MG TABS tablet TAKE 1 TABLET(2.5 MG) BY MOUTH TWICE DAILY 180 tablet 3   hydrALAZINE (APRESOLINE) 25 MG tablet TAKE 1 TABLET(25 MG) BY MOUTH THREE TIMES DAILY 270 tablet 1   loperamide (IMODIUM A-D) 2 MG tablet Take 1 tablet (2 mg total) by mouth  4 (four) times daily as needed for diarrhea or loose stools. 20 tablet 0   Multiple Vitamin (MULTIVITAMIN) LIQD Take 30 mLs by mouth daily.     Nutritional Supplements (FEEDING SUPPLEMENT, KATE FARMS STANDARD 1.4,) LIQD liquid Take 325 mLs by mouth 3 (three) times daily between meals.     Papaya TABS Take 3 tablets by mouth 3 (three) times daily.      Probiotic CAPS Take 1 capsule by mouth daily.     atorvastatin (LIPITOR) 10 MG tablet Take 1 tablet (10 mg total) by mouth once a week. (Patient not taking: Reported on 03/14/2021) 13 tablet 3   irbesartan (AVAPRO) 300 MG tablet TAKE 1 TABLET(300 MG) BY MOUTH DAILY 90 tablet 3   metoprolol tartrate (LOPRESSOR) 25 MG tablet Take 0.5 tablets (12.5 mg total) by mouth daily as needed (HR >110 take only BID). 180 tablet 3   No current facility-administered medications for this visit.    Allergies  Allergen Reactions   Aceon [Perindopril Erbumine] Cough   Amlodipine Other (See Comments)    Lower extremity discomfort   Aspirin Other (See Comments) and Cough    STOPPED BY A MEDICAL PROFESSIONAL   Azor [Amlodipine-Olmesartan] Cough   Beta Adrenergic Blockers Other (See Comments)    Bradycardia   Hydralazine Nausea And Vomiting and Other (See Comments)    Weakness- tolerating in 2021    Milk-Related Compounds Other (See Comments) and Cough    STOPPED BY A MEDICAL PROFESSIONAL   Other Other (See Comments) and Cough    NUTS- ALL TYPES- (STOPPED BY A MEDICAL PROFESSIONAL) NO FOODS THAT CAUSE GAS (will cause A-Fib)   Peanut-Containing Drug Products Other (See Comments) and Cough    STOPPED BY A MEDICAL PROFESSIONAL   Sulfamethoxazole-Trimethoprim Other (See Comments)    Patient cannot specifically recall the reaction   Vitamin D Analogs Cough    Social History   Socioeconomic History   Marital status: Widowed    Spouse name: Not on file   Number of children: Not on file   Years of education: Not on file   Highest education level: Not on  file  Occupational History   Not on file  Tobacco Use   Smoking status: Never   Smokeless tobacco: Never  Vaping Use   Vaping Use: Never used  Substance and Sexual Activity   Alcohol use: No    Alcohol/week: 0.0 standard drinks   Drug use: No   Sexual activity: Never  Other Topics Concern   Not on file  Social History Narrative   Widowed. Lost husband to COVID-19 in September 2020. Married 65 years in 2016. [place] 22 years in 2016. 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 6 greatgrandkids.    Finished  10th grade.       Retired from working in [employer] for [employer]         Social Determinants of Radio broadcast assistant Strain: Low Risk    Difficulty of Paying Living Expenses: Not hard at all  Food Insecurity: No Food Insecurity   Worried About Charity fundraiser in the Last Year: Never true   Arboriculturist in the Last Year: Never true  Transportation Needs: No Transportation Needs   Lack of Transportation (Medical): No   Lack of Transportation (Non-Medical): No  Physical Activity: Not on file  Stress: Stress Concern Present   Feeling of Stress : To some extent  Social Connections: Not on file  Intimate Partner Violence: Not on file    Family History  Problem Relation Age of Onset   Hypertension Mother    Ulcers Father    Cervical cancer Sister    Throat cancer Brother    Lung cancer Brother    Cancer Brother    Allergic rhinitis Neg Hx    Angioedema Neg Hx    Asthma Neg Hx    Atopy Neg Hx    Eczema Neg Hx    Immunodeficiency Neg Hx    Urticaria Neg Hx     Review of Systems:  As stated in the HPI and otherwise negative.   BP (!) 154/70    Pulse (!) 51    Ht '5\' 3"'  (1.6 m)    Wt 121 lb (54.9 kg)    SpO2 98%    BMI 21.43 kg/m   Physical Examination:  General: Well developed, well nourished, NAD  HEENT: OP clear, mucus membranes moist  SKIN: warm, dry. No rashes. Neuro: No focal deficits  Musculoskeletal: Muscle  strength 5/5 all ext  Psychiatric: Mood and affect normal  Neck: No JVD, no carotid bruits, no thyromegaly, no lymphadenopathy.  Lungs:Clear bilaterally, no wheezes, rhonci, crackles Cardiovascular: Regular rate and rhythm. No murmurs, gallops or rubs. Abdomen:Soft. Bowel sounds present. Non-tender.  Extremities: No lower extremity edema. Pulses are 2 + in the bilateral DP/PT.  Echo 08/02/13: Normal LV function, LVEF=55-60%. Trace TR  EKG:  EKG is not ordered today. The ekg ordered today demonstrates  Recent Labs: 01/14/2021: Hemoglobin 10.0; Platelets 160.0 02/25/2021: ALT 7; BUN 41; Creatinine, Ser 1.62; Magnesium 3.2; Potassium 4.0; Sodium 137; TSH 3.86   Lipid Panel    Component Value Date/Time   CHOL 193 01/14/2021 1359   TRIG 140.0 01/14/2021 1359   HDL 64.40 01/14/2021 1359   CHOLHDL 3 01/14/2021 1359   VLDL 28.0 01/14/2021 1359   LDLCALC 101 (H) 01/14/2021 1359   LDLCALC 133 (H) 12/06/2019 1500     Wt Readings from Last 3 Encounters:  03/14/21 121 lb (54.9 kg)  02/25/21 120 lb 12.8 oz (54.8 kg)  02/13/21 120 lb 9.5 oz (54.7 kg)     Other studies Reviewed: Additional studies/ records that were reviewed today include: . Review of the above records demonstrates:    Assessment and Plan:   1. PAF/PACs/PVCs: sinus today on exam. She has had prior MAZE for PAF. Continue metoprolol and Eliquis. Her CHADS VASC score is 7 (11.2% risk of stroke per year).   2. HTN: BP is mostly controlled. No changes  3. PAD: She is followed in [facility] clinic by Dr. Gwenlyn Found. No leg pain. ABI normal in September 2022.    Current medicines are reviewed at length with the patient today.  The patient does  not have concerns regarding medicines.  The following changes have been made:  no change  Labs/ tests ordered today include:   No orders of the defined types were placed in this encounter.   Disposition:   F/U with me in 1 year.    Signed, Lauree Chandler, MD 03/14/2021 4:29 PM     [address], [place], [place]  [address] Phone: [phone]; Fax: [phone]

## 2021-03-14 NOTE — Patient Instructions (Signed)
Medication Instructions:  No changes *If you need a refill on your cardiac medications before your next appointment, please call your pharmacy*   Lab Work: none   Testing/Procedures: none   Follow-Up: At [facility], you and your health needs are our priority.  As part of our continuing mission to provide you with exceptional heart care, we have created designated Provider Care Teams.  These Care Teams include your primary Cardiologist (physician) and Advanced Practice Providers (APPs -  Physician Assistants and Nurse Practitioners) who all work together to provide you with the care you need, when you need it.   Your next appointment:   12 month(s)  The format for your next appointment:   In Person  Provider:   Lauree Chandler, MD     Other Instructions

## 2021-03-20 ENCOUNTER — Encounter: Payer: Self-pay | Admitting: Physical Therapy

## 2021-03-20 NOTE — Therapy (Addendum)
[place] [address] [place], [place], [address] Phone: [phone]   Fax:  [phone]  Physical Therapy Evaluation  Patient Details  Name: [name] MRN: [id number] Date of Birth: Nov 15, 1925 Referring Provider (PT): Garret Reddish   Encounter Date: 10/24/2020   PT End of Session - 03/20/21 1000     Visit Number 1    Number of Visits 12    Date for PT Re-Evaluation 12/05/20    Authorization Type Medicare    PT Start Time 1062    PT Stop Time 1425    PT Time Calculation (min) 40 min    Activity Tolerance Patient tolerated treatment well    Behavior During Therapy [facility] for tasks assessed/performed             Past Medical History:  Diagnosis Date   Arthritis    Chicken pox    Glaucoma of both eyes    High cholesterol    Hypertension    a. Normal renal arteries by duplex 2013.   Hypothyroidism    Migraine    PAD (peripheral artery disease) ([place])    a. 04/2013: s/p PCI to occluded right popliteal artery    PAF (paroxysmal atrial fibrillation) ([place])    a. s/p MAZE 2006. b. Event monitor 2013: NSR with PACs.   Sinus bradycardia    a. 03/2011 during hospital admission - beta blocker stopped.   Stroke [facility])    a. Pt reports 3-4 prior strokes without residual sx. b. CVA 2013 (presented with headache) - started on Plavix. Event monitor as outpt - no AF.    Past Surgical History:  Procedure Laterality Date   CATARACT EXTRACTION Left 11/09/2017   LOWER EXTREMITY ANGIOGRAM N/A 05/15/2013   Procedure: LOWER EXTREMITY ANGIOGRAM;  Surgeon: Lorretta Harp, MD;  Location: [facility] CATH LAB;  Service: Cardiovascular;  Laterality: N/A;   MAZE  ~ 2006   POPLITEAL ARTERY STENT  05/15/2013    There were no vitals filed for this visit.    Subjective Assessment - 03/20/21 0944     Subjective L shoulder pain, for about 1 month, no injury to report. She is R handed. No previous shoulder pain. States diffiuclty reaching up and  overhead.    Pertinent History CKD, A-Fib, HTN    Limitations Lifting;House hold activities    Patient Stated Goals decreased pain, improved movement.    Currently in Pain? Yes    Pain Score 7     Pain Location Shoulder    Pain Orientation Left    Pain Descriptors / Indicators Aching    Pain Type Chronic pain    Pain Onset More than a month ago    Pain Frequency Intermittent    Aggravating Factors  lifting, reaching, movement away from body                [facility] PT Assessment - 03/20/21 0001       Assessment   Medical Diagnosis L shoulder pain    Referring Provider (PT) Garret Reddish    Hand Dominance Right    Prior Therapy no      Precautions   Precautions None      Balance Screen   Has the patient fallen in the past 6 months No      Prior Function   Level of Independence Independent      Cognition   Overall Cognitive Status Within Functional Limits for tasks assessed      AROM  Overall AROM Comments R shoulder flexion: 115    Left Shoulder Flexion 90 Degrees    Left Shoulder ABduction 90 Degrees    Left Shoulder Internal Rotation 65 Degrees    Left Shoulder External Rotation 65 Degrees      PROM   Left Shoulder Flexion 130 Degrees    Left Shoulder ABduction 120 Degrees    Left Shoulder Internal Rotation 65 Degrees    Left Shoulder External Rotation 65 Degrees      Strength   Left Shoulder Flexion 3-/5    Left Shoulder ABduction 3-/5    Left Shoulder Internal Rotation 4/5    Left Shoulder External Rotation 4/5      Palpation   Palpation comment Hypomobile GHJ                        Objective measurements completed on examination: See above findings.       [facility] PT Treatment/Exercise - 03/20/21 0001       Exercises   Exercises Shoulder      Shoulder Exercises: Supine   Flexion AAROM;15 reps    Flexion Limitations cane    Other Supine Exercises pec stretch, arm off side of table 10 sec x 5;      Shoulder Exercises:  Seated   External Rotation 15 reps    Theraband Level (Shoulder External Rotation) Level 1 (Yellow)                     PT Education - 03/20/21 9030     Education Details PT POC, Exam findings, HEP    Person(s) Educated Patient    Methods Explanation;Demonstration;Tactile cues;Verbal cues;Handout    Comprehension Verbalized understanding;Returned demonstration;Verbal cues required;Tactile cues required;Need further instruction              PT Short Term Goals - 03/20/21 1004       PT SHORT TERM GOAL #1   Title Pt to be independent with initial HEP    Time 2    Period Weeks    Status New    Target Date 11/07/20               PT Long Term Goals - 03/20/21 1004       PT LONG TERM GOAL #1   Title Pt to be independent with final HEP    Time 6    Period Weeks    Status New    Target Date 12/05/20      PT LONG TERM GOAL #2   Title Pt to report decreased pain in L shoulder, to 0-3/10 wiht activity    Time 6    Period Weeks    Status New    Target Date 12/05/20      PT LONG TERM GOAL #3   Title Pt to demo ability for AROM up to at least 120 deg of elevation, for improved ability for ADLs and IADLs.    Time 6    Period Weeks    Status New    Target Date 12/05/20                    Plan - 03/20/21 1006     Clinical Impression Statement Pt presents with primary complaint of pain in L shoulder. She has joint stiffness and pain that are limiting PROM and AROM. Pt with decreased ability for ADLs, IADLs, carrying, reaching , due to pain and deficit. Pt  to benefit from skilled PT to improve deficits and pain.    Personal Factors and Comorbidities Age    Examination-Activity Limitations Bathing;Dressing;Lift;Reach Overhead    Examination-Participation Restrictions Community Activity;Cleaning;Laundry    Stability/Clinical Decision Making Stable/Uncomplicated    Clinical Decision Making Low    Rehab Potential Good    PT Frequency 2x / week     PT Duration 6 weeks    PT Treatment/Interventions ADLs/Self Care Home Management;Iontophoresis 72m/ml Dexamethasone;Electrical Stimulation;Ultrasound;Therapeutic activities;Therapeutic exercise;Functional mobility training;Neuromuscular re-education;Patient/family education;Manual techniques;Passive range of motion;Joint Manipulations    Consulted and Agree with Plan of Care Patient             Patient will benefit from skilled therapeutic intervention in order to improve the following deficits and impairments:  Decreased mobility, Hypomobility, Decreased range of motion, Decreased activity tolerance, Decreased strength, Pain, Impaired flexibility  Visit Diagnosis: Chronic left shoulder pain     Problem List Patient Active Problem List   Diagnosis Date Noted   Diarrhea 02/13/2021   Anemia of chronic disease 12/14/2019   COVID-19 10/12/2019   CKD (chronic kidney disease), stage III 06/15/2018   Low vitamin B12 level 12/09/2017   Anemia 08/16/2017   Chronic cough 08/12/2015   Perennial allergic rhinitis 08/12/2015   GERD (gastroesophageal reflux disease) 06/25/2014   Hyperglycemia 06/25/2014   Vitamin D deficiency 06/25/2014   Hypothyroidism 06/07/2014   Hyperlipidemia 06/07/2014   Intracervical pessary 06/07/2014   Sinus bradycardia 05/19/2013   [place] 05/[place]   PAC (premature atrial contraction) 05/20/2011   PAF (paroxysmal atrial fibrillation) ([place]    History of stroke    Hypertension     [name] PT, DPT 12:47 PM  03/20/21   [place] [address]Phone: [phone]  Fax:  [phone] Name: Amanda GREGGMRN: [id number] of Birth: [birth year] PHYSICAL THERAPY DISCHARGE SUMMARY  Visits from Start of Care:  1   Plan: Patient agrees to discharge.  Patient goals were not met. Patient is being discharged due to - not returning since last  visit.    [name] PT, DPT 12:50 PM  03/20/21

## 2021-04-02 DIAGNOSIS — H401132 Primary open-angle glaucoma, bilateral, moderate stage: Secondary | ICD-10-CM | POA: Diagnosis not present

## 2021-04-08 ENCOUNTER — Other Ambulatory Visit: Payer: Self-pay | Admitting: Family Medicine

## 2021-04-08 NOTE — Telephone Encounter (Signed)
Patient states she is completely out of her medication.

## 2021-04-23 NOTE — Progress Notes (Signed)
? ?Phone [phone] ?In person visit ?  ?Subjective:  ? ?[name] is a 88 y.o. year old very pleasant female patient who presents for/with See problem oriented charting ?Chief Complaint  ?Patient presents with  ? Follow-up  ? Hypertension  ? Hypothyroidism  ? Hyperlipidemia  ? Vaginal Bleeding  ?  Pt c/o vaginal spotting that she thinks is coming from the Eliquis, she does have an appt with GYN next month.  ? ? ?This visit occurred during the SARS-CoV-2 public health emergency.  Safety protocols were in place, including screening questions prior to the visit, additional usage of staff PPE, and extensive cleaning of exam room while observing appropriate contact time as indicated for disinfecting solutions.  ? ?Past Medical History-  ?Patient Active Problem List  ? Diagnosis Date Noted  ? COVID-19 10/12/2019  ?  Priority: High  ? CKD (chronic kidney disease), stage IV ([place]) 06/15/2018  ?  Priority: High  ? Chronic cough 08/12/2015  ?  Priority: High  ? PAD (peripheral artery disease) ([place]) 05/10/2013  ?  Priority: High  ? PAF (paroxysmal atrial fibrillation) ([place])   ?  Priority: High  ? History of stroke   ?  Priority: High  ? Hyperglycemia 06/25/2014  ?  Priority: Medium   ? Hypothyroidism 06/07/2014  ?  Priority: Medium   ? Hyperlipidemia 06/07/2014  ?  Priority: Medium   ? Hypertension   ?  Priority: Medium   ? GERD (gastroesophageal reflux disease) 06/25/2014  ?  Priority: Low  ? Vitamin D deficiency 06/25/2014  ?  Priority: Low  ? Intracervical pessary 06/07/2014  ?  Priority: Low  ? Sinus bradycardia 05/19/2013  ?  Priority: Low  ? PAC (premature atrial contraction) 05/20/2011  ?  Priority: Low  ? Diarrhea 02/13/2021  ? Anemia of chronic disease 12/14/2019  ? Low vitamin B12 level 12/09/2017  ? Anemia 08/16/2017  ? Perennial allergic rhinitis 08/12/2015  ? ? ?Medications- reviewed and updated ?Current Outpatient Medications  ?Medication Sig Dispense Refill  ? ARMOUR THYROID 15 MG tablet TAKE 1 TABLET BY  MOUTH DAILY 90 tablet 1  ? chlorthalidone (HYGROTON) 25 MG tablet Take 1 tablet (25 mg total) by mouth daily. 90 tablet 1  ? Digestive Enzyme CAPS Take 1 capsule by mouth See admin instructions. Mix the contents of 1 capsule into water and drink with all meals    ? dorzolamide-timolol (COSOPT) 22.3-6.8 MG/ML ophthalmic solution Place 1 drop into the left eye in the morning and at bedtime.     ? doxazosin (CARDURA) 2 MG tablet Take 0.5 tablets (1 mg total) by mouth at bedtime. (Patient taking differently: Take 1 mg by mouth at bedtime. As needed) 45 tablet 1  ? ELIQUIS 2.5 MG TABS tablet TAKE 1 TABLET(2.5 MG) BY MOUTH TWICE DAILY 180 tablet 3  ? hydrALAZINE (APRESOLINE) 25 MG tablet TAKE 1 TABLET(25 MG) BY MOUTH THREE TIMES DAILY 270 tablet 1  ? irbesartan (AVAPRO) 300 MG tablet TAKE 1 TABLET(300 MG) BY MOUTH DAILY 90 tablet 3  ? loperamide (IMODIUM A-D) 2 MG tablet Take 1 tablet (2 mg total) by mouth 4 (four) times daily as needed for diarrhea or loose stools. 20 tablet 0  ? Multiple Vitamin (MULTIVITAMIN) LIQD Take 30 mLs by mouth daily.    ? Nutritional Supplements (FEEDING SUPPLEMENT, KATE FARMS STANDARD 1.4,) LIQD liquid Take 325 mLs by mouth 3 (three) times daily between meals.    ? Papaya TABS Take 3 tablets by mouth 3 (  three) times daily.     ? Probiotic CAPS Take 1 capsule by mouth daily.    ? rosuvastatin (CRESTOR) 10 MG tablet Take 1 tablet (10 mg total) by mouth once a week. 13 tablet 3  ? metoprolol tartrate (LOPRESSOR) 25 MG tablet Take 0.5 tablets (12.5 mg total) by mouth daily as needed (HR >110 take only BID). 180 tablet 3  ? ?No current facility-administered medications for this visit.  ? ?  ?Objective:  ?BP (!) 138/58   Pulse (!) 54   Temp 98.5 ?F (36.9 ?C)   Ht 5\' 3"  (1.6 m)   Wt 122 lb 9.6 oz (55.6 kg)   SpO2 99%   BMI 21.72 kg/m?  ?Gen: NAD, resting comfortably ?CV: RRR no murmurs rubs or gallops ?Lungs: CTAB no crackles, wheeze, rhonchi ?Ext: trace edema ?Skin: warm, dry ?  ? ?Assessment  and Plan  ? ? ?Other notes: ?1.Diarrhea resolved after sapovirus- stools still can be loose though ?2.  Prior leg cramps improved as diarrhea improved- also slowed down on magnesium as was low ?  ?#hypertension ?#chronic cough ?S: medication: chlorthalidone 25 mg,  Irbesartan 300 mg- half in AM and half in PM, and hydralazine 25 mg TID - second dose in 2 PM ( was worried about BP being low so stopped), doxazosin 1 mg (half 2 mg tablet) ?-metoprolol 12.5 mg BID only if HR gets high,  ?Home readings #s: often 120s in AM but can be up to 160s at night ?BP Readings from Last 3 Encounters:  ?05/01/21 (!) 138/58  ?03/14/21 (!) 154/70  ?02/25/21 (!) 130/50  ?A/P: well controlled today on repeat but Evening blood pressure has been high but has bene missing 2nd dose of 3 doses of hydralazine- lets try the 2nd dose back again at 2 pm and see how you do ?- she still feels like has cough from bp meds and also thinks loose stools are from BP meds ? ?#hypothyroidism ?S: compliant On thyroid medication-Armour Thyroid 15 mg  ?Lab Results  ?Component Value Date  ? TSH 3.86 02/25/2021  ? A/P:stable on recent check- continue current meds ?  ?#Chronic kidney disease stage III--> IV ?S: After COVID-19 patients kidney function appeared to progress to chronic kidney disease stage IV - we did suggest referral to nephrology after a prior visit but patient wanted to discuss with family. ?A/P:she has thought over seeing kidney doctor and is willing to go now with progression to chronic kidney disease stage IV (as noted worsened after covid) - referral placed today ? ?#carotid atherosclerosis from [place] 02/26/21 "Minor carotid atherosclerosis. Negative for stenosis. Degree of ?narrowing less than 50% bilaterally by ultrasound criteria." ?Lab Results  ?Component Value Date  ? CHOL 193 01/14/2021  ? HDL 64.40 01/14/2021  ? LDLCALC 101 (H) 01/14/2021  ? TRIG 140.0 01/14/2021  ? CHOLHDL 3 01/14/2021  ?- she is willing to try once a week rosuvastatin 10  mg to try to bring bad cholesterol down closer to 70 from 101 based on plaque in carotid arteries ?-she never picked up atorvastatin ?- also glad BP better ? ? Vaginal Bleeding  ?  Pt c/o vaginal spotting that she thinks is coming from the Eliquis, she does have an appt with GYN next month. ?-slight pink discharge has been noted. No itching or burning- I agree gynecology visit is reasonable ?  ? ?Recommended follow up: Return in about 3 months (around 08/01/2021) for follow up- or sooner if needed. ? ?Lab/Order associations: ?  ICD-10-CM   ?1. Hyperlipidemia, unspecified hyperlipidemia type  E78.5   ?  ?2. Primary hypertension  I10   ?  ?3. Hypothyroidism, unspecified type  E03.9   ?  ?4. CKD (chronic kidney disease), stage IV ([place])  N18.4 Ambulatory referral to Nephrology  ?  ? ?I,Jada Bradford,acting as a scribe for Garret Reddish, MD.,have documented all relevant documentation on the behalf of Garret Reddish, MD,as directed by  Garret Reddish, MD while in the presence of Garret Reddish, MD. ? ?I, Garret Reddish, MD, have reviewed all documentation for this visit. The documentation on 05/01/21 for the exam, diagnosis, procedures, and orders are all accurate and complete. ? ? ?Return precautions advised.  ?Garret Reddish, MD ? ? ?

## 2021-04-24 IMAGING — DX DG CHEST 1V PORT
1 series · 1 of 1 positions shown · non-contrast
Comparison: 05/16/2019

CLINICAL DATA: Weakness shortness of breath for 1 week

EXAM:
PORTABLE CHEST 1 VIEW

[chest ap]
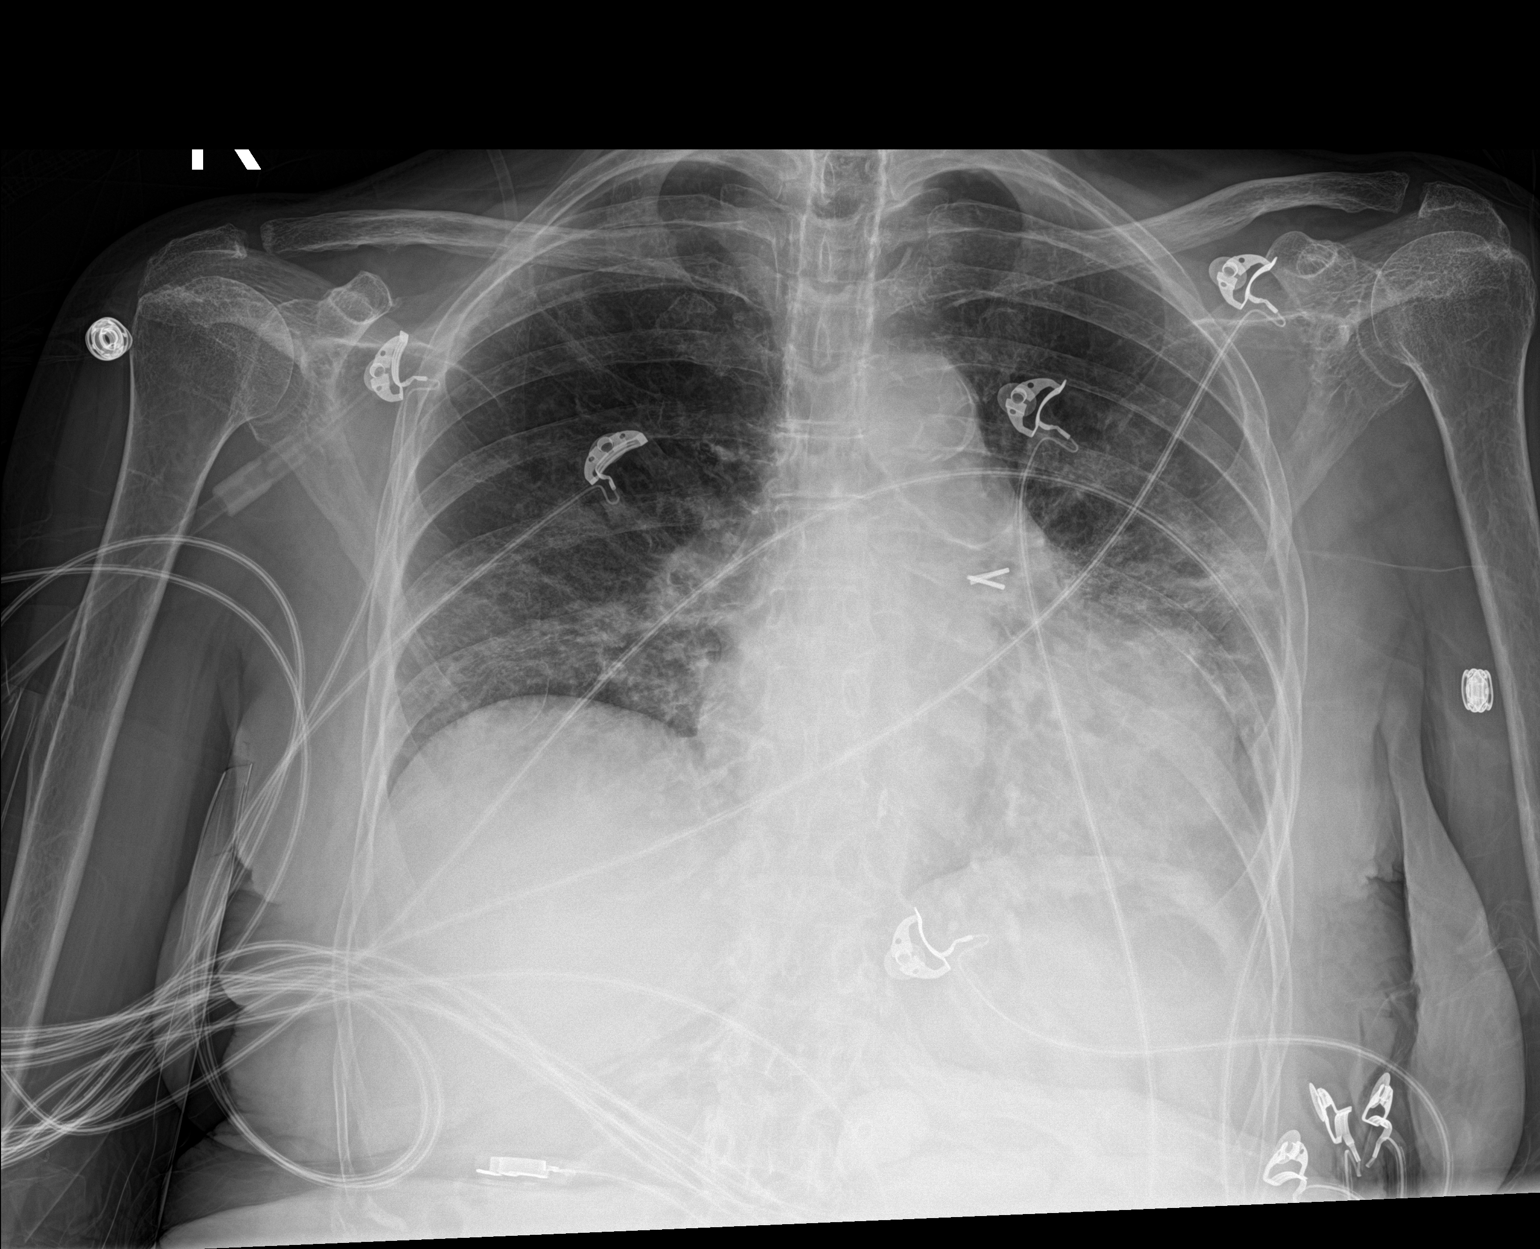

[1 of 1 positions shown; findings below may reference images not displayed]

FINDINGS: Right lower lobe and left lower lung hazy airspace disease. No
pleural effusion or pneumothorax. Stable cardiomediastinal
silhouette. No aggressive osseous lesion.
IMPRESSION: Bilateral lower lobe hazy airspace disease concerning for pneumonia
including atypical viral pneumonia.

## 2021-05-01 ENCOUNTER — Encounter: Payer: Self-pay | Admitting: Family Medicine

## 2021-05-01 ENCOUNTER — Ambulatory Visit (INDEPENDENT_AMBULATORY_CARE_PROVIDER_SITE_OTHER): Payer: Medicare Other | Admitting: Family Medicine

## 2021-05-01 VITALS — BP 138/58 | HR 54 | Temp 98.5°F | Ht 63.0 in | Wt 122.6 lb

## 2021-05-01 DIAGNOSIS — E785 Hyperlipidemia, unspecified: Secondary | ICD-10-CM | POA: Diagnosis not present

## 2021-05-01 DIAGNOSIS — N184 Chronic kidney disease, stage 4 (severe): Secondary | ICD-10-CM

## 2021-05-01 DIAGNOSIS — I6523 Occlusion and stenosis of bilateral carotid arteries: Secondary | ICD-10-CM

## 2021-05-01 DIAGNOSIS — I10 Essential (primary) hypertension: Secondary | ICD-10-CM | POA: Diagnosis not present

## 2021-05-01 DIAGNOSIS — N183 Chronic kidney disease, stage 3 unspecified: Secondary | ICD-10-CM

## 2021-05-01 DIAGNOSIS — E039 Hypothyroidism, unspecified: Secondary | ICD-10-CM

## 2021-05-01 DIAGNOSIS — I6529 Occlusion and stenosis of unspecified carotid artery: Secondary | ICD-10-CM | POA: Insufficient documentation

## 2021-05-01 MED ORDER — ROSUVASTATIN CALCIUM 10 MG PO TABS: 10.0000 mg | ORAL_TABLET | ORAL | 3 refills | Status: DC

## 2021-05-01 NOTE — Assessment & Plan Note (Signed)
#  carotid atherosclerosis from [place] 02/26/21 "Minor carotid atherosclerosis. Negative for stenosis. Degree of ?narrowing less than 50% bilaterally by ultrasound criteria." ?Lab Results  ?Component Value Date  ? CHOL 193 01/14/2021  ? HDL 64.40 01/14/2021  ? LDLCALC 101 (H) 01/14/2021  ? TRIG 140.0 01/14/2021  ? CHOLHDL 3 01/14/2021  ?- she is willing to try once a week rosuvastatin 10 mg to try to bring bad cholesterol down closer to 70 from 101 based on plaque in carotid arteries ?-she never picked up atorvastatin ?- also glad BP better ?

## 2021-05-01 NOTE — Patient Instructions (Addendum)
Evening blood pressure has been high but has bene missing 2nd dose of 3 doses of hydralazine- lets try the 2nd dose back again at 2 pm and see how you do ? ?she has thought over seeing kidney doctor and is willing to go now with progression to chronic kidney disease stage IV (as noted worsened after covid) - referral placed today. We will call you within two weeks about your referral to nephology/kidney doctors. If you do not hear within 2 weeks, give [place] a call.  ? ?Start rosuvastatin 10 mg once a week ?We had previously sent in atorvastatin '10mg'$  once a week but you did not take this- do not fill that at this time ? ?Recommended follow up: Return in about 3 months (around 08/01/2021) for follow up- or sooner if needed. ?

## 2021-05-23 ENCOUNTER — Other Ambulatory Visit (HOSPITAL_BASED_OUTPATIENT_CLINIC_OR_DEPARTMENT_OTHER): Payer: Self-pay | Admitting: Cardiovascular Disease

## 2021-05-23 NOTE — Telephone Encounter (Signed)
Rx(s) sent to pharmacy electronically.  

## 2021-06-04 DIAGNOSIS — N8111 Cystocele, midline: Secondary | ICD-10-CM | POA: Diagnosis not present

## 2021-06-04 DIAGNOSIS — N816 Rectocele: Secondary | ICD-10-CM | POA: Diagnosis not present

## 2021-06-10 ENCOUNTER — Other Ambulatory Visit: Payer: Self-pay | Admitting: Cardiovascular Disease

## 2021-06-11 NOTE — Telephone Encounter (Signed)
Rx(s) sent to pharmacy electronically.  

## 2021-06-16 ENCOUNTER — Other Ambulatory Visit: Payer: Self-pay | Admitting: Family Medicine

## 2021-07-03 IMAGING — DX DG CHEST 1V PORT
1 series · 1 of 1 positions shown · non-contrast
Comparison: Portable exam 1099 hours compared 10/14/2019

CLINICAL DATA: Atrial fibrillation, hypertension, prior stroke

EXAM:
PORTABLE CHEST 1 VIEW

[chest ap]
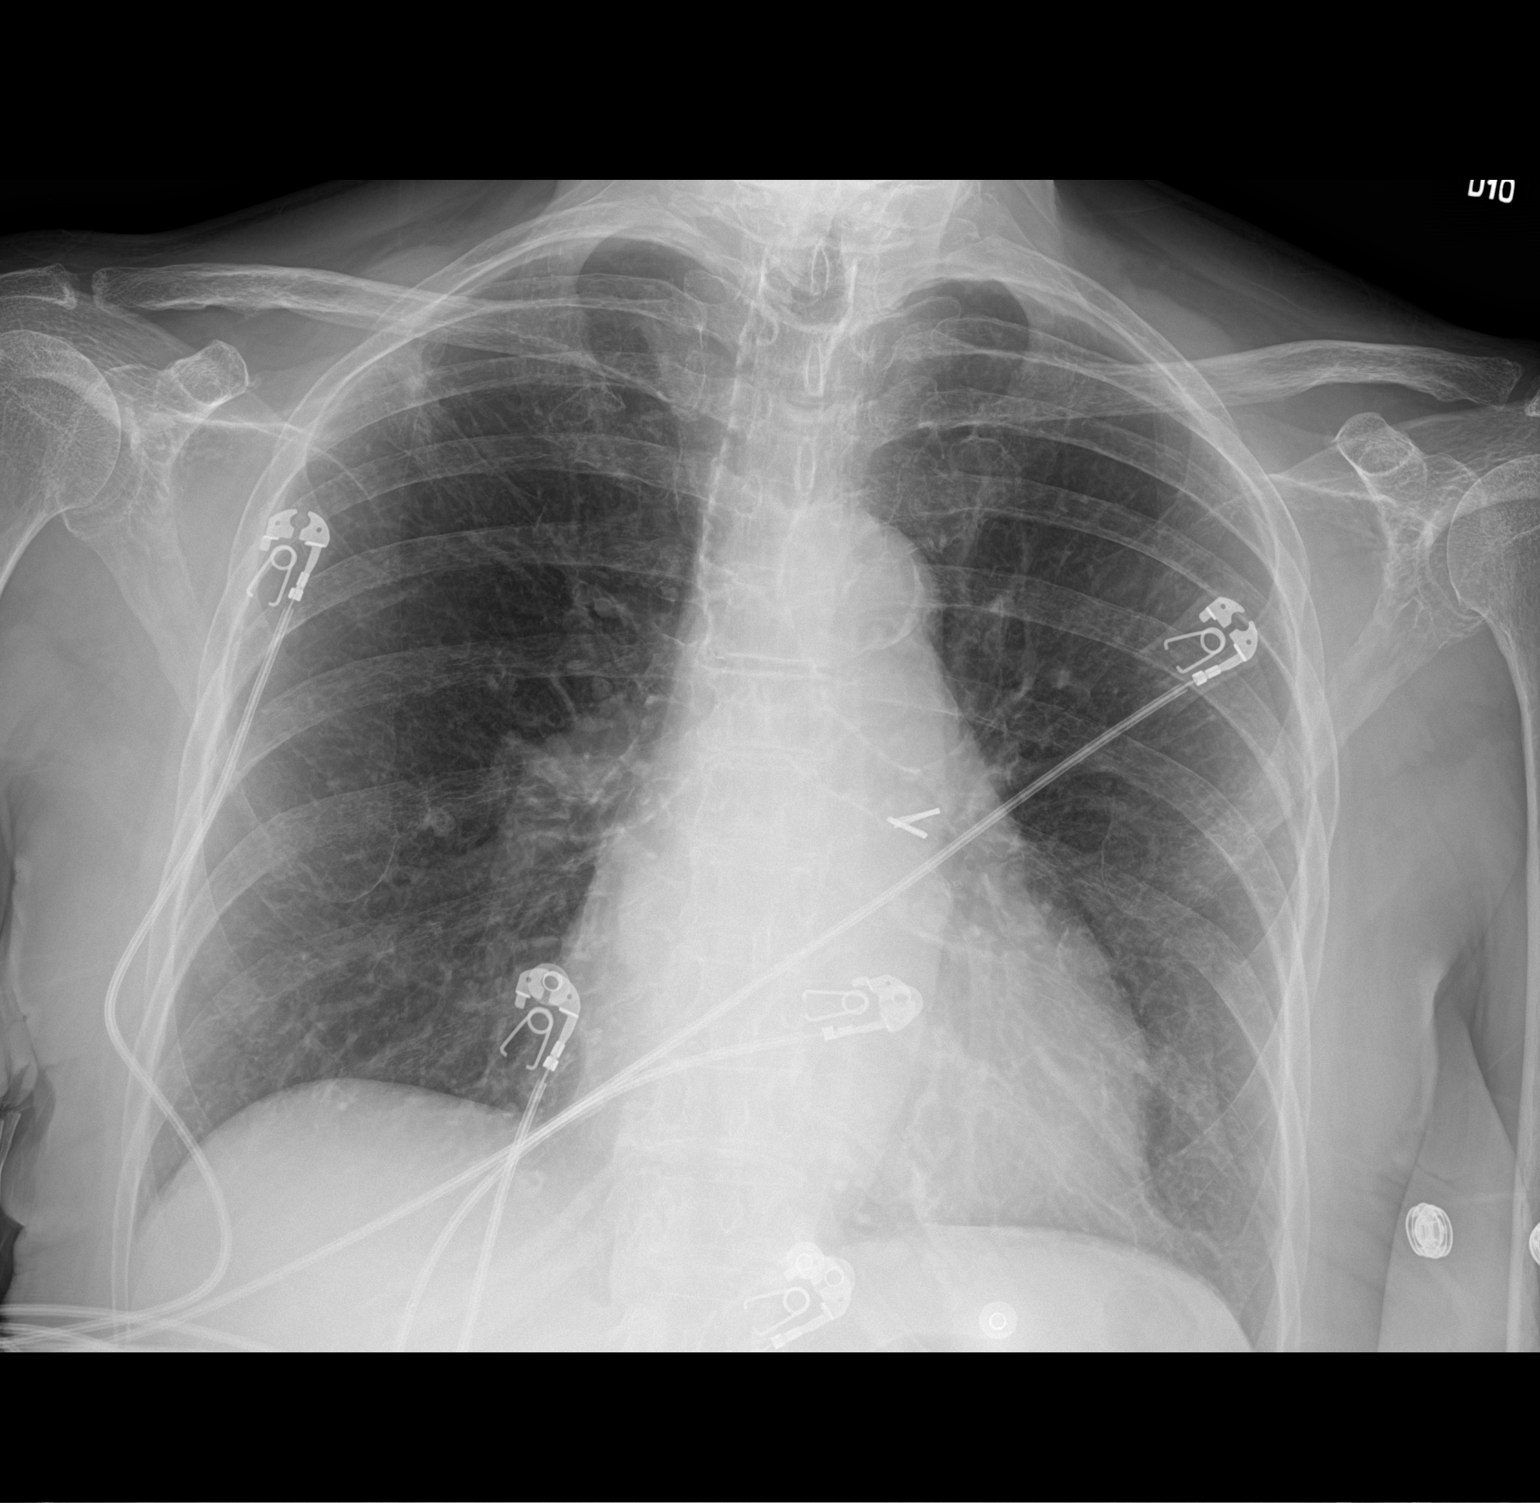

[1 of 1 positions shown; findings below may reference images not displayed]

FINDINGS: Normal heart size, mediastinal contours, and pulmonary vascularity.

Atherosclerotic calcification aorta.

Lungs clear.

No acute infiltrate, pleural effusion or pneumothorax.

Skin folds project over LEFT chest.

Bones demineralized.
IMPRESSION: No acute abnormalities.

## 2021-07-14 ENCOUNTER — Other Ambulatory Visit: Payer: Self-pay | Admitting: Cardiovascular Disease

## 2021-07-15 ENCOUNTER — Telehealth: Payer: Self-pay | Admitting: Family Medicine

## 2021-07-15 NOTE — Telephone Encounter (Signed)
..  Type of form received: Prescription Assistance form   Additional comments:   Received by: Havlyn Ratchford  Form should be Faxed to: [phone]  Form should be mailed to:    Is patient requesting call for pickup:   Form placed:  In provider's box  Attach charge sheet. Yes  Individual made aware of 3-5 business day turn around (Y/N)? Yes

## 2021-07-15 NOTE — Telephone Encounter (Signed)
Noted  

## 2021-07-25 ENCOUNTER — Other Ambulatory Visit (HOSPITAL_BASED_OUTPATIENT_CLINIC_OR_DEPARTMENT_OTHER): Payer: Self-pay | Admitting: Cardiovascular Disease

## 2021-07-25 NOTE — Telephone Encounter (Signed)
Rx(s) sent to pharmacy electronically.  

## 2021-07-28 ENCOUNTER — Other Ambulatory Visit (HOSPITAL_BASED_OUTPATIENT_CLINIC_OR_DEPARTMENT_OTHER): Payer: Self-pay | Admitting: Cardiovascular Disease

## 2021-07-30 DIAGNOSIS — H401132 Primary open-angle glaucoma, bilateral, moderate stage: Secondary | ICD-10-CM | POA: Diagnosis not present

## 2021-07-31 DIAGNOSIS — N390 Urinary tract infection, site not specified: Secondary | ICD-10-CM | POA: Diagnosis not present

## 2021-07-31 DIAGNOSIS — N184 Chronic kidney disease, stage 4 (severe): Secondary | ICD-10-CM | POA: Diagnosis not present

## 2021-07-31 DIAGNOSIS — I129 Hypertensive chronic kidney disease with stage 1 through stage 4 chronic kidney disease, or unspecified chronic kidney disease: Secondary | ICD-10-CM | POA: Diagnosis not present

## 2021-07-31 LAB — BASIC METABOLIC PANEL
BUN: 38 — AB (ref 4–21)
CO2: 27 — AB (ref 13–22)
Chloride: 104 (ref 99–108)
Creatinine: 1.5 — AB (ref 0.5–1.1)
Glucose: 100
Potassium: 4 mEq/L (ref 3.5–5.1)
Sodium: 138 (ref 137–147)

## 2021-07-31 LAB — COMPREHENSIVE METABOLIC PANEL
Albumin: 4.2 (ref 3.5–5.0)
Calcium: 9.3 (ref 8.7–10.7)
eGFR: 32

## 2021-07-31 LAB — CBC AND DIFFERENTIAL: Hemoglobin: 10.7 — AB (ref 12.0–16.0)

## 2021-08-05 ENCOUNTER — Encounter: Payer: Self-pay | Admitting: Family Medicine

## 2021-08-05 ENCOUNTER — Ambulatory Visit: Payer: Medicare Other | Admitting: Family Medicine

## 2021-08-05 VITALS — BP 136/56 | HR 55 | Temp 98.0°F | Ht 63.0 in | Wt 122.0 lb

## 2021-08-05 DIAGNOSIS — I480 Paroxysmal atrial fibrillation: Secondary | ICD-10-CM | POA: Diagnosis not present

## 2021-08-05 DIAGNOSIS — E785 Hyperlipidemia, unspecified: Secondary | ICD-10-CM

## 2021-08-05 DIAGNOSIS — N184 Chronic kidney disease, stage 4 (severe): Secondary | ICD-10-CM | POA: Diagnosis not present

## 2021-08-05 DIAGNOSIS — E039 Hypothyroidism, unspecified: Secondary | ICD-10-CM | POA: Diagnosis not present

## 2021-08-05 DIAGNOSIS — I10 Essential (primary) hypertension: Secondary | ICD-10-CM

## 2021-08-05 MED ORDER — FAMOTIDINE 10 MG PO TABS: 10.0000 mg | ORAL_TABLET | Freq: Two times a day (BID) | ORAL | 5 refills | Status: DC

## 2021-08-05 NOTE — Patient Instructions (Addendum)
Health Maintenance Due  Topic Date Due   Zoster Vaccines- Shingrix (1 of 2) Never done  Please check with your pharmacy to see if they have the shingrix vaccine. If they do- please get this immunization and update [place] by phone call or mychart with dates you receive the vaccine  More than happy to have your son as a patient- can schedule a new patient at the desk (40 minute slot)   Lets try pepcid/famotidine twice daily before meals to see if that helps with reflux symptoms which can cause/trigger chronic cough  If not better in 3-4 weeks let me order an x-ray for you of your chest  Recommended follow up: Return in about 3 months (around 11/05/2021) for physical or sooner if needed.Schedule b4 you leave.

## 2021-08-05 NOTE — Progress Notes (Signed)
Phone [phone] In person visit   Subjective:   [name] is a 88 y.o. year old very pleasant female patient who presents for/with See problem oriented charting Chief Complaint  Patient presents with   Follow-up    Pt states she has seen the kidney doctor.   Hyperlipidemia   Hypertension   Past Medical History-  Patient Active Problem List   Diagnosis Date Noted   COVID-19 10/12/2019    Priority: High   CKD (chronic kidney disease), stage IV ([place]) 06/15/2018    Priority: High   Chronic cough 08/12/2015    Priority: High   PAD (peripheral artery disease) ([place]) 05/10/2013    Priority: High   PAF (paroxysmal atrial fibrillation) ([place])     Priority: High   History of stroke     Priority: High   Carotid atherosclerosis 05/01/2021    Priority: Medium    Hyperglycemia 06/25/2014    Priority: Medium    Hypothyroidism 06/07/2014    Priority: Medium    Hyperlipidemia 06/07/2014    Priority: Medium    Hypertension     Priority: Medium    GERD (gastroesophageal reflux disease) 06/25/2014    Priority: Low   Vitamin D deficiency 06/25/2014    Priority: Low   Intracervical pessary 06/07/2014    Priority: Low   Sinus bradycardia 05/19/2013    Priority: Low   PAC (premature atrial contraction) 05/20/2011    Priority: Low   Diarrhea 02/13/2021   Anemia of chronic disease 12/14/2019   Low vitamin B12 level 12/09/2017   Anemia 08/16/2017   Perennial allergic rhinitis 08/12/2015    Medications- reviewed and updated Current Outpatient Medications  Medication Sig Dispense Refill   ARMOUR THYROID 15 MG tablet TAKE 1 TABLET BY MOUTH DAILY 90 tablet 1   chlorthalidone (HYGROTON) 25 MG tablet TAKE 1 TABLET BY MOUTH DAILY 90 tablet 2   Digestive Enzyme CAPS Take 1 capsule by mouth See admin instructions. Mix the contents of 1 capsule into water and drink with all meals     dorzolamide-timolol (COSOPT) 22.3-6.8 MG/ML ophthalmic solution Place 1 drop into the left eye in the  morning and at bedtime.      doxazosin (CARDURA) 2 MG tablet Take 0.5 tablets (1 mg total) by mouth daily. 45 tablet 2   ELIQUIS 2.5 MG TABS tablet TAKE 1 TABLET(2.5 MG) BY MOUTH TWICE DAILY 180 tablet 3   famotidine (PEPCID) 10 MG tablet Take 1 tablet (10 mg total) by mouth 2 (two) times daily. 60 tablet 5   hydrALAZINE (APRESOLINE) 25 MG tablet TAKE 1 TABLET BY MOUTH 3 TIMES  DAILY 270 tablet 2   irbesartan (AVAPRO) 300 MG tablet TAKE 1 TABLET(300 MG) BY MOUTH DAILY 90 tablet 3   loperamide (IMODIUM A-D) 2 MG tablet Take 1 tablet (2 mg total) by mouth 4 (four) times daily as needed for diarrhea or loose stools. 20 tablet 0   Multiple Vitamin (MULTIVITAMIN) LIQD Take 30 mLs by mouth daily.     Nutritional Supplements (FEEDING SUPPLEMENT, KATE FARMS STANDARD 1.4,) LIQD liquid Take 325 mLs by mouth 3 (three) times daily between meals.     Papaya TABS Take 3 tablets by mouth 3 (three) times daily.      Probiotic CAPS Take 1 capsule by mouth daily.     metoprolol tartrate (LOPRESSOR) 25 MG tablet Take 0.5 tablets (12.5 mg total) by mouth daily as needed (HR >110 take only BID). 180 tablet 3   No current facility-administered  medications for this visit.     Objective:  BP (!) 136/56   Pulse (!) 55   Temp 98 F (36.7 C)   Ht '5\' 3"'$  (1.6 m)   Wt 122 lb (55.3 kg)   SpO2 97%   BMI 21.61 kg/m  Gen: NAD, resting comfortably Oropharynx largely normal.  Nasal turbinates not edematous. CV: slightly bradycardic, no murmurs rubs or gallops Lungs: CTAB no crackles, wheeze, rhonchi Abdomen: soft/nontender/nondistended/normal bowel sounds.  Ext: no edema Skin: warm, dry     Assessment and Plan   # Atrial fibrillation S: Rate controlled with no rx Anticoagulated with eliquis 2.5 mg BID A/P: rate controlled even without med-s has metoprolol if needed. Anticoagulated appropriately  #hyperglycemia- needs a1c next visit  #CKD stage IV S: Renal function worsened after COVID-19 from stage III and  stage IV.  She reports she saw them recently-we are attempting to get records. Dr. Joelyn Oms saw her and reported no changes needed A/P: has been stable and saw nephrology- we are trying to get records today- for now continue current meds    #hypertension-has had issues with chronic cough with blood pressure medicines in the past S: medication: Chlorthalidone 25 mg, irbesartan 150 mg in the morning and 150 mg in the evening, hydralazine 25 mg 3 times daily prescribed-last visit she was working on getting up to 3 times a day, doxazosin 1 mg (half of a 2 mg tablet) - Metoprolol 12.5 mg twice daily only if heart rate gets high (hasnt happened recently_  - cough was better last visit then came back again. Feels like almost gagging. Tomato paste makes it worse. Thankfully weight stable. Diet changes in past helped cough but she lost too much weight. Worse when laying down- clear slick material  Home readings #s: blood pressures really vary at home as low as 100/40s to as high as 180s- most recently in last month between 115-158/46-69 and last reading at home 125/48.  BP Readings from Last 3 Encounters:  08/05/21 (!) 136/56  05/01/21 (!) 138/58  03/14/21 (!) 154/70  A/P: blood pressure reasonably controlled- continue current medicine  Cough may have reflux element- add pepcid 10 mg BID to see if helps- consider XCR if not improving  #hyperlipidemia with carotid atherosclerosis and PAD (no claudication) S: Medication:Last visit she agreed to try rosuvastatin 10 mg once a week try to work toward LDL 70 or less -had to stop due to some worsening muscle aches in legs Lab Results  Component Value Date   CHOL 193 01/14/2021   HDL 64.40 01/14/2021   LDLCALC 101 (H) 01/14/2021   TRIG 140.0 01/14/2021   CHOLHDL 3 01/14/2021   A/P: I thanked her for trying but we will hold off on medicine at this time since she had worsening symptoms   #hypothyroidism S: compliant On thyroid medication-Armour Thyroid 15  mg Lab Results  Component Value Date   TSH 3.86 02/25/2021   A/P:stable last check continue current meds   #Vaginal bleeding-patient saw GYN on 12/03/2020 and 06/05/2021-appears focus of conversation was around pessary- she has follow up and rechecks on this - no further vaginal bleeding- thinks it was related to prolapse irritatoin   #Vitamin D deficiency S: Medication: off as causes cough Last vitamin D Lab Results  Component Value Date   VD25OH 29.98 (L) 01/14/2021  A/P: recheck next visit but has not tolerated meds well due to worsening cough   Recommended follow up: Return in about 3 months (around 11/05/2021) for  physical or sooner if needed.Schedule b4 you leave. No future appointments.  Lab/Order associations: No diagnosis found.  Meds ordered this encounter  Medications   famotidine (PEPCID) 10 MG tablet    Sig: Take 1 tablet (10 mg total) by mouth 2 (two) times daily.    Dispense:  60 tablet    Refill:  5    Return precautions advised.  Garret Reddish, MD

## 2021-08-12 NOTE — Telephone Encounter (Signed)
Called and spoke with pt and made aware this has been filled out and faxed.

## 2021-08-12 NOTE — Telephone Encounter (Signed)
Patient wants to know if this was taken care of-

## 2021-08-25 ENCOUNTER — Other Ambulatory Visit: Payer: Self-pay

## 2021-08-25 MED ORDER — IRBESARTAN 300 MG PO TABS
ORAL_TABLET | ORAL | 3 refills | Status: DC
Start: 2021-08-25 — End: 2022-01-05

## 2021-10-31 ENCOUNTER — Other Ambulatory Visit (HOSPITAL_COMMUNITY): Payer: Self-pay | Admitting: Cardiovascular Disease

## 2021-10-31 ENCOUNTER — Ambulatory Visit (HOSPITAL_COMMUNITY)
Admission: RE | Admit: 2021-10-31 | Payer: Medicare Other | Source: Ambulatory Visit | Attending: Cardiovascular Disease | Admitting: Cardiovascular Disease

## 2021-10-31 DIAGNOSIS — I739 Peripheral vascular disease, unspecified: Secondary | ICD-10-CM

## 2021-11-04 ENCOUNTER — Ambulatory Visit: Payer: Medicare Other | Admitting: Family Medicine

## 2021-11-04 ENCOUNTER — Encounter: Payer: Self-pay | Admitting: Family Medicine

## 2021-11-04 VITALS — BP 138/60 | HR 53 | Temp 98.3°F | Ht 63.0 in | Wt 123.6 lb

## 2021-11-04 DIAGNOSIS — R739 Hyperglycemia, unspecified: Secondary | ICD-10-CM

## 2021-11-04 DIAGNOSIS — I10 Essential (primary) hypertension: Secondary | ICD-10-CM | POA: Diagnosis not present

## 2021-11-04 DIAGNOSIS — I480 Paroxysmal atrial fibrillation: Secondary | ICD-10-CM | POA: Diagnosis not present

## 2021-11-04 DIAGNOSIS — E039 Hypothyroidism, unspecified: Secondary | ICD-10-CM | POA: Diagnosis not present

## 2021-11-04 DIAGNOSIS — N184 Chronic kidney disease, stage 4 (severe): Secondary | ICD-10-CM

## 2021-11-04 LAB — COMPREHENSIVE METABOLIC PANEL
ALT: 9 U/L (ref 0–35)
AST: 15 U/L (ref 0–37)
Albumin: 3.8 g/dL (ref 3.5–5.2)
Alkaline Phosphatase: 63 U/L (ref 39–117)
BUN: 34 mg/dL — ABNORMAL HIGH (ref 6–23)
CO2: 24 mEq/L (ref 19–32)
Calcium: 8.8 mg/dL (ref 8.4–10.5)
Chloride: 106 mEq/L (ref 96–112)
Creatinine, Ser: 1.53 mg/dL — ABNORMAL HIGH (ref 0.40–1.20)
GFR: 29.09 mL/min — ABNORMAL LOW (ref >60.00)
Glucose, Bld: 111 mg/dL — ABNORMAL HIGH (ref 70–99)
Potassium: 4 mEq/L (ref 3.5–5.1)
Sodium: 138 mEq/L (ref 135–145)
Total Bilirubin: 0.5 mg/dL (ref 0.2–1.2)
Total Protein: 6.4 g/dL (ref 6.0–8.3)

## 2021-11-04 LAB — HEMOGLOBIN A1C: Hgb A1c MFr Bld: 6.1 % (ref 4.6–6.5)

## 2021-11-04 LAB — CBC WITH DIFFERENTIAL/PLATELET
Basophils Absolute: 0 10*3/uL (ref 0.0–0.1)
Basophils Relative: 0.3 % (ref 0.0–3.0)
Eosinophils Absolute: 0 10*3/uL (ref 0.0–0.7)
Eosinophils Relative: 1.4 % (ref 0.0–5.0)
HCT: 29.8 % — ABNORMAL LOW (ref 36.0–46.0)
Hemoglobin: 10 g/dL — ABNORMAL LOW (ref 12.0–15.0)
Lymphocytes Relative: 20.7 % (ref 12.0–46.0)
Lymphs Abs: 0.6 10*3/uL — ABNORMAL LOW (ref 0.7–4.0)
MCHC: 33.5 g/dL (ref 30.0–36.0)
MCV: 82.2 fl (ref 78.0–100.0)
Monocytes Absolute: 0.5 10*3/uL (ref 0.1–1.0)
Monocytes Relative: 16.1 % — ABNORMAL HIGH (ref 3.0–12.0)
Neutro Abs: 1.8 10*3/uL (ref 1.4–7.7)
Neutrophils Relative %: 61.5 % (ref 43.0–77.0)
Platelets: 129 10*3/uL — ABNORMAL LOW (ref 150.0–400.0)
RBC: 3.63 Mil/uL — ABNORMAL LOW (ref 3.87–5.11)
RDW: 14.6 % (ref 11.5–15.5)
WBC: 3 10*3/uL — ABNORMAL LOW (ref 4.0–10.5)

## 2021-11-04 LAB — TSH: TSH: 4.43 u[IU]/mL (ref 0.35–5.50)

## 2021-11-04 NOTE — Progress Notes (Signed)
Phone [phone] In person visit   Subjective:   [name] is a 88 y.o. year old very pleasant female patient who presents for/with See problem oriented charting Chief Complaint  Patient presents with   Follow-up   Hypertension   Hypothyroidism    Past Medical History-  Patient Active Problem List   Diagnosis Date Noted   COVID-19 10/12/2019    Priority: High   CKD (chronic kidney disease), stage IV ([place]) 06/15/2018    Priority: High   Chronic cough 08/12/2015    Priority: High   PAD (peripheral artery disease) ([place]) 05/10/2013    Priority: High   PAF (paroxysmal atrial fibrillation) ([place])     Priority: High   History of stroke     Priority: High   Carotid atherosclerosis 05/01/2021    Priority: Medium    Low vitamin B12 level 12/09/2017    Priority: Medium    Hyperglycemia 06/25/2014    Priority: Medium    Hypothyroidism 06/07/2014    Priority: Medium    Hyperlipidemia 06/07/2014    Priority: Medium    Hypertension     Priority: Medium    GERD (gastroesophageal reflux disease) 06/25/2014    Priority: Low   Vitamin D deficiency 06/25/2014    Priority: Low   Intracervical pessary 06/07/2014    Priority: Low   Sinus bradycardia 05/19/2013    Priority: Low   PAC (premature atrial contraction) 05/20/2011    Priority: Low   Diarrhea 02/13/2021   Anemia of chronic disease 12/14/2019   Anemia 08/16/2017   Perennial allergic rhinitis 08/12/2015    Medications- reviewed and updated Current Outpatient Medications  Medication Sig Dispense Refill   ARMOUR THYROID 15 MG tablet TAKE 1 TABLET BY MOUTH DAILY 90 tablet 1   chlorthalidone (HYGROTON) 25 MG tablet TAKE 1 TABLET BY MOUTH DAILY 90 tablet 2   Digestive Enzyme CAPS Take 1 capsule by mouth See admin instructions. Mix the contents of 1 capsule into water and drink with all meals     dorzolamide-timolol (COSOPT) 22.3-6.8 MG/ML ophthalmic solution Place 1 drop into the left eye in the morning and at  bedtime.      doxazosin (CARDURA) 2 MG tablet Take 0.5 tablets (1 mg total) by mouth daily. 45 tablet 2   ELIQUIS 2.5 MG TABS tablet TAKE 1 TABLET(2.5 MG) BY MOUTH TWICE DAILY 180 tablet 3   famotidine (PEPCID) 10 MG tablet Take 1 tablet (10 mg total) by mouth 2 (two) times daily. 60 tablet 5   hydrALAZINE (APRESOLINE) 25 MG tablet TAKE 1 TABLET BY MOUTH 3 TIMES  DAILY 270 tablet 2   irbesartan (AVAPRO) 300 MG tablet TAKE 1 TABLET(300 MG) BY MOUTH DAILY 90 tablet 3   loperamide (IMODIUM A-D) 2 MG tablet Take 1 tablet (2 mg total) by mouth 4 (four) times daily as needed for diarrhea or loose stools. 20 tablet 0   Multiple Vitamin (MULTIVITAMIN) LIQD Take 30 mLs by mouth daily.     Nutritional Supplements (FEEDING SUPPLEMENT, KATE FARMS STANDARD 1.4,) LIQD liquid Take 325 mLs by mouth 3 (three) times daily between meals.     Papaya TABS Take 3 tablets by mouth 3 (three) times daily.      Probiotic CAPS Take 1 capsule by mouth daily.     metoprolol tartrate (LOPRESSOR) 25 MG tablet Take 0.5 tablets (12.5 mg total) by mouth daily as needed (HR >110 take only BID). 180 tablet 3   No current facility-administered medications for this visit.  Objective:  BP 138/60   Pulse (!) 53   Temp 98.3 F (36.8 C)   Ht '5\' 3"'$  (1.6 m)   Wt 123 lb 9.6 oz (56.1 kg)   SpO2 98%   BMI 21.89 kg/m  Gen: NAD, resting comfortably Left Tm normal CV: RRR no murmurs rubs or gallops Lungs: CTAB no crackles, wheeze, rhonchi Ext: no edema Skin: warm, dry    Assessment and Plan   #Health maintenance-declines flu shot and COVID vaccination  # Atrial fibrillation S: Rate controlled without med-has metoprolol available if heart rate gets high Anticoagulated with Eliquis 2.5 mg twice daily A/P: appropriately anticoagulated- not rate controlled with medicine due to bradycardia at baseline     # Hyperglycemia/insulin resistance/prediabetes S:  Medication: none Exercise and diet- doing some walking up to 3x a  week, trying to eat reasonably healthy- did have some travel and didn't eat quite as well as normal this summer Lab Results  Component Value Date   HGBA1C 6.0 01/14/2021   HGBA1C 6.0 06/13/2020   HGBA1C 6.2 10/11/2018   A/P: hopefully stable- update a1c with labs   #Chronic kidney disease stage IV-follows with Dr. Joelyn Farmer S: GFR is typically in the 20s range-renal function worsened from stage III-IV after COVID 19 infection in the past- most recent GFR with DR. Sanford actually at 78- hoping can stay stable in that range -Patient knows to avoid NSAIDs  A/P: Controlled. Continue current medications.   #hypertension-issues with chronic cough on multiple blood pressure medicines in the past S: medication: Chlorthalidone 25 mg, irbesartan 150 mg in the morning 150 mg in the evening, hydralazine 25 mg 3 times a day prescribed as she typically gets at least twice daily, doxazosin 1 mg (half of 2 mg tablet) Home readings #s: 130s for the most part at home- can get higher if gets worked up  BP Readings from Last 3 Encounters:  11/04/21 138/60  08/05/21 (!) 136/56  05/01/21 (!) 138/58  A/P: Controlled. Continue current medications.  - tends to have lower diastolic   #Chronic cough we opted to try Pepcid 10 mg twice daily at last visit with plan for x-ray if no improvement-today she reports got better before travel and has not needed pepcid but long term history of coming and going   #hyperlipidemia S: Medication:None-rosuvastatin even once a week because muscle aches Lab Results  Component Value Date   CHOL 193 01/14/2021   HDL 64.40 01/14/2021   LDLCALC 101 (H) 01/14/2021   TRIG 140.0 01/14/2021   CHOLHDL 3 01/14/2021   A/P: mild poor control- remain off meds at her age  #hypothyroidism S: compliant On thyroid medication-Armour Thyroid 15 mg- missed single dose this am Lab Results  Component Value Date   TSH 3.86 02/25/2021    A/P: hopefully stable- update TSH  today. Continue  current meds for now    Recommended follow up: Return for next already scheduled visit or sooner if needed. Future Appointments  Date Time Provider [place]  11/07/2021  2:00 PM [facility] 2 [facility]  01/27/2022  9:20 AM [name], Brayton Mars, MD [facility]   Lab/Order associations:   ICD-10-CM   1. Primary hypertension  I10     2. Hypothyroidism, unspecified type  E03.9 TSH    3. CKD (chronic kidney disease), stage IV (HCC)  N18.4 Comprehensive metabolic panel    CBC with Differential/Platelet    4. PAF (paroxysmal atrial fibrillation) (HCC)  I48.0     5. Hyperglycemia  R73.9 HgB A1c      No orders of the defined types were placed in this encounter.   Return precautions advised.  Garret Reddish, MD

## 2021-11-04 NOTE — Patient Instructions (Addendum)
Please stop by lab before you go If you have mychart- we will send your results within 3 business days of us receiving them.  If you do not have mychart- we will call you about results within 5 business days of us receiving them.  *please also note that you will see labs on mychart as soon as they post. I will later go in and write notes on them- will say "notes from Dr. Maxton Noreen"   Recommended follow up: Return for next already scheduled visit or sooner if needed. 

## 2021-11-07 ENCOUNTER — Ambulatory Visit (HOSPITAL_COMMUNITY)
Admission: RE | Admit: 2021-11-07 | Discharge: 2021-11-07 | Disposition: A | Discharge status: Home / Self Care | Payer: Medicare Other | Source: Ambulatory Visit | Attending: Internal Medicine | Admitting: Internal Medicine

## 2021-11-07 DIAGNOSIS — I739 Peripheral vascular disease, unspecified: Secondary | ICD-10-CM

## 2021-11-11 ENCOUNTER — Other Ambulatory Visit: Payer: Self-pay

## 2021-11-11 DIAGNOSIS — I739 Peripheral vascular disease, unspecified: Secondary | ICD-10-CM

## 2021-12-01 ENCOUNTER — Other Ambulatory Visit: Payer: Self-pay | Admitting: Family Medicine

## 2022-01-02 ENCOUNTER — Telehealth: Payer: Self-pay | Admitting: Family Medicine

## 2022-01-02 NOTE — Telephone Encounter (Signed)
Agree with visit - scheduled next week

## 2022-01-02 NOTE — Telephone Encounter (Signed)
Caller states: -wants to speak to PCP directly about patient's medications.    Caller declined ov.

## 2022-01-02 NOTE — Telephone Encounter (Signed)
FYI, Called and spoke with pt daughter and she states pt has been coughing for 2 months now due to BP medication or "some of her medications" and pt can't continue to live coughing like this, pt daughter wants ALL of patients medications changed. Advised pt daughter that an OV with Dr. Yong Channel would be needed for this change. Call transferred to the front for scheduling.

## 2022-01-05 ENCOUNTER — Ambulatory Visit (INDEPENDENT_AMBULATORY_CARE_PROVIDER_SITE_OTHER): Payer: Medicare Other | Admitting: Family Medicine

## 2022-01-05 ENCOUNTER — Encounter: Payer: Self-pay | Admitting: Family Medicine

## 2022-01-05 VITALS — BP 140/54 | HR 61 | Temp 98.2°F | Ht 63.0 in | Wt 123.4 lb

## 2022-01-05 DIAGNOSIS — R053 Chronic cough: Secondary | ICD-10-CM | POA: Diagnosis not present

## 2022-01-05 DIAGNOSIS — I480 Paroxysmal atrial fibrillation: Secondary | ICD-10-CM

## 2022-01-05 DIAGNOSIS — I10 Essential (primary) hypertension: Secondary | ICD-10-CM | POA: Diagnosis not present

## 2022-01-05 MED ORDER — HYDRALAZINE HCL 25 MG PO TABS: 37.5000 mg | ORAL_TABLET | Freq: Three times a day (TID) | ORAL | 5 refills | Status: DC

## 2022-01-05 NOTE — Patient Instructions (Addendum)
STOP Irbesartan  Increase hydralazine to 25 mg 3x a day instead of 2x a day  Update me in 2 weeks with home readings and we can see if we need to increase to 1.5 tablets of 25 mg hydralazine 3x a day  Recommended follow up: Return in about 1 month (around 02/04/2022) for followup or sooner if needed.Schedule b4 you leave. -cancel December 5th visit on the way out so you can make your eye exam

## 2022-01-05 NOTE — Progress Notes (Signed)
Phone [phone] In person visit   Subjective:   [name] is a 88 y.o. year old very pleasant female patient who presents for/with See problem oriented charting Chief Complaint  Patient presents with   Medication Reaction    Follow up pt states medication she is taking my be causing coughing.   Past Medical History-  Patient Active Problem List   Diagnosis Date Noted   COVID-19 10/12/2019    Priority: High   CKD (chronic kidney disease), stage IV ([place]) 06/15/2018    Priority: High   Chronic cough 08/12/2015    Priority: High   PAD (peripheral artery disease) ([place]) 05/10/2013    Priority: High   PAF (paroxysmal atrial fibrillation) ([place])     Priority: High   History of stroke     Priority: High   Carotid atherosclerosis 05/01/2021    Priority: Medium    Low vitamin B12 level 12/09/2017    Priority: Medium    Hyperglycemia 06/25/2014    Priority: Medium    Hypothyroidism 06/07/2014    Priority: Medium    Hyperlipidemia 06/07/2014    Priority: Medium    Hypertension     Priority: Medium    GERD (gastroesophageal reflux disease) 06/25/2014    Priority: Low   Vitamin D deficiency 06/25/2014    Priority: Low   Intracervical pessary 06/07/2014    Priority: Low   Sinus bradycardia 05/19/2013    Priority: Low   PAC (premature atrial contraction) 05/20/2011    Priority: Low   Diarrhea 02/13/2021   Anemia of chronic disease 12/14/2019   Anemia 08/16/2017   Perennial allergic rhinitis 08/12/2015    Medications- reviewed and updated Current Outpatient Medications  Medication Sig Dispense Refill   ARMOUR THYROID 15 MG tablet TAKE 1 TABLET BY MOUTH DAILY 90 tablet 1   chlorthalidone (HYGROTON) 25 MG tablet TAKE 1 TABLET BY MOUTH DAILY 90 tablet 2   Digestive Enzyme CAPS Take 1 capsule by mouth See admin instructions. Mix the contents of 1 capsule into water and drink with all meals     dorzolamide-timolol (COSOPT) 22.3-6.8 MG/ML ophthalmic solution Place 1 drop  into the left eye in the morning and at bedtime.      doxazosin (CARDURA) 2 MG tablet Take 0.5 tablets (1 mg total) by mouth daily. 45 tablet 2   ELIQUIS 2.5 MG TABS tablet TAKE 1 TABLET(2.5 MG) BY MOUTH TWICE DAILY 180 tablet 3   famotidine (PEPCID) 10 MG tablet Take 1 tablet (10 mg total) by mouth 2 (two) times daily. 60 tablet 5   hydrALAZINE (APRESOLINE) 25 MG tablet Take 25 mg by mouth 3 (three) times daily.     loperamide (IMODIUM A-D) 2 MG tablet Take 1 tablet (2 mg total) by mouth 4 (four) times daily as needed for diarrhea or loose stools. 20 tablet 0   Multiple Vitamin (MULTIVITAMIN) LIQD Take 30 mLs by mouth daily.     Nutritional Supplements (FEEDING SUPPLEMENT, KATE FARMS STANDARD 1.4,) LIQD liquid Take 325 mLs by mouth 3 (three) times daily between meals.     Papaya TABS Take 3 tablets by mouth 3 (three) times daily.      Probiotic CAPS Take 1 capsule by mouth daily.     metoprolol tartrate (LOPRESSOR) 25 MG tablet Take 0.5 tablets (12.5 mg total) by mouth daily as needed (HR >110 take only BID). 180 tablet 3   No current facility-administered medications for this visit.     Objective:  BP (!) 140/54 (  BP Location: Left Arm, Patient Position: Sitting, Cuff Size: Normal)   Pulse 61   Temp 98.2 F (36.8 C) (Temporal)   Ht '5\' 3"'$  (1.6 m)   Wt 123 lb 6.4 oz (56 kg)   SpO2 97%   BMI 21.86 kg/m  Gen: NAD, resting comfortably Frequent cough during visit CV: RRR no murmurs rubs or gallops Lungs: CTAB no crackles, wheeze, rhonchi Ext: no edema Skin: warm, dry    Assessment and Plan   #Chronic Cough- started about 3 months ago other than short break when I saw her in September.  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 S: Rate controlled with metoprolol 12.5 mg up to BID if heart rate elevated  over 110 Anticoagulated with Eliquis 2.5 mg twice daily A/P:  appropriately anticoagulated and rate controlled- continue current medicine  #hypertension S: medication: chlorthalidone 25 mg, doxazosin 1 mg, hydralazine 25 mg 3 times a day (on rx but only taking twice a day), irbesartan 300 mg- half in am and half in pm Home readings #s:  140-150 as long as on meds BP Readings from Last 3 Encounters:  01/05/22 (!) 140/54  11/04/21 138/60  08/05/21 (!) 136/56   A/P: Blood pressure is poorly controlled but patient is very concerned that irbesartan could be causing chronic cough and wants to trial off of this From AVS "  Patient Instructions  STOP Irbesartan  Increase hydralazine to 25 mg 3x a day instead of 2x a day  Update me in 2 weeks with home readings and we can see if we need to increase to 1.5 tablets of 25 mg hydralazine 3x a day  Recommended follow up: Return in about 1 month (around 02/04/2022) for followup or sooner if needed.Schedule b4 you leave. -cancel December 5th visit on the way out so you can make your eye exam " -Our original plan was for patient to increase to 37.5 mg of hydralazine but she reported at the end of visit she was only taking hydralazine twice daily so we opted to increase to 3 times daily first - Also considered spironolactone but with GFR under 30 not ideal - Amlodipine caused significant swelling - Clonidine and beta-blockers caused significant bradycardia  -In regards to chronic cough-patient with multiple chest x-rays in the past and has even been referred to pulmonology but later she was able to reduce cough with dietary changes-I really think if this does not make substantial changes we need to consider chest x-ray repeat-last in 2021 or even CT or referral to pulmonology again-glad we have close follow-up next month  She actually decided to push physical out to April and see me for regular visit in December on 15th  Future Appointments  Date Time  Provider [place]  02/06/2022  3:00 PM [name], MD [facility]  05/26/2022  9:00 AM [name], MD [facility]    Lab/Order associations:   ICD-10-CM   1. Primary hypertension  I10     2. Chronic cough  R05.3     3. PAF (paroxysmal atrial fibrillation) (HCC)  I48.0       Meds ordered this encounter  Medications   DISCONTD: hydrALAZINE (APRESOLINE) 25 MG tablet    Sig: Take 1.5 tablets (37.5 mg total) by mouth 3 (three) times daily.    Dispense:  150  tablet    Refill:  5    Return precautions advised.  Garret Reddish, MD

## 2022-01-20 ENCOUNTER — Telehealth: Payer: Self-pay | Admitting: Family Medicine

## 2022-01-20 NOTE — Telephone Encounter (Signed)
..  Type of form received: patient assistance  Additional comments:   Received by: patient drop off  Form should be Faxed TG:[phone]  Form should be mailed to:  na  Is patient requesting call for pickup:yes    Form placed:   Dr hunter folder Attach charge sheet.  Yes  Individual made aware of 3-5 business day turn around (Y/N)?  Yes

## 2022-01-21 NOTE — Telephone Encounter (Signed)
Documents got from providers box today, will try to get completed and faxed by end of day.

## 2022-01-22 ENCOUNTER — Other Ambulatory Visit (HOSPITAL_BASED_OUTPATIENT_CLINIC_OR_DEPARTMENT_OTHER): Payer: Self-pay | Admitting: Cardiovascular Disease

## 2022-01-22 NOTE — Telephone Encounter (Signed)
Rx(s) sent to pharmacy electronically.  

## 2022-01-26 ENCOUNTER — Other Ambulatory Visit: Payer: Self-pay

## 2022-01-26 MED ORDER — APIXABAN 2.5 MG PO TABS: ORAL_TABLET | ORAL | 3 refills | Status: DC

## 2022-01-27 ENCOUNTER — Encounter: Payer: Medicare Other | Admitting: Family Medicine

## 2022-01-27 DIAGNOSIS — H401132 Primary open-angle glaucoma, bilateral, moderate stage: Secondary | ICD-10-CM | POA: Diagnosis not present

## 2022-01-29 ENCOUNTER — Other Ambulatory Visit (HOSPITAL_BASED_OUTPATIENT_CLINIC_OR_DEPARTMENT_OTHER): Payer: Self-pay | Admitting: Cardiovascular Disease

## 2022-02-06 ENCOUNTER — Ambulatory Visit (INDEPENDENT_AMBULATORY_CARE_PROVIDER_SITE_OTHER): Payer: Medicare Other | Admitting: Family Medicine

## 2022-02-06 ENCOUNTER — Encounter: Payer: Self-pay | Admitting: Family Medicine

## 2022-02-06 VITALS — BP 138/64 | HR 52 | Temp 98.3°F | Ht 63.0 in | Wt 128.8 lb

## 2022-02-06 DIAGNOSIS — I10 Essential (primary) hypertension: Secondary | ICD-10-CM | POA: Diagnosis not present

## 2022-02-06 DIAGNOSIS — I480 Paroxysmal atrial fibrillation: Secondary | ICD-10-CM | POA: Diagnosis not present

## 2022-02-06 NOTE — Progress Notes (Signed)
Phone [phone] In person visit   Subjective:   [name] is a 88 y.o. year old very pleasant female patient who presents for/with See problem oriented charting Chief Complaint  Patient presents with   Follow-up   Hypertension    Past Medical History-  Patient Active Problem List   Diagnosis Date Noted   COVID-19 10/12/2019    Priority: High   CKD (chronic kidney disease), stage IV ([place]) 06/15/2018    Priority: High   Chronic cough 08/12/2015    Priority: High   PAD (peripheral artery disease) ([place]) 05/10/2013    Priority: High   PAF (paroxysmal atrial fibrillation) ([place])     Priority: High   History of stroke     Priority: High   Carotid atherosclerosis 05/01/2021    Priority: Medium    Low vitamin B12 level 12/09/2017    Priority: Medium    Hyperglycemia 06/25/2014    Priority: Medium    Hypothyroidism 06/07/2014    Priority: Medium    Hyperlipidemia 06/07/2014    Priority: Medium    Hypertension     Priority: Medium    GERD (gastroesophageal reflux disease) 06/25/2014    Priority: Low   Vitamin D deficiency 06/25/2014    Priority: Low   Intracervical pessary 06/07/2014    Priority: Low   Sinus bradycardia 05/19/2013    Priority: Low   PAC (premature atrial contraction) 05/20/2011    Priority: Low   Diarrhea 02/13/2021   Anemia of chronic disease 12/14/2019   Anemia 08/16/2017   Perennial allergic rhinitis 08/12/2015    Medications- reviewed and updated Current Outpatient Medications  Medication Sig Dispense Refill   apixaban (ELIQUIS) 2.5 MG TABS tablet TAKE 1 TABLET(2.5 MG) BY MOUTH TWICE DAILY 180 tablet 3   ARMOUR THYROID 15 MG tablet TAKE 1 TABLET BY MOUTH DAILY 90 tablet 1   chlorthalidone (HYGROTON) 25 MG tablet Take 1 tablet (25 mg total) by mouth daily. Please call to schedule an overdue appointment with Dr. Angelena Form for refills, [phone], thank you. 1st attempt. 30 tablet 1   Digestive Enzyme CAPS Take 1 capsule by mouth See admin  instructions. Mix the contents of 1 capsule into water and drink with all meals     dorzolamide-timolol (COSOPT) 22.3-6.8 MG/ML ophthalmic solution Place 1 drop into the left eye in the morning and at bedtime.      doxazosin (CARDURA) 2 MG tablet TAKE ONE-HALF TABLET BY MOUTH AT BEDTIME 45 tablet 0   famotidine (PEPCID) 10 MG tablet Take 1 tablet (10 mg total) by mouth 2 (two) times daily. 60 tablet 5   hydrALAZINE (APRESOLINE) 25 MG tablet Take 25 mg by mouth 3 (three) times daily.     loperamide (IMODIUM A-D) 2 MG tablet Take 1 tablet (2 mg total) by mouth 4 (four) times daily as needed for diarrhea or loose stools. 20 tablet 0   Multiple Vitamin (MULTIVITAMIN) LIQD Take 30 mLs by mouth daily.     Nutritional Supplements (FEEDING SUPPLEMENT, KATE FARMS STANDARD 1.4,) LIQD liquid Take 325 mLs by mouth 3 (three) times daily between meals.     Papaya TABS Take 3 tablets by mouth 3 (three) times daily.      Probiotic CAPS Take 1 capsule by mouth daily.     metoprolol tartrate (LOPRESSOR) 25 MG tablet Take 0.5 tablets (12.5 mg total) by mouth daily as needed (HR >110 take only BID). 180 tablet 3   No current facility-administered medications for this visit.  Objective:  BP 138/64   Pulse (!) 52   Temp 98.3 F (36.8 C)   Ht '5\' 3"'$  (1.6 m)   Wt 128 lb 12.8 oz (58.4 kg)   SpO2 98%   BMI 22.82 kg/m  Gen: NAD, resting comfortably CV: Bradycardic but regular  Lungs: CTAB no crackles, wheeze, rhonchi Ext: Trace edema Skin: warm, dry     Assessment and Plan   # Atrial fibrillation with history of Maze procedure S: Rate controlled with metoprolol 12.5 mg up to BID if heart rate elevated over 110 Anticoagulated with Eliquis 2.5 mg twice daily A/P: appropriately anticoagulated and rate controlled (not needing meds)- continue current medicine  #hypertension S: medication: chlorthalidone 25 mg, doxazosin 2.5 mg, hydralazine 25 mg 3 times a day increased from twice a day last visit  (she  also states sometimes taking 1.5 mg tablets) - some increased swelling in the legs -STOPPED irbesartan 300 mg 12/2021 concerned abotu as cause of cough- finally calmed back down about a week ago. Also was triggering her back and that feels better as not coughing as much Home readings #s: 130s or 140s but some up to 150 BP Readings from Last 3 Encounters:  02/06/22 138/64  01/05/22 (!) 140/54  11/04/21 138/60  A/P: reasonable blood pressure control for her age and cough improved off irbesartan- we opted to continue current medications  Blood pressure lower but with side effects of multiple medications previously tried she would prefer to keep blood pressure slightly higher goal 140/90 instead of 130/80 which would be preferred for PAD and CKD and history of stroke  Recommended follow up: Return for next already scheduled visit or sooner if needed. Future Appointments  Date Time Provider [place]  05/26/2022  9:00 AM [name], MD [facility]   Lab/Order associations:   ICD-10-CM   1. PAF (paroxysmal atrial fibrillation) (HCC)  I48.0     2. Primary hypertension  I10       No orders of the defined types were placed in this encounter.   Return precautions advised.  Garret Reddish, MD

## 2022-02-06 NOTE — Assessment & Plan Note (Signed)
S: medication: chlorthalidone 25 mg, doxazosin 2.5 mg, hydralazine 25 mg 3 times a day increased from twice a day last visit  (she also states sometimes taking 1.5 mg tablets) - some increased swelling in the legs -STOPPED irbesartan 300 mg 12/2021 concerned abotu as cause of cough- finally calmed back down about a week ago. Also was triggering her back and that feels better as not coughing as much Home readings #s: 130s or 140s but some up to 150 BP Readings from Last 3 Encounters:  02/06/22 138/64  01/05/22 (!) 140/54  11/04/21 138/60  A/P: reasonable blood pressure control for her age and cough improved off irbesartan- we opted to continue current medications  Blood pressure lower but with side effects of multiple medications previously tried she would prefer to keep blood pressure slightly higher goal 140/90 instead of 130/80 which would be preferred for PAD and CKD and history of stroke

## 2022-02-06 NOTE — Patient Instructions (Addendum)
reasonable blood pressure control for her age and cough improved off irbesartan- we opted to continue current medications   Recommended follow up: Return for next already scheduled visit or sooner if needed.-We would like to push her

## 2022-03-21 DIAGNOSIS — S72002A Fracture of unspecified part of neck of left femur, initial encounter for closed fracture: Secondary | ICD-10-CM | POA: Diagnosis not present

## 2022-03-21 DIAGNOSIS — M25552 Pain in left hip: Secondary | ICD-10-CM | POA: Diagnosis not present

## 2022-03-26 ENCOUNTER — Telehealth: Payer: Self-pay | Admitting: Family Medicine

## 2022-03-26 DIAGNOSIS — M25552 Pain in left hip: Secondary | ICD-10-CM

## 2022-03-26 NOTE — Telephone Encounter (Signed)
The issue is to get this approved we likely need a copy of her films.  Are they able to get [place] a copy of those?  We would need the note as well.  An alternate option would be referring to sports medicine orthopedics and having them repeat films and then order CT if needed

## 2022-03-26 NOTE — Telephone Encounter (Signed)
Caller states: - While in [place], pt fell and went to UC  - UC provider wrote order for patient to have CT of left hip   Caller would like to know if Dr. Yong Channel could have this set up for her.

## 2022-03-26 NOTE — Telephone Encounter (Signed)
Do you want to see pt before ordering?

## 2022-03-27 NOTE — Telephone Encounter (Signed)
Called and spoke with pt son and he is in agreement with [place] placing a referral to ortho, referral has been placed.

## 2022-03-30 ENCOUNTER — Ambulatory Visit: Payer: Medicare Other | Attending: Cardiovascular Disease | Admitting: Cardiovascular Disease

## 2022-03-30 VITALS — BP 120/46 | HR 55 | Ht 63.0 in | Wt 141.0 lb

## 2022-03-30 DIAGNOSIS — I480 Paroxysmal atrial fibrillation: Secondary | ICD-10-CM

## 2022-03-30 DIAGNOSIS — I739 Peripheral vascular disease, unspecified: Secondary | ICD-10-CM

## 2022-03-30 DIAGNOSIS — I10 Essential (primary) hypertension: Secondary | ICD-10-CM | POA: Diagnosis not present

## 2022-03-30 NOTE — Patient Instructions (Signed)
Medication Instructions:  No changes *If you need a refill on your cardiac medications before your next appointment, please call your pharmacy*   Lab Work: none If you have labs (blood work) drawn today and your tests are completely normal, you will receive your results only by: [place] (if you have MyChart) OR A paper copy in the mail If you have any lab test that is abnormal or we need to change your treatment, we will call you to review the results.   Testing/Procedures: none   Follow-Up: At [facility], you and your health needs are our priority.  As part of our continuing mission to provide you with exceptional heart care, we have created designated Provider Care Teams.  These Care Teams include your primary Cardiologist (physician) and Advanced Practice Providers (APPs -  Physician Assistants and Nurse Practitioners) who all work together to provide you with the care you need, when you need it.   Your next appointment:   12 month(s)  Provider:   Lauree Chandler, MD     Other Instructions

## 2022-03-30 NOTE — Progress Notes (Signed)
Chief Complaint  Patient presents with   Follow-up    Paroxysmal atrial fibrillation   History of Present Illness: 89 yo female with history of PVCs, PACs, HTN, CVA, PAF s/p MAZE 2006, PAD who is here today for cardiac follow up. I have followed her for atrial fibrillation and HTN. Her HTN has been difficult to control over the years. She has not tolerated many medications. She has most recently tolerated Avapro, chlorthalidone and hydralazine well. She has not tolerated Norvasc, Coreg, Bystolic or clonidine. I met her at [facility] in February 2013 when she was admitted with a headache and found to have sinus brady with elevated BP. She had initially been tried on Bystolic but did not tolerate due to bradycardia. She was started on Norvasc in the hospital but had burning in her legs so she stopped it. Echo 04/02/11 with normal LV size and function. Event monitor in 2013 showed NSR with PACs. No evidence of atrial fib. Renal artery dopplers November 2013 without evidence of renal artery stenosis. She has PAD followed by Dr. Gwenlyn Found and underwent successful balloon/stenting of an occluded right popliteal artery per Dr. Gwenlyn Found in 2015. ABI stable Sept 2023. Echo June 2015 at [facility] with normal LV function. Cardiac monitor October 2017 with PVCs and PACs, no atrial fib. She was found to have atrial fibrillation in 2021 and was started on Cardizem and Eliqus. She has been seen in the HTN clinic for BP control. Renal artery dopplers normal in April 2022.   She is here today for follow up. The patient denies any chest pain, dyspnea, palpitations, lower extremity edema, orthopnea, PND, dizziness, near syncope or syncope.   Primary Care Physician:  [name], MD  Past Medical History:  Diagnosis Date   Arthritis    Chicken pox    Glaucoma of both eyes    High cholesterol    Hypertension    a. Normal renal arteries by duplex 2013.   Hypothyroidism    Migraine    PAD (peripheral artery  disease) ([place])    a. 04/2013: s/p PCI to occluded right popliteal artery    PAF (paroxysmal atrial fibrillation) ([place])    a. s/p MAZE 2006. b. Event monitor 2013: NSR with PACs.   Sinus bradycardia    a. 03/2011 during hospital admission - beta blocker stopped.   Stroke [facility])    a. Pt reports 3-4 prior strokes without residual sx. b. CVA 2013 (presented with headache) - started on Plavix. Event monitor as outpt - no AF.    Past Surgical History:  Procedure Laterality Date   CATARACT EXTRACTION Left 11/09/2017   LOWER EXTREMITY ANGIOGRAM N/A 05/15/2013   Procedure: LOWER EXTREMITY ANGIOGRAM;  Surgeon: Lorretta Harp, MD;  Location: [facility] CATH LAB;  Service: Cardiovascular;  Laterality: N/A;   MAZE  ~ 2006   POPLITEAL ARTERY STENT  05/15/2013    Current Outpatient Medications  Medication Sig Dispense Refill   apixaban (ELIQUIS) 2.5 MG TABS tablet TAKE 1 TABLET(2.5 MG) BY MOUTH TWICE DAILY 180 tablet 3   ARMOUR THYROID 15 MG tablet TAKE 1 TABLET BY MOUTH DAILY 90 tablet 1   chlorthalidone (HYGROTON) 25 MG tablet Take 1 tablet (25 mg total) by mouth daily. Please call to schedule an overdue appointment with Dr. Angelena Form for refills, [phone], thank you. 1st attempt. 30 tablet 1   Digestive Enzyme CAPS Take 1 capsule by mouth See admin instructions. Mix the contents of 1 capsule into water and drink  with all meals     dorzolamide-timolol (COSOPT) 22.3-6.8 MG/ML ophthalmic solution Place 1 drop into the left eye in the morning and at bedtime.      doxazosin (CARDURA) 2 MG tablet TAKE ONE-HALF TABLET BY MOUTH AT BEDTIME 45 tablet 0   hydrALAZINE (APRESOLINE) 25 MG tablet Take 25 mg by mouth 3 (three) times daily.     Multiple Vitamin (MULTIVITAMIN) LIQD Take 30 mLs by mouth daily.     Papaya TABS Take 3 tablets by mouth 3 (three) times daily.      Probiotic CAPS Take 1 capsule by mouth daily.     famotidine (PEPCID) 10 MG tablet Take 1 tablet (10 mg total) by mouth 2 (two) times daily.  (Patient not taking: Reported on 03/30/2022) 60 tablet 5   loperamide (IMODIUM A-D) 2 MG tablet Take 1 tablet (2 mg total) by mouth 4 (four) times daily as needed for diarrhea or loose stools. (Patient not taking: Reported on 03/30/2022) 20 tablet 0   metoprolol tartrate (LOPRESSOR) 25 MG tablet Take 0.5 tablets (12.5 mg total) by mouth daily as needed (HR >110 take only BID). 180 tablet 3   Nutritional Supplements (FEEDING SUPPLEMENT, KATE FARMS STANDARD 1.4,) LIQD liquid Take 325 mLs by mouth 3 (three) times daily between meals. (Patient not taking: Reported on 03/30/2022)     No current facility-administered medications for this visit.    Allergies  Allergen Reactions   Aceon [Perindopril Erbumine] Cough   Amlodipine Other (See Comments)    Lower extremity discomfort   Aspirin Cough and Other (See Comments)    STOPPED BY A MEDICAL PROFESSIONAL   Azor [Amlodipine-Olmesartan] Cough   Beta Adrenergic Blockers Other (See Comments)    Bradycardia   Hydralazine Nausea And Vomiting and Other (See Comments)    Weakness- tolerating in 2021    Milk-Related Compounds Other (See Comments) and Cough    STOPPED BY A MEDICAL PROFESSIONAL   Other Other (See Comments) and Cough    NUTS- ALL TYPES- (STOPPED BY A MEDICAL PROFESSIONAL) NO FOODS THAT CAUSE GAS (will cause A-Fib)   Peanut-Containing Drug Products Other (See Comments) and Cough    STOPPED BY A MEDICAL PROFESSIONAL   Sulfamethoxazole-Trimethoprim Other (See Comments)    Patient cannot specifically recall the reaction   Vitamin D Analogs Cough    Social History   Socioeconomic History   Marital status: Widowed    Spouse name: Not on file   Number of children: Not on file   Years of education: Not on file   Highest education level: Not on file  Occupational History   Not on file  Tobacco Use   Smoking status: Never   Smokeless tobacco: Never  Vaping Use   Vaping Use: Never used  Substance and Sexual Activity   Alcohol use: No     Alcohol/week: 0.0 standard drinks of alcohol   Drug use: No   Sexual activity: Never  Other Topics Concern   Not on file  Social History Narrative   Widowed. Lost husband to COVID-19 in September 2020. Married 65 years in 2016. [place] 22 years in 2016. 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 6 greatgrandkids.    Finished 10th grade.       Retired from working in [employer] for [employer]         Social Determinants of [employer]: [place]  (05/10/2020)   Overall Financial Resource Strain (CARDIA)  Difficulty of Paying Living Expenses: Not hard at all  Food Insecurity: No Food Insecurity (05/10/2020)   Hunger Vital Sign    Worried About Running Out of Food in the Last Year: Never true    Ran Out of Food in the Last Year: Never true  Transportation Needs: No Transportation Needs (05/10/2020)   PRAPARE - Hydrologist (Medical): No    Lack of Transportation (Non-Medical): No  Physical Activity: Not on file  Stress: Stress Concern Present (05/10/2020)   [place]    Feeling of Stress : To some extent  Social Connections: Not on file  Intimate Partner Violence: Not on file    Family History  Problem Relation Age of Onset   Hypertension Mother    Ulcers Father    Cervical cancer Sister    Throat cancer Brother    Lung cancer Brother    Cancer Brother    Allergic rhinitis Neg Hx    Angioedema Neg Hx    Asthma Neg Hx    Atopy Neg Hx    Eczema Neg Hx    Immunodeficiency Neg Hx    Urticaria Neg Hx     Review of Systems:  As stated in the HPI and otherwise negative.   BP (!) 120/46   Pulse (!) 55   Ht '5\' 3"'$  (1.6 m)   Wt 64 kg   SpO2 99%   BMI 24.98 kg/m   Physical Examination: General: Well developed, well nourished, NAD  HEENT: OP clear, mucus membranes moist  SKIN: warm, dry. No rashes. Neuro: No focal deficits   Musculoskeletal: Muscle strength 5/5 all ext  Psychiatric: Mood and affect normal  Neck: No JVD, no carotid bruits, no thyromegaly, no lymphadenopathy.  Lungs:Clear bilaterally, no wheezes, rhonci, crackles Cardiovascular: Regular rate and rhythm. No murmurs, gallops or rubs. Abdomen:Soft. Bowel sounds present. Non-tender.  Extremities: No lower extremity edema. Pulses are 2 + in the bilateral DP/PT.  Echo 08/02/13: Normal LV function, LVEF=55-60%. Trace TR  EKG:  EKG is  ordered today. The ekg ordered today demonstrates Sinus brady, rate 55 bpm. RBBB. Chronic T wave inversion  Recent Labs: 11/04/2021: ALT 9; BUN 34; Creatinine, Ser 1.53; Hemoglobin 10.0; Platelets 129.0; Potassium 4.0; Sodium 138; TSH 4.43   Lipid Panel    Component Value Date/Time   CHOL 193 01/14/2021 1359   TRIG 140.0 01/14/2021 1359   HDL 64.40 01/14/2021 1359   CHOLHDL 3 01/14/2021 1359   VLDL 28.0 01/14/2021 1359   LDLCALC 101 (H) 01/14/2021 1359   LDLCALC 133 (H) 12/06/2019 1500     Wt Readings from Last 3 Encounters:  03/30/22 64 kg  02/06/22 58.4 kg  01/05/22 56 kg    Assessment and Plan:   1. PAF/PACs/PVCs: Sinus today. She has had prior MAZE for PAF. Her CHADS VASC score is 7 (11.2% risk of stroke per year). Continue metoprolol and Eliquis.   2. HTN: BP is controlled. No changes today  3. PAD: She is followed in [facility] clinic by Dr. Gwenlyn Found. No leg pain. ABI normal in September 2023.   Labs/ tests ordered today include:   Orders Placed This Encounter  Procedures   EKG 12-Lead   Disposition:   F/U with me in 1 year.    Signed, Lauree Chandler, MD 03/30/2022 2:38 PM    [facility] [address], [place], [place]  [address] Phone: [phone]; Fax: [phone]

## 2022-04-06 ENCOUNTER — Other Ambulatory Visit (HOSPITAL_BASED_OUTPATIENT_CLINIC_OR_DEPARTMENT_OTHER): Payer: Self-pay | Admitting: Cardiovascular Disease

## 2022-04-07 DIAGNOSIS — N184 Chronic kidney disease, stage 4 (severe): Secondary | ICD-10-CM | POA: Diagnosis not present

## 2022-04-08 ENCOUNTER — Ambulatory Visit (INDEPENDENT_AMBULATORY_CARE_PROVIDER_SITE_OTHER): Payer: Medicare Other | Admitting: Orthopaedic Surgery

## 2022-04-08 ENCOUNTER — Ambulatory Visit (INDEPENDENT_AMBULATORY_CARE_PROVIDER_SITE_OTHER): Payer: Medicare Other

## 2022-04-08 DIAGNOSIS — M25552 Pain in left hip: Secondary | ICD-10-CM | POA: Diagnosis not present

## 2022-04-08 NOTE — Progress Notes (Signed)
Chief Complaint: Left hip pain     History of Present Illness:    [name] is a 89 y.o. female presents today after a fall 3 weeks prior at a [employer].  She did need help getting up at that time.  She subsequently has been able to reestablish the ability to walk with a walker.  She is very independent and lives with her son.  They live in a single-story home.  She is here today for further assessment.    Surgical History:   none  PMH/PSH/Family History/Social History/Meds/Allergies:    Past Medical History:  Diagnosis Date   Arthritis    Chicken pox    Glaucoma of both eyes    High cholesterol    Hypertension    a. Normal renal arteries by duplex 2013.   Hypothyroidism    Migraine    PAD (peripheral artery disease) ([place])    a. 04/2013: s/p PCI to occluded right popliteal artery    PAF (paroxysmal atrial fibrillation) ([place])    a. s/p MAZE 2006. b. Event monitor 2013: NSR with PACs.   Sinus bradycardia    a. 03/2011 during hospital admission - beta blocker stopped.   Stroke [facility])    a. Pt reports 3-4 prior strokes without residual sx. b. CVA 2013 (presented with headache) - started on Plavix. Event monitor as outpt - no AF.   Past Surgical History:  Procedure Laterality Date   CATARACT EXTRACTION Left 11/09/2017   LOWER EXTREMITY ANGIOGRAM N/A 05/15/2013   Procedure: LOWER EXTREMITY ANGIOGRAM;  Surgeon: Lorretta Harp, MD;  Location: [facility] CATH LAB;  Service: Cardiovascular;  Laterality: N/A;   MAZE  ~ 2006   POPLITEAL ARTERY STENT  05/15/2013   Social History   Socioeconomic History   Marital status: Widowed    Spouse name: Not on file   Number of children: Not on file   Years of education: Not on file   Highest education level: Not on file  Occupational History   Not on file  Tobacco Use   Smoking status: Never   Smokeless tobacco: Never  Vaping Use   Vaping Use: Never used  Substance and Sexual Activity    Alcohol use: No    Alcohol/week: 0.0 standard drinks of alcohol   Drug use: No   Sexual activity: Never  Other Topics Concern   Not on file  Social History Narrative   Widowed. Lost husband to COVID-19 in September 2020. Married 65 years in 2016. [place] 22 years in 2016. 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 6 greatgrandkids.    Finished 10th grade.       Retired from working in [employer] for [employer]         Social Determinants of [place] Strain: [place]  (05/10/2020)   Overall Financial Resource Strain (CARDIA)    Difficulty of Paying Living Expenses: Not hard at all  Food Insecurity: No Food Insecurity (05/10/2020)   Hunger Vital Sign    Worried About Running Out of Food in the Last Year: Never true    [place] in the Last Year: Never true  Transportation Needs: No Transportation Needs (05/10/2020)   PRAPARE - Hydrologist (Medical): No  Lack of Transportation (Non-Medical): No  Physical Activity: Not on file  Stress: Stress Concern Present (05/10/2020)   [place]    Feeling of Stress : To some extent  Social Connections: Not on file   Family History  Problem Relation Age of Onset   Hypertension Mother    Ulcers Father    Cervical cancer Sister    Throat cancer Brother    Lung cancer Brother    Cancer Brother    Allergic rhinitis Neg Hx    Angioedema Neg Hx    Asthma Neg Hx    Atopy Neg Hx    Eczema Neg Hx    Immunodeficiency Neg Hx    Urticaria Neg Hx    Allergies  Allergen Reactions   Aceon [Perindopril Erbumine] Cough   Amlodipine Other (See Comments)    Lower extremity discomfort   Aspirin Cough and Other (See Comments)    STOPPED BY A MEDICAL PROFESSIONAL   Azor [Amlodipine-Olmesartan] Cough   Beta Adrenergic Blockers Other (See Comments)    Bradycardia   Hydralazine Nausea And Vomiting and  Other (See Comments)    Weakness- tolerating in 2021    Milk-Related Compounds Other (See Comments) and Cough    STOPPED BY A MEDICAL PROFESSIONAL   Other Other (See Comments) and Cough    NUTS- ALL TYPES- (STOPPED BY A MEDICAL PROFESSIONAL) NO FOODS THAT CAUSE GAS (will cause A-Fib)   Peanut-Containing Drug Products Other (See Comments) and Cough    STOPPED BY A MEDICAL PROFESSIONAL   Sulfamethoxazole-Trimethoprim Other (See Comments)    Patient cannot specifically recall the reaction   Vitamin D Analogs Cough   Current Outpatient Medications  Medication Sig Dispense Refill   apixaban (ELIQUIS) 2.5 MG TABS tablet TAKE 1 TABLET(2.5 MG) BY MOUTH TWICE DAILY 180 tablet 3   ARMOUR THYROID 15 MG tablet TAKE 1 TABLET BY MOUTH DAILY 90 tablet 1   chlorthalidone (HYGROTON) 25 MG tablet Take 1 tablet (25 mg total) by mouth daily. 90 tablet 3   Digestive Enzyme CAPS Take 1 capsule by mouth See admin instructions. Mix the contents of 1 capsule into water and drink with all meals     dorzolamide-timolol (COSOPT) 22.3-6.8 MG/ML ophthalmic solution Place 1 drop into the left eye in the morning and at bedtime.      doxazosin (CARDURA) 2 MG tablet TAKE ONE-HALF TABLET BY MOUTH AT BEDTIME 45 tablet 0   famotidine (PEPCID) 10 MG tablet Take 1 tablet (10 mg total) by mouth 2 (two) times daily. (Patient not taking: Reported on 03/30/2022) 60 tablet 5   hydrALAZINE (APRESOLINE) 25 MG tablet Take 25 mg by mouth 3 (three) times daily.     loperamide (IMODIUM A-D) 2 MG tablet Take 1 tablet (2 mg total) by mouth 4 (four) times daily as needed for diarrhea or loose stools. (Patient not taking: Reported on 03/30/2022) 20 tablet 0   metoprolol tartrate (LOPRESSOR) 25 MG tablet Take 0.5 tablets (12.5 mg total) by mouth daily as needed (HR >110 take only BID). 180 tablet 3   Multiple Vitamin (MULTIVITAMIN) LIQD Take 30 mLs by mouth daily.     Nutritional Supplements (FEEDING SUPPLEMENT, KATE FARMS STANDARD 1.4,) LIQD  liquid Take 325 mLs by mouth 3 (three) times daily between meals. (Patient not taking: Reported on 03/30/2022)     Papaya TABS Take 3 tablets by mouth 3 (three) times daily.      Probiotic CAPS Take 1 capsule  by mouth daily.     No current facility-administered medications for this visit.   No results found.  Review of Systems:   A ROS was performed including pertinent positives and negatives as documented in the HPI.  Physical Exam :   Constitutional: NAD and appears stated age Neurological: Alert and oriented Psych: Appropriate affect and cooperative There were no vitals taken for this visit.   Comprehensive Musculoskeletal Exam:    There is tenderness with logroll about the left hip 30 degrees internal/external rotation of the hip.  She is weak and active flexion without [place] remainder of distal neurosensory exam is intact.  Limb lengths are equal walks with a walker  Imaging:   Xray (4 views left hip): Valgus impacted femoral neck fracture   I personally reviewed and interpreted the radiographs.   Assessment:   89 y.o. female with an impacted femoral neck fracture.  Overall she has been walking on this for 3 weeks and as a result I do believe this is stable.  I did describe the overall low risk of this displacing although I believe this is unlikely as she has been able to walk with a walker for the last 3 weeks.  At this time her pain is quite minimal.  Side effect I would like to follow her closely to allow for healing of this.  Will plan to order home health services to allow her to work on her gait.  Plan :    -Return to clinic in 3 weeks for reassess     I personally saw and evaluated the patient, and participated in the management and treatment plan.  Vanetta Mulders, MD Attending Physician, Orthopedic Surgery  This document was dictated using Dragon voice recognition software. A reasonable attempt at proof reading has been made to minimize errors.

## 2022-04-14 DIAGNOSIS — I129 Hypertensive chronic kidney disease with stage 1 through stage 4 chronic kidney disease, or unspecified chronic kidney disease: Secondary | ICD-10-CM | POA: Diagnosis not present

## 2022-04-14 DIAGNOSIS — N184 Chronic kidney disease, stage 4 (severe): Secondary | ICD-10-CM | POA: Diagnosis not present

## 2022-04-17 DIAGNOSIS — Z8673 Personal history of transient ischemic attack (TIA), and cerebral infarction without residual deficits: Secondary | ICD-10-CM | POA: Diagnosis not present

## 2022-04-17 DIAGNOSIS — I480 Paroxysmal atrial fibrillation: Secondary | ICD-10-CM | POA: Diagnosis not present

## 2022-04-17 DIAGNOSIS — I10 Essential (primary) hypertension: Secondary | ICD-10-CM | POA: Diagnosis not present

## 2022-04-17 DIAGNOSIS — E039 Hypothyroidism, unspecified: Secondary | ICD-10-CM | POA: Diagnosis not present

## 2022-04-17 DIAGNOSIS — E7800 Pure hypercholesterolemia, unspecified: Secondary | ICD-10-CM | POA: Diagnosis not present

## 2022-04-17 DIAGNOSIS — G43909 Migraine, unspecified, not intractable, without status migrainosus: Secondary | ICD-10-CM | POA: Diagnosis not present

## 2022-04-17 DIAGNOSIS — Z7901 Long term (current) use of anticoagulants: Secondary | ICD-10-CM | POA: Diagnosis not present

## 2022-04-17 DIAGNOSIS — M1990 Unspecified osteoarthritis, unspecified site: Secondary | ICD-10-CM | POA: Diagnosis not present

## 2022-04-17 DIAGNOSIS — S72002D Fracture of unspecified part of neck of left femur, subsequent encounter for closed fracture with routine healing: Secondary | ICD-10-CM | POA: Diagnosis not present

## 2022-04-17 DIAGNOSIS — Z9181 History of falling: Secondary | ICD-10-CM | POA: Diagnosis not present

## 2022-04-17 DIAGNOSIS — H409 Unspecified glaucoma: Secondary | ICD-10-CM | POA: Diagnosis not present

## 2022-04-17 DIAGNOSIS — I739 Peripheral vascular disease, unspecified: Secondary | ICD-10-CM | POA: Diagnosis not present

## 2022-04-20 ENCOUNTER — Telehealth: Payer: Self-pay | Admitting: Cardiovascular Disease

## 2022-04-20 NOTE — Telephone Encounter (Signed)
Reviewed with [facility] Therapist, Sharyn Lull.  She will let our office know if pt's HR is <50 or if patient has any symptoms from bradycardia.  Her metoprolol medication shows expired.  The patient does not have any.  The directions were only for half tab prn for HR >110.     No other concerns at this time.

## 2022-04-20 NOTE — Telephone Encounter (Signed)
STAT if HR is under 50 or over 120 (normal HR is 60-100 beats per minute)  What is your heart rate? 56  Do you have a log of your heart rate readings (document readings)?   No  Do you have any other symptoms?   No   Caller stated patient's HR readings have been 56 and she wants a call back to discuss patient's normal HR readings and what are the parameters where the cardiologist would want to be notified.  Caller also wanted to know if the patient should be taking metoprolol tartrate (LOPRESSOR) 25 MG tablet (Expired) as the patient has not taken this medication for a while.

## 2022-04-22 DIAGNOSIS — M1990 Unspecified osteoarthritis, unspecified site: Secondary | ICD-10-CM | POA: Diagnosis not present

## 2022-04-22 DIAGNOSIS — G43909 Migraine, unspecified, not intractable, without status migrainosus: Secondary | ICD-10-CM | POA: Diagnosis not present

## 2022-04-22 DIAGNOSIS — I739 Peripheral vascular disease, unspecified: Secondary | ICD-10-CM | POA: Diagnosis not present

## 2022-04-22 DIAGNOSIS — H409 Unspecified glaucoma: Secondary | ICD-10-CM | POA: Diagnosis not present

## 2022-04-22 DIAGNOSIS — E039 Hypothyroidism, unspecified: Secondary | ICD-10-CM | POA: Diagnosis not present

## 2022-04-22 DIAGNOSIS — Z7901 Long term (current) use of anticoagulants: Secondary | ICD-10-CM | POA: Diagnosis not present

## 2022-04-22 DIAGNOSIS — Z9181 History of falling: Secondary | ICD-10-CM | POA: Diagnosis not present

## 2022-04-22 DIAGNOSIS — I480 Paroxysmal atrial fibrillation: Secondary | ICD-10-CM | POA: Diagnosis not present

## 2022-04-22 DIAGNOSIS — E7800 Pure hypercholesterolemia, unspecified: Secondary | ICD-10-CM | POA: Diagnosis not present

## 2022-04-22 DIAGNOSIS — S72002D Fracture of unspecified part of neck of left femur, subsequent encounter for closed fracture with routine healing: Secondary | ICD-10-CM | POA: Diagnosis not present

## 2022-04-22 DIAGNOSIS — Z8673 Personal history of transient ischemic attack (TIA), and cerebral infarction without residual deficits: Secondary | ICD-10-CM | POA: Diagnosis not present

## 2022-04-22 DIAGNOSIS — I10 Essential (primary) hypertension: Secondary | ICD-10-CM | POA: Diagnosis not present

## 2022-04-27 ENCOUNTER — Other Ambulatory Visit (HOSPITAL_BASED_OUTPATIENT_CLINIC_OR_DEPARTMENT_OTHER): Payer: Self-pay | Admitting: Cardiovascular Disease

## 2022-04-28 DIAGNOSIS — Z9181 History of falling: Secondary | ICD-10-CM | POA: Diagnosis not present

## 2022-04-28 DIAGNOSIS — I739 Peripheral vascular disease, unspecified: Secondary | ICD-10-CM | POA: Diagnosis not present

## 2022-04-28 DIAGNOSIS — I10 Essential (primary) hypertension: Secondary | ICD-10-CM | POA: Diagnosis not present

## 2022-04-28 DIAGNOSIS — M1990 Unspecified osteoarthritis, unspecified site: Secondary | ICD-10-CM | POA: Diagnosis not present

## 2022-04-28 DIAGNOSIS — I480 Paroxysmal atrial fibrillation: Secondary | ICD-10-CM | POA: Diagnosis not present

## 2022-04-28 DIAGNOSIS — Z8673 Personal history of transient ischemic attack (TIA), and cerebral infarction without residual deficits: Secondary | ICD-10-CM | POA: Diagnosis not present

## 2022-04-28 DIAGNOSIS — Z7901 Long term (current) use of anticoagulants: Secondary | ICD-10-CM | POA: Diagnosis not present

## 2022-04-28 DIAGNOSIS — E039 Hypothyroidism, unspecified: Secondary | ICD-10-CM | POA: Diagnosis not present

## 2022-04-28 DIAGNOSIS — H409 Unspecified glaucoma: Secondary | ICD-10-CM | POA: Diagnosis not present

## 2022-04-28 DIAGNOSIS — E7800 Pure hypercholesterolemia, unspecified: Secondary | ICD-10-CM | POA: Diagnosis not present

## 2022-04-28 DIAGNOSIS — S72002D Fracture of unspecified part of neck of left femur, subsequent encounter for closed fracture with routine healing: Secondary | ICD-10-CM | POA: Diagnosis not present

## 2022-04-28 DIAGNOSIS — G43909 Migraine, unspecified, not intractable, without status migrainosus: Secondary | ICD-10-CM | POA: Diagnosis not present

## 2022-04-29 ENCOUNTER — Ambulatory Visit (INDEPENDENT_AMBULATORY_CARE_PROVIDER_SITE_OTHER): Payer: Medicare Other | Admitting: Orthopaedic Surgery

## 2022-04-29 ENCOUNTER — Ambulatory Visit (INDEPENDENT_AMBULATORY_CARE_PROVIDER_SITE_OTHER): Payer: Medicare Other

## 2022-04-29 DIAGNOSIS — M25552 Pain in left hip: Secondary | ICD-10-CM

## 2022-04-29 DIAGNOSIS — S72002A Fracture of unspecified part of neck of left femur, initial encounter for closed fracture: Secondary | ICD-10-CM | POA: Diagnosis not present

## 2022-04-29 NOTE — Progress Notes (Signed)
Chief Complaint: Left hip pain     History of Present Illness:   04/29/2022: [name] presents today doing quite well.  She is continuing to be able to ambulate with minimal pain about the left hip.  She is here today for further follow-up.   [name] is a 89 y.o. female presents today after a fall 3 weeks prior at a [employer].  She did need help getting up at that time.  She subsequently has been able to reestablish the ability to walk with a walker.  She is very independent and lives with her son.  They live in a single-story home.  She is here today for further assessment.    Surgical History:   none  PMH/PSH/Family History/Social History/Meds/Allergies:    Past Medical History:  Diagnosis Date   Arthritis    Chicken pox    Glaucoma of both eyes    High cholesterol    Hypertension    a. Normal renal arteries by duplex 2013.   Hypothyroidism    Migraine    PAD (peripheral artery disease) ([place])    a. 04/2013: s/p PCI to occluded right popliteal artery    PAF (paroxysmal atrial fibrillation) ([place])    a. s/p MAZE 2006. b. Event monitor 2013: NSR with PACs.   Sinus bradycardia    a. 03/2011 during hospital admission - beta blocker stopped.   Stroke [facility])    a. Pt reports 3-4 prior strokes without residual sx. b. CVA 2013 (presented with headache) - started on Plavix. Event monitor as outpt - no AF.   Past Surgical History:  Procedure Laterality Date   CATARACT EXTRACTION Left 11/09/2017   LOWER EXTREMITY ANGIOGRAM N/A 05/15/2013   Procedure: LOWER EXTREMITY ANGIOGRAM;  Surgeon: Lorretta Harp, MD;  Location: [facility] CATH LAB;  Service: Cardiovascular;  Laterality: N/A;   MAZE  ~ 2006   POPLITEAL ARTERY STENT  05/15/2013   Social History   Socioeconomic History   Marital status: Widowed    Spouse name: Not on file   Number of children: Not on file   Years of education: Not on file   Highest education level: Not on file   Occupational History   Not on file  Tobacco Use   Smoking status: Never   Smokeless tobacco: Never  Vaping Use   Vaping Use: Never used  Substance and Sexual Activity   Alcohol use: No    Alcohol/week: 0.0 standard drinks of alcohol   Drug use: No   Sexual activity: Never  Other Topics Concern   Not on file  Social History Narrative   Widowed. Lost husband to COVID-19 in September 2020. Married 65 years in 2016. [place] 22 years in 2016. 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 6 greatgrandkids.    Finished 10th grade.       Retired from working in [employer] for [employer]         Social Determinants of [place] Strain: [place]  (05/10/2020)   Overall Financial Resource Strain (CARDIA)    Difficulty of Paying Living Expenses: Not hard at all  Food Insecurity: No Food Insecurity (05/10/2020)   Hunger Vital Sign    Worried About Running Out of Food in the Last Year: Never true  Ran Out of Food in the Last Year: Never true  Transportation Needs: No Transportation Needs (05/10/2020)   PRAPARE - Hydrologist (Medical): No    Lack of Transportation (Non-Medical): No  Physical Activity: Not on file  Stress: Stress Concern Present (05/10/2020)   [place]    Feeling of Stress : To some extent  Social Connections: Not on file   Family History  Problem Relation Age of Onset   Hypertension Mother    Ulcers Father    Cervical cancer Sister    Throat cancer Brother    Lung cancer Brother    Cancer Brother    Allergic rhinitis Neg Hx    Angioedema Neg Hx    Asthma Neg Hx    Atopy Neg Hx    Eczema Neg Hx    Immunodeficiency Neg Hx    Urticaria Neg Hx    Allergies  Allergen Reactions   Aceon [Perindopril Erbumine] Cough   Amlodipine Other (See Comments)    Lower extremity discomfort   Aspirin Cough and Other (See Comments)     STOPPED BY A MEDICAL PROFESSIONAL   Azor [Amlodipine-Olmesartan] Cough   Beta Adrenergic Blockers Other (See Comments)    Bradycardia   Hydralazine Nausea And Vomiting and Other (See Comments)    Weakness- tolerating in 2021    Milk-Related Compounds Other (See Comments) and Cough    STOPPED BY A MEDICAL PROFESSIONAL   Other Other (See Comments) and Cough    NUTS- ALL TYPES- (STOPPED BY A MEDICAL PROFESSIONAL) NO FOODS THAT CAUSE GAS (will cause A-Fib)   Peanut-Containing Drug Products Other (See Comments) and Cough    STOPPED BY A MEDICAL PROFESSIONAL   Sulfamethoxazole-Trimethoprim Other (See Comments)    Patient cannot specifically recall the reaction   Vitamin D Analogs Cough   Current Outpatient Medications  Medication Sig Dispense Refill   apixaban (ELIQUIS) 2.5 MG TABS tablet TAKE 1 TABLET(2.5 MG) BY MOUTH TWICE DAILY 180 tablet 3   ARMOUR THYROID 15 MG tablet TAKE 1 TABLET BY MOUTH DAILY 90 tablet 1   chlorthalidone (HYGROTON) 25 MG tablet Take 1 tablet (25 mg total) by mouth daily. 90 tablet 3   Digestive Enzyme CAPS Take 1 capsule by mouth See admin instructions. Mix the contents of 1 capsule into water and drink with all meals     dorzolamide-timolol (COSOPT) 22.3-6.8 MG/ML ophthalmic solution Place 1 drop into the left eye in the morning and at bedtime.      doxazosin (CARDURA) 2 MG tablet TAKE ONE-HALF TABLET BY MOUTH AT BEDTIME 45 tablet 2   famotidine (PEPCID) 10 MG tablet Take 1 tablet (10 mg total) by mouth 2 (two) times daily. (Patient not taking: Reported on 03/30/2022) 60 tablet 5   hydrALAZINE (APRESOLINE) 25 MG tablet Take 25 mg by mouth 3 (three) times daily.     loperamide (IMODIUM A-D) 2 MG tablet Take 1 tablet (2 mg total) by mouth 4 (four) times daily as needed for diarrhea or loose stools. (Patient not taking: Reported on 03/30/2022) 20 tablet 0   metoprolol tartrate (LOPRESSOR) 25 MG tablet Take 0.5 tablets (12.5 mg total) by mouth daily as needed (HR >110 take  only BID). 180 tablet 3   Multiple Vitamin (MULTIVITAMIN) LIQD Take 30 mLs by mouth daily.     Nutritional Supplements (FEEDING SUPPLEMENT, KATE FARMS STANDARD 1.4,) LIQD liquid Take 325 mLs by mouth 3 (three) times daily  between meals. (Patient not taking: Reported on 03/30/2022)     Papaya TABS Take 3 tablets by mouth 3 (three) times daily.      Probiotic CAPS Take 1 capsule by mouth daily.     No current facility-administered medications for this visit.   No results found.  Review of Systems:   A ROS was performed including pertinent positives and negatives as documented in the HPI.  Physical Exam :   Constitutional: NAD and appears stated age Neurological: Alert and oriented Psych: Appropriate affect and cooperative There were no vitals taken for this visit.   Comprehensive Musculoskeletal Exam:    There is tenderness with logroll about the left hip 30 degrees internal/external rotation of the hip.  She is weak and active flexion without [place] remainder of distal neurosensory exam is intact.  Limb lengths are equal walks with a walker  Imaging:   Xray (4 views left hip): Valgus impacted femoral neck fracture   I personally reviewed and interpreted the radiographs.   Assessment:   89 y.o. female with an impacted femoral neck fracture.  X-rays today show consistent fracture healing without any interval displacement.  To that effect she may continue to be activity as tolerated.  I will see her back in 6 weeks for reassessment  Plan :    -Return to clinic in 6 weeks for reassessment     I personally saw and evaluated the patient, and participated in the management and treatment plan.  Vanetta Mulders, MD Attending Physician, Orthopedic Surgery  This document was dictated using Dragon voice recognition software. A reasonable attempt at proof reading has been made to minimize errors.

## 2022-05-08 DIAGNOSIS — G43909 Migraine, unspecified, not intractable, without status migrainosus: Secondary | ICD-10-CM | POA: Diagnosis not present

## 2022-05-08 DIAGNOSIS — Z9181 History of falling: Secondary | ICD-10-CM | POA: Diagnosis not present

## 2022-05-08 DIAGNOSIS — H409 Unspecified glaucoma: Secondary | ICD-10-CM | POA: Diagnosis not present

## 2022-05-08 DIAGNOSIS — I10 Essential (primary) hypertension: Secondary | ICD-10-CM | POA: Diagnosis not present

## 2022-05-08 DIAGNOSIS — I739 Peripheral vascular disease, unspecified: Secondary | ICD-10-CM | POA: Diagnosis not present

## 2022-05-08 DIAGNOSIS — E039 Hypothyroidism, unspecified: Secondary | ICD-10-CM | POA: Diagnosis not present

## 2022-05-08 DIAGNOSIS — Z8673 Personal history of transient ischemic attack (TIA), and cerebral infarction without residual deficits: Secondary | ICD-10-CM | POA: Diagnosis not present

## 2022-05-08 DIAGNOSIS — Z7901 Long term (current) use of anticoagulants: Secondary | ICD-10-CM | POA: Diagnosis not present

## 2022-05-08 DIAGNOSIS — S72002D Fracture of unspecified part of neck of left femur, subsequent encounter for closed fracture with routine healing: Secondary | ICD-10-CM | POA: Diagnosis not present

## 2022-05-08 DIAGNOSIS — E7800 Pure hypercholesterolemia, unspecified: Secondary | ICD-10-CM | POA: Diagnosis not present

## 2022-05-08 DIAGNOSIS — I480 Paroxysmal atrial fibrillation: Secondary | ICD-10-CM | POA: Diagnosis not present

## 2022-05-08 DIAGNOSIS — M1990 Unspecified osteoarthritis, unspecified site: Secondary | ICD-10-CM | POA: Diagnosis not present

## 2022-05-15 ENCOUNTER — Ambulatory Visit (INDEPENDENT_AMBULATORY_CARE_PROVIDER_SITE_OTHER): Payer: Medicare Other | Admitting: Orthopaedic Surgery

## 2022-05-15 ENCOUNTER — Ambulatory Visit (INDEPENDENT_AMBULATORY_CARE_PROVIDER_SITE_OTHER): Payer: Medicare Other

## 2022-05-15 DIAGNOSIS — M25552 Pain in left hip: Secondary | ICD-10-CM | POA: Diagnosis not present

## 2022-05-15 DIAGNOSIS — S72002A Fracture of unspecified part of neck of left femur, initial encounter for closed fracture: Secondary | ICD-10-CM | POA: Diagnosis not present

## 2022-05-15 NOTE — Progress Notes (Signed)
Chief Complaint: Left hip pain     History of Present Illness:   04/29/2022: Presents today for follow-up of her left hip.  She is now walking without any assistive devices.  She does have some soreness in the proximal thigh which has been aggravated with physical therapy.   [name] is a 89 y.o. female presents today after a fall 3 weeks prior at a [employer].  She did need help getting up at that time.  She subsequently has been able to reestablish the ability to walk with a walker.  She is very independent and lives with her son.  They live in a single-story home.  She is here today for further assessment.    Surgical History:   none  PMH/PSH/Family History/Social History/Meds/Allergies:    Past Medical History:  Diagnosis Date   Arthritis    Chicken pox    Glaucoma of both eyes    High cholesterol    Hypertension    a. Normal renal arteries by duplex 2013.   Hypothyroidism    Migraine    PAD (peripheral artery disease) ([place])    a. 04/2013: s/p PCI to occluded right popliteal artery    PAF (paroxysmal atrial fibrillation) ([place])    a. s/p MAZE 2006. b. Event monitor 2013: NSR with PACs.   Sinus bradycardia    a. 03/2011 during hospital admission - beta blocker stopped.   Stroke [facility])    a. Pt reports 3-4 prior strokes without residual sx. b. CVA 2013 (presented with headache) - started on Plavix. Event monitor as outpt - no AF.   Past Surgical History:  Procedure Laterality Date   CATARACT EXTRACTION Left 11/09/2017   LOWER EXTREMITY ANGIOGRAM N/A 05/15/2013   Procedure: LOWER EXTREMITY ANGIOGRAM;  Surgeon: Lorretta Harp, MD;  Location: [facility] CATH LAB;  Service: Cardiovascular;  Laterality: N/A;   MAZE  ~ 2006   POPLITEAL ARTERY STENT  05/15/2013   Social History   Socioeconomic History   Marital status: Widowed    Spouse name: Not on file   Number of children: Not on file   Years of education: Not on file   Highest  education level: Not on file  Occupational History   Not on file  Tobacco Use   Smoking status: Never   Smokeless tobacco: Never  Vaping Use   Vaping Use: Never used  Substance and Sexual Activity   Alcohol use: No    Alcohol/week: 0.0 standard drinks of alcohol   Drug use: No   Sexual activity: Never  Other Topics Concern   Not on file  Social History Narrative   Widowed. Lost husband to COVID-19 in September 2020. Married 65 years in 2016. [place] 22 years in 2016. 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 6 greatgrandkids.    Finished 10th grade.       Retired from working in [employer] for [employer]         Social Determinants of [place] Strain: [place]  (05/10/2020)   Overall Financial Resource Strain (CARDIA)    Difficulty of Paying Living Expenses: Not hard at all  Food Insecurity: No Food Insecurity (05/10/2020)   Hunger Vital Sign    Worried About Running Out of Food in the Last Year:  Never true    Ran Out of Food in the Last Year: Never true  Transportation Needs: No Transportation Needs (05/10/2020)   PRAPARE - Hydrologist (Medical): No    Lack of Transportation (Non-Medical): No  Physical Activity: Not on file  Stress: Stress Concern Present (05/10/2020)   [place]    Feeling of Stress : To some extent  Social Connections: Not on file   Family History  Problem Relation Age of Onset   Hypertension Mother    Ulcers Father    Cervical cancer Sister    Throat cancer Brother    Lung cancer Brother    Cancer Brother    Allergic rhinitis Neg Hx    Angioedema Neg Hx    Asthma Neg Hx    Atopy Neg Hx    Eczema Neg Hx    Immunodeficiency Neg Hx    Urticaria Neg Hx    Allergies  Allergen Reactions   Aceon [Perindopril Erbumine] Cough   Amlodipine Other (See Comments)    Lower extremity discomfort   Aspirin Cough  and Other (See Comments)    STOPPED BY A MEDICAL PROFESSIONAL   Azor [Amlodipine-Olmesartan] Cough   Beta Adrenergic Blockers Other (See Comments)    Bradycardia   Hydralazine Nausea And Vomiting and Other (See Comments)    Weakness- tolerating in 2021    Milk-Related Compounds Other (See Comments) and Cough    STOPPED BY A MEDICAL PROFESSIONAL   Other Other (See Comments) and Cough    NUTS- ALL TYPES- (STOPPED BY A MEDICAL PROFESSIONAL) NO FOODS THAT CAUSE GAS (will cause A-Fib)   Peanut-Containing Drug Products Other (See Comments) and Cough    STOPPED BY A MEDICAL PROFESSIONAL   Sulfamethoxazole-Trimethoprim Other (See Comments)    Patient cannot specifically recall the reaction   Vitamin D Analogs Cough   Current Outpatient Medications  Medication Sig Dispense Refill   apixaban (ELIQUIS) 2.5 MG TABS tablet TAKE 1 TABLET(2.5 MG) BY MOUTH TWICE DAILY 180 tablet 3   ARMOUR THYROID 15 MG tablet TAKE 1 TABLET BY MOUTH DAILY 90 tablet 1   chlorthalidone (HYGROTON) 25 MG tablet Take 1 tablet (25 mg total) by mouth daily. 90 tablet 3   Digestive Enzyme CAPS Take 1 capsule by mouth See admin instructions. Mix the contents of 1 capsule into water and drink with all meals     dorzolamide-timolol (COSOPT) 22.3-6.8 MG/ML ophthalmic solution Place 1 drop into the left eye in the morning and at bedtime.      doxazosin (CARDURA) 2 MG tablet TAKE ONE-HALF TABLET BY MOUTH AT BEDTIME 45 tablet 2   famotidine (PEPCID) 10 MG tablet Take 1 tablet (10 mg total) by mouth 2 (two) times daily. (Patient not taking: Reported on 03/30/2022) 60 tablet 5   hydrALAZINE (APRESOLINE) 25 MG tablet Take 25 mg by mouth 3 (three) times daily.     loperamide (IMODIUM A-D) 2 MG tablet Take 1 tablet (2 mg total) by mouth 4 (four) times daily as needed for diarrhea or loose stools. (Patient not taking: Reported on 03/30/2022) 20 tablet 0   metoprolol tartrate (LOPRESSOR) 25 MG tablet Take 0.5 tablets (12.5 mg total) by mouth  daily as needed (HR >110 take only BID). 180 tablet 3   Multiple Vitamin (MULTIVITAMIN) LIQD Take 30 mLs by mouth daily.     Nutritional Supplements (FEEDING SUPPLEMENT, KATE FARMS STANDARD 1.4,) LIQD liquid Take 325 mLs by  mouth 3 (three) times daily between meals. (Patient not taking: Reported on 03/30/2022)     Papaya TABS Take 3 tablets by mouth 3 (three) times daily.      Probiotic CAPS Take 1 capsule by mouth daily.     No current facility-administered medications for this visit.   No results found.  Review of Systems:   A ROS was performed including pertinent positives and negatives as documented in the HPI.  Physical Exam :   Constitutional: NAD and appears stated age Neurological: Alert and oriented Psych: Appropriate affect and cooperative There were no vitals taken for this visit.   Comprehensive Musculoskeletal Exam:    There is tenderness with logroll about the left hip 30 degrees internal/external rotation of the hip.  She is weak and active flexion without remainder of distal neurosensory exam is intact.  Limb lengths are equal walks with a walker  Imaging:   Xray (4 views left hip): Valgus impacted femoral neck fracture   I personally reviewed and interpreted the radiographs.   Assessment:   89 y.o. female with an impacted femoral neck fracture.  X-rays today show consistent fracture healing without any interval displacement.  Overall I did describe that given the fact that she is having pain and not able to return to full activity that she may be a candidate for total hip arthroplasty.  That being said she really would like to avoid this.  She still has not had any interval displacement and there is healing although a mild step-off particularly on the lateral view of her impacted femoral neck fracture.  This time we will plan for continued watching for an additional 2 weeks and I will see her back at that point  Plan :    -Return to clinic in 2 weeks for  reassessment     I personally saw and evaluated the patient, and participated in the management and treatment plan.  Vanetta Mulders, MD Attending Physician, Orthopedic Surgery  This document was dictated using Dragon voice recognition software. A reasonable attempt at proof reading has been made to minimize errors.

## 2022-05-26 ENCOUNTER — Encounter: Payer: Self-pay | Admitting: Family Medicine

## 2022-05-26 ENCOUNTER — Ambulatory Visit (INDEPENDENT_AMBULATORY_CARE_PROVIDER_SITE_OTHER): Payer: Medicare Other | Admitting: Family Medicine

## 2022-05-26 VITALS — BP 132/56 | HR 54 | Temp 97.0°F | Ht 63.0 in | Wt 121.0 lb

## 2022-05-26 DIAGNOSIS — E538 Deficiency of other specified B group vitamins: Secondary | ICD-10-CM | POA: Diagnosis not present

## 2022-05-26 DIAGNOSIS — N184 Chronic kidney disease, stage 4 (severe): Secondary | ICD-10-CM

## 2022-05-26 DIAGNOSIS — Z0000 Encounter for general adult medical examination without abnormal findings: Secondary | ICD-10-CM

## 2022-05-26 DIAGNOSIS — E785 Hyperlipidemia, unspecified: Secondary | ICD-10-CM

## 2022-05-26 DIAGNOSIS — Z131 Encounter for screening for diabetes mellitus: Secondary | ICD-10-CM | POA: Diagnosis not present

## 2022-05-26 DIAGNOSIS — E039 Hypothyroidism, unspecified: Secondary | ICD-10-CM | POA: Diagnosis not present

## 2022-05-26 DIAGNOSIS — E559 Vitamin D deficiency, unspecified: Secondary | ICD-10-CM | POA: Diagnosis not present

## 2022-05-26 DIAGNOSIS — R7989 Other specified abnormal findings of blood chemistry: Secondary | ICD-10-CM

## 2022-05-26 DIAGNOSIS — R739 Hyperglycemia, unspecified: Secondary | ICD-10-CM | POA: Diagnosis not present

## 2022-05-26 LAB — CBC WITH DIFFERENTIAL/PLATELET
Basophils Absolute: 0 10*3/uL (ref 0.0–0.1)
Basophils Relative: 0.4 % (ref 0.0–3.0)
Eosinophils Absolute: 0.1 10*3/uL (ref 0.0–0.7)
Eosinophils Relative: 2.3 % (ref 0.0–5.0)
HCT: 29.7 % — ABNORMAL LOW (ref 36.0–46.0)
Hemoglobin: 9.6 g/dL — ABNORMAL LOW (ref 12.0–15.0)
Lymphocytes Relative: 22.8 % (ref 12.0–46.0)
Lymphs Abs: 0.8 10*3/uL (ref 0.7–4.0)
MCHC: 32.3 g/dL (ref 30.0–36.0)
MCV: 81.3 fl (ref 78.0–100.0)
Monocytes Absolute: 0.5 10*3/uL (ref 0.1–1.0)
Monocytes Relative: 16.1 % — ABNORMAL HIGH (ref 3.0–12.0)
Neutro Abs: 1.9 10*3/uL (ref 1.4–7.7)
Neutrophils Relative %: 58.4 % (ref 43.0–77.0)
Platelets: 185 10*3/uL (ref 150.0–400.0)
RBC: 3.66 Mil/uL — ABNORMAL LOW (ref 3.87–5.11)
RDW: 15.8 % — ABNORMAL HIGH (ref 11.5–15.5)
WBC: 3.3 10*3/uL — ABNORMAL LOW (ref 4.0–10.5)

## 2022-05-26 LAB — HEMOGLOBIN A1C: Hgb A1c MFr Bld: 5.9 % (ref 4.6–6.5)

## 2022-05-26 LAB — COMPREHENSIVE METABOLIC PANEL
ALT: 7 U/L (ref 0–35)
AST: 15 U/L (ref 0–37)
Albumin: 3.8 g/dL (ref 3.5–5.2)
Alkaline Phosphatase: 68 U/L (ref 39–117)
BUN: 27 mg/dL — ABNORMAL HIGH (ref 6–23)
CO2: 26 mEq/L (ref 19–32)
Calcium: 8.7 mg/dL (ref 8.4–10.5)
Chloride: 104 mEq/L (ref 96–112)
Creatinine, Ser: 1.53 mg/dL — ABNORMAL HIGH (ref 0.40–1.20)
GFR: 28.97 mL/min — ABNORMAL LOW (ref >60.00)
Glucose, Bld: 108 mg/dL — ABNORMAL HIGH (ref 70–99)
Potassium: 3.8 mEq/L (ref 3.5–5.1)
Sodium: 136 mEq/L (ref 135–145)
Total Bilirubin: 0.3 mg/dL (ref 0.2–1.2)
Total Protein: 6.5 g/dL (ref 6.0–8.3)

## 2022-05-26 LAB — LIPID PANEL
Cholesterol: 188 mg/dL (ref 0–200)
HDL: 65.1 mg/dL (ref >39.00)
LDL Cholesterol: 113 mg/dL — ABNORMAL HIGH (ref 0–99)
NonHDL: 122.67
Total CHOL/HDL Ratio: 3
Triglycerides: 48 mg/dL (ref 0.0–149.0)
VLDL: 9.6 mg/dL (ref 0.0–40.0)

## 2022-05-26 LAB — VITAMIN D 25 HYDROXY (VIT D DEFICIENCY, FRACTURES): VITD: 25.32 ng/mL — ABNORMAL LOW (ref 30.00–100.00)

## 2022-05-26 LAB — TSH: TSH: 3.47 u[IU]/mL (ref 0.35–5.50)

## 2022-05-26 LAB — VITAMIN B12: Vitamin B-12: 813 pg/mL (ref 211–911)

## 2022-05-26 MED ORDER — METOPROLOL TARTRATE 25 MG PO TABS: 12.5000 mg | ORAL_TABLET | Freq: Every day | ORAL | 3 refills | Status: DC | PRN

## 2022-05-26 NOTE — Progress Notes (Signed)
Phone [phone]   Subjective:  Patient presents today for their annual physical. Chief complaint-noted.   See problem oriented charting- ROS- full  review of systems was completed and negative except for: fatigue, very occasional night sweats, had some weight loss but related to pain after fracture, fatigue related to pain, sneezing, joint pain, back pain   The following were reviewed and entered/updated in epic: Past Medical History:  Diagnosis Date   Arthritis    Chicken pox    Glaucoma of both eyes    High cholesterol    Hypertension    a. Normal renal arteries by duplex 2013.   Hypothyroidism    Migraine    PAD (peripheral artery disease)    a. 04/2013: s/p PCI to occluded right popliteal artery    PAF (paroxysmal atrial fibrillation)    a. s/p MAZE 2006. b. Event monitor 2013: NSR with PACs.   Sinus bradycardia    a. 03/2011 during hospital admission - beta blocker stopped.   Stroke    a. Pt reports 3-4 prior strokes without residual sx. b. CVA 2013 (presented with headache) - started on Plavix. Event monitor as outpt - no AF.   Patient Active Problem List   Diagnosis Date Noted   COVID-19 10/12/2019    Priority: High   CKD (chronic kidney disease), stage IV 06/15/2018    Priority: High   Chronic cough 08/12/2015    Priority: High   PAD (peripheral artery disease) ([place]) 05/10/2013    Priority: High   PAF (paroxysmal atrial fibrillation)     Priority: High   History of stroke     Priority: High   Carotid atherosclerosis 05/01/2021    Priority: Medium    Low vitamin B12 level 12/09/2017    Priority: Medium    Hyperglycemia 06/25/2014    Priority: Medium    Hypothyroidism 06/07/2014    Priority: Medium    Hyperlipidemia 06/07/2014    Priority: Medium    Hypertension     Priority: Medium    GERD (gastroesophageal reflux disease) 06/25/2014    Priority: Low   Vitamin D deficiency 06/25/2014    Priority: Low   Intracervical pessary 06/07/2014    Priority:  Low   Sinus bradycardia 05/19/2013    Priority: Low   PAC (premature atrial contraction) 05/20/2011    Priority: Low   Diarrhea 02/13/2021   Anemia of chronic disease 12/14/2019   Anemia 08/16/2017   Perennial allergic rhinitis 08/12/2015   Past Surgical History:  Procedure Laterality Date   CATARACT EXTRACTION Left 11/09/2017   LOWER EXTREMITY ANGIOGRAM N/A 05/15/2013   Procedure: LOWER EXTREMITY ANGIOGRAM;  Surgeon: Lorretta Harp, MD;  Location: [facility] CATH LAB;  Service: Cardiovascular;  Laterality: N/A;   MAZE  ~ 2006   POPLITEAL ARTERY STENT  05/15/2013    Family History  Problem Relation Age of Onset   Hypertension Mother    Ulcers Father    Cervical cancer Sister    Throat cancer Brother    Lung cancer Brother    Cancer Brother    Allergic rhinitis Neg Hx    Angioedema Neg Hx    Asthma Neg Hx    Atopy Neg Hx    Eczema Neg Hx    Immunodeficiency Neg Hx    Urticaria Neg Hx     Medications- reviewed and updated Current Outpatient Medications  Medication Sig Dispense Refill   apixaban (ELIQUIS) 2.5 MG TABS tablet TAKE 1 TABLET(2.5 MG) BY MOUTH TWICE DAILY 180  tablet 3   ARMOUR THYROID 15 MG tablet TAKE 1 TABLET BY MOUTH DAILY 90 tablet 1   chlorthalidone (HYGROTON) 25 MG tablet Take 1 tablet (25 mg total) by mouth daily. 90 tablet 3   Digestive Enzyme CAPS Take 1 capsule by mouth See admin instructions. Mix the contents of 1 capsule into water and drink with all meals     dorzolamide-timolol (COSOPT) 22.3-6.8 MG/ML ophthalmic solution Place 1 drop into the left eye in the morning and at bedtime.      doxazosin (CARDURA) 2 MG tablet TAKE ONE-HALF TABLET BY MOUTH AT BEDTIME 45 tablet 2   famotidine (PEPCID) 10 MG tablet Take 1 tablet (10 mg total) by mouth 2 (two) times daily. 60 tablet 5   hydrALAZINE (APRESOLINE) 25 MG tablet Take 25 mg by mouth 3 (three) times daily.     loperamide (IMODIUM A-D) 2 MG tablet Take 1 tablet (2 mg total) by mouth 4 (four) times daily as  needed for diarrhea or loose stools. 20 tablet 0   Multiple Vitamin (MULTIVITAMIN) LIQD Take 30 mLs by mouth daily.     Nutritional Supplements (FEEDING SUPPLEMENT, KATE FARMS STANDARD 1.4,) LIQD liquid Take 325 mLs by mouth 3 (three) times daily between meals.     Papaya TABS Take 3 tablets by mouth 3 (three) times daily.      Probiotic CAPS Take 1 capsule by mouth daily.     metoprolol tartrate (LOPRESSOR) 25 MG tablet Take 0.5 tablets (12.5 mg total) by mouth daily as needed (HR >110 take only BID). 45 tablet 3   No current facility-administered medications for this visit.    Allergies-reviewed and updated Allergies  Allergen Reactions   Aceon [Perindopril Erbumine] Cough   Amlodipine Other (See Comments)    Lower extremity discomfort   Aspirin Cough and Other (See Comments)    STOPPED BY A MEDICAL PROFESSIONAL   Azor [Amlodipine-Olmesartan] Cough   Beta Adrenergic Blockers Other (See Comments)    Bradycardia   Hydralazine Nausea And Vomiting and Other (See Comments)    Weakness- tolerating in 2021    Milk-Related Compounds Other (See Comments) and Cough    STOPPED BY A MEDICAL PROFESSIONAL   Other Other (See Comments) and Cough    NUTS- ALL TYPES- (STOPPED BY A MEDICAL PROFESSIONAL) NO FOODS THAT CAUSE GAS (will cause A-Fib)   Peanut-Containing Drug Products Other (See Comments) and Cough    STOPPED BY A MEDICAL PROFESSIONAL   Sulfamethoxazole-Trimethoprim Other (See Comments)    Patient cannot specifically recall the reaction   Vitamin D Analogs Cough    Social History   Social History Narrative   Widowed. Lost husband to COVID-19 in September 2020. Married 65 years in 2016.  22 years in 2016. 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 6 greatgrandkids.    Finished 10th grade.       Retired from working in [employer] for [employer]         Objective  Objective:  BP (!) 132/56   Pulse (!) 54   Temp (!) 97 F (36.1 C)   Ht 5'  3" (1.6 m)   Wt 121 lb (54.9 kg)   SpO2 98%   BMI 21.43 kg/m  Gen: NAD, resting comfortably, appears younger than stated age 89: Mucous membranes are moist. Oropharynx normal Neck: no thyromegaly CV: bradycardic but regular,  no murmurs rubs or gallops Lungs: CTAB no crackles, wheeze, rhonchi Abdomen: soft/nontender/nondistended/normal bowel sounds. No rebound or  guarding.  Ext: no edema Skin: warm, dry Neuro: grossly normal, moves all extremities, PERRLA   Assessment and Plan   89 y.o. female presenting for annual physical.  Health Maintenance counseling: 1. Anticipatory guidance: Patient counseled regarding regular dental exams -q6 months, eye exams - yearly or twice yearly- glaucoma monitoring,  avoiding smoking and second hand smoke , limiting alcohol to 1 beverage per day- none , no illicit drugs.   2. Risk factor reduction:  Advised patient of need for regular exercise and diet rich and fruits and vegetables to reduce risk of heart attack and stroke.  Exercise- limited by her hip right now- doing some physical therapy.  Diet/weight management-see above ROS about weight loss.  Wt Readings from Last 3 Encounters:  05/26/22 121 lb (54.9 kg)  03/30/22 141 lb (64 kg)  02/06/22 128 lb 12.8 oz (58.4 kg)   3. Immunizations/screenings/ancillary studies- wants to hold off on shingles or COVID shot Immunization History  Administered Date(s) Administered   Td 06/20/2019  4. Cervical cancer screening- past age based screening recommendations - sees gynecology 30th of this month - mild discharge and has pessary 5. Breast cancer screening-  past age based screening recommendations - she is considering updating with breast center- prefers to get back on yearly 6. Colon cancer screening - past age based screening recommendations - no melena or bright red blood per rectum  7. Skin cancer screening- lower risk due to melanin content. Denies worrisome, changing, or new skin lesions.  8.  Birth control/STD check- not sexually active  9. Osteoporosis screening at 28- osteopenia previously and discussed updating with recent fracture- she declines-options would be more limited with renal function 10. Smoking associated screening - never smoker  Status of chronic or acute concerns   #Femoral neck fracture- working with Dr. Sammuel Hines - no displacement. She is trying to avoid surgery. Q 2 week follow up planned -tripped after a chair broke in front of her at an event  # Atrial fibrillation with history of Maze procedure S: Rate controlled with metoprolol 12.5 mg up to BID if heart rate elevated over 110- has not needed but wants to update refill Anticoagulated with Eliquis 2.5 mg twice daily A/P: appropriately anticoagulated and treated with MAZE procedure- has metoprolol on hand if needed- continue current medicine   #hypertension #CKD lV  S: medication: chlorthalidone 25 mg, doxazosin 2.5 mg, hydralazine 25 mg 3 times a day,  -STOPPED irbesartan 300 mg 12/2021 concerned about as cause of cough- cough improved BP Readings from Last 3 Encounters:  05/26/22 (!) 132/56  03/30/22 (!) 120/46  02/06/22 138/64  A/P: stable- continue current medicines  #PAD #hyperlipidemia S: Medication:none - did not tolerate statins -denies claudication Lab Results  Component Value Date   CHOL 193 01/14/2021   HDL 64.40 01/14/2021   LDLCALC 101 (H) 01/14/2021   TRIG 140.0 01/14/2021   CHOLHDL 3 01/14/2021   A/P: did not tolerate multiple statin attempts in the past- so unlikely to start new medication  - peripheral arterial disease asymptomatic - leg pain related to hip  #hypothyroidism S: compliant On thyroid medication- armour thyroid 15 mg Lab Results  Component Value Date   TSH 4.43 11/04/2021  A/P: hopefully stable- update tsh today. Continue current meds for now   #Vitamin D deficiency S: Medication: not taking- gets cough with medicine Last vitamin D Lab Results  Component  Value Date   VD25OH 29.98 (L) 01/14/2021  A/P: update D- may need higher dose supplement-  but has had cough in past on daily treatment   # B12 deficiency S: Current treatment/medication (oral vs. IM): not taking  Lab Results  Component Value Date   VITAMINB12 3,831 (H) 10/16/2019  A/P: hopefully stable- update b12 today. Continue withoutmeds for now   Recommended follow up: Return in about 4 months (around 09/25/2022) for followup or sooner if needed.Schedule b4 you leave. Future Appointments  Date Time Provider [place]  05/27/2022  2:45 PM Vanetta Mulders, MD [facility]  06/10/2022 10:00 AM Vanetta Mulders, MD [facility]   Lab/Order associations:NOT fasting   ICD-10-CM   1. Preventative health care  Z00.00     2. Hyperglycemia  R73.9 HgB A1c    3. Screening for diabetes mellitus  Z13.1     4. Hyperlipidemia, unspecified hyperlipidemia type  E78.5 CBC with Differential/Platelet    Comprehensive metabolic panel    Lipid panel    5. Hypothyroidism, unspecified type  E03.9 TSH    6. Low vitamin B12 level  R79.89 Vitamin B12    7. Vitamin D deficiency  E55.9 Vitamin D (25 hydroxy)    8. CKD (chronic kidney disease), stage IV Chronic N18.4       Meds ordered this encounter  Medications   metoprolol tartrate (LOPRESSOR) 25 MG tablet    Sig: Take 0.5 tablets (12.5 mg total) by mouth daily as needed (HR >110 take only BID).    Dispense:  45 tablet    Refill:  3    Return precautions advised.  Garret Reddish, MD

## 2022-05-26 NOTE — Patient Instructions (Addendum)
[place] Schedule an appointment by calling [phone]. We can put in referral if needed  You are eligible to schedule your annual wellness visit with our nurse specialist Otila Kluver.  Please consider scheduling this before you leave today  Please stop by lab before you go If you have mychart- we will send your results within 3 business days of [place] receiving them.  If you do not have mychart- we will call you about results within 5 business days of [place] receiving them.  *please also note that you will see labs on mychart as soon as they post. I will later go in and write notes on them- will say "notes from Dr. Yong Channel"   Recommended follow up: Return in about 4 months (around 09/25/2022) for followup or sooner if needed.Schedule b4 you leave.

## 2022-05-27 ENCOUNTER — Ambulatory Visit (INDEPENDENT_AMBULATORY_CARE_PROVIDER_SITE_OTHER): Payer: Medicare Other | Admitting: Orthopaedic Surgery

## 2022-05-27 DIAGNOSIS — M25552 Pain in left hip: Secondary | ICD-10-CM | POA: Diagnosis not present

## 2022-05-27 NOTE — Progress Notes (Signed)
Chief Complaint: Left hip pain     History of Present Illness:   05/27/2022: Presents today for follow-up of her left hip.  She is now walking without any assistive devices.  She does not have any pain in the left hip.  She is here for routine follow-up   [name] is a 89 y.o. female presents today after a fall 3 weeks prior at a [employer].  She did need help getting up at that time.  She subsequently has been able to reestablish the ability to walk with a walker.  She is very independent and lives with her son.  They live in a single-story home.  She is here today for further assessment.    Surgical History:   none  PMH/PSH/Family History/Social History/Meds/Allergies:    Past Medical History:  Diagnosis Date   Arthritis    Chicken pox    Glaucoma of both eyes    High cholesterol    Hypertension    a. Normal renal arteries by duplex 2013.   Hypothyroidism    Migraine    PAD (peripheral artery disease)    a. 04/2013: s/p PCI to occluded right popliteal artery    PAF (paroxysmal atrial fibrillation)    a. s/p MAZE 2006. b. Event monitor 2013: NSR with PACs.   Sinus bradycardia    a. 03/2011 during hospital admission - beta blocker stopped.   Stroke    a. Pt reports 3-4 prior strokes without residual sx. b. CVA 2013 (presented with headache) - started on Plavix. Event monitor as outpt - no AF.   Past Surgical History:  Procedure Laterality Date   CATARACT EXTRACTION Left 11/09/2017   LOWER EXTREMITY ANGIOGRAM N/A 05/15/2013   Procedure: LOWER EXTREMITY ANGIOGRAM;  Surgeon: Lorretta Harp, MD;  Location: [facility] CATH LAB;  Service: Cardiovascular;  Laterality: N/A;   MAZE  ~ 2006   POPLITEAL ARTERY STENT  05/15/2013   Social History   Socioeconomic History   Marital status: Widowed    Spouse name: Not on file   Number of children: Not on file   Years of education: Not on file   Highest education level: Not on file   Occupational History   Not on file  Tobacco Use   Smoking status: Never   Smokeless tobacco: Never  Vaping Use   Vaping Use: Never used  Substance and Sexual Activity   Alcohol use: No    Alcohol/week: 0.0 standard drinks of alcohol   Drug use: No   Sexual activity: Never  Other Topics Concern   Not on file  Social History Narrative   Widowed. Lost husband to COVID-19 in September 2020. Married 65 years in 2016.  22 years in 2016. 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 6 greatgrandkids.    Finished 10th grade.       Retired from working in [employer] for [employer]         Social Determinants of [place] Strain: [place]  (05/10/2020)   Overall Financial Resource Strain (CARDIA)    Difficulty of Paying Living Expenses: Not hard at all  Food Insecurity: No Food Insecurity (05/10/2020)   Hunger Vital Sign    Worried About Running Out of Food in the Last Year: Never true  Ran Out of Food in the Last Year: Never true  Transportation Needs: No Transportation Needs (05/10/2020)   PRAPARE - Hydrologist (Medical): No    Lack of Transportation (Non-Medical): No  Physical Activity: Not on file  Stress: Stress Concern Present (05/10/2020)   [place]    Feeling of Stress : To some extent  Social Connections: Not on file   Family History  Problem Relation Age of Onset   Hypertension Mother    Ulcers Father    Cervical cancer Sister    Throat cancer Brother    Lung cancer Brother    Cancer Brother    Allergic rhinitis Neg Hx    Angioedema Neg Hx    Asthma Neg Hx    Atopy Neg Hx    Eczema Neg Hx    Immunodeficiency Neg Hx    Urticaria Neg Hx    Allergies  Allergen Reactions   Aceon [Perindopril Erbumine] Cough   Amlodipine Other (See Comments)    Lower extremity discomfort   Aspirin Cough and Other (See Comments)     STOPPED BY A MEDICAL PROFESSIONAL   Azor [Amlodipine-Olmesartan] Cough   Beta Adrenergic Blockers Other (See Comments)    Bradycardia   Hydralazine Nausea And Vomiting and Other (See Comments)    Weakness- tolerating in 2021    Milk-Related Compounds Other (See Comments) and Cough    STOPPED BY A MEDICAL PROFESSIONAL   Other Other (See Comments) and Cough    NUTS- ALL TYPES- (STOPPED BY A MEDICAL PROFESSIONAL) NO FOODS THAT CAUSE GAS (will cause A-Fib)   Peanut-Containing Drug Products Other (See Comments) and Cough    STOPPED BY A MEDICAL PROFESSIONAL   Sulfamethoxazole-Trimethoprim Other (See Comments)    Patient cannot specifically recall the reaction   Vitamin D Analogs Cough   Current Outpatient Medications  Medication Sig Dispense Refill   apixaban (ELIQUIS) 2.5 MG TABS tablet TAKE 1 TABLET(2.5 MG) BY MOUTH TWICE DAILY 180 tablet 3   ARMOUR THYROID 15 MG tablet TAKE 1 TABLET BY MOUTH DAILY 90 tablet 1   chlorthalidone (HYGROTON) 25 MG tablet Take 1 tablet (25 mg total) by mouth daily. 90 tablet 3   Digestive Enzyme CAPS Take 1 capsule by mouth See admin instructions. Mix the contents of 1 capsule into water and drink with all meals     dorzolamide-timolol (COSOPT) 22.3-6.8 MG/ML ophthalmic solution Place 1 drop into the left eye in the morning and at bedtime.      doxazosin (CARDURA) 2 MG tablet TAKE ONE-HALF TABLET BY MOUTH AT BEDTIME 45 tablet 2   famotidine (PEPCID) 10 MG tablet Take 1 tablet (10 mg total) by mouth 2 (two) times daily. 60 tablet 5   hydrALAZINE (APRESOLINE) 25 MG tablet Take 25 mg by mouth 3 (three) times daily.     loperamide (IMODIUM A-D) 2 MG tablet Take 1 tablet (2 mg total) by mouth 4 (four) times daily as needed for diarrhea or loose stools. 20 tablet 0   metoprolol tartrate (LOPRESSOR) 25 MG tablet Take 0.5 tablets (12.5 mg total) by mouth daily as needed (HR >110 take only BID). 45 tablet 3   Multiple Vitamin (MULTIVITAMIN) LIQD Take 30 mLs by mouth  daily.     Nutritional Supplements (FEEDING SUPPLEMENT, KATE FARMS STANDARD 1.4,) LIQD liquid Take 325 mLs by mouth 3 (three) times daily between meals.     Papaya TABS Take 3 tablets by  mouth 3 (three) times daily.      Probiotic CAPS Take 1 capsule by mouth daily.     No current facility-administered medications for this visit.   No results found.  Review of Systems:   A ROS was performed including pertinent positives and negatives as documented in the HPI.  Physical Exam :   Constitutional: NAD and appears stated age Neurological: Alert and oriented Psych: Appropriate affect and cooperative There were no vitals taken for this visit.   Comprehensive Musculoskeletal Exam:    There is tenderness with logroll about the left hip 30 degrees internal/external rotation of the hip.  She is weak and active flexion without remainder of distal neurosensory exam is intact.  Limb lengths are equal walks with a walker  Imaging:   Xray (4 views left hip): Valgus impacted femoral neck fracture   I personally reviewed and interpreted the radiographs.   Assessment:   89 y.o. female with an impacted femoral neck fracture.  X-rays today show consistent fracture healing without any interval displacement.  At this time I will plan for final follow-up in 8 weeks for reassessment with final x-rays Plan :    -Return to clinic in 2 months for reassessment     I personally saw and evaluated the patient, and participated in the management and treatment plan.  Vanetta Mulders, MD Attending Physician, Orthopedic Surgery  This document was dictated using Dragon voice recognition software. A reasonable attempt at proof reading has been made to minimize errors.

## 2022-06-04 DIAGNOSIS — H401132 Primary open-angle glaucoma, bilateral, moderate stage: Secondary | ICD-10-CM | POA: Diagnosis not present

## 2022-06-10 ENCOUNTER — Ambulatory Visit (HOSPITAL_BASED_OUTPATIENT_CLINIC_OR_DEPARTMENT_OTHER): Payer: Medicare Other | Admitting: Orthopaedic Surgery

## 2022-06-23 DIAGNOSIS — N816 Rectocele: Secondary | ICD-10-CM | POA: Diagnosis not present

## 2022-06-23 DIAGNOSIS — N8111 Cystocele, midline: Secondary | ICD-10-CM | POA: Diagnosis not present

## 2022-06-24 ENCOUNTER — Telehealth: Payer: Self-pay | Admitting: Family Medicine

## 2022-06-24 MED ORDER — APIXABAN 2.5 MG PO TABS: ORAL_TABLET | ORAL | 3 refills | Status: DC

## 2022-06-24 NOTE — Telephone Encounter (Signed)
Prescription Request  06/24/2022  LOV: 05/26/2022  What is the name of the medication or equipment?  apixaban (ELIQUIS) 2.5 MG TABS tablet   Have you contacted your pharmacy to request a refill? No   Which pharmacy would you like this sent to?   Pt states she received the meds through our office with some paperwork that were done. Please advise.   Patient notified that their request is being sent to the clinical staff for review and that they should receive a response within 2 business days.   Please advise at Mobile [phone] (mobile)

## 2022-06-24 NOTE — Telephone Encounter (Signed)
Refill sent to pharmacy.   

## 2022-07-02 DIAGNOSIS — H401132 Primary open-angle glaucoma, bilateral, moderate stage: Secondary | ICD-10-CM | POA: Diagnosis not present

## 2022-07-29 ENCOUNTER — Ambulatory Visit (INDEPENDENT_AMBULATORY_CARE_PROVIDER_SITE_OTHER): Payer: Medicare Other

## 2022-07-29 ENCOUNTER — Ambulatory Visit (INDEPENDENT_AMBULATORY_CARE_PROVIDER_SITE_OTHER): Payer: Medicare Other | Admitting: Orthopaedic Surgery

## 2022-07-29 DIAGNOSIS — M25552 Pain in left hip: Secondary | ICD-10-CM | POA: Diagnosis not present

## 2022-07-29 DIAGNOSIS — S72002A Fracture of unspecified part of neck of left femur, initial encounter for closed fracture: Secondary | ICD-10-CM | POA: Diagnosis not present

## 2022-07-29 NOTE — Progress Notes (Signed)
Chief Complaint: Left hip pain     History of Present Illness:   07/29/2022: Today for follow-up of her femoral neck fracture.  Overall she is doing extremely well with only mild pain.   [name] is a 89 y.o. female presents today after a fall 3 weeks prior at a [employer].  She did need help getting up at that time.  She subsequently has been able to reestablish the ability to walk with a walker.  She is very independent and lives with her son.  They live in a single-story home.  She is here today for further assessment.    Surgical History:   none  PMH/PSH/Family History/Social History/Meds/Allergies:    Past Medical History:  Diagnosis Date   Arthritis    Chicken pox    Glaucoma of both eyes    High cholesterol    Hypertension    a. Normal renal arteries by duplex 2013.   Hypothyroidism    Migraine    PAD (peripheral artery disease) (HCC)    a. 04/2013: s/p PCI to occluded right popliteal artery    PAF (paroxysmal atrial fibrillation) (HCC)    a. s/p MAZE 2006. b. Event monitor 2013: NSR with PACs.   Sinus bradycardia    a. 03/2011 during hospital admission - beta blocker stopped.   Stroke [facility])    a. Pt reports 3-4 prior strokes without residual sx. b. CVA 2013 (presented with headache) - started on Plavix. Event monitor as outpt - no AF.   Past Surgical History:  Procedure Laterality Date   CATARACT EXTRACTION Left 11/09/2017   LOWER EXTREMITY ANGIOGRAM N/A 05/15/2013   Procedure: LOWER EXTREMITY ANGIOGRAM;  Surgeon: Runell Gess, MD;  Location: [facility] CATH LAB;  Service: Cardiovascular;  Laterality: N/A;   MAZE  ~ 2006   POPLITEAL ARTERY STENT  05/15/2013   Social History   Socioeconomic History   Marital status: Widowed    Spouse name: Not on file   Number of children: Not on file   Years of education: Not on file   Highest education level: Not on file  Occupational History   Not on file  Tobacco Use   Smoking  status: Never   Smokeless tobacco: Never  Vaping Use   Vaping Use: Never used  Substance and Sexual Activity   Alcohol use: No    Alcohol/week: 0.0 standard drinks of alcohol   Drug use: No   Sexual activity: Never  Other Topics Concern   Not on file  Social History Narrative   Widowed. Lost husband to COVID-19 in September 2020. Married 65 years in 2016. [place] 22 years in 2016. 4 children-3 boys, youngest girl [place] in [place]. 7 grandkids. 6 greatgrandkids.    Finished 10th grade.       Retired from working in [employer] for [employer]         Social Determinants of Health   Financial Resource Strain: Low Risk  (05/10/2020)   Overall Financial Resource Strain (CARDIA)    Difficulty of Paying Living Expenses: Not hard at all  Food Insecurity: No Food Insecurity (05/10/2020)   Hunger Vital Sign    Worried About Running Out of Food in the Last Year: Never true    Ran Out of Food in the Last Year: Never true  Transportation Needs: No Transportation Needs (05/10/2020)   PRAPARE - Administrator, Civil Service (Medical): No    Lack of Transportation (Non-Medical): No  Physical Activity: Not on file  Stress: Stress Concern Present (05/10/2020)   [employer] of Occupational Health - Occupational Stress Questionnaire    Feeling of Stress : To some extent  Social Connections: Not on file   Family History  Problem Relation Age of Onset   Hypertension Mother    Ulcers Father    Cervical cancer Sister    Throat cancer Brother    Lung cancer Brother    Cancer Brother    Allergic rhinitis Neg Hx    Angioedema Neg Hx    Asthma Neg Hx    Atopy Neg Hx    Eczema Neg Hx    Immunodeficiency Neg Hx    Urticaria Neg Hx    Allergies  Allergen Reactions   Aceon [Perindopril Erbumine] Cough   Amlodipine Other (See Comments)    Lower extremity discomfort   Aspirin Cough and Other (See Comments)    STOPPED BY A MEDICAL PROFESSIONAL   Azor  [Amlodipine-Olmesartan] Cough   Beta Adrenergic Blockers Other (See Comments)    Bradycardia   Hydralazine Nausea And Vomiting and Other (See Comments)    Weakness- tolerating in 2021    Milk-Related Compounds Other (See Comments) and Cough    STOPPED BY A MEDICAL PROFESSIONAL   Other Other (See Comments) and Cough    NUTS- ALL TYPES- (STOPPED BY A MEDICAL PROFESSIONAL) NO FOODS THAT CAUSE GAS (will cause A-Fib)   Peanut-Containing Drug Products Other (See Comments) and Cough    STOPPED BY A MEDICAL PROFESSIONAL   Sulfamethoxazole-Trimethoprim Other (See Comments)    Patient cannot specifically recall the reaction   Vitamin D Analogs Cough   Current Outpatient Medications  Medication Sig Dispense Refill   apixaban (ELIQUIS) 2.5 MG TABS tablet TAKE 1 TABLET(2.5 MG) BY MOUTH TWICE DAILY 180 tablet 3   ARMOUR THYROID 15 MG tablet TAKE 1 TABLET BY MOUTH DAILY 90 tablet 1   chlorthalidone (HYGROTON) 25 MG tablet Take 1 tablet (25 mg total) by mouth daily. 90 tablet 3   Digestive Enzyme CAPS Take 1 capsule by mouth See admin instructions. Mix the contents of 1 capsule into water and drink with all meals     dorzolamide-timolol (COSOPT) 22.3-6.8 MG/ML ophthalmic solution Place 1 drop into the left eye in the morning and at bedtime.      doxazosin (CARDURA) 2 MG tablet TAKE ONE-HALF TABLET BY MOUTH AT BEDTIME 45 tablet 2   famotidine (PEPCID) 10 MG tablet Take 1 tablet (10 mg total) by mouth 2 (two) times daily. 60 tablet 5   hydrALAZINE (APRESOLINE) 25 MG tablet Take 25 mg by mouth 3 (three) times daily.     loperamide (IMODIUM A-D) 2 MG tablet Take 1 tablet (2 mg total) by mouth 4 (four) times daily as needed for diarrhea or loose stools. 20 tablet 0   metoprolol tartrate (LOPRESSOR) 25 MG tablet Take 0.5 tablets (12.5 mg total) by mouth daily as needed (HR >110 take only BID). 45 tablet 3   Multiple Vitamin (MULTIVITAMIN) LIQD Take 30 mLs by mouth daily.     Nutritional Supplements (FEEDING  SUPPLEMENT, KATE FARMS STANDARD 1.4,) LIQD liquid Take 325 mLs by mouth 3 (three) times daily between meals.     Papaya TABS Take 3 tablets by mouth 3 (three) times daily.      Probiotic  CAPS Take 1 capsule by mouth daily.     No current facility-administered medications for this visit.   No results found.  Review of Systems:   A ROS was performed including pertinent positives and negatives as documented in the HPI.  Physical Exam :   Constitutional: NAD and appears stated age Neurological: Alert and oriented Psych: Appropriate affect and cooperative There were no vitals taken for this visit.   Comprehensive Musculoskeletal Exam:    There is tenderness with logroll about the left hip 30 degrees internal/external rotation of the hip.  She is weak and active flexion without remainder of distal neurosensory exam is intact.  Limb lengths are equal walks with a walker  Imaging:   Xray (4 views left hip): Valgus impacted femoral neck fracture   I personally reviewed and interpreted the radiographs.   Assessment:   89 y.o. female with an impacted femoral neck fracture.  X-rays today show consistent fracture healing without any interval displacement.  At this time I will plan to see her back as needed Plan :    -Return to clinic as needed     I personally saw and evaluated the patient, and participated in the management and treatment plan.  Huel Cote, MD Attending Physician, Orthopedic Surgery  This document was dictated using Dragon voice recognition software. A reasonable attempt at proof reading has been made to minimize errors.

## 2022-10-01 ENCOUNTER — Ambulatory Visit: Payer: Medicare Other | Admitting: Family Medicine

## 2022-10-08 ENCOUNTER — Ambulatory Visit: Payer: Medicare Other | Admitting: Cardiovascular Disease

## 2022-10-09 ENCOUNTER — Other Ambulatory Visit: Payer: Self-pay | Admitting: Family Medicine

## 2022-10-13 DIAGNOSIS — N189 Chronic kidney disease, unspecified: Secondary | ICD-10-CM | POA: Diagnosis not present

## 2022-10-13 DIAGNOSIS — I480 Paroxysmal atrial fibrillation: Secondary | ICD-10-CM | POA: Diagnosis not present

## 2022-10-13 DIAGNOSIS — N184 Chronic kidney disease, stage 4 (severe): Secondary | ICD-10-CM | POA: Diagnosis not present

## 2022-10-13 DIAGNOSIS — D631 Anemia in chronic kidney disease: Secondary | ICD-10-CM | POA: Diagnosis not present

## 2022-10-13 DIAGNOSIS — I129 Hypertensive chronic kidney disease with stage 1 through stage 4 chronic kidney disease, or unspecified chronic kidney disease: Secondary | ICD-10-CM | POA: Diagnosis not present

## 2022-10-13 DIAGNOSIS — N2581 Secondary hyperparathyroidism of renal origin: Secondary | ICD-10-CM | POA: Diagnosis not present

## 2022-10-13 LAB — LAB REPORT - SCANNED: EGFR: 34

## 2022-10-14 ENCOUNTER — Encounter: Payer: Self-pay | Admitting: Nephrology

## 2022-10-14 ENCOUNTER — Encounter: Payer: Self-pay | Admitting: Family Medicine

## 2022-10-14 ENCOUNTER — Ambulatory Visit: Payer: Medicare Other | Admitting: Family Medicine

## 2022-10-14 VITALS — BP 138/50 | HR 49 | Temp 97.9°F | Ht 63.0 in | Wt 121.4 lb

## 2022-10-14 DIAGNOSIS — I10 Essential (primary) hypertension: Secondary | ICD-10-CM | POA: Diagnosis not present

## 2022-10-14 DIAGNOSIS — I480 Paroxysmal atrial fibrillation: Secondary | ICD-10-CM

## 2022-10-14 DIAGNOSIS — N184 Chronic kidney disease, stage 4 (severe): Secondary | ICD-10-CM | POA: Diagnosis not present

## 2022-10-14 DIAGNOSIS — E039 Hypothyroidism, unspecified: Secondary | ICD-10-CM

## 2022-10-14 LAB — LAB REPORT - SCANNED
Albumin, Urine POC: 44.7
Creatinine, POC: 34.4 mg/dL
Microalb Creat Ratio: 130

## 2022-10-14 MED ORDER — CHLORTHALIDONE 25 MG PO TABS: 25.0000 mg | ORAL_TABLET | Freq: Every day | ORAL | 3 refills | Status: DC

## 2022-10-14 NOTE — Progress Notes (Signed)
Phone [phone] In person visit   Subjective:   [name] is a 89 y.o. year old very pleasant female patient who presents for/with See problem oriented charting Chief Complaint  Patient presents with   Medical Management of Chronic Issues    States had labs done with kidney doc yesterday and they are supposed to be faxing them over (have not received yet)   Hyperlipidemia   Hypertension   Hypothyroidism   Past Medical History-  Patient Active Problem List   Diagnosis Date Noted   COVID-19 10/12/2019    Priority: High   CKD (chronic kidney disease), stage IV (HCC) 06/15/2018    Priority: High   Chronic cough 08/12/2015    Priority: High   PAD (peripheral artery disease) (HCC) 05/10/2013    Priority: High   PAF (paroxysmal atrial fibrillation) (HCC)     Priority: High   History of stroke     Priority: High   Carotid atherosclerosis 05/01/2021    Priority: Medium    Low vitamin B12 level 12/09/2017    Priority: Medium    Hyperglycemia 06/25/2014    Priority: Medium    Hypothyroidism 06/07/2014    Priority: Medium    Hyperlipidemia 06/07/2014    Priority: Medium    Hypertension     Priority: Medium    GERD (gastroesophageal reflux disease) 06/25/2014    Priority: Low   Vitamin D deficiency 06/25/2014    Priority: Low   Intracervical pessary 06/07/2014    Priority: Low   Sinus bradycardia 05/19/2013    Priority: Low   PAC (premature atrial contraction) 05/20/2011    Priority: Low   Diarrhea 02/13/2021   Anemia of chronic disease 12/14/2019   Anemia 08/16/2017   Perennial allergic rhinitis 08/12/2015    Medications- reviewed and updated Current Outpatient Medications  Medication Sig Dispense Refill   apixaban (ELIQUIS) 2.5 MG TABS tablet TAKE 1 TABLET(2.5 MG) BY MOUTH TWICE DAILY 180 tablet 3   ARMOUR THYROID 15 MG tablet TAKE 1 TABLET BY MOUTH EVERY DAY 90 tablet 1   chlorthalidone (HYGROTON) 25 MG tablet Take 1 tablet (25 mg total) by mouth daily.  90 tablet 3   Digestive Enzyme CAPS Take 1 capsule by mouth See admin instructions. Mix the contents of 1 capsule into water and drink with all meals     dorzolamide-timolol (COSOPT) 22.3-6.8 MG/ML ophthalmic solution Place 1 drop into the left eye in the morning and at bedtime.      doxazosin (CARDURA) 2 MG tablet TAKE ONE-HALF TABLET BY MOUTH AT BEDTIME 45 tablet 2   famotidine (PEPCID) 10 MG tablet Take 1 tablet (10 mg total) by mouth 2 (two) times daily. 60 tablet 5   hydrALAZINE (APRESOLINE) 25 MG tablet Take 25 mg by mouth 3 (three) times daily.     loperamide (IMODIUM A-D) 2 MG tablet Take 1 tablet (2 mg total) by mouth 4 (four) times daily as needed for diarrhea or loose stools. 20 tablet 0   metoprolol tartrate (LOPRESSOR) 25 MG tablet Take 0.5 tablets (12.5 mg total) by mouth daily as needed (HR >110 take only BID). 45 tablet 3   Multiple Vitamin (MULTIVITAMIN) LIQD Take 30 mLs by mouth daily.     Nutritional Supplements (FEEDING SUPPLEMENT, KATE FARMS STANDARD 1.4,) LIQD liquid Take 325 mLs by mouth 3 (three) times daily between meals.     Papaya TABS Take 3 tablets by mouth 3 (three) times daily.      Probiotic CAPS Take 1  capsule by mouth daily.     No current facility-administered medications for this visit.     Objective:  BP (!) 138/50   Pulse (!) 49   Temp 97.9 F (36.6 C)   Ht 5\' 3"  (1.6 m)   Wt 121 lb 6.4 oz (55.1 kg)   SpO2 100%   BMI 21.51 kg/m  Gen: NAD, resting comfortably CV:  bradycardic but regular today no murmurs rubs or gallops Lungs: CTAB no crackles, wheeze, rhonchi Ext: trace edema Skin: warm, dry    Assessment and Plan   # Atrial fibrillation with history of Maze procedure S: Rate controlled with metoprolol 12.5 mg up to BID if heart rate elevated over 110- no use in over a year Anticoagulated with Eliquis 2.5 mg twice daily A/P: appropriately anticoagulated and rate controlled- continue current medicine   #hypertenhsion #CKD lV - sees Dr.  Marisue Humble S: medication: chlorthalidone 25 mg, doxazosin 2 mg, hydralazine 25 mg 3 times a day- 1.5 in morning and 1.5 in the evening mainly,  -STOPPED irbesartan 300 mg 12/2021 concerned about as cause of cough -no recent issues with cough but comes and goes BP Readings from Last 3 Encounters:  10/14/22 (!) 138/50  05/26/22 (!) 132/56  03/30/22 (!) 120/46  A/P: hypertension - stable- continue current medicines - I do not think we should increase dose with how low diastolic runs - chronic kidney disease IV- actually in stage III range yesterday with [place] [facility]- continue to monitor   #hypothyroidism S: compliant On thyroid medication- armour thyroid 15 mg Lab Results  Component Value Date   TSH 3.47 05/26/2022  A/P:stable- continue current medicines    #anemia and leukopenia- anemia of chronic disease per oncology 12/16/19 with only Primary Care Provider (PCP) follow up  -hemoglobin was up to 10.2 yesterday with [place] [facility]. Feels cold.   Recommended follow up: Return in about 4 months (around 02/13/2023) for followup or sooner if needed.Schedule b4 you leave.  Lab/Order associations:   ICD-10-CM   1. PAF (paroxysmal atrial fibrillation) (HCC)  I48.0     2. CKD (chronic kidney disease), stage IV (HCC)  N18.4     3. Primary hypertension  I10     4. Hypothyroidism, unspecified type  E03.9       Meds ordered this encounter  Medications   chlorthalidone (HYGROTON) 25 MG tablet    Sig: Take 1 tablet (25 mg total) by mouth daily.    Dispense:  90 tablet    Refill:  3    Requesting 1 year supply    Return precautions advised.  Tana Conch, MD

## 2022-10-14 NOTE — Patient Instructions (Addendum)
Glad you are doing so well. Glad blood pressure is hanging in there! And your kidneys looked slightly better  Recommended follow up: Return in about 4 months (around 02/13/2023) for followup or sooner if needed.Schedule b4 you leave.

## 2022-10-30 DIAGNOSIS — H401132 Primary open-angle glaucoma, bilateral, moderate stage: Secondary | ICD-10-CM | POA: Diagnosis not present

## 2022-11-09 ENCOUNTER — Other Ambulatory Visit (HOSPITAL_BASED_OUTPATIENT_CLINIC_OR_DEPARTMENT_OTHER): Payer: Self-pay | Admitting: Cardiovascular Disease

## 2022-11-09 DIAGNOSIS — I739 Peripheral vascular disease, unspecified: Secondary | ICD-10-CM

## 2022-11-10 ENCOUNTER — Other Ambulatory Visit (HOSPITAL_BASED_OUTPATIENT_CLINIC_OR_DEPARTMENT_OTHER): Payer: Self-pay | Admitting: Cardiovascular Disease

## 2022-11-13 ENCOUNTER — Ambulatory Visit (HOSPITAL_COMMUNITY)
Admission: RE | Admit: 2022-11-13 | Discharge: 2022-11-13 | Disposition: A | Discharge status: Home / Self Care | Payer: Medicare Other | Source: Ambulatory Visit | Attending: Cardiovascular Disease | Admitting: Cardiovascular Disease

## 2022-11-13 ENCOUNTER — Ambulatory Visit (HOSPITAL_BASED_OUTPATIENT_CLINIC_OR_DEPARTMENT_OTHER)
Admission: RE | Admit: 2022-11-13 | Discharge: 2022-11-13 | Disposition: A | Discharge status: Home / Self Care | Payer: Medicare Other | Source: Ambulatory Visit | Attending: Cardiovascular Disease | Admitting: Cardiovascular Disease

## 2022-11-13 DIAGNOSIS — I739 Peripheral vascular disease, unspecified: Secondary | ICD-10-CM

## 2022-11-13 LAB — VAS US ABI WITH/WO TBI
Left ABI: 1.16
Right ABI: 1.1

## 2022-12-28 DIAGNOSIS — N8111 Cystocele, midline: Secondary | ICD-10-CM | POA: Diagnosis not present

## 2022-12-28 DIAGNOSIS — N816 Rectocele: Secondary | ICD-10-CM | POA: Diagnosis not present

## 2023-01-06 ENCOUNTER — Other Ambulatory Visit: Payer: Self-pay | Admitting: Family Medicine

## 2023-02-02 NOTE — Progress Notes (Shared)
[name] is a 89 y.o. female here for a new problem.  History of Present Illness:   No chief complaint on file.   HPI  Upper Respiratory Infection Reports experiencing head congestion since    Past Medical History:  Diagnosis Date   Arthritis    Chicken pox    Glaucoma of both eyes    High cholesterol    Hypertension    a. Normal renal arteries by duplex 2013.   Hypothyroidism    Migraine    PAD (peripheral artery disease) (HCC)    a. 04/2013: s/p PCI to occluded right popliteal artery    PAF (paroxysmal atrial fibrillation) (HCC)    a. s/p MAZE 2006. b. Event monitor 2013: NSR with PACs.   Sinus bradycardia    a. 03/2011 during hospital admission - beta blocker stopped.   Stroke [facility])    a. Pt reports 3-4 prior strokes without residual sx. b. CVA 2013 (presented with headache) - started on Plavix. Event monitor as outpt - no AF.     Social History   Tobacco Use   Smoking status: Never   Smokeless tobacco: Never  Vaping Use   Vaping status: Never Used  Substance Use Topics   Alcohol use: No    Alcohol/week: 0.0 standard drinks of alcohol   Drug use: No    Past Surgical History:  Procedure Laterality Date   CATARACT EXTRACTION Left 11/09/2017   LOWER EXTREMITY ANGIOGRAM N/A 05/15/2013   Procedure: LOWER EXTREMITY ANGIOGRAM;  Surgeon: Runell Gess, MD;  Location: [facility] CATH LAB;  Service: Cardiovascular;  Laterality: N/A;   MAZE  ~ 2006   POPLITEAL ARTERY STENT  05/15/2013    Family History  Problem Relation Age of Onset   Hypertension Mother    Ulcers Father    Cervical cancer Sister    Throat cancer Brother    Lung cancer Brother    Cancer Brother    Allergic rhinitis Neg Hx    Angioedema Neg Hx    Asthma Neg Hx    Atopy Neg Hx    Eczema Neg Hx    Immunodeficiency Neg Hx    Urticaria Neg Hx     Allergies  Allergen Reactions   Aceon [Perindopril Erbumine] Cough   Amlodipine Other (See Comments)    Lower extremity discomfort   Aspirin  Cough and Other (See Comments)    STOPPED BY A MEDICAL PROFESSIONAL   Azor [Amlodipine-Olmesartan] Cough   Beta Adrenergic Blockers Other (See Comments)    Bradycardia   Hydralazine Nausea And Vomiting and Other (See Comments)    Weakness- tolerating in 2021    Milk-Related Compounds Other (See Comments) and Cough    STOPPED BY A MEDICAL PROFESSIONAL   Other Other (See Comments) and Cough    NUTS- ALL TYPES- (STOPPED BY A MEDICAL PROFESSIONAL) NO FOODS THAT CAUSE GAS (will cause A-Fib)   Peanut-Containing Drug Products Other (See Comments) and Cough    STOPPED BY A MEDICAL PROFESSIONAL   Sulfamethoxazole-Trimethoprim Other (See Comments)    Patient cannot specifically recall the reaction   Vitamin D Analogs Cough    Current Medications:   Current Outpatient Medications:    apixaban (ELIQUIS) 2.5 MG TABS tablet, TAKE 1 TABLET(2.5 MG) BY MOUTH TWICE DAILY, Disp: 180 tablet, Rfl: 3   ARMOUR THYROID 15 MG tablet, TAKE 1 TABLET BY MOUTH EVERY DAY, Disp: 90 tablet, Rfl: 1   chlorthalidone (HYGROTON) 25 MG tablet, Take 1 tablet (25 mg total)  by mouth daily., Disp: 90 tablet, Rfl: 3   Digestive Enzyme CAPS, Take 1 capsule by mouth See admin instructions. Mix the contents of 1 capsule into water and drink with all meals, Disp: , Rfl:    dorzolamide-timolol (COSOPT) 22.3-6.8 MG/ML ophthalmic solution, Place 1 drop into the left eye in the morning and at bedtime. , Disp: , Rfl:    doxazosin (CARDURA) 2 MG tablet, TAKE ONE-HALF TABLET BY MOUTH AT BEDTIME, Disp: 45 tablet, Rfl: 1   famotidine (PEPCID) 10 MG tablet, Take 1 tablet (10 mg total) by mouth 2 (two) times daily., Disp: 60 tablet, Rfl: 5   hydrALAZINE (APRESOLINE) 25 MG tablet, TAKE 1 AND 1/2 TABLETS(37.5 MG) BY MOUTH THREE TIMES DAILY, Disp: 150 tablet, Rfl: 3   loperamide (IMODIUM A-D) 2 MG tablet, Take 1 tablet (2 mg total) by mouth 4 (four) times daily as needed for diarrhea or loose stools., Disp: 20 tablet, Rfl: 0   metoprolol  tartrate (LOPRESSOR) 25 MG tablet, Take 0.5 tablets (12.5 mg total) by mouth daily as needed (HR >110 take only BID)., Disp: 45 tablet, Rfl: 3   Multiple Vitamin (MULTIVITAMIN) LIQD, Take 30 mLs by mouth daily., Disp: , Rfl:    Nutritional Supplements (FEEDING SUPPLEMENT, KATE FARMS STANDARD 1.4,) LIQD liquid, Take 325 mLs by mouth 3 (three) times daily between meals., Disp: , Rfl:    Papaya TABS, Take 3 tablets by mouth 3 (three) times daily. , Disp: , Rfl:    Probiotic CAPS, Take 1 capsule by mouth daily., Disp: , Rfl:    Review of Systems:   ROS  Vitals:   There were no vitals filed for this visit.   There is no height or weight on file to calculate BMI.  Physical Exam:   Physical Exam  Assessment and Plan:   ***   I,Alexander Ruley,acting as a scribe for Jarold Motto, PA.,have documented all relevant documentation on the behalf of Jarold Motto, PA,as directed by  Jarold Motto, PA while in the presence of Jarold Motto, [place].   ***  Jarold Motto, PA-C

## 2023-02-03 ENCOUNTER — Telehealth: Payer: Self-pay | Admitting: Family Medicine

## 2023-02-03 ENCOUNTER — Ambulatory Visit: Payer: Medicare Other | Admitting: Physician Assistant

## 2023-02-03 NOTE — Telephone Encounter (Signed)
Patient states she was late to appointment today and was not able to be seen. States she is unable to come back to office because she lives far away, not feeling well.  Requests to be called to discuss.  Requests an antibiotic be sent to:  [id number] [place], [place] - [address] AT [facility] [address] & [facility] [address] Phone: [phone]  Fax: [phone]

## 2023-02-03 NOTE — Telephone Encounter (Signed)
Spoke with pt, pt stated that she had OV today with Dr Durene Cal and was a few mins late and was unable to be seen. Pt states that she is unable to come back to this area due to the distance. Pt c/o of a head cold, I advised pt that she would need to be seen if requesting antibiotics and if she was closer to a Urgent Care by her house she should be seen there, otherwise she would need to schedule office here. Pt expressed clear understanding.

## 2023-02-09 ENCOUNTER — Encounter (HOSPITAL_COMMUNITY): Payer: Self-pay

## 2023-02-09 ENCOUNTER — Ambulatory Visit (HOSPITAL_COMMUNITY)
Admission: RE | Admit: 2023-02-09 | Discharge: 2023-02-09 | Disposition: A | Discharge status: Home / Self Care | Payer: Medicare Other | Source: Ambulatory Visit | Attending: Physician Assistant | Admitting: Physician Assistant

## 2023-02-09 ENCOUNTER — Ambulatory Visit (HOSPITAL_COMMUNITY): Payer: Medicare Other

## 2023-02-09 VITALS — BP 154/66 | HR 59 | Temp 98.5°F | Resp 16 | Ht 63.0 in | Wt 120.0 lb

## 2023-02-09 DIAGNOSIS — J069 Acute upper respiratory infection, unspecified: Secondary | ICD-10-CM | POA: Diagnosis not present

## 2023-02-09 MED ORDER — DOXYCYCLINE HYCLATE 100 MG PO CAPS
100.0000 mg | ORAL_CAPSULE | Freq: Two times a day (BID) | ORAL | 0 refills | Status: DC
Start: 2023-02-09 — End: 2023-04-02

## 2023-02-09 NOTE — ED Provider Notes (Signed)
[facility]    CSN: [id number] Arrival date & time: 02/09/23  1025      History   Chief Complaint Chief Complaint  Patient presents with   Cough    HPI [name] is a 89 y.o. female.   Patient here concerned with nasal congestion x9 days . Thick, yellow mucus.  Admits PND, cough, which she says is productive in AM due to PND.  Denies f/c, n/v/d, wheezing, SOB.  No sick contacts.    Past Medical History:  Diagnosis Date   Arthritis    Chicken pox    Glaucoma of both eyes    High cholesterol    Hypertension    a. Normal renal arteries by duplex 2013.   Hypothyroidism    Migraine    PAD (peripheral artery disease) (HCC)    a. 04/2013: s/p PCI to occluded right popliteal artery    PAF (paroxysmal atrial fibrillation) (HCC)    a. s/p MAZE 2006. b. Event monitor 2013: NSR with PACs.   Sinus bradycardia    a. 03/2011 during hospital admission - beta blocker stopped.   Stroke [facility])    a. Pt reports 3-4 prior strokes without residual sx. b. CVA 2013 (presented with headache) - started on Plavix. Event monitor as outpt - no AF.    Patient Active Problem List   Diagnosis Date Noted   Carotid atherosclerosis 05/01/2021   Diarrhea 02/13/2021   Anemia of chronic disease 12/14/2019   COVID-19 10/12/2019   CKD (chronic kidney disease), stage IV (HCC) 06/15/2018   Low vitamin B12 level 12/09/2017   Anemia 08/16/2017   Chronic cough 08/12/2015   Perennial allergic rhinitis 08/12/2015   GERD (gastroesophageal reflux disease) 06/25/2014   Hyperglycemia 06/25/2014   Vitamin D deficiency 06/25/2014   Hypothyroidism 06/07/2014   Hyperlipidemia 06/07/2014   Intracervical pessary 06/07/2014   Sinus bradycardia 05/19/2013   PAD (peripheral artery disease) (HCC) 05/10/2013   PAC (premature atrial contraction) 05/20/2011   PAF (paroxysmal atrial fibrillation) (HCC)    History of stroke    Hypertension     Past Surgical History:  Procedure Laterality Date    CATARACT EXTRACTION Left 11/09/2017   LOWER EXTREMITY ANGIOGRAM N/A 05/15/2013   Procedure: LOWER EXTREMITY ANGIOGRAM;  Surgeon: Runell Gess, MD;  Location: [facility] CATH LAB;  Service: Cardiovascular;  Laterality: N/A;   MAZE  ~ 2006   POPLITEAL ARTERY STENT  05/15/2013    OB History   No obstetric history on file.      Home Medications    Prior to Admission medications   Medication Sig Start Date End Date Taking? Authorizing Provider  doxycycline (VIBRAMYCIN) 100 MG capsule Take 1 capsule (100 mg total) by mouth 2 (two) times daily. 02/09/23  Yes Evern Core, PA-C  apixaban (ELIQUIS) 2.5 MG TABS tablet TAKE 1 TABLET(2.5 MG) BY MOUTH TWICE DAILY 06/24/22   Shelva Majestic, MD  ARMOUR THYROID 15 MG tablet TAKE 1 TABLET BY MOUTH EVERY DAY 10/09/22   Shelva Majestic, MD  chlorthalidone (HYGROTON) 25 MG tablet Take 1 tablet (25 mg total) by mouth daily. 10/14/22   Shelva Majestic, MD  Digestive Enzyme CAPS Take 1 capsule by mouth See admin instructions. Mix the contents of 1 capsule into water and drink with all meals    [provider]  dorzolamide-timolol (COSOPT) 22.3-6.8 MG/ML ophthalmic solution Place 1 drop into the left eye in the morning and at bedtime.     [provider]  doxazosin (CARDURA) 2 MG tablet TAKE ONE-HALF TABLET BY MOUTH AT BEDTIME 11/10/22   Kathleene Hazel, MD  famotidine (PEPCID) 10 MG tablet Take 1 tablet (10 mg total) by mouth 2 (two) times daily. 08/05/21   Shelva Majestic, MD  hydrALAZINE (APRESOLINE) 25 MG tablet TAKE 1 AND 1/2 TABLETS(37.5 MG) BY MOUTH THREE TIMES DAILY 01/07/23   Shelva Majestic, MD  loperamide (IMODIUM A-D) 2 MG tablet Take 1 tablet (2 mg total) by mouth 4 (four) times daily as needed for diarrhea or loose stools. 02/13/21   Dulce Sellar, NP  metoprolol tartrate (LOPRESSOR) 25 MG tablet Take 0.5 tablets (12.5 mg total) by mouth daily as needed (HR >110 take only BID). 05/26/22 05/21/23  Shelva Majestic, MD   Multiple Vitamin (MULTIVITAMIN) LIQD Take 30 mLs by mouth daily.    [provider]  Nutritional Supplements (FEEDING SUPPLEMENT, KATE FARMS STANDARD 1.4,) LIQD liquid Take 325 mLs by mouth 3 (three) times daily between meals. 10/18/19   Elgergawy, Leana Roe, MD  Papaya TABS Take 3 tablets by mouth 3 (three) times daily.     [provider]  Probiotic CAPS Take 1 capsule by mouth daily.    [provider]    Family History Family History  Problem Relation Age of Onset   Hypertension Mother    Ulcers Father    Cervical cancer Sister    Throat cancer Brother    Lung cancer Brother    Cancer Brother    Allergic rhinitis Neg Hx    Angioedema Neg Hx    Asthma Neg Hx    Atopy Neg Hx    Eczema Neg Hx    Immunodeficiency Neg Hx    Urticaria Neg Hx     Social History Social History   Tobacco Use   Smoking status: Never   Smokeless tobacco: Never  Vaping Use   Vaping status: Never Used  Substance Use Topics   Alcohol use: No    Alcohol/week: 0.0 standard drinks of alcohol   Drug use: No     Allergies   Aceon [perindopril erbumine], Amlodipine, Aspirin, Azor [amlodipine-olmesartan], Beta adrenergic blockers, Hydralazine, Milk-related compounds, Other, Peanut-containing drug products, Sulfamethoxazole-trimethoprim, and Vitamin d analogs   Review of Systems Review of Systems  Constitutional:  Negative for chills, fatigue and fever.  HENT:  Positive for congestion, postnasal drip and rhinorrhea. Negative for ear pain, nosebleeds, sinus pressure, sinus pain and sore throat.   Eyes:  Negative for pain and redness.  Respiratory:  Positive for cough. Negative for shortness of breath and wheezing.   Gastrointestinal:  Negative for abdominal pain, diarrhea, nausea and vomiting.  Musculoskeletal:  Negative for arthralgias and myalgias.  Skin:  Negative for rash.  Neurological:  Negative for light-headedness and headaches.  Hematological:  Negative for  adenopathy. Does not bruise/bleed easily.  Psychiatric/Behavioral:  Negative for confusion and sleep disturbance.      Physical Exam Triage Vital Signs ED Triage Vitals  Encounter Vitals Group     BP 02/09/23 1049 (!) 154/66     Systolic BP Percentile --      Diastolic BP Percentile --      Pulse Rate 02/09/23 1049 (!) 59     Resp 02/09/23 1049 16     Temp 02/09/23 1049 98.5 F (36.9 C)     Temp Source 02/09/23 1049 Oral     SpO2 02/09/23 1049 97 %     Weight 02/09/23 1048 120 lb (54.4 kg)  Height 02/09/23 1048 5\' 3"  (1.6 m)     Head Circumference --      Peak Flow --      Pain Score 02/09/23 1046 0     Pain Loc --      Pain Education --      Exclude from Growth Chart --    No data found.  Updated Vital Signs BP (!) 154/66 (BP Location: Left Arm)   Pulse (!) 59   Temp 98.5 F (36.9 C) (Oral)   Resp 16   Ht 5\' 3"  (1.6 m)   Wt 120 lb (54.4 kg)   SpO2 97%   BMI 21.26 kg/m   Visual Acuity Right Eye Distance:   Left Eye Distance:   Bilateral Distance:    Right Eye Near:   Left Eye Near:    Bilateral Near:     Physical Exam Vitals and nursing note reviewed.  Constitutional:      General: She is not in acute distress.    Appearance: Normal appearance. She is not ill-appearing.  HENT:     Head: Normocephalic and atraumatic.     Right Ear: Tympanic membrane and ear canal normal.     Left Ear: Tympanic membrane and ear canal normal.     Nose: No mucosal edema, congestion or rhinorrhea.     Right Sinus: No maxillary sinus tenderness or frontal sinus tenderness.     Left Sinus: No frontal sinus tenderness.     Comments: Injected nasal passages    Mouth/Throat:     Pharynx: Oropharynx is clear. Uvula midline. Postnasal drip present. No oropharyngeal exudate or posterior oropharyngeal erythema.  Eyes:     General: No scleral icterus.    Extraocular Movements: Extraocular movements intact.     Conjunctiva/sclera: Conjunctivae normal.  Cardiovascular:     Rate  and Rhythm: Normal rate and regular rhythm.     Heart sounds: No murmur heard. Pulmonary:     Effort: Pulmonary effort is normal. No respiratory distress.     Breath sounds: Normal breath sounds. No wheezing or rales.  Musculoskeletal:     Cervical back: Normal range of motion. No rigidity.  Lymphadenopathy:     Cervical: No cervical adenopathy.  Skin:    Coloration: Skin is not jaundiced.     Findings: No rash.  Neurological:     General: No focal deficit present.     Mental Status: She is alert and oriented to person, place, and time.     Motor: No weakness.     Gait: Gait normal.  Psychiatric:        Mood and Affect: Mood normal.        Behavior: Behavior normal.      UC Treatments / Results  Labs (all labs ordered are listed, but only abnormal results are displayed) Labs Reviewed - No data to display  EKG   Radiology No results found.  Procedures Procedures (including critical care time)  Medications Ordered in UC Medications - No data to display  Initial Impression / Assessment and Plan / UC Course  I have reviewed the triage vital signs and the nursing notes.  Pertinent labs & imaging results that were available during my care of the patient were reviewed by me and considered in my medical decision making (see chart for details).     Follow up with PCP Go to ED or return with worsening symptoms Final Clinical Impressions(s) / UC Diagnoses   Final diagnoses:  Upper respiratory tract  infection, unspecified type     Discharge Instructions      Take medication if symptoms worsen or fail to improve Go to ED or follow up with PCP    ED Prescriptions     Medication Sig Dispense Auth. Provider   doxycycline (VIBRAMYCIN) 100 MG capsule Take 1 capsule (100 mg total) by mouth 2 (two) times daily. 14 capsule Evern Core, PA-C      PDMP not reviewed this encounter.   Evern Core, PA-C 02/09/23 1306

## 2023-02-09 NOTE — Discharge Instructions (Signed)
Take medication if symptoms worsen or fail to improve Go to ED or follow up with PCP

## 2023-02-09 NOTE — ED Triage Notes (Signed)
Patient here today with c/o productive cough and nasal congestion, X 9 days and night sweats X 2 nights. She has been taking a cough syrup and using a Nedi pot with some relief. No sick contacts.

## 2023-02-24 ENCOUNTER — Other Ambulatory Visit: Payer: Self-pay | Admitting: Family Medicine

## 2023-03-01 ENCOUNTER — Encounter: Payer: Self-pay | Admitting: Family Medicine

## 2023-03-01 ENCOUNTER — Ambulatory Visit (INDEPENDENT_AMBULATORY_CARE_PROVIDER_SITE_OTHER): Payer: Medicare Other | Admitting: Family Medicine

## 2023-03-01 VITALS — BP 165/79 | HR 55 | Temp 97.9°F | Ht 63.0 in | Wt 120.4 lb

## 2023-03-01 DIAGNOSIS — I480 Paroxysmal atrial fibrillation: Secondary | ICD-10-CM

## 2023-03-01 DIAGNOSIS — N184 Chronic kidney disease, stage 4 (severe): Secondary | ICD-10-CM

## 2023-03-01 DIAGNOSIS — R053 Chronic cough: Secondary | ICD-10-CM | POA: Diagnosis not present

## 2023-03-01 DIAGNOSIS — I10 Essential (primary) hypertension: Secondary | ICD-10-CM | POA: Diagnosis not present

## 2023-03-01 DIAGNOSIS — R079 Chest pain, unspecified: Secondary | ICD-10-CM | POA: Diagnosis not present

## 2023-03-01 NOTE — Patient Instructions (Addendum)
 We have placed a referral for you today to cardiology. In some cases you will see # listed below- you can call this if you have not heard within a week. If you do not see # listed- you should receive a mychart message or phone call within a week with the # to call directly- call that as soon as you get it. If you are having issues getting scheduled reach out to us  again.   Lets trial hydralazine  1 tablet twice a day for 7 days- monitor blood pressure- as long as doesn't increase past the 130s-150's range can further go down to half tablet twice daily and if stays in that range again can trial off completely- journal along with this how your pressure is doing. I do not know that this medicine or any of your medicines are the cause of cough but we will try to go one by one to see without letting your pressure go to high.   We considered Chest x-ray in office once our machine is up for convenience- we can always send you to [facility] for x-rays sooner if you change your mind  If you have new or worsening chest pain or other symptoms pleas sesek care but EKG largely stable  Recommended follow up: Return in about 1 month (around 04/01/2023) for followup or sooner if needed.Schedule b4 you leave.

## 2023-03-01 NOTE — Progress Notes (Signed)
 Phone [phone] In person visit   Subjective:   [name] is a 89 y.o. year old very pleasant female patient who presents for/with See problem oriented charting Chief Complaint  Patient presents with   Hyperlipidemia   Hypertension    Pt here for f.u on BP, pt would like to discuss switching Hydralazine , pt requesting ELIQUIS  samples    Cough    Pt c/o of cough x1 month, constant during the day and at night, when pt lays down, c/o of left sided chest pain. Pt coughs up clear mucous. Pt went to Urgent Care 2 weeks ago, did not pick up antibiotics that were prescribed.    Past Medical History-  Patient Active Problem List   Diagnosis Date Noted   COVID-19 10/12/2019    Priority: High   CKD (chronic kidney disease), stage IV (HCC) 06/15/2018    Priority: High   Chronic cough 08/12/2015    Priority: High   PAD (peripheral artery disease) (HCC) 05/10/2013    Priority: High   PAF (paroxysmal atrial fibrillation) (HCC)     Priority: High   History of stroke     Priority: High   Carotid atherosclerosis 05/01/2021    Priority: Medium    Low vitamin B12 level 12/09/2017    Priority: Medium    Hyperglycemia 06/25/2014    Priority: Medium    Hypothyroidism 06/07/2014    Priority: Medium    Hyperlipidemia 06/07/2014    Priority: Medium    Hypertension     Priority: Medium    GERD (gastroesophageal reflux disease) 06/25/2014    Priority: Low   Vitamin D  deficiency 06/25/2014    Priority: Low   Intracervical pessary 06/07/2014    Priority: Low   Sinus bradycardia 05/19/2013    Priority: Low   PAC (premature atrial contraction) 05/20/2011    Priority: Low   Diarrhea 02/13/2021   Anemia of chronic disease 12/14/2019   Anemia 08/16/2017   Perennial allergic rhinitis 08/12/2015    Medications- reviewed and updated Current Outpatient Medications  Medication Sig Dispense Refill   ARMOUR THYROID  15 MG tablet TAKE 1 TABLET BY MOUTH EVERY DAY 90 tablet 1    chlorthalidone  (HYGROTON ) 25 MG tablet Take 1 tablet (25 mg total) by mouth daily. 90 tablet 3   Digestive Enzyme CAPS Take 1 capsule by mouth See admin instructions. Mix the contents of 1 capsule into water and drink with all meals     dorzolamide-timolol (COSOPT) 22.3-6.8 MG/ML ophthalmic solution Place 1 drop into the left eye in the morning and at bedtime.      doxazosin  (CARDURA ) 2 MG tablet TAKE ONE-HALF TABLET BY MOUTH AT BEDTIME 45 tablet 1   ELIQUIS  2.5 MG TABS tablet TAKE 1 TABLET BY MOUTH TWICE  DAILY 60 tablet 11   famotidine  (PEPCID ) 10 MG tablet Take 1 tablet (10 mg total) by mouth 2 (two) times daily. 60 tablet 5   hydrALAZINE  (APRESOLINE ) 25 MG tablet TAKE 1 AND 1/2 TABLETS(37.5 MG) BY MOUTH THREE TIMES DAILY 150 tablet 3   loperamide  (IMODIUM  A-D) 2 MG tablet Take 1 tablet (2 mg total) by mouth 4 (four) times daily as needed for diarrhea or loose stools. 20 tablet 0   metoprolol  tartrate (LOPRESSOR ) 25 MG tablet Take 0.5 tablets (12.5 mg total) by mouth daily as needed (HR >110 take only BID). 45 tablet 3   Multiple Vitamin (MULTIVITAMIN) LIQD Take 30 mLs by mouth daily.     Nutritional Supplements (FEEDING SUPPLEMENT, KATE  FARMS STANDARD 1.4,) LIQD liquid Take 325 mLs by mouth 3 (three) times daily between meals.     Papaya TABS Take 3 tablets by mouth 3 (three) times daily.      Probiotic CAPS Take 1 capsule by mouth daily.     doxycycline  (VIBRAMYCIN ) 100 MG capsule Take 1 capsule (100 mg total) by mouth 2 (two) times daily. (Patient not taking: Reported on 03/01/2023) 14 capsule 0   No current facility-administered medications for this visit.     Objective:  BP (!) 165/79   Pulse (!) 55   Temp 97.9 F (36.6 C)   Ht 5' 3 (1.6 m)   Wt 120 lb 6.4 oz (54.6 kg)   SpO2 99%   BMI 21.33 kg/m  Gen: NAD, resting comfortably CV: RRR no murmurs rubs or gallops Lungs: CTAB no crackles, wheeze, rhonchi Ext: no edema Skin: warm, dry  EKG: sinus bradycardia with rate 44, normal  axis, normal  intervals, left ventricular hypertrophy, to wave inversion in I, II, avL, avf, v3, v4, v5, v6 with less than 1 box depression in multiple leads on sore throat segment. Appears unchanged from 03/30/22 EKG     Assessment and Plan   # Chronic cough # Left-sided chest pain S: Patient reports cough for about a month.  Constant during the day and night.  Occurs when patient lays down in particular. Coughs up clear mucus. As she did visit urgent care about 2 weeks ago-seen 02/09/2023-doxycycline  was sent in but she has not taken this yet-she already had cough for about 9 days at that point associated with nuclear mucus at that time -she reports had a head cold- that portion got better and now back to baseline lingering cough that she has had for years  She has noted some pressure in left side of chest when she lays down at night- also once laying down if lays on left side may also bother her, better if lays on right side. No pain in daytime. Wonders if gas related. Belching helps. Pain 7/10 but movement helps- usually within a few minutes can get pain gone if avoids laying on side. No exertional chest pain  A/P: Patient with ongoing chronic cough.  I added this to her problem list in 2017 on June 5-at that point she had 8 weeks of cough and she has had issues with cough since that time-we referred her to allergist and no clear solution was found.  They later referred her to ENT.  We attempted to treat allergies.  We have attempted to change her medications multiple times without relief.  On June 5 she was on irbesartan , chlorthalidone , hydralazine .  Of note she has stopped multiple medicines due to potential cough including aspirin, Azor, vitamin D  amongst other blood pressure medicines - Patient's primary concern is that her issues related to medication-I discussed today this is very challenging and we can certainly try to reduce her blood pressure medicines but there are certainly some risks  later.  She, her son, her daughter who called him all understand there are some potential risks including increasing risk of heart attack or stroke but quality of life is diminished by the cough and they want to try to reduce this. - See hypertension section all potential changes -offered chest x-ray, CT chest, or pulmonology consult-she declines at this time  For chest pain portion-EKG was largely reassuring compared to last check-discussed this does not rule out cardiac cause of her pain but if she has new  or worsening symptoms to let us  know, see cardiology or seek care immediately   # Atrial fibrillation with history of Maze procedure S: Rate controlled with metoprolol  12.5 mg up to BID if heart rate elevated over 110-she is not being this Anticoagulated with Eliquis  2.5 mg twice daily A/P: appropriately anticoagulated and rate controlled- continue current medicine   #hypertension #CKD lV - sees Dr. Marlee S: medication: chlorthalidone  25 mg, doxazosin  2 mg, hydralazine  37.5 mg 2 times a day- 1.5 in morning and 1.5 in the evening mainly -STOPPED irbesartan  300 mg 12/2021 concerned about as cause of cough  - from overview  notes  HCTZ 12.5mg -burning with urination Amlodipine -burning in shins and edema Beta blocker-cannot tolerate due to brady Hydralazine  50 TID- dizziness Clonidine  0.1 mg- bradycardia Chlorthalidone -cough Spironolactone can't use with renal function Irbesartan -cough  Home readings #s: 130s-150's at home. Diastolic controlled A/P: Blood pressure is certainly above goal for CKD stage IV as well as history of stroke and PAD in office and at home (but blood pressure better at home and patient would prefer to tolerate up to 150s though increases risk some)-family and patient are concerned about running blood pressure too low and also about side effects of medicine.  Patient request to trial off of hydralazine -see after visit summary about how we discussed weaning this  medication down.  For 6-week follow-up. - We could also trial off of chlorthalidone  which she was on at the time of the previously reported start of chronic cough in 2017 -Patient and family would like to see advanced hypertension clinic again-patient was referred back today  For CKD stage IV-fortunately most recent GFR was just above 30 with [place] [facility]-I do wonder in retrospect if perhaps we could try spironolactone but I would want Dr. Marlee involved in this decision or cardiology  #hypothyroidism S: compliant On thyroid  medication- armour thyroid  15 mg Lab Results  Component Value Date   TSH 3.47 05/26/2022  A/P: Has been well-controlled-she will need to hold off on repeat blood work today  Recommended follow up: Return in about 1 month (around 04/01/2023) for followup or sooner if needed.Schedule b4 you leave. Future Appointments  Date Time Provider Department Center  04/02/2023  2:20 PM Katrinka Garnette KIDD, MD [facility]  05/31/2023 11:00 AM Verlin Lonni BIRCH, MD [facility]   Lab/Order associations:   ICD-10-CM   1. Primary hypertension  I10 AMB REFERRAL TO ADVANCED HTN CLINIC    2. Left-sided chest pain  R07.9 EKG 12-Lead    3. Chronic cough  R05.3      No orders of the defined types were placed in this encounter.   Return precautions advised.  Garnette Katrinka, MD

## 2023-03-01 NOTE — Assessment & Plan Note (Signed)
#   Chronic cough # Left-sided chest pain S: Patient reports cough for about a month.  Constant during the day and night.  Occurs when patient lays down in particular.  Coughs up clear mucus. As she did visit urgent care about 2 weeks ago-seen 02/09/2023-doxycycline  was sent in but she has not taken this yet-she already had cough for about 9 days at that point associated with nuclear mucus at that time -she reports had a head cold- that portion got better and now back to baseline lingering cough that she has had for years  She has noted some pressure in left side of chest when she lays down at night- also once laying down if lays on left side may also bother her, better if lays on right side. No pain in daytime. Wonders if gas related. Belching helps. Pain 7/10 but movement helps- usually within a few minutes can get pain gone if avoids laying on side. No exertional chest pain  A/P: Patient with ongoing chronic cough.  I added this to her problem list in 2017 on June 5-at that point she had 8 weeks of cough and she has had issues with cough since that time-we referred her to allergist and no clear solution was found.  They later referred her to ENT.  We attempted to treat allergies.  We have attempted to change her medications multiple times without relief.  On June 5 she was on irbesartan , chlorthalidone , hydralazine .  Of note she has stopped multiple medicines due to potential cough including aspirin, Azor, vitamin D  amongst other blood pressure medicines - Patient's primary concern is that her issues related to medication-I discussed today this is very challenging and we can certainly try to reduce her blood pressure medicines but there are certainly some risks later.  She, her son, her daughter who called him all understand there are some potential risks including increasing risk of heart attack or stroke but quality of life is diminished by the cough and they want to try to reduce this. - See  hypertension section all potential changes -offered chest x-ray, CT chest, or pulmonology consult-she declines at this time  For chest pain portion-EKG was largely reassuring compared to last check-discussed this does not rule out cardiac cause of her pain but if she has new or worsening symptoms to let us  know, see cardiology or seek care immediately

## 2023-03-08 ENCOUNTER — Other Ambulatory Visit: Payer: Self-pay | Admitting: Family Medicine

## 2023-03-08 MED ORDER — APIXABAN 2.5 MG PO TABS: 2.5000 mg | ORAL_TABLET | Freq: Two times a day (BID) | ORAL | 11 refills | Status: DC

## 2023-03-08 NOTE — Telephone Encounter (Signed)
 Copied from CRM [id number]. Topic: Clinical - Medication Refill >> Mar 08, 2023  2:04 PM Viola FALCON wrote: Most Recent Primary Care Visit:  Provider: KATRINKA GARNETTE KIDD  Department: [facility]  Visit Type: OFFICE VISIT  Date: 03/01/2023  Medication: ELIQUIS  2.5 MG TABS tablet  Has the patient contacted their pharmacy? Yes (Agent: If no, request that the patient contact the pharmacy for the refill. If patient does not wish to contact the pharmacy document the reason why and proceed with request.) (Agent: If yes, when and what did the pharmacy advise?)  Is this the correct pharmacy for this prescription? Yes If no, delete pharmacy and type the correct one.  This is the patient's preferred pharmacy:    [id number] [address] [place], [place] - [address] AT [facility] [address] [facility] [address] [place] [address] Phone: [phone] Fax: [phone]   Has the prescription been filled recently? No  Is the patient out of the medication? Yes  Has the patient been seen for an appointment in the last year OR does the patient have an upcoming appointment? Yes  Can we respond through MyChart? Yes  Agent: Please be advised that Rx refills may take up to 3 business days. We ask that you follow-up with your pharmacy.

## 2023-03-09 NOTE — Telephone Encounter (Signed)
 Copied from CRM [id number]. Topic: Clinical - Prescription Issue >> Mar 09, 2023  3:41 PM [name] wrote: Reason for CRM: Patient called stated she is currently almost out of her ELIQUIS  2.5 MG TABS tablet. She said she called the pharmacy and they said they still do not have a refill request.

## 2023-03-10 ENCOUNTER — Other Ambulatory Visit: Payer: Self-pay | Admitting: Family Medicine

## 2023-03-10 NOTE — Telephone Encounter (Signed)
 Copied from CRM [id number]. Topic: Clinical - Prescription Issue >> Mar 10, 2023 12:35 PM Lizabeth Riggs wrote: Reason for CRM: [name] needs her ELIQUIS  refilled. She said she talked to a lady earlier and was told refill was sent in. Her pharmacy does not have refill. She wants to know when it will be refilled because she is out of med Please give her a call at [phone]

## 2023-03-25 ENCOUNTER — Other Ambulatory Visit: Payer: Self-pay | Admitting: Family Medicine

## 2023-03-30 DIAGNOSIS — H401132 Primary open-angle glaucoma, bilateral, moderate stage: Secondary | ICD-10-CM | POA: Diagnosis not present

## 2023-04-02 ENCOUNTER — Encounter: Payer: Self-pay | Admitting: Family Medicine

## 2023-04-02 ENCOUNTER — Ambulatory Visit (INDEPENDENT_AMBULATORY_CARE_PROVIDER_SITE_OTHER): Payer: Medicare Other | Admitting: Family Medicine

## 2023-04-02 VITALS — BP 150/56 | HR 52 | Temp 97.3°F | Ht 63.0 in | Wt 122.6 lb

## 2023-04-02 DIAGNOSIS — E039 Hypothyroidism, unspecified: Secondary | ICD-10-CM | POA: Diagnosis not present

## 2023-04-02 DIAGNOSIS — I480 Paroxysmal atrial fibrillation: Secondary | ICD-10-CM

## 2023-04-02 DIAGNOSIS — E559 Vitamin D deficiency, unspecified: Secondary | ICD-10-CM

## 2023-04-02 DIAGNOSIS — R7303 Prediabetes: Secondary | ICD-10-CM

## 2023-04-02 DIAGNOSIS — Z131 Encounter for screening for diabetes mellitus: Secondary | ICD-10-CM | POA: Diagnosis not present

## 2023-04-02 DIAGNOSIS — I10 Essential (primary) hypertension: Secondary | ICD-10-CM | POA: Diagnosis not present

## 2023-04-02 NOTE — Addendum Note (Signed)
 Addended by: Almira Jaeger on: 04/02/2023 09:48 PM   Modules accepted: Level of Service

## 2023-04-02 NOTE — Progress Notes (Signed)
 Phone [phone] In person visit   Subjective:   [name] is a 89 y.o. year old very pleasant female patient who presents for/with See problem oriented charting Chief Complaint  Patient presents with   Hypertension    Pt states she couldn't decrease pill dosage   Hyperlipidemia   Hypothyroidism   Past Medical History-  Patient Active Problem List   Diagnosis Date Noted   COVID-19 10/12/2019    Priority: High   CKD (chronic kidney disease), stage IV (HCC) 06/15/2018    Priority: High   Chronic cough 08/12/2015    Priority: High   PAD (peripheral artery disease) (HCC) 05/10/2013    Priority: High   PAF (paroxysmal atrial fibrillation) (HCC)     Priority: High   History of stroke     Priority: High   Carotid atherosclerosis 05/01/2021    Priority: Medium    Low vitamin B12 level 12/09/2017    Priority: Medium    Hyperglycemia 06/25/2014    Priority: Medium    Hypothyroidism 06/07/2014    Priority: Medium    Hyperlipidemia 06/07/2014    Priority: Medium    Hypertension     Priority: Medium    GERD (gastroesophageal reflux disease) 06/25/2014    Priority: Low   Vitamin D  deficiency 06/25/2014    Priority: Low   Intracervical pessary 06/07/2014    Priority: Low   Sinus bradycardia 05/19/2013    Priority: Low   PAC (premature atrial contraction) 05/20/2011    Priority: Low   Diarrhea 02/13/2021   Anemia of chronic disease 12/14/2019   Anemia 08/16/2017   Perennial allergic rhinitis 08/12/2015    Medications- reviewed and updated Current Outpatient Medications  Medication Sig Dispense Refill   apixaban  (ELIQUIS ) 2.5 MG TABS tablet TAKE 1 TABLET(2.5 MG) BY MOUTH TWICE DAILY 180 tablet 3   ARMOUR THYROID  15 MG tablet TAKE 1 TABLET BY MOUTH EVERY DAY 90 tablet 1   chlorthalidone  (HYGROTON ) 25 MG tablet Take 1 tablet (25 mg total) by mouth daily. 90 tablet 3   Digestive Enzyme CAPS Take 1 capsule by mouth See admin instructions. Mix the contents of 1  capsule into water and drink with all meals     dorzolamide-timolol (COSOPT) 22.3-6.8 MG/ML ophthalmic solution Place 1 drop into the left eye in the morning and at bedtime.      doxazosin  (CARDURA ) 2 MG tablet TAKE ONE-HALF TABLET BY MOUTH AT BEDTIME 45 tablet 1   famotidine  (PEPCID ) 10 MG tablet Take 1 tablet (10 mg total) by mouth 2 (two) times daily. 60 tablet 5   hydrALAZINE  (APRESOLINE ) 25 MG tablet TAKE 1 AND 1/2 TABLETS(37.5 MG) BY MOUTH THREE TIMES DAILY 150 tablet 3   loperamide  (IMODIUM  A-D) 2 MG tablet Take 1 tablet (2 mg total) by mouth 4 (four) times daily as needed for diarrhea or loose stools. 20 tablet 0   metoprolol  tartrate (LOPRESSOR ) 25 MG tablet Take 0.5 tablets (12.5 mg total) by mouth daily as needed (HR >110 take only BID). 45 tablet 3   Multiple Vitamin (MULTIVITAMIN) LIQD Take 30 mLs by mouth daily.     Nutritional Supplements (FEEDING SUPPLEMENT, KATE FARMS STANDARD 1.4,) LIQD liquid Take 325 mLs by mouth 3 (three) times daily between meals.     Papaya TABS Take 3 tablets by mouth 3 (three) times daily.      Probiotic CAPS Take 1 capsule by mouth daily.     No current facility-administered medications for this visit.  Objective:  BP (!) 150/56   Pulse (!) 52   Temp (!) 97.3 F (36.3 C)   Ht 5' 3 (1.6 m)   Wt 122 lb 9.6 oz (55.6 kg)   SpO2 97%   BMI 21.72 kg/m  Gen: NAD, resting comfortably.  Daughter with her today and she is in very good spirits CV: RRR no murmurs rubs or gallops Lungs: CTAB no crackles, wheeze, rhonchi Abdomen: soft/nontender/nondistended/normal bowel sounds. No rebound or guarding.  Ext: trace edema Skin: warm, dry     Assessment and Plan   #hypertension with suspected CKD stage IV but most recent GFR just over 30 S: medication: chlorthalidone  25 mg, doxazosin  2 mg, hydralazine  25 mg 2 times a day- 1.5 in morning and 1.5 in the evening mainly -cough has resolved in last 2 weeks - multiple prior trials on medications that have  eventually been stopped as patient attributes cough to them.   - from overview  notes  HCTZ 12.5mg -burning with urination Amlodipine -burning in shins and edema Beta blocker-cannot tolerate due to brady Hydralazine  50 TID- dizziness Clonidine  0.1 mg- bradycardia Chlorthalidone -cough Spironolactone can't use with renal function Irbesartan -cough  Home readings #s: usually around 150 at home with bottom #s in 60's mainly but sometimes up to 70.  - already tryng to do low salt diet,  BP Readings from Last 3 Encounters:  04/02/23 (!) 150/56  03/01/23 (!) 165/79  02/09/23 (!) 154/66  A/P: blood pressure is improved but still poorly controlled and cough is improved on same medicines that she previously had wanted to stop due to perception that cough is caused by them. I wanted to increase hydralazine  to 3x a day but her strong preference is to stay stable on current dose and get Dr. Skeeter  - of note she is on chlorthalidone  despite prior report of cough and currently without cough - gfr seems to have improved slightly- wonder about using spironolactone  # Atrial fibrillation with history of Maze procedure S: Rate controlled with metoprolol  12.5 mg up to BID if heart rate elevated over 110 but she tends to be bradycardic Anticoagulated with Eliquis  2.5 mg twice daily  A/P: appropriately anticoagulated and rate controlled- continue current medicine   #hypothyroidism S: compliant On thyroid  medication- armour thyroid  15 mg Lab Results  Component Value Date   TSH 3.47 05/26/2022  A/P:recommended checking TSH- has upcoming labs at nephrology and she wanted to try to do TSH there instead- printed labs- she agrees to do them here next visit if that's not possible   #Vitamin D  deficiency S: Medication: not on D at the moment Last vitamin D  Lab Results  Component Value Date   VD25OH 25.32 (L) 05/26/2022   A/P: likely low- reported cough on vitamin D  in past- seeing if nephrology will  draw and we may retrial   # Hyperglycemia/insulin resistance/prediabetes S:  Medication: none Lab Results  Component Value Date   HGBA1C 5.9 05/26/2022   HGBA1C 6.1 11/04/2021   HGBA1C 6.0 01/14/2021    A/P: prediabetes hopefully stable- wants to try to do a1c with nephrology but agrees to do here next visit if that's not possible   Recommended follow up: Return in about 4 months (around 07/31/2023) for followup or sooner if needed.Schedule b4 you leave. Future Appointments  Date Time Provider Department Center  04/07/2023 11:15 AM Raford Riggs, MD [facility]  05/31/2023 11:00 AM Verlin Lonni BIRCH, MD [facility]  08/03/2023 10:20 AM Katrinka Garnette KIDD, MD [facility]  Lab/Order associations:   ICD-10-CM   1. Primary hypertension  I10     2. Hypothyroidism, unspecified type  E03.9 TSH    3. Vitamin D  deficiency  E55.9 VITAMIN D  25 Hydroxy (Vit-D Deficiency, Fractures)    4. Prediabetes  R73.03 HgB A1c    5. Screening for diabetes mellitus  Z13.1 HgB A1c    6. PAF (paroxysmal atrial fibrillation) (HCC)  I48.0       No orders of the defined types were placed in this encounter.   Return precautions advised.  Garnette Lukes, MD

## 2023-04-02 NOTE — Patient Instructions (Addendum)
 Hopefully Dr. Raford can give us  some good guidance next week  Team please print labs- she is going to take them to [facility] to see if they will draw those in addition to her other labs  Glad blood pressure is slightly better and thrilled cough is better- fingers crossed it stays away for longer this time!    Recommended follow up: Return in about 4 months (around 07/31/2023) for followup or sooner if needed.Schedule b4 you leave.

## 2023-04-07 ENCOUNTER — Encounter (HOSPITAL_BASED_OUTPATIENT_CLINIC_OR_DEPARTMENT_OTHER): Payer: Medicaid Other | Admitting: Cardiovascular Disease

## 2023-05-03 ENCOUNTER — Other Ambulatory Visit (HOSPITAL_BASED_OUTPATIENT_CLINIC_OR_DEPARTMENT_OTHER): Payer: Self-pay | Admitting: Cardiovascular Disease

## 2023-05-05 ENCOUNTER — Telehealth: Payer: Self-pay | Admitting: Cardiovascular Disease

## 2023-05-05 MED ORDER — METOPROLOL TARTRATE 25 MG PO TABS: 12.5000 mg | ORAL_TABLET | Freq: Every day | ORAL | 0 refills | Status: AC | PRN

## 2023-05-05 NOTE — Telephone Encounter (Signed)
*  STAT* If patient is at the pharmacy, call can be transferred to refill team.   1. Which medications need to be refilled? (please list name of each medication and dose if known) metoprolol tartrate (LOPRESSOR) 25 MG tablet    2. Would you like to learn more about the convenience, safety, & potential cost savings by using the [facility]? ]No   3. Are you open to using the [facility] (Type [facility].) No   4. Which pharmacy/location (including street and city if local pharmacy) is medication to be sent to? [id number] [place] Otto, [place] - [address] AT [facility] [address] [facility] [address] Phone: [phone]  Fax: [phone]     5. Do they need a 30 day or 90 day supply? 90 day  Pt took last tablet today

## 2023-05-05 NOTE — Telephone Encounter (Signed)
 Pt's medication was sent to pt's pharmacy as requested. Confirmation received.

## 2023-05-10 ENCOUNTER — Ambulatory Visit (HOSPITAL_BASED_OUTPATIENT_CLINIC_OR_DEPARTMENT_OTHER): Payer: Medicaid Other | Admitting: Cardiovascular Disease

## 2023-05-10 VITALS — BP 171/61 | HR 45 | Ht 63.0 in | Wt 122.9 lb

## 2023-05-10 DIAGNOSIS — R001 Bradycardia, unspecified: Secondary | ICD-10-CM

## 2023-05-10 DIAGNOSIS — I10 Essential (primary) hypertension: Secondary | ICD-10-CM

## 2023-05-10 DIAGNOSIS — I480 Paroxysmal atrial fibrillation: Secondary | ICD-10-CM

## 2023-05-10 DIAGNOSIS — I739 Peripheral vascular disease, unspecified: Secondary | ICD-10-CM | POA: Diagnosis not present

## 2023-05-10 DIAGNOSIS — I6523 Occlusion and stenosis of bilateral carotid arteries: Secondary | ICD-10-CM

## 2023-05-10 DIAGNOSIS — E785 Hyperlipidemia, unspecified: Secondary | ICD-10-CM | POA: Diagnosis not present

## 2023-05-10 MED ORDER — BENZONATATE 100 MG PO CAPS: 100.0000 mg | ORAL_CAPSULE | Freq: Three times a day (TID) | ORAL | 0 refills | Status: DC | PRN

## 2023-05-10 MED ORDER — DOXAZOSIN MESYLATE 2 MG PO TABS: 2.0000 mg | ORAL_TABLET | Freq: Every day | ORAL | 1 refills | Status: DC

## 2023-05-10 MED ORDER — PANTOPRAZOLE SODIUM 40 MG PO TBEC: 40.0000 mg | DELAYED_RELEASE_TABLET | Freq: Every day | ORAL | 11 refills | Status: DC

## 2023-05-10 NOTE — Patient Instructions (Signed)
 Medication Instructions:  INCREASE YOUR DOXAZOSIN TO FULL TABLET DAILY   START PANTOPRAZOLE 40 MG DAILY   START TESSALON PERLES THREE TIMES A DAY AS NEEDED   Labwork: NONE  Testing/Procedures: NONE  Follow-Up: 6 TO 8 WEEKS WITH CAITLIN W NP OR DR [place]   If you need a refill on your cardiac medications before your next appointment, please call your pharmacy.

## 2023-05-10 NOTE — Progress Notes (Signed)
 Hypertension Clinic Follow Up:    Date:  05/10/2023   ID:  [name], DOB Oct 02, 1925, MRN [id number]  PCP:  Amanda Majestic, MD  Cardiologist:  Amanda Carrow, MD   Referring MD: Amanda Majestic, MD   CC: Hypertension  History of Present Illness:    [name] is a 89 y.o. female with a hx of paroxysmal atrial fibrillation, hypertension, hyperlipidemia, prior stroke, and CKD 3b here for follow up.  She first established care in the hypertension clinic 04/2020.  She was first diagnosed with hypertension in her 62s.  Initially it was more easy to control but has been more difficult lately.  When she checks her blood pressure at home it is mostly in the 120s to 170s.  On average it is in the 150s or higher.  She last saw [name] 02/2020.  At that time her blood pressure was 140/56, down 154/60 for the prior visit.  At the time she was taking irbesartan 300 mg, hydralazine 25 mg 3 times daily and chlorthalidone 25 mg daily.  She is metoprolol as needed for elevated heart rates and atrial fibrillation.  She last needed to use this in November.  She had some dizziness when hydralazine was increased to 50 mg.  Lately she has been under a lot of stress.  Her husband of 70 years passed away in 09/17/22from COVID-19.  They were both hospitalized at the same time.    [name] saw cardiology in the hospital in 2013 for bradycardia.  At that time her blood pressure was poorly controlled and she had recently been started on Bystolic but her heart rate dropped to the 30s and 40s.  It was recommended that Bystolic be discontinued.  She had renal artery Dopplers in 2013 that were normal.  At her initial visit her blood pressure was 198/78.  Renal Dopplers revealed normal blood flow bilaterally.  We recommended grief counseling and increasing her exercise.  Doxazosin was added to her regimen.  She followed up with [name] and most of her home readings were well-controlled.   She noted some diarrhea that she attributed to the doxazosin.  [name] has experienced a severe cough for the past three years, which she suspects may be related to her blood pressure medication. The cough is persistent, lasting for a month to a month and a half, then subsiding for a similar duration before returning. It is accompanied by the production of a 'slick white stuff,' which, if not expelled, leads to gagging and attempts to vomit. The cough often occurs after eating or spontaneously, significantly impacting her energy levels, leaving her feeling 'dragging' for the past two to three weeks.  She has a history of high blood pressure and is currently on several medications, including doxazosin, which she takes as a half tablet at night, and metoprolol, which she uses as needed, particularly after experiencing atrial fibrillation. She also takes hydralazine three times a day.  She experiences episodes of atrial fibrillation, which she associates with gas and cold intake. These episodes are managed with metoprolol, and she occasionally uses baking soda for gas relief. Her blood pressure readings at home vary, with systolic values sometimes reaching 170 and diastolic values typically low. Her heart rate is often low as well.  She reports a history of acid reflux, which she suspects may contribute to her cough, especially given her symptoms of gas and occasional burning in the chest. She does not recall  taking famotidine for reflux recently.  She has undergone evaluations by ear, nose, and throat specialists and allergy testing in the past, but no definitive cause for her cough has been identified. No sensation of food going down the wrong way or getting stuck when eating. She experiences watery eyes and nasal symptoms when coughing, but no significant sinus drainage or allergies have been identified.  She has reduced her physical activity due to the cough, although she previously engaged in  walking and cycling.      Previous antihypertensives: nebivolol-bradycardia HCTZ-dysuria Amlodipine- edema Hydralazine-dizziness Clonidine-bradycardia Chlorthalidone-cough Irbesartan-cough    Past Medical History:  Diagnosis Date   Arthritis    Chicken pox    Glaucoma of both eyes    High cholesterol    Hypertension    a. Normal renal arteries by duplex 2013.   Hypothyroidism    Migraine    PAD (peripheral artery disease) (HCC)    a. 04/2013: s/p PCI to occluded right popliteal artery    PAF (paroxysmal atrial fibrillation) (HCC)    a. s/p MAZE 2006. b. Event monitor 2013: NSR with PACs.   Sinus bradycardia    a. 03/2011 during hospital admission - beta blocker stopped.   Stroke [facility])    a. Pt reports 3-4 prior strokes without residual sx. b. CVA 2013 (presented with headache) - started on Plavix. Event monitor as outpt - no AF.    Past Surgical History:  Procedure Laterality Date   CATARACT EXTRACTION Left 11/09/2017   LOWER EXTREMITY ANGIOGRAM N/A 05/15/2013   Procedure: LOWER EXTREMITY ANGIOGRAM;  Surgeon: Amanda Gess, MD;  Location[facility] C[facility] LAB;  Service: Cardiovascular;  Laterality: N/A;   MAZE  ~ 2006   POPLITEAL ARTERY STENT  05/15/2013    Current Medications: No outpatient medications have been marked as taking for the 05/10/23 encounter (Appointment) with Amanda Si, MD.     Allergies:   Aceon [perindopril erbumine], Amlodipine, Aspirin, Azor [amlodipine-olmesartan], Beta adrenergic blockers, Hydralazine, Milk-related compounds, Other, Peanut-containing drug products, Sulfamethoxazole-trimethoprim, and Vitamin d analogs   Social History   Socioeconomic History   Marital status: Widowed    Spouse name: Not on file   Number of children: Not on file   Years of education: Not on file   Highest education level: Not on file  Occupational History   Not on file  Tobacco Use   Smoking status: Never   Smokeless tobacco: Never  Vaping Use   Vaping  status: Never Used  Substance and Sexual Activity   Alcohol use: No    Alcohol/week: 0.0 standard drinks of alcohol   Drug use: No   Sexual activity: Never  Other Topics Concern   Not on file  Social History Narrative   Widowed. Lost husband to COVID-19 in September 2020. Married 65 years in 2016. [place] 22 years in 2016. 4 children-3 boys, youngest girl [place] in [place]. 7 grandkids. 6 greatgrandkids.    Finished 10th grade.       Retired from working in [employer] for [employer]         Social Drivers of [employer]: Low Risk  (05/10/2020)   Overall Financial Resource Strain (CARDIA)    Difficulty of Paying Living Expenses: Not hard at all  Food Insecurity: No Food Insecurity (05/10/2020)   Hunger Vital Sign    Worried About Running Out of Food in the Last Year: Never true    Ran Out of Food in the Last Year:  Never true  Transportation Needs: No Transportation Needs (05/10/2020)   PRAPARE - Administrator, Civil Service (Medical): No    Lack of Transportation (Non-Medical): No  Physical Activity: Not on file  Stress: Stress Concern Present (05/10/2020)   [employer] of Occupational Health - Occupational Stress Questionnaire    Feeling of Stress : To some extent  Social Connections: Not on file     Family History: The patient's family history includes Cancer in her brother; Cervical cancer in her sister; Hypertension in her mother; Lung cancer in her brother; Throat cancer in her brother; Ulcers in her father. There is no history of Allergic rhinitis, Angioedema, Asthma, Atopy, Eczema, Immunodeficiency, or Urticaria.  ROS:   Please see the history of present illness.     All other systems reviewed and are negative.  EKGs/Labs/Other Studies Reviewed:    EKG:  EKG is not ordered today.    Recent Labs: 05/26/2022: ALT 7; BUN 27; Creatinine, Ser 1.53; Hemoglobin 9.6; Platelets 185.0; Potassium 3.8; Sodium 136; TSH 3.47   Recent  Lipid Panel    Component Value Date/Time   CHOL 188 05/26/2022 0956   TRIG 48.0 05/26/2022 0956   HDL 65.10 05/26/2022 0956   CHOLHDL 3 05/26/2022 0956   VLDL 9.6 05/26/2022 0956   LDLCALC 113 (H) 05/26/2022 0956   LDLCALC 133 (H) 12/06/2019 1500    Physical Exam:    VS:  BP (!) 171/61 (BP Location: Left Arm, Patient Position: Sitting, Cuff Size: Normal)   Pulse (!) 45   Ht 5\' 3"  (1.6 m)   Wt 122 lb 14.4 oz (55.7 kg)   SpO2 100%   BMI 21.77 kg/m  , BMI Body mass index is 21.77 kg/m. GENERAL:  Well appearing HEENT: Pupils equal round and reactive, fundi not visualized, oral mucosa unremarkable NECK:  No jugular venous distention, waveform within normal limits, carotid upstroke brisk and symmetric, no bruits, no thyromegaly LUNGS:  Clear to auscultation bilaterally HEART:  RRR.  PMI not displaced or sustained,S1 and S2 within normal limits, no S3, no S4, no clicks, no rubs, no murmurs ABD:  Flat, positive bowel sounds normal in frequency in pitch, no bruits, no rebound, no guarding, no midline pulsatile mass, no hepatomegaly, no splenomegaly EXT:  2 plus pulses throughout, trace edema, no cyanosis no clubbing SKIN:  No rashes no nodules NEURO:  Cranial nerves II through XII grossly intact, motor grossly intact throughout PSYCH:  Cognitively intact, oriented to person place and time  ASSESSMENT:    No diagnosis found.  PLAN:     # Chronic Cough Chronic cough likely due to acid reflux rather than medication. Prioritized reflux management to address esophageal irritation. - Prescribe Protonix (pantoprazole) for reflux. - Increase doxazosin to 2 mg at night. - Prescribe Tessalon Perles for cough relief as needed. - Re-evaluate in 6 weeks.  # GERD:  Suspected contributor to chronic cough and atrial fibrillation. Prioritized reflux management to address esophageal irritation. - Prescribe Protonix (pantoprazole) for reflux. - Advise dietary modifications to reduce  symptoms.  # Paroxysmal Atrial Fibrillation Episodes potentially linked to reflux and esophageal irritation. Managing reflux may reduce triggers. - Continue metoprolol as needed. -Continue Eliquis.  # Hypertension Long-standing hypertension with variable readings. Adjusting doxazosin to improve control. - Increase doxazosin to 2 mg at night. - Monitor blood pressure at home.  # Peripheral Edema Mild edema likely related to hypertension and medication. Monitoring planned with blood pressure management adjustments. - Monitor edema with blood  pressure management changes.      Dispo: Fu in 6-8 weeks  Medication Adjustments/Labs and Tests Ordered: Current medicines are reviewed at length with the patient today.  Concerns regarding medicines are outlined above.  No orders of the defined types were placed in this encounter.  No orders of the defined types were placed in this encounter.    Signed, Amanda Si, MD  05/10/2023 1:37 PM    [facility]

## 2023-05-24 DIAGNOSIS — E039 Hypothyroidism, unspecified: Secondary | ICD-10-CM | POA: Diagnosis not present

## 2023-05-24 DIAGNOSIS — N184 Chronic kidney disease, stage 4 (severe): Secondary | ICD-10-CM | POA: Diagnosis not present

## 2023-05-24 DIAGNOSIS — E559 Vitamin D deficiency, unspecified: Secondary | ICD-10-CM | POA: Diagnosis not present

## 2023-05-24 DIAGNOSIS — I129 Hypertensive chronic kidney disease with stage 1 through stage 4 chronic kidney disease, or unspecified chronic kidney disease: Secondary | ICD-10-CM | POA: Diagnosis not present

## 2023-05-24 DIAGNOSIS — R799 Abnormal finding of blood chemistry, unspecified: Secondary | ICD-10-CM | POA: Diagnosis not present

## 2023-05-25 LAB — LAB REPORT - SCANNED
A1c: 5.5
EGFR: 31

## 2023-05-27 ENCOUNTER — Encounter (HOSPITAL_BASED_OUTPATIENT_CLINIC_OR_DEPARTMENT_OTHER): Payer: Self-pay | Admitting: Cardiovascular Disease

## 2023-05-31 ENCOUNTER — Encounter: Payer: Self-pay | Admitting: Cardiovascular Disease

## 2023-05-31 ENCOUNTER — Ambulatory Visit: Payer: Medicare Other | Attending: Cardiovascular Disease | Admitting: Cardiovascular Disease

## 2023-05-31 VITALS — BP 160/50 | HR 50 | Ht 63.0 in | Wt 123.8 lb

## 2023-05-31 DIAGNOSIS — I10 Essential (primary) hypertension: Secondary | ICD-10-CM

## 2023-05-31 DIAGNOSIS — I480 Paroxysmal atrial fibrillation: Secondary | ICD-10-CM | POA: Diagnosis not present

## 2023-05-31 DIAGNOSIS — I739 Peripheral vascular disease, unspecified: Secondary | ICD-10-CM

## 2023-05-31 NOTE — Patient Instructions (Signed)
 Medication Instructions:  No changes *If you need a refill on your cardiac medications before your next appointment, please call your pharmacy*  Lab Work: none If you have labs (blood work) drawn today and your tests are completely normal, you will receive your results only by: MyChart Message (if you have MyChart) OR A paper copy in the mail If you have any lab test that is abnormal or we need to change your treatment, we will call you to review the results.  Testing/Procedures: none  Follow-Up: At [facility], you and your health needs are our priority.  As part of our continuing mission to provide you with exceptional heart care, our providers are all part of one team.  This team includes your primary Cardiologist (physician) and Advanced Practice Providers or APPs (Physician Assistants and Nurse Practitioners) who all work together to provide you with the care you need, when you need it.  Your next appointment:   12 month(s)  Provider:   Verne Carrow, MD        1st Floor: - Lobby - Registration  - Pharmacy  - Lab - Cafe  2nd Floor: - PV Lab - Diagnostic Testing (echo, CT, nuclear med)  3rd Floor: - Vacant  4th Floor: - TCTS (cardiothoracic surgery) - AFib Clinic - Structural Heart Clinic - Vascular Surgery  - Vascular Ultrasound  5th Floor: - HeartCare Cardiology (general and EP) - Clinical Pharmacy for coumadin, hypertension, lipid, weight-loss medications, and med management appointments    Valet parking services will be available as well.

## 2023-05-31 NOTE — Progress Notes (Signed)
 Chief Complaint  Patient presents with   Follow-up    HTN   History of Present Illness: 89 yo female with history of PVCs, PACs, HTN, CVA, PAF s/p MAZE 2006, PAD who is here today for cardiac follow up. I have followed her for atrial fibrillation and HTN. Her HTN has been difficult to control over the years. She has not tolerated Norvasc, Coreg, Bystolic or clonidine. I met her at [facility] in February 2013 when she was admitted with a headache and found to have sinus brady with elevated BP. She had initially been tried on Bystolic but did not tolerate due to bradycardia. She was started on Norvasc in the hospital but had burning in her legs so she stopped it. Echo 04/02/11 with normal LV size and function. Event monitor in 2013 showed NSR with PACs. Renal artery dopplers November 2013 without evidence of renal artery stenosis. She has PAD followed by Dr. Allyson Sabal and underwent successful balloon/stenting of an occluded right popliteal artery per Dr. Allyson Sabal in 2015. ABI stable Sept 2023. Echo June 2015 at [facility] with normal LV function. Cardiac monitor October 2017 with PVCs and PACs, no atrial fib. She was found to have atrial fibrillation in 2021 and was started on Cardizem and Eliqus. She is now followed in the HTN clinic by Dr. Duke Salvia. Last seen 05/10/23 and Protonix added for possible GERD/cough related to GERD. Renal artery dopplers normal in April 2022.   She is here today for follow up. The patient denies any chest pain, dyspnea, palpitations, lower extremity edema, orthopnea, PND, dizziness, near syncope or syncope. She stopped the PPI after two weeks as it did not help her cough. She describes a cough productive of clear sputum for the past few years.   Primary Care Physician:  Shelva Majestic, MD  Past Medical History:  Diagnosis Date   Arthritis    Chicken pox    Glaucoma of both eyes    High cholesterol    Hypertension    a. Normal renal arteries by duplex 2013.    Hypothyroidism    Migraine    PAD (peripheral artery disease) (HCC)    a. 04/2013: s/p PCI to occluded right popliteal artery    PAF (paroxysmal atrial fibrillation) (HCC)    a. s/p MAZE 2006. b. Event monitor 2013: NSR with PACs.   Sinus bradycardia    a. 03/2011 during hospital admission - beta blocker stopped.   Stroke [facility])    a. Pt reports 3-4 prior strokes without residual sx. b. CVA 2013 (presented with headache) - started on Plavix. Event monitor as outpt - no AF.    Past Surgical History:  Procedure Laterality Date   CATARACT EXTRACTION Left 11/09/2017   LOWER EXTREMITY ANGIOGRAM N/A 05/15/2013   Procedure: LOWER EXTREMITY ANGIOGRAM;  Surgeon: Runell Gess, MD;  Location: [facility] CATH LAB;  Service: Cardiovascular;  Laterality: N/A;   MAZE  ~ 2006   POPLITEAL ARTERY STENT  05/15/2013    Current Outpatient Medications  Medication Sig Dispense Refill   apixaban (ELIQUIS) 2.5 MG TABS tablet TAKE 1 TABLET(2.5 MG) BY MOUTH TWICE DAILY 180 tablet 3   ARMOUR THYROID 15 MG tablet TAKE 1 TABLET BY MOUTH EVERY DAY 90 tablet 1   benzonatate (TESSALON PERLES) 100 MG capsule Take 1 capsule (100 mg total) by mouth 3 (three) times daily as needed for cough. 30 capsule 0   chlorthalidone (HYGROTON) 25 MG tablet Take 1 tablet (25 mg total) by  mouth daily. 90 tablet 3   Digestive Enzyme CAPS Take 1 capsule by mouth See admin instructions. Mix the contents of 1 capsule into water and drink with all meals     dorzolamide-timolol (COSOPT) 22.3-6.8 MG/ML ophthalmic solution Place 1 drop into the left eye in the morning and at bedtime.      doxazosin (CARDURA) 2 MG tablet Take 1 tablet (2 mg total) by mouth at bedtime. 90 tablet 1   hydrALAZINE (APRESOLINE) 25 MG tablet TAKE 1 AND 1/2 TABLETS(37.5 MG) BY MOUTH THREE TIMES DAILY 150 tablet 3   loperamide (IMODIUM A-D) 2 MG tablet Take 1 tablet (2 mg total) by mouth 4 (four) times daily as needed for diarrhea or loose stools. 20 tablet 0   metoprolol  tartrate (LOPRESSOR) 25 MG tablet Take 0.5 tablets (12.5 mg total) by mouth daily as needed (HR >110 take only BID). 45 tablet 0   Multiple Vitamin (MULTIVITAMIN) LIQD Take 30 mLs by mouth daily.     Nutritional Supplements (FEEDING SUPPLEMENT, KATE FARMS STANDARD 1.4,) LIQD liquid Take 325 mLs by mouth 3 (three) times daily between meals. (Patient not taking: Reported on 05/10/2023)     pantoprazole (PROTONIX) 40 MG tablet Take 1 tablet (40 mg total) by mouth daily. 30 tablet 11   Papaya TABS Take 3 tablets by mouth 3 (three) times daily.      Probiotic CAPS Take 1 capsule by mouth daily.     No current facility-administered medications for this visit.    Allergies  Allergen Reactions   Aceon [Perindopril Erbumine] Cough   Amlodipine Other (See Comments)    Lower extremity discomfort   Aspirin Cough and Other (See Comments)    STOPPED BY A MEDICAL PROFESSIONAL   Azor [Amlodipine-Olmesartan] Cough   Beta Adrenergic Blockers Other (See Comments)    Bradycardia   Hydralazine Nausea And Vomiting and Other (See Comments)    Weakness- tolerating in 2021    Milk-Related Compounds Other (See Comments) and Cough    STOPPED BY A MEDICAL PROFESSIONAL   Other Other (See Comments) and Cough    NUTS- ALL TYPES- (STOPPED BY A MEDICAL PROFESSIONAL) NO FOODS THAT CAUSE GAS (will cause A-Fib)   Peanut-Containing Drug Products Other (See Comments) and Cough    STOPPED BY A MEDICAL PROFESSIONAL   Sulfamethoxazole-Trimethoprim Other (See Comments)    Patient cannot specifically recall the reaction   Vitamin D Analogs Cough    Social History   Socioeconomic History   Marital status: Widowed    Spouse name: Not on file   Number of children: Not on file   Years of education: Not on file   Highest education level: Not on file  Occupational History   Not on file  Tobacco Use   Smoking status: Never   Smokeless tobacco: Never  Vaping Use   Vaping status: Never Used  Substance and Sexual  Activity   Alcohol use: No    Alcohol/week: 0.0 standard drinks of alcohol   Drug use: No   Sexual activity: Never  Other Topics Concern   Not on file  Social History Narrative   Widowed. Lost husband to COVID-19 in September 2020. Married 65 years in 2016. Erhard 22 years in 2016. 4 children-3 boys, youngest girl [place] in [place]. 7 grandkids. 6 greatgrandkids.    Finished 10th grade.       Retired from working in [employer] for [employer]         Social Drivers of Health  Financial Resource Strain: Low Risk  (05/10/2020)   Overall Financial Resource Strain (CARDIA)    Difficulty of Paying Living Expenses: Not hard at all  Food Insecurity: No Food Insecurity (05/10/2020)   Hunger Vital Sign    Worried About Running Out of Food in the Last Year: Never true    Ran Out of Food in the Last Year: Never true  Transportation Needs: No Transportation Needs (05/10/2020)   PRAPARE - Administrator, Civil Service (Medical): No    Lack of Transportation (Non-Medical): No  Physical Activity: Not on file  Stress: Stress Concern Present (05/10/2020)   [employer] of Occupational Health - Occupational Stress Questionnaire    Feeling of Stress : To some extent  Social Connections: Not on file  Intimate Partner Violence: Not on file    Family History  Problem Relation Age of Onset   Hypertension Mother    Ulcers Father    Cervical cancer Sister    Throat cancer Brother    Lung cancer Brother    Cancer Brother    Allergic rhinitis Neg Hx    Angioedema Neg Hx    Asthma Neg Hx    Atopy Neg Hx    Eczema Neg Hx    Immunodeficiency Neg Hx    Urticaria Neg Hx     Review of Systems:  As stated in the HPI and otherwise negative.   BP (!) 160/50   Pulse (!) 50   Ht 5\' 3"  (1.6 m)   Wt 56.2 kg   SpO2 95%   BMI 21.93 kg/m   Physical Examination: General: Well developed, well nourished, NAD  HEENT: OP clear, mucus membranes moist  SKIN: warm, dry. No  rashes. Neuro: No focal deficits  Musculoskeletal: Muscle strength 5/5 all ext  Psychiatric: Mood and affect normal  Neck: No JVD, no carotid bruits, no thyromegaly, no lymphadenopathy.  Lungs:Clear bilaterally, no wheezes, rhonci, crackles Cardiovascular: Regular rate and rhythm. No murmurs, gallops or rubs. Abdomen:Soft. Bowel sounds present. Non-tender.  Extremities: No lower extremity edema. Pulses are 2 + in the bilateral DP/PT.  EKG:  EKG is not ordered today. The ekg ordered today demonstrates  Recent Labs: No results found for requested labs within last 365 days.   Lipid Panel    Component Value Date/Time   CHOL 188 05/26/2022 0956   TRIG 48.0 05/26/2022 0956   HDL 65.10 05/26/2022 0956   CHOLHDL 3 05/26/2022 0956   VLDL 9.6 05/26/2022 0956   LDLCALC 113 (H) 05/26/2022 0956   LDLCALC 133 (H) 12/06/2019 1500     Wt Readings from Last 3 Encounters:  05/31/23 56.2 kg  05/10/23 55.7 kg  04/02/23 55.6 kg    Assessment and Plan:   1. PAF/PACs/PVCs: Sinus today. She has had prior MAZE for PAF. Her CHADS VASC score is 7 (11.2% risk of stroke per year). Will continue metoprolol and Eliquis  2. HTN: She is followed in the HTN clinic by Dr. Duke Salvia. BP is mildly elevated today. Continue current therapy as outlined above.   3. PAD: She is followed in [facility] clinic by Dr. Allyson Sabal. No leg pain. ABI normal in September 2024.   Labs/ tests ordered today include:   No orders of the defined types were placed in this encounter.  Disposition:   F/U with me in 1 year.    Signed, Verne Carrow, MD 05/31/2023 11:35 AM    [facility] [address] [place], [place], [place]  [address]  Phone: [phone]; Fax: [phone]

## 2023-06-02 ENCOUNTER — Telehealth: Payer: Self-pay | Admitting: Internal Medicine

## 2023-06-02 NOTE — Telephone Encounter (Signed)
 Patient Phone Call   Received page regarding patients son, called back. Stated went into afib last night with [facility]. They had her take a metoprolol tartrate 12.5mg  once, with HR now in the 130s. Patient asymptomatic other than palpitations.  Plan: -Instructed patient to take a 2nd dose of metoprolol tartrate 12.5mg  now -Patients family to check HR in 3-5 hours, if remains elevated > 110 bpm, informed them they can take a 3rd dose of metoprolol tartrate -Informed in HR exceed 150 or if having significant uncontrollable symptoms with tachycardia (LH, dizziness, n/v, chest pain, sob) need to go to the ED overnight    Achille Rich, MD Cardiology

## 2023-06-10 ENCOUNTER — Telehealth: Payer: Self-pay | Admitting: Cardiovascular Disease

## 2023-06-10 ENCOUNTER — Encounter: Payer: Self-pay | Admitting: Cardiovascular Disease

## 2023-06-10 NOTE — Telephone Encounter (Signed)
 Patient's son, Ambrosio Junker, contacted the after-hours line.  Verified patient, [name], by name and date of birth.  Ambrosio Junker stated that at about 1:50 AM, his mother noted that her heart rate was elevated.  Ambrosio Junker evaluated her heart rate to range from 110 to 130 bpm.  This prompted him to give her 12.5 mg of metoprolol tartrate.  By the time I was able to reach them around 2:10 AM, Ambrosio Junker noted that her heart rate was back in the 60s-70s.  They were also worried about her blood pressure.  Last blood pressure taken was 101/55 which is generally speaking well for her.  She was not endorsing any dizziness/lightheadedness, chest discomfort or dyspnea.  She did still state that she could feel her heart was out of rhythm though not fast.  I stated that at this time, I did not think that they needed to seek urgent evaluation.  We discussed reasons to present to the emergency room including persistent heart rates above 110 bpm, new chest discomfort, dyspnea or lightheadedness/syncope.  They were in agreement with this plan.

## 2023-06-10 NOTE — Telephone Encounter (Signed)
 Spoke with [name] who reports episode of Afib last night as documented below.  [name] states current HR 52 with BP 127/59.  [name] denies current CP, SOB or dizziness and states she is not currently in Afib but would like to make Dr Abel Hoe aware  [name] advised will forward to provider and his RN for review.  [name] verbalizes understanding and thanked Charity fundraiser for the call.

## 2023-06-10 NOTE — Telephone Encounter (Signed)
error 

## 2023-06-10 NOTE — Telephone Encounter (Signed)
 Pt calling to speak with a nurse in regards to this phone note. Please advise

## 2023-06-16 ENCOUNTER — Ambulatory Visit (INDEPENDENT_AMBULATORY_CARE_PROVIDER_SITE_OTHER): Admitting: Family

## 2023-06-16 ENCOUNTER — Encounter: Payer: Self-pay | Admitting: Family

## 2023-06-16 ENCOUNTER — Ambulatory Visit (INDEPENDENT_AMBULATORY_CARE_PROVIDER_SITE_OTHER)

## 2023-06-16 VITALS — BP 148/62 | HR 51 | Temp 97.0°F | Ht 63.0 in | Wt 120.0 lb

## 2023-06-16 DIAGNOSIS — N8189 Other female genital prolapse: Secondary | ICD-10-CM | POA: Insufficient documentation

## 2023-06-16 DIAGNOSIS — N83209 Unspecified ovarian cyst, unspecified side: Secondary | ICD-10-CM | POA: Insufficient documentation

## 2023-06-16 DIAGNOSIS — I10 Essential (primary) hypertension: Secondary | ICD-10-CM | POA: Diagnosis present

## 2023-06-16 DIAGNOSIS — R1900 Intra-abdominal and pelvic swelling, mass and lump, unspecified site: Secondary | ICD-10-CM

## 2023-06-16 DIAGNOSIS — R053 Chronic cough: Secondary | ICD-10-CM | POA: Diagnosis not present

## 2023-06-16 DIAGNOSIS — R0982 Postnasal drip: Secondary | ICD-10-CM | POA: Diagnosis not present

## 2023-06-16 DIAGNOSIS — R079 Chest pain, unspecified: Secondary | ICD-10-CM | POA: Diagnosis not present

## 2023-06-16 DIAGNOSIS — R109 Unspecified abdominal pain: Secondary | ICD-10-CM | POA: Insufficient documentation

## 2023-06-16 DIAGNOSIS — R300 Dysuria: Secondary | ICD-10-CM | POA: Insufficient documentation

## 2023-06-16 DIAGNOSIS — I517 Cardiomegaly: Secondary | ICD-10-CM | POA: Diagnosis not present

## 2023-06-16 DIAGNOSIS — I4891 Unspecified atrial fibrillation: Secondary | ICD-10-CM | POA: Insufficient documentation

## 2023-06-16 DIAGNOSIS — N760 Acute vaginitis: Secondary | ICD-10-CM

## 2023-06-16 DIAGNOSIS — I639 Cerebral infarction, unspecified: Secondary | ICD-10-CM | POA: Insufficient documentation

## 2023-06-16 HISTORY — DX: Cerebral infarction, unspecified: I63.9

## 2023-06-16 HISTORY — DX: Other female genital prolapse: N81.89

## 2023-06-16 HISTORY — DX: Intra-abdominal and pelvic swelling, mass and lump, unspecified site: R19.00

## 2023-06-16 HISTORY — DX: Acute vaginitis: N76.0

## 2023-06-16 HISTORY — DX: Unspecified abdominal pain: R10.9

## 2023-06-16 HISTORY — DX: Dysuria: R30.0

## 2023-06-16 HISTORY — DX: Unspecified ovarian cyst, unspecified side: N83.209

## 2023-06-16 MED ORDER — TRIAMCINOLONE ACETONIDE 55 MCG/ACT NA AERO
1.0000 | INHALATION_SPRAY | Freq: Every day | NASAL | 2 refills | Status: DC
Start: 2023-06-16 — End: 2023-09-14

## 2023-06-16 NOTE — Progress Notes (Signed)
 Patient ID: [name], female    DOB: 1926/02/22, 89 y.o.   MRN: [id number]  Chief Complaint  Patient presents with   Cough    Pt c/o cough with clear mucus which is causing chest pain, present for 3 weeks. Pt would like chest XRAY.    Discussed the use of AI scribe software for clinical note transcription with the patient, who gave verbal consent to proceed.  History of Present Illness The patient, with a history of atrial fibrillation, hypertension, and thyroid  issues, presents with a chronic cough that has been ongoing for three months. The cough is described as constant and daily, leading to chest pain due to the intensity of the coughing. The patient reports production of white mucus when coughing, which if not expelled, leads to gagging. The patient also experiences runny eyes and nose when coughing. The patient denies any history of spring allergies, except for grass and pollen. The patient has had a chronic cough at different times for the last twenty to thirty years. The patient also reports a recent fall, falling on her right side, leading to soreness in the arm and under the arm. The patient denies any rib pain or difficulty breathing due to the fall.  Assessment & Plan Chronic cough - Lungs clear on exam. Chronic cough for three months with clear mucus, and hx of long spells of coughing noted after review of chart. Previous treatments ineffective with Tessalon  pearles and Protonix  trial. No recent CXR, last one 2021, pt requesting. Believe current cough likely due to postnasal drip or allergies. - Order chest x-ray. - Ok to take 2 regular or extra strength Tylenol  3 times per day to help with pleuritic chest pain. - Recommend trial of Nasacort  nasal spray. - Advised on proper swallowing techniques for pills, try 1 or 2 at most at a time, back of throat, bend neck forward and then swallow. - Consider using thicker liquids, applesauce or pudding for pill swallowing. - Will call  with results once CXR read by Radiology. - F/U with PCP regarding Nasacort  if helping cough      Subjective:    Outpatient Medications Prior to Visit  Medication Sig Dispense Refill   apixaban  (ELIQUIS ) 2.5 MG TABS tablet TAKE 1 TABLET(2.5 MG) BY MOUTH TWICE DAILY 180 tablet 3   ARMOUR THYROID  15 MG tablet TAKE 1 TABLET BY MOUTH EVERY DAY 90 tablet 1   chlorthalidone  (HYGROTON ) 25 MG tablet Take 1 tablet (25 mg total) by mouth daily. 90 tablet 3   Digestive Enzyme CAPS Take 1 capsule by mouth See admin instructions. Mix the contents of 1 capsule into water and drink with all meals     dorzolamide-timolol (COSOPT) 22.3-6.8 MG/ML ophthalmic solution Place 1 drop into the left eye in the morning and at bedtime.      doxazosin  (CARDURA ) 2 MG tablet Take 1 tablet (2 mg total) by mouth at bedtime. 90 tablet 1   hydrALAZINE  (APRESOLINE ) 25 MG tablet TAKE 1 AND 1/2 TABLETS(37.5 MG) BY MOUTH THREE TIMES DAILY 150 tablet 3   loperamide  (IMODIUM  A-D) 2 MG tablet Take 1 tablet (2 mg total) by mouth 4 (four) times daily as needed for diarrhea or loose stools. 20 tablet 0   metoprolol  tartrate (LOPRESSOR ) 25 MG tablet Take 0.5 tablets (12.5 mg total) by mouth daily as needed (HR >110 take only BID). 45 tablet 0   Multiple Vitamin (MULTIVITAMIN) LIQD Take 30 mLs by mouth daily.     Nutritional  Supplements (FEEDING SUPPLEMENT, KATE FARMS STANDARD 1.4,) LIQD liquid Take 325 mLs by mouth 3 (three) times daily between meals.     pantoprazole  (PROTONIX ) 40 MG tablet Take 1 tablet (40 mg total) by mouth daily. 30 tablet 11   Papaya TABS Take 3 tablets by mouth 3 (three) times daily.      Probiotic CAPS Take 1 capsule by mouth daily.     benzonatate  (TESSALON  PERLES) 100 MG capsule Take 1 capsule (100 mg total) by mouth 3 (three) times daily as needed for cough. (Patient not taking: Reported on 06/16/2023) 30 capsule 0   No facility-administered medications prior to visit.   Past Medical History:  Diagnosis  Date   Abdominal pain 06/16/2023   Arthritis    Cerebrovascular accident (HCC) 06/16/2023   Chicken pox    Cyst of ovary 06/16/2023   Dysuria 06/16/2023   Glaucoma of both eyes    High cholesterol    Hypertension    a. Normal renal arteries by duplex 2013.   Hypothyroidism    Migraine    Other female genital prolapse 06/16/2023   PAD (peripheral artery disease) (HCC)    a. 04/2013: s/p PCI to occluded right popliteal artery    PAF (paroxysmal atrial fibrillation) (HCC)    a. s/p MAZE 2006. b. Event monitor 2013: NSR with PACs.   Pelvic mass 06/16/2023   Sinus bradycardia    a. 03/2011 during hospital admission - beta blocker stopped.   Stroke [facility])    a. Pt reports 3-4 prior strokes without residual sx. b. CVA 2013 (presented with headache) - started on Plavix . Event monitor as outpt - no AF.   Vulvovaginitis 06/16/2023   Past Surgical History:  Procedure Laterality Date   CATARACT EXTRACTION Left 11/09/2017   LOWER EXTREMITY ANGIOGRAM N/A 05/15/2013   Procedure: LOWER EXTREMITY ANGIOGRAM;  Surgeon: Avanell Leigh, MD;  Location: [facility] CATH LAB;  Service: Cardiovascular;  Laterality: N/A;   MAZE  ~ 2006   POPLITEAL ARTERY STENT  05/15/2013   Allergies  Allergen Reactions   Aceon [Perindopril Erbumine] Cough   Amlodipine  Other (See Comments)    Lower extremity discomfort   Aspirin Cough and Other (See Comments)    STOPPED BY A MEDICAL PROFESSIONAL   Azor [Amlodipine -Olmesartan ] Cough   Beta Adrenergic Blockers Other (See Comments)    Bradycardia   Hydralazine  Nausea And Vomiting and Other (See Comments)    Weakness- tolerating in 2021    Milk-Related Compounds Other (See Comments) and Cough    STOPPED BY A MEDICAL PROFESSIONAL   Other Other (See Comments) and Cough    NUTS- ALL TYPES- (STOPPED BY A MEDICAL PROFESSIONAL) NO FOODS THAT CAUSE GAS (will cause A-Fib)   Peanut-Containing Drug Products Other (See Comments) and Cough    STOPPED BY A MEDICAL PROFESSIONAL    Sulfamethoxazole-Trimethoprim Other (See Comments)    Patient cannot specifically recall the reaction   Vitamin D  Analogs Cough      Objective:    Physical Exam Vitals and nursing note reviewed.  Constitutional:      Appearance: Normal appearance.  HENT:     Nose: No congestion or rhinorrhea.     Mouth/Throat:     Mouth: Mucous membranes are moist.     Pharynx: Postnasal drip present. No pharyngeal swelling, oropharyngeal exudate, posterior oropharyngeal erythema or uvula swelling.  Cardiovascular:     Rate and Rhythm: Normal rate and regular rhythm.  Pulmonary:     Effort: Pulmonary effort is normal.  Breath sounds: Normal breath sounds.  Musculoskeletal:        General: Normal range of motion.  Skin:    General: Skin is warm and dry.  Neurological:     Mental Status: She is alert.  Psychiatric:        Mood and Affect: Mood normal.        Behavior: Behavior normal.    BP (!) 148/62 (BP Location: Left Arm, Cuff Size: Small)   Pulse (!) 51   Temp (!) 97 F (36.1 C) (Temporal)   Ht 5\' 3"  (1.6 m)   Wt 120 lb (54.4 kg)   SpO2 100%   BMI 21.26 kg/m  Wt Readings from Last 3 Encounters:  06/16/23 120 lb (54.4 kg)  05/31/23 123 lb 12.8 oz (56.2 kg)  05/10/23 122 lb 14.4 oz (55.7 kg)     Versa Gore, NP

## 2023-06-18 ENCOUNTER — Telehealth: Payer: Self-pay

## 2023-06-18 NOTE — Telephone Encounter (Signed)
 Copied from CRM [id number]. Topic: Clinical - Prescription Issue >> Jun 18, 2023  1:47 PM Elle L wrote: Reason for CRM: The patient's son, Avonne Lemons, states that the pharmacy did not receive the patient's prescription for triamcinolone  (NASACORT ) 55 MCG/ACT AERO nasal inhaler.  Looks like you seen this pt on 06/16/2023 and was to have her try Nasocort but I do not see it on medication list.  Please Advise.  Message has been sent to provider to address

## 2023-06-21 NOTE — Telephone Encounter (Signed)
 please check again, it was sent to [facility]

## 2023-06-29 DIAGNOSIS — N8111 Cystocele, midline: Secondary | ICD-10-CM | POA: Diagnosis not present

## 2023-06-29 DIAGNOSIS — N362 Urethral caruncle: Secondary | ICD-10-CM | POA: Diagnosis not present

## 2023-06-29 DIAGNOSIS — N816 Rectocele: Secondary | ICD-10-CM | POA: Diagnosis not present

## 2023-07-01 ENCOUNTER — Encounter (HOSPITAL_BASED_OUTPATIENT_CLINIC_OR_DEPARTMENT_OTHER): Payer: Self-pay | Admitting: Family

## 2023-07-01 ENCOUNTER — Ambulatory Visit (HOSPITAL_BASED_OUTPATIENT_CLINIC_OR_DEPARTMENT_OTHER): Admitting: Family

## 2023-07-01 VITALS — BP 151/63 | HR 51 | Ht 63.0 in | Wt 121.5 lb

## 2023-07-01 DIAGNOSIS — D6859 Other primary thrombophilia: Secondary | ICD-10-CM

## 2023-07-01 DIAGNOSIS — I10 Essential (primary) hypertension: Secondary | ICD-10-CM

## 2023-07-01 DIAGNOSIS — I480 Paroxysmal atrial fibrillation: Secondary | ICD-10-CM | POA: Diagnosis not present

## 2023-07-01 MED ORDER — DOXAZOSIN MESYLATE 2 MG PO TABS: 3.0000 mg | ORAL_TABLET | Freq: Every day | ORAL | 3 refills | Status: AC

## 2023-07-01 NOTE — Progress Notes (Signed)
 [facility] Assessment:    Date:  07/04/2023   ID:  [name], DOB [birth year], MRN [id number]  PCP:  Almira Jaeger, MD  Cardiologist:  Antoinette Batman, MD  Nephrologist:  Referring MD: Almira Jaeger, MD   CC: Hypertension  History of Present Illness:    [name] is a 89 y.o. female with a hx of PAF, HTN, HLD, CVA, CKD3b here to follow up in the [facility].   Diagnosed with hypertension in her 31s. Prior renal doppler 2013 normal. Established with [facility] 04/2020. Previously dizzy with higher doses of Hydralazine . Repeat renal doppler 05/2020 with no renal artery stenosis. Her husband passed away 2020-11-14 from COVID-19, they both were hospitalized at the same time.   When seen 05/10/23 by Dr. Theodis Fiscal in [facility] she noted persistent cough lasting for a month, that then subsided for a month, then returned. Accompanied by "slick slimy stuff". Previously workup with ENT unremarkable. Protonix  was initiated.   She presents today for follow up with family member. Notes episodes of atrial fibrillation once per week for the last month lasting up to 4 hours. Yesterday had to take two half tablets of Metoprolol  a couple hours apart to quiet her palpitations. Episodes seem to be triggered by gas.   Reports no recurrent cough. Did not feel that her cough imporved with protnoix. Notes cough is intermittent. "Once it starts it won't stop and it bring sup this slick slimy stuff and causes a strain on by body, coughing and gagging". She felt tessalon  made her cough more and has discontinued.  Home BP 130-150 may jump up to 160 if she misses a dose of medication. Drinks tea, coconut water and tries to stay well hydrated.   Previous antihypertensives: nebivolol-bradycardia HCTZ-dysuria Amlodipine - edema Hydralazine -dizziness Clonidine -bradycardia Chlorthalidone -cough Irbesartan -cough   Past Medical  History:  Diagnosis Date   Abdominal pain 06/16/2023   Arthritis    Cerebrovascular accident (HCC) 06/16/2023   Chicken pox    Cyst of ovary 06/16/2023   Dysuria 06/16/2023   Glaucoma of both eyes    High cholesterol    Hypertension    a. Normal renal arteries by duplex 2013.   Hypothyroidism    Migraine    Other female genital prolapse 06/16/2023   PAD (peripheral artery disease) (HCC)    a. 04/2013: s/p PCI to occluded right popliteal artery    PAF (paroxysmal atrial fibrillation) (HCC)    a. s/p MAZE 2006. b. Event monitor 2013: NSR with PACs.   Pelvic mass 06/16/2023   Sinus bradycardia    a. 03/2011 during hospital admission - beta blocker stopped.   Stroke [facility])    a. Pt reports 3-4 prior strokes without residual sx. b. CVA 2013 (presented with headache) - started on Plavix . Event monitor as outpt - no AF.   Vulvovaginitis 06/16/2023    Past Surgical History:  Procedure Laterality Date   CATARACT EXTRACTION Left 11/09/2017   LOWER EXTREMITY ANGIOGRAM N/A 05/15/2013   Procedure: LOWER EXTREMITY ANGIOGRAM;  Surgeon: Avanell Leigh, MD;  Location: [facility] CATH LAB;  Service: Cardiovascular;  Laterality: N/A;   MAZE  ~ 2006   POPLITEAL ARTERY STENT  05/15/2013    Current Medications: Current Meds  Medication Sig   apixaban  (ELIQUIS ) 2.5 MG TABS tablet TAKE 1 TABLET(2.5 MG) BY MOUTH TWICE DAILY   ARMOUR THYROID  15 MG tablet TAKE 1 TABLET BY MOUTH EVERY DAY   chlorthalidone  (HYGROTON ) 25 MG tablet  Take 1 tablet (25 mg total) by mouth daily.   dorzolamide-timolol (COSOPT) 22.3-6.8 MG/ML ophthalmic solution Place 1 drop into the left eye in the morning and at bedtime.    hydrALAZINE  (APRESOLINE ) 25 MG tablet TAKE 1 AND 1/2 TABLETS(37.5 MG) BY MOUTH THREE TIMES DAILY   loperamide  (IMODIUM  A-D) 2 MG tablet Take 1 tablet (2 mg total) by mouth 4 (four) times daily as needed for diarrhea or loose stools.   metoprolol  tartrate (LOPRESSOR ) 25 MG tablet Take 0.5 tablets (12.5 mg total) by  mouth daily as needed (HR >110 take only BID).   Multiple Vitamin (MULTIVITAMIN) LIQD Take 30 mLs by mouth daily.   Nutritional Supplements (FEEDING SUPPLEMENT, KATE FARMS STANDARD 1.4,) LIQD liquid Take 325 mLs by mouth 3 (three) times daily between meals.   Papaya TABS Take 3 tablets by mouth 3 (three) times daily.    Probiotic CAPS Take 1 capsule by mouth daily.   [DISCONTINUED] doxazosin  (CARDURA ) 2 MG tablet Take 1 tablet (2 mg total) by mouth at bedtime.     Allergies:   Aceon [perindopril erbumine], Amlodipine , Aspirin, Azor [amlodipine -olmesartan ], Beta adrenergic blockers, Hydralazine , Milk-related compounds, Other, Peanut-containing drug products, Sulfamethoxazole-trimethoprim, and Vitamin d  analogs   Social History   Socioeconomic History   Marital status: Widowed    Spouse name: Not on file   Number of children: Not on file   Years of education: Not on file   Highest education level: Not on file  Occupational History   Not on file  Tobacco Use   Smoking status: Never   Smokeless tobacco: Never  Vaping Use   Vaping status: Never Used  Substance and Sexual Activity   Alcohol use: No    Alcohol/week: 0.0 standard drinks of alcohol   Drug use: No   Sexual activity: Never  Other Topics Concern   Not on file  Social History Narrative   Widowed. Lost husband to COVID-19 in September 2020. Married 65 years in 2016. [place] 22 years in 2016. 4 children-3 boys, youngest girl chiropractor in [place] . 7 grandkids. 6 greatgrandkids.    Finished 10th grade.       Retired from working in [employer] for [employer]         Social Drivers of [employer]: Low Risk  (05/10/2020)   Overall Financial Resource Strain (CARDIA)    Difficulty of Paying Living Expenses: Not hard at all  Food Insecurity: No Food Insecurity (05/10/2020)   Hunger Vital Sign    Worried About Running Out of Food in the Last Year: Never true    Ran Out of Food in the Last Year: Never  true  Transportation Needs: No Transportation Needs (05/10/2020)   PRAPARE - Administrator, Civil Service (Medical): No    Lack of Transportation (Non-Medical): No  Physical Activity: Not on file  Stress: Stress Concern Present (05/10/2020)   [employer] of Occupational Health - Occupational Stress Questionnaire    Feeling of Stress : To some extent  Social Connections: Not on file     Family History: The patient's family history includes Cancer in her brother; Cervical cancer in her sister; Hypertension in her mother; Lung cancer in her brother; Throat cancer in her brother; Ulcers in her father. There is no history of Allergic rhinitis, Angioedema, Asthma, Atopy, Eczema, Immunodeficiency, or Urticaria.  ROS:   Please see the history of present illness.     All other systems reviewed and are negative.  EKGs/Labs/Other Studies Reviewed:         Recent Labs: 07/01/2023: Magnesium 2.3 07/02/2023: ALT 12; BUN 25; Creatinine, Ser 1.50; Hemoglobin 11.2; Platelets 173; Potassium 3.9; Sodium 137   Recent Lipid Panel    Component Value Date/Time   CHOL 188 05/26/2022 0956   TRIG 48.0 05/26/2022 0956   HDL 65.10 05/26/2022 0956   CHOLHDL 3 05/26/2022 0956   VLDL 9.6 05/26/2022 0956   LDLCALC 113 (H) 05/26/2022 0956   LDLCALC 133 (H) 12/06/2019 1500    Physical Exam:   VS:  BP (!) 151/63   Pulse (!) 51   Ht 5\' 3"  (1.6 m)   Wt 121 lb 7.5 oz (55.1 kg)   SpO2 100%   BMI 21.52 kg/m  , BMI Body mass index is 21.52 kg/m. GENERAL:  Well appearing HEENT: Pupils equal round and reactive, fundi not visualized, oral mucosa unremarkable NECK:  No jugular venous distention, waveform within normal limits, carotid upstroke brisk and symmetric, no bruits, no thyromegaly LYMPHATICS:  No cervical adenopathy LUNGS:  Clear to auscultation bilaterally HEART:  RRR.  PMI not displaced or sustained,S1 and S2 within normal limits, no S3, no S4, no clicks, no rubs, no murmurs ABD:   Flat, positive bowel sounds normal in frequency in pitch, no bruits, no rebound, no guarding, no midline pulsatile mass, no hepatomegaly, no splenomegaly EXT:  2 plus pulses throughout, no edema, no cyanosis no clubbing SKIN:  No rashes no nodules NEURO:  Cranial nerves II through XII grossly intact, motor grossly intact throughout PSYCH:  Cognitively intact, oriented to person place and time   ASSESSMENT/PLAN:    HTN - BP not at goal <140/90 (reasonable goal given age, body habitus).Continue Chlorthlaidone 25mg  daily, Hydralazine  25mg  BID. Increase doxazosin  from 2mg  to 3mg  at bedtime Discussed to monitor BP at home at least 2 hours after medications and sitting for 5-10 minutes.   Chronic cough - unimproved with Protonix , may discontinue. Felt Tessalon  made her cough more. ENT workup previously unremarkable.   GERD - suspected contributor to cough, PAF. No improvement with protonix . Recommend further discussion with PCP or GI.   PAF / Hypercoagulable state - RRR by auscultation today. Reports atrial fib episodes up to 4 hours over the last month often triggered by gas. Update CBC, magnesium, BMET to assess for etiology. Encouraged to use Metoprolol  12.5mg  for HR >110bpm and take second 12.5mg  dose if HR >110 after 30 minutes rather than waiting a couple hours to take additional dose. CHA2DS2-VASc Score = 7 [CHF History: 0, HTN History: 1, Diabetes History: 0, Stroke History: 2, Vascular Disease History: 1, Age Score: 2, Gender Score: 1].  Therefore, the patient's annual risk of stroke is 11.2 %.    Continue Eliquis  2.5mg  BID, reduced dose due to age and body habitus.  Screening for Secondary Hypertension:     Relevant Labs/Studies:    Latest Ref Rng & Units 07/02/2023    8:46 PM 07/02/2023    8:43 PM 07/01/2023    3:29 PM  Basic Labs  Sodium 135 - 145 mmol/L 137  136  137   Potassium 3.5 - 5.1 mmol/L 3.9  3.8  4.5   Creatinine 0.44 - 1.00 mg/dL 1.61  0.96  0.45        Latest Ref Rng &  Units 05/26/2022    9:56 AM 11/04/2021    1:54 PM  Thyroid    TSH 0.35 - 5.50 uIU/mL 3.47  4.43  05/29/2020   11:48 AM  Renovascular   Renal Artery US  Completed Yes      Disposition:    FU with MD/APP/PharmD in 2-3 months    Medication Adjustments/Labs and Tests Ordered: Current medicines are reviewed at length with the patient today.  Concerns regarding medicines are outlined above.  Orders Placed This Encounter  Procedures   Basic metabolic panel with GFR   Magnesium   CBC   Meds ordered this encounter  Medications   doxazosin  (CARDURA ) 2 MG tablet    Sig: Take 1.5 tablets (3 mg total) by mouth at bedtime.    Dispense:  135 tablet    Refill:  3    NEW DOSE, D/C PREVIOUS RX     Signed, Clearnce Curia, NP  07/04/2023 5:58 PM    [facility]

## 2023-07-01 NOTE — Patient Instructions (Addendum)
 Medication Instructions:  Your physician has recommended you make the following change in your medication:   Change: Doxazosin  3mg  daily at night    Labwork: Labs today- BMP, MAG, and CBC   Follow-Up: Please follow up in 2-3 months in [facility] with Dr. Theodis Fiscal, Neomi Banks, NP or Donivan Furry PharmD    Special Instructions:  To prevent palpitations: Make sure you are adequately hydrated.  Avoid and/or limit caffeine containing beverages like soda or tea. Exercise regularly.  Manage stress well. Some over the counter medications can cause palpitations such as Benadryl, AdvilPM, TylenolPM. Regular Advil or Tylenol  do not cause palpitations.    To prevent or reduce lower extremity swelling: Sit with legs elevated. For example, in the recliner or on an ottoman.  Wear knee-high compression stockings during the daytime. Ones labeled 15-20 mmHg provide good compression. Can purchase ones with zipper!

## 2023-07-02 ENCOUNTER — Encounter (HOSPITAL_BASED_OUTPATIENT_CLINIC_OR_DEPARTMENT_OTHER): Payer: Self-pay

## 2023-07-02 ENCOUNTER — Emergency Department (HOSPITAL_COMMUNITY)
Admission: EM | Admit: 2023-07-02 | Discharge: 2023-07-03 | Disposition: A | Discharge status: Home / Self Care | Attending: Emergency Medicine | Admitting: Emergency Medicine

## 2023-07-02 ENCOUNTER — Encounter (HOSPITAL_COMMUNITY): Payer: Self-pay

## 2023-07-02 ENCOUNTER — Other Ambulatory Visit: Payer: Self-pay

## 2023-07-02 ENCOUNTER — Emergency Department (HOSPITAL_COMMUNITY)

## 2023-07-02 DIAGNOSIS — I491 Atrial premature depolarization: Secondary | ICD-10-CM | POA: Insufficient documentation

## 2023-07-02 DIAGNOSIS — Z8673 Personal history of transient ischemic attack (TIA), and cerebral infarction without residual deficits: Secondary | ICD-10-CM | POA: Insufficient documentation

## 2023-07-02 DIAGNOSIS — D72819 Decreased white blood cell count, unspecified: Secondary | ICD-10-CM | POA: Diagnosis not present

## 2023-07-02 DIAGNOSIS — E039 Hypothyroidism, unspecified: Secondary | ICD-10-CM | POA: Insufficient documentation

## 2023-07-02 DIAGNOSIS — I700 Atherosclerosis of aorta: Secondary | ICD-10-CM | POA: Insufficient documentation

## 2023-07-02 DIAGNOSIS — I6782 Cerebral ischemia: Secondary | ICD-10-CM | POA: Insufficient documentation

## 2023-07-02 DIAGNOSIS — I6529 Occlusion and stenosis of unspecified carotid artery: Secondary | ICD-10-CM | POA: Diagnosis not present

## 2023-07-02 DIAGNOSIS — I499 Cardiac arrhythmia, unspecified: Secondary | ICD-10-CM | POA: Diagnosis not present

## 2023-07-02 DIAGNOSIS — D649 Anemia, unspecified: Secondary | ICD-10-CM | POA: Insufficient documentation

## 2023-07-02 DIAGNOSIS — J323 Chronic sphenoidal sinusitis: Secondary | ICD-10-CM | POA: Diagnosis not present

## 2023-07-02 DIAGNOSIS — Z8249 Family history of ischemic heart disease and other diseases of the circulatory system: Secondary | ICD-10-CM | POA: Insufficient documentation

## 2023-07-02 DIAGNOSIS — I10 Essential (primary) hypertension: Secondary | ICD-10-CM | POA: Diagnosis not present

## 2023-07-02 DIAGNOSIS — R262 Difficulty in walking, not elsewhere classified: Secondary | ICD-10-CM | POA: Diagnosis not present

## 2023-07-02 DIAGNOSIS — R531 Weakness: Secondary | ICD-10-CM | POA: Insufficient documentation

## 2023-07-02 DIAGNOSIS — R29818 Other symptoms and signs involving the nervous system: Secondary | ICD-10-CM | POA: Diagnosis not present

## 2023-07-02 LAB — URINALYSIS, ROUTINE W REFLEX MICROSCOPIC
Bilirubin Urine: NEGATIVE
Glucose, UA: NEGATIVE mg/dL
Hgb urine dipstick: NEGATIVE
Ketones, ur: NEGATIVE mg/dL
Nitrite: NEGATIVE
Protein, ur: NEGATIVE mg/dL
Specific Gravity, Urine: 1.004 — ABNORMAL LOW (ref 1.005–1.030)
pH: 8 (ref 5.0–8.0)

## 2023-07-02 LAB — CBC
HCT: 32.3 % — ABNORMAL LOW (ref 36.0–46.0)
Hematocrit: 31.9 % — ABNORMAL LOW (ref 34.0–46.6)
Hemoglobin: 10.4 g/dL — ABNORMAL LOW (ref 11.1–15.9)
Hemoglobin: 10.3 g/dL — ABNORMAL LOW (ref 12.0–15.0)
MCH: 26.7 pg (ref 26.6–33.0)
MCH: 26.8 pg (ref 26.0–34.0)
MCHC: 32.6 g/dL (ref 31.5–35.7)
MCHC: 31.9 g/dL (ref 30.0–36.0)
MCV: 82 fL (ref 79–97)
MCV: 84.1 fL (ref 80.0–100.0)
Platelets: 164 10*3/uL (ref 150–450)
Platelets: 173 10*3/uL (ref 150–400)
RBC: 3.89 x10E6/uL (ref 3.77–5.28)
RBC: 3.84 MIL/uL — ABNORMAL LOW (ref 3.87–5.11)
RDW: 13.2 % (ref 11.7–15.4)
RDW: 14.2 % (ref 11.5–15.5)
WBC: 2.8 10*3/uL — ABNORMAL LOW (ref 3.4–10.8)
WBC: 3.3 10*3/uL — ABNORMAL LOW (ref 4.0–10.5)
nRBC: 0 % (ref 0.0–0.2)

## 2023-07-02 LAB — COMPREHENSIVE METABOLIC PANEL WITH GFR
ALT: 12 U/L (ref 0–44)
AST: 20 U/L (ref 15–41)
Albumin: 3.8 g/dL (ref 3.5–5.0)
Alkaline Phosphatase: 60 U/L (ref 38–126)
Anion gap: 9 (ref 5–15)
BUN: 25 mg/dL — ABNORMAL HIGH (ref 8–23)
CO2: 23 mmol/L (ref 22–32)
Calcium: 9 mg/dL (ref 8.9–10.3)
Chloride: 104 mmol/L (ref 98–111)
Creatinine, Ser: 1.46 mg/dL — ABNORMAL HIGH (ref 0.44–1.00)
GFR, Estimated: 33 mL/min — ABNORMAL LOW (ref >60)
Glucose, Bld: 110 mg/dL — ABNORMAL HIGH (ref 70–99)
Potassium: 3.8 mmol/L (ref 3.5–5.1)
Sodium: 136 mmol/L (ref 135–145)
Total Bilirubin: 0.6 mg/dL (ref 0.0–1.2)
Total Protein: 6.8 g/dL (ref 6.5–8.1)

## 2023-07-02 LAB — BASIC METABOLIC PANEL WITH GFR
BUN/Creatinine Ratio: 17 (ref 12–28)
BUN: 24 mg/dL (ref 10–36)
CO2: 23 mmol/L (ref 20–29)
Calcium: 9.5 mg/dL (ref 8.7–10.3)
Chloride: 100 mmol/L (ref 96–106)
Creatinine, Ser: 1.4 mg/dL — ABNORMAL HIGH (ref 0.57–1.00)
Glucose: 100 mg/dL — ABNORMAL HIGH (ref 70–99)
Potassium: 4.5 mmol/L (ref 3.5–5.2)
Sodium: 137 mmol/L (ref 134–144)
eGFR: 35 mL/min/{1.73_m2} — ABNORMAL LOW (ref >59)

## 2023-07-02 LAB — DIFFERENTIAL
Abs Immature Granulocytes: 0.01 10*3/uL (ref 0.00–0.07)
Basophils Absolute: 0 10*3/uL (ref 0.0–0.1)
Basophils Relative: 0 %
Eosinophils Absolute: 0 10*3/uL (ref 0.0–0.5)
Eosinophils Relative: 1 %
Immature Granulocytes: 0 %
Lymphocytes Relative: 23 %
Lymphs Abs: 0.7 10*3/uL (ref 0.7–4.0)
Monocytes Absolute: 0.6 10*3/uL (ref 0.1–1.0)
Monocytes Relative: 19 %
Neutro Abs: 1.8 10*3/uL (ref 1.7–7.7)
Neutrophils Relative %: 57 %

## 2023-07-02 LAB — ETHANOL: Alcohol, Ethyl (B): <15 mg/dL (ref <15)

## 2023-07-02 LAB — RAPID URINE DRUG SCREEN, HOSP PERFORMED
Amphetamines: NOT DETECTED
Barbiturates: NOT DETECTED
Benzodiazepines: NOT DETECTED
Cocaine: NOT DETECTED
Opiates: NOT DETECTED
Tetrahydrocannabinol: NOT DETECTED

## 2023-07-02 LAB — I-STAT CHEM 8, ED
BUN: 25 mg/dL — ABNORMAL HIGH (ref 8–23)
Calcium, Ion: 1.14 mmol/L — ABNORMAL LOW (ref 1.15–1.40)
Chloride: 102 mmol/L (ref 98–111)
Creatinine, Ser: 1.5 mg/dL — ABNORMAL HIGH (ref 0.44–1.00)
Glucose, Bld: 105 mg/dL — ABNORMAL HIGH (ref 70–99)
HCT: 33 % — ABNORMAL LOW (ref 36.0–46.0)
Hemoglobin: 11.2 g/dL — ABNORMAL LOW (ref 12.0–15.0)
Potassium: 3.9 mmol/L (ref 3.5–5.1)
Sodium: 137 mmol/L (ref 135–145)
TCO2: 20 mmol/L — ABNORMAL LOW (ref 22–32)

## 2023-07-02 LAB — PROTIME-INR
INR: 1.2 (ref 0.8–1.2)
Prothrombin Time: 15.8 s — ABNORMAL HIGH (ref 11.4–15.2)

## 2023-07-02 LAB — MAGNESIUM: Magnesium: 2.3 mg/dL (ref 1.6–2.3)

## 2023-07-02 LAB — APTT: aPTT: 53 s — ABNORMAL HIGH (ref 24–36)

## 2023-07-02 MED ORDER — IOHEXOL 350 MG/ML SOLN
75.0000 mL | Freq: Once | INTRAVENOUS | Status: AC | PRN
  Administered 2023-07-02: 75 mL via INTRAVENOUS

## 2023-07-02 NOTE — ED Provider Notes (Signed)
 I saw and evaluated the patient, reviewed the resident's note and I agree with the findings and plan.  EKG Interpretation Date/Time:  Friday Jul 02 2023 20:17:14 EDT Ventricular Rate:  77 PR Interval:  166 QRS Duration:  108 QT Interval:  459 QTC Calculation: 520 R Axis:   128  Text Interpretation: Sinus rhythm Atrial premature complex S1,S2,S3 pattern Abnormal R-wave progression, early transition Nonspecific repol abnormality, diffuse leads Prolonged QT interval No significant change since last tracing Confirmed by Lind Repine ([id number]) on 07/02/2023 8:25:32 PM   Patient's EKG shows normal sinus rhythm.  This is a 89 year old patient presents with 4+ hours of right lower extremity weakness.  Patient is on Eliquis .  Past for history of PAF.  Denies any other weakness at this time.  On exam, patient does have some trouble walking.  Denies any back pain.  No bowel or bladder dysfunction.  Will image at this time and reexamine   Lind Repine, MD 07/02/23 2029

## 2023-07-02 NOTE — ED Triage Notes (Signed)
 PT arrived via EMS, PT states she was getting her hair done and hen she laid back she got some black spots and felt weak. PT is alert and talking and states she is weak all over but weak on her right leg more than usual. PT VSS besides BP was elevated per EMS which is her baseline HTN

## 2023-07-02 NOTE — ED Provider Notes (Incomplete)
 [place] [facility] Provider Note  History  Chief Complaint:  Weakness (PT arrived via EMS, PT states she was getting her hair done and hen she laid back she got some black spots and felt weak. PT is alert and talking and states she is weak all over but weak on her right leg more than usual. PT VSS besides BP was elevated per EMS which is her baseline HTN )   Weakness    [name] is a 89 y.o. female with a history of hypertension, hypothyroidism who presents the emergency department for weakness.  She reports that she was getting her hair done around 230 when she laid back and got some black spots in her vision.  She states that since then she has felt generalized more weak.  She states that then she started noticing her right lower extremity was weak.  She states that she is having pull her right lower extremity more when she is walking.  She does have elevated blood pressure with EMS.  No treatment was initiated.  Past Medical History:  Diagnosis Date   Abdominal pain 06/16/2023   Arthritis    Cerebrovascular accident (HCC) 06/16/2023   Chicken pox    Cyst of ovary 06/16/2023   Dysuria 06/16/2023   Glaucoma of both eyes    High cholesterol    Hypertension    a. Normal renal arteries by duplex 2013.   Hypothyroidism    Migraine    Other female genital prolapse 06/16/2023   PAD (peripheral artery disease) (HCC)    a. 04/2013: s/p PCI to occluded right popliteal artery    PAF (paroxysmal atrial fibrillation) (HCC)    a. s/p MAZE 2006. b. Event monitor 2013: NSR with PACs.   Pelvic mass 06/16/2023   Sinus bradycardia    a. 03/2011 during hospital admission - beta blocker stopped.   Stroke [facility])    a. Pt reports 3-4 prior strokes without residual sx. b. CVA 2013 (presented with headache) - started on Plavix . Event monitor as outpt - no AF.   Vulvovaginitis 06/16/2023    Past Surgical History:  Procedure Laterality Date   CATARACT  EXTRACTION Left 11/09/2017   LOWER EXTREMITY ANGIOGRAM N/A 05/15/2013   Procedure: LOWER EXTREMITY ANGIOGRAM;  Surgeon: Avanell Leigh, MD;  Location: [facility] CATH LAB;  Service: Cardiovascular;  Laterality: N/A;   MAZE  ~ 2006   POPLITEAL ARTERY STENT  05/15/2013    Family History  Problem Relation Age of Onset   Hypertension Mother    Ulcers Father    Cervical cancer Sister    Throat cancer Brother    Lung cancer Brother    Cancer Brother    Allergic rhinitis Neg Hx    Angioedema Neg Hx    Asthma Neg Hx    Atopy Neg Hx    Eczema Neg Hx    Immunodeficiency Neg Hx    Urticaria Neg Hx     Social History   Tobacco Use   Smoking status: Never   Smokeless tobacco: Never  Vaping Use   Vaping status: Never Used  Substance Use Topics   Alcohol use: No    Alcohol/week: 0.0 standard drinks of alcohol   Drug use: No    Review of Systems  Review of Systems  Neurological:  Positive for weakness.     Reviewed and documented in HPI if pertinent.   Physical Exam   Vitals:   07/02/23 2100 07/02/23 2230  BP: (!) 187/63 (!) 182/71  Pulse: 64 (!) 47  Resp: 10 20  Temp:    SpO2: 100% 100%       Physical Exam Vitals and nursing note reviewed.  Constitutional:      General: She is not in acute distress.    Appearance: She is well-developed.  HENT:     Head: Normocephalic and atraumatic.  Eyes:     Conjunctiva/sclera: Conjunctivae normal.     Visual Fields: Right eye visual fields normal and left eye visual fields normal.  Cardiovascular:     Rate and Rhythm: Normal rate and regular rhythm.     Heart sounds: No murmur heard. Pulmonary:     Effort: Pulmonary effort is normal. No respiratory distress.     Breath sounds: Normal breath sounds.  Abdominal:     Palpations: Abdomen is soft.     Tenderness: There is no abdominal tenderness.  Musculoskeletal:        General: No swelling.     Cervical back: Neck supple.  Skin:    General: Skin is warm and dry.      Capillary Refill: Capillary refill takes less than 2 seconds.  Neurological:     Mental Status: She is alert.  Psychiatric:        Mood and Affect: Mood normal.     Neuro Exam  Mental Status Exam Alert and oriented Memory appropriate  Speech Speech is clear No dysarthria Language is normal  Cranial Nerves CN II: Visual fields intact to confrontation CN III, IV, VI: PERRL, EOMI, No nystagmus CN V: Facial sensation is normal and equal CN VII: No facial weakness or asymmetry CN VIII: Auditory acuity grossly normal CN IX and X: Uvula is midline, palate elevates symmetrically CN XI: Normal sternocleidomastoid and equal strength CN XII: The tongue is midline, no tongue atrophy or fasciculations  Motor Muscle Strength RUE: 5/5 flexion and extension RLE: 4+/5 hip flexion and 5/5 in plantar flexion, dorsiflexion, extension LUE: 5/5 flexion and extension LLE: 5/5 flexion and extension No pronation or drift  Muscle Tone Normal bulk and tone  Coordination Intact finger-to-nose No tremor Negative Romberg  Sensation Intact to light touch  Gait Gait intact; does appear to life R leg less than L   Procedures   Procedures  ED Course - Medical Decision Making  Brief Overview [name] is a 89 y.o. female who presents as per above.  I have reviewed the nursing documentation for past medical history, family history, and social history and agree.  I have reviewed the patient's vital signs. There are no abnormalities.  Initial Differential Diagnoses: I am primarily concerned for CVA, UTI, electrolyte abnormalities, symptomatic hypertension.  Therapies: These medications and interventions were provided for the patient while in the ED.  Medications  iohexol (OMNIPAQUE) 350 MG/ML injection 75 mL (75 mLs Intravenous Contrast Given 07/02/23 2258)    Testing Results: On my interpretation labs are significant for : Baseline leukopenia and anemia Creatinine at  baseline UA without infection  On my interpretation imaging is significant for: CTA pending  EKG Interpretation Date/Time:  Friday Jul 02 2023 20:17:14 EDT Ventricular Rate:  77 PR Interval:  166 QRS Duration:  108 QT Interval:  459 QTC Calculation: 520 R Axis:   128  Text Interpretation: Sinus rhythm Atrial premature complex S1,S2,S3 pattern Abnormal R-wave progression, early transition Nonspecific repol abnormality, diffuse leads Prolonged QT interval No significant change since last tracing Confirmed by Lind Repine ([id number]) on 07/02/2023 8:25:32 PM  See the EMR for full details regarding lab and imaging results.   Medical Decision Making 89 year old female who presents the emergency department for weakness.  On exam patient does have 4+ out of 5 strength and hip flexion however has 5 out of 5 strength in plantarflexion and dorsiflexion.  She has normal sensation.  She does not have a visual field cut.  She is able to stand and her Romberg test is negative.  She does have some weakness when attempting to walk of her right lower extremity.  At this time patient does not have dense hemiparesis, signs of a basilar artery occlusion, visual field cut or aphasia therefore we have proceeded with a CTA head and neck for restratification.  The CTA images still pending.  Patient does have elevated blood pressure and symptomatic hypertension is on the differential.  However I did not want to affect patient's blood pressure in the setting of a potential CVA.  Electrolytes are within normal limits.  Baseline anemia and leukopenia on review of laboratory studies.  EKG is reassuring.  No concerns for UTI at this time.  Currently we are awaiting the CTA result.  Likely patient will require admission.  Please see oncoming providers note for further details about management.  Problems Addressed: Weakness: acute illness or injury that poses a threat to life or bodily functions  Amount and/or  Complexity of Data Reviewed Labs: ordered. Radiology: ordered.  Risk Prescription drug management.     ### All radiography studies, electrocardiograms, and laboratory data were personally reviewed by me and incorporated into my medical decision making. Impression   1. Weakness      Note: Chief Executive Officer was used in the creation of this note.     Arminda Landmark, MD 07/03/23 1610    Arminda Landmark, MD 07/03/23 [id number]    Lind Repine, MD 07/05/23 [phone]

## 2023-07-03 ENCOUNTER — Emergency Department (HOSPITAL_COMMUNITY)

## 2023-07-03 DIAGNOSIS — R29818 Other symptoms and signs involving the nervous system: Secondary | ICD-10-CM | POA: Diagnosis not present

## 2023-07-03 MED ORDER — LORAZEPAM 2 MG/ML IJ SOLN: 0.5000 mg | Freq: Once | INTRAMUSCULAR | Status: DC | PRN

## 2023-07-03 NOTE — ED Provider Notes (Signed)
 Physical Exam  BP (!) 167/67   Pulse 68   Temp 97.9 F (36.6 C) (Oral)   Resp 16   Ht 5\' 3"  (1.6 m)   Wt 56.2 kg   SpO2 100%   BMI 21.97 kg/m   Physical Exam Vitals and nursing note reviewed.  Constitutional:      Appearance: [name] is not ill-appearing or toxic-appearing.  HENT:     Head: Normocephalic and atraumatic.     Mouth/Throat:     Mouth: Mucous membranes are moist.     Pharynx: No oropharyngeal exudate or posterior oropharyngeal erythema.  Eyes:     General:        Right eye: No discharge.        Left eye: No discharge.     Conjunctiva/sclera: Conjunctivae normal.  Cardiovascular:     Rate and Rhythm: Normal rate and regular rhythm.     Pulses: Normal pulses.  Pulmonary:     Effort: Pulmonary effort is normal. No respiratory distress.     Breath sounds: Normal breath sounds. No wheezing or rales.  Abdominal:     General: Bowel sounds are normal. There is no distension.     Tenderness: There is no abdominal tenderness.  Musculoskeletal:        General: No deformity.     Cervical back: Neck supple.  Skin:    General: Skin is warm and dry.     Capillary Refill: Capillary refill takes less than 2 seconds.  Neurological:     Mental Status: [name] is alert. Mental status is at baseline.     GCS: GCS eye subscore is 4. GCS verbal subscore is 5. GCS motor subscore is 6.     Cranial Nerves: Cranial nerves 2-12 are intact.     Sensory: Sensation is intact.     Motor: Weakness present. No tremor, abnormal muscle tone or pronator drift.     Coordination: Coordination is intact.     Comments: 4/5 strength in hip flexion in R compared to 5/5 on LLE.  Patient appears unsteady when ambulating, favoring RLE subtly. Ambulates unassisted at baseline. Otherwise nonfocal neuro exam.  Psychiatric:        Mood and Affect: Mood normal.     Procedures  Procedures  ED Course / MDM    Medical Decision Making Amount and/or Complexity of Data Reviewed Labs: ordered. Radiology:  ordered.  Risk Prescription drug management.     Care of patient assumed from preceding ED provider resident physician Dr. Emory Harps.  Please see his associated note for further insight of the patient records.  In brief patient has history of popliteal stenting in the past.  Was getting [name] hair done today when laid back for shampoo had some changes in [name] vision with subsequent right leg weakness and difficulty ambulating which is new for [name].  Very subtle weakness in hip flexion in the right lower extremity per preceding ED provider.  Otherwise nonfocal neuroexam.  At time of shift change patient pending CTA head and neck, plan for admission for completion of workup.   CTA without acute finding to explain symptoms today. Patient expressed strong preference to avoid admission to the hospital. Discussed MR in the ED to evaluate for CVA, followed by repeat discussion regarding dispo pending MR results. Patient amenable to this plan.   MRI negative for acute finding. Shared decision making with the patient regarding disposition.  Patient with strong preference to be discharged home.  Does live with [name] Farmer who  is at the bedside.  They have a walker at home and [name] feels like this will be a safe option for [name] given [name] is more unstable with [name] ambulation than is typical at [name] baseline.  Will make referral for home health and recommend close outpatient follow-up with [name] PCP.  Clinical concern for emergent underlying condition at this time that would warrant for the workup and patient management is exceedingly low.  [name] voiced understanding of [name] medical evaluation and treatment plan. Each of their questions answered to their expressed satisfaction.  Return precautions were given.  Patient is well-appearing, stable, and was discharged in good condition.  This chart was dictated using voice recognition software, Dragon. Despite the best efforts of this provider to proofread and correct  errors, errors may still occur which can change documentation meaning.      [name], PA-C 07/03/23 Buford Carnes, MD 07/03/23 [phone]

## 2023-07-03 NOTE — ED Notes (Signed)
Pt used bedside commode.

## 2023-07-03 NOTE — Discharge Instructions (Addendum)
 You were seen in the ER today For your weakness and transient vision change earlier in the day.  Your blood work, EKG and imaging are reassuring.  There is no signs of stroke today.  While the exact cause of your weakness remains unclear there does not appear to be any emergency at this time.  You may follow-up with your primary care doctor.  Please use a walker at home while walking until you are feeling more stable.  Follow-up with your primary care doctor in the next week and return to the ER with any severe symptoms.  Please continue your previously prescribed medications including your Eliquis .

## 2023-07-05 NOTE — Telephone Encounter (Signed)
 Returned call to pt and reviewed updated recommendations, pt verbalizes understanding.

## 2023-07-05 NOTE — Telephone Encounter (Signed)
 Discussed episodes of palpitations with Dr. Abel Hoe. He agreed with plan to stay well hydrated, limit caffeine to prevent palpitations. Continue plan for Metoprolol  Tartrate 12.5mg  (half tablet) for heart rate >110 bpm and if heart rate persistently >110bpm after 30 minutes to take another 12.5mg  (half tablet).  Amanda Gains S Khang Hannum, NP

## 2023-07-05 NOTE — Telephone Encounter (Signed)
Results called to patient who verbalizes understanding!   

## 2023-07-07 ENCOUNTER — Telehealth: Payer: Self-pay | Admitting: Cardiovascular Disease

## 2023-07-07 NOTE — Telephone Encounter (Signed)
 Spoke with pt son, he reports the patient is having more frequent episodes of atrial fib and is having to use the metoprolol  more frequently. He reports when she is in a fib her heart rate is high and she feels very weak. The metoprolol  is currently as needed. Aware will forward for dr mcalhany's review.

## 2023-07-07 NOTE — Telephone Encounter (Signed)
 Son [name]) called to follow-up on recommendations from Dr. Abel Hoe regarding patient's Afib incidents.

## 2023-07-08 NOTE — Telephone Encounter (Signed)
 Thank you :)

## 2023-07-08 NOTE — Telephone Encounter (Signed)
 Called and spoke w patient.  Adv per Dr. Abel Hoe we will make her an appointment at afib clinic.  She is available whenever but her son will be out of town until Tuesday and wont be able to bring her.  I scheduled the patient 07/16/23 at afib clinic at 9:30 am.  Pt is in agreement with this day/time but asked me to call her son to fill him in with this information.  Called [name], [name]) and left a message for him to call back so that information can be relayed to him.

## 2023-07-08 NOTE — Telephone Encounter (Signed)
 Son called back to say that would be fine. Please advise

## 2023-07-15 ENCOUNTER — Other Ambulatory Visit: Payer: Self-pay | Admitting: Family Medicine

## 2023-07-16 ENCOUNTER — Ambulatory Visit (HOSPITAL_COMMUNITY)
Admission: RE | Admit: 2023-07-16 | Discharge: 2023-07-16 | Disposition: A | Discharge status: Home / Self Care | Source: Ambulatory Visit | Attending: Internal Medicine | Admitting: Internal Medicine

## 2023-07-16 ENCOUNTER — Encounter (HOSPITAL_COMMUNITY): Payer: Self-pay | Admitting: Internal Medicine

## 2023-07-16 VITALS — BP 148/72 | HR 54 | Ht 63.0 in | Wt 119.6 lb

## 2023-07-16 DIAGNOSIS — D6859 Other primary thrombophilia: Secondary | ICD-10-CM

## 2023-07-16 DIAGNOSIS — I480 Paroxysmal atrial fibrillation: Secondary | ICD-10-CM

## 2023-07-16 MED ORDER — METOPROLOL SUCCINATE ER 25 MG PO TB24: 12.5000 mg | ORAL_TABLET | Freq: Every day | ORAL | 3 refills | Status: DC

## 2023-07-16 NOTE — Patient Instructions (Addendum)
 Start metoprolol  succinate (toprol ) 1/2 tablet once a day at bedtime (12.5mg  daily)  Call with update of heart rates [phone] (option 2) Radio producer

## 2023-07-16 NOTE — Progress Notes (Signed)
 Primary Care Physician: Almira Jaeger, MD Primary Cardiologist: Antoinette Batman, MD Electrophysiologist: None     Referring Physician: Dr. Gwynne Less is a 89 y.o. female with a history of HTN, HLD, CAD, CVA, PAD, CKD3b, and atrial fibrillation who presents for consultation in the [facility]. Seen by Cardiology on 5/8 noting increasing Afib burden and having to take metoprolol  PRN more often. Patient is on Eliquis  2.5 mg BID for a CHADS2VASC score of 6.  On evaluation today, she is currently in NSR. She is here with son today and notes to have Afib episodes about once weekly. She has been using metoprolol  PRN but would like to know other options for treatment. She feels weak and unwell when in Afib.    Today, she denies symptoms of palpitations, chest pain, shortness of breath, orthopnea, PND, lower extremity edema, dizziness, presyncope, syncope, snoring, daytime somnolence, bleeding, or neurologic sequela. The patient is tolerating medications without difficulties and is otherwise without complaint today.    she has a BMI of Body mass index is 21.19 kg/m.Aaron Aas Filed Weights   07/16/23 0927  Weight: 54.3 kg    Current Outpatient Medications  Medication Sig Dispense Refill   apixaban  (ELIQUIS ) 2.5 MG TABS tablet TAKE 1 TABLET(2.5 MG) BY MOUTH TWICE DAILY 180 tablet 3   ARMOUR THYROID  15 MG tablet TAKE 1 TABLET BY MOUTH EVERY DAY 90 tablet 1   chlorthalidone  (HYGROTON ) 25 MG tablet Take 1 tablet (25 mg total) by mouth daily. 90 tablet 3   Digestive Enzyme CAPS Take 1 capsule by mouth See admin instructions. Mix the contents of 1 capsule into water and drink with all meals     dorzolamide-timolol (COSOPT) 22.3-6.8 MG/ML ophthalmic solution Place 1 drop into the left eye in the morning and at bedtime.      doxazosin  (CARDURA ) 2 MG tablet Take 1.5 tablets (3 mg total) by mouth at bedtime. 135 tablet 3   hydrALAZINE  (APRESOLINE ) 25 MG  tablet TAKE 1 AND 1/2 TABLETS(37.5 MG) BY MOUTH THREE TIMES DAILY 150 tablet 3   loperamide  (IMODIUM  A-D) 2 MG tablet Take 1 tablet (2 mg total) by mouth 4 (four) times daily as needed for diarrhea or loose stools. 20 tablet 0   metoprolol  succinate (TOPROL  XL) 25 MG 24 hr tablet Take 0.5 tablets (12.5 mg total) by mouth at bedtime. 15 tablet 3   metoprolol  tartrate (LOPRESSOR ) 25 MG tablet Take 0.5 tablets (12.5 mg total) by mouth daily as needed (HR >110 take only BID). 45 tablet 0   Multiple Vitamin (MULTIVITAMIN) LIQD Take 30 mLs by mouth daily.     Nutritional Supplements (FEEDING SUPPLEMENT, KATE FARMS STANDARD 1.4,) LIQD liquid Take 325 mLs by mouth 3 (three) times daily between meals.     Papaya TABS Take 3 tablets by mouth 3 (three) times daily.      Probiotic CAPS Take 1 capsule by mouth daily.     triamcinolone  (NASACORT ) 55 MCG/ACT AERO nasal inhaler Place 1 spray into the nose daily. Start with 1 spray each side twice a day for 2 days, then reduce to daily to control throat mucus, then reduce to qod if symptom improved through allergy season. 1 each 2   No current facility-administered medications for this encounter.    Atrial Fibrillation Management history:  Previous antiarrhythmic drugs: none Previous cardioversions: none Previous ablations: none Anticoagulation history: Eliquis    ROS- All systems are reviewed and negative except  as per the HPI above.  Physical Exam: BP (!) 148/72   Pulse (!) 54   Ht 5\' 3"  (1.6 m)   Wt 54.3 kg   BMI 21.19 kg/m   GEN: Well nourished, well developed in no acute distress NECK: No JVD; No carotid bruits CARDIAC: Regular bradycardic rate and rhythm, no murmurs, rubs, gallops RESPIRATORY:  Clear to auscultation without rales, wheezing or rhonchi  ABDOMEN: Soft, non-tender, non-distended EXTREMITIES:  No edema; No deformity   EKG today demonstrates  Vent. rate 54 BPM PR interval 182 ms QRS duration 120 ms QT/QTcB 458/434 ms P-R-T  axes 23 5 -65 Sinus bradycardia Right bundle branch block Minimal voltage criteria for LVH, may be normal variant ( R in aVL ) T wave abnormality, consider inferior ischemia Abnormal ECG   Echo 2015 showed normal LV function   ASSESSMENT & PLAN CHA2DS2-VASc Score = 7  The patient's score is based upon: CHF History: 0 HTN History: 1 Diabetes History: 0 Stroke History: 2 Vascular Disease History: 1 Age Score: 2 Gender Score: 1       ASSESSMENT AND PLAN: Paroxysmal Atrial Fibrillation (ICD10:  I48.0) The patient's CHA2DS2-VASc score is 7, indicating a 11.2% annual risk of stroke.    She is currently in NSR. We discussed rate vs rhythm control options. It overall appears as though may be challenging to offer treatment considering she has sinus bradycardia at baseline. We discussed maintenance therapy of metoprolol  (noted bradycardia with this in allergy list) versus beginning an AAD such as Multaq or amiodarone. We discussed the potential adverse effects of these medications. We discussed that any choice may be prohibitive in terms of her naturally lower HR. Any choice today would require cautious monitoring of HR after medication start to ensure her heart rate does not have a significant decrease. After discussion with patient and son, they have decided to not begin an AAD and instead trial low dose Toprol . Will begin Toprol  12.5 mg at bedtime and monitor HR. Contact office in 2 weeks with HR recordings.   Secondary Hypercoagulable State (ICD10:  D68.69) The patient is at significant risk for stroke/thromboembolism based upon her CHA2DS2-VASc Score of 7.  Continue Apixaban  (Eliquis ).  Continue Eliquis  without interruption.   Follow up as scheduled with Dr. Theodis Fiscal.    Minnie Amber, PA-C  [facility] [address] [place], [place] [address] [phone]

## 2023-07-21 ENCOUNTER — Ambulatory Visit: Payer: Self-pay | Admitting: Family Medicine

## 2023-07-21 NOTE — Telephone Encounter (Signed)
 Patient was prescribed metoprolol  succinate 12.5 mg to take at bedtime by her cardiologist. Pt wants to check with her PCP that this medication will not interfere with any of her current medications. This RN contacted [facility] and was told to send a CRM to clinic. Patient call back number is [phone].   Copied from CRM [id number]. Topic: Clinical - Red Word Triage >> Jul 21, 2023  4:44 PM Chelcy Bolda wrote: Red Word that prompted transfer to Nurse Triage: Pt was prescribed Metoprolol  from her heart doctor today for her AFIB but wants to make sure this is not going to interact with her other medications before she takes it.  She was directed to speak with provider first, but if approved to begin tonight. Reason for Disposition  [1] Caller requesting NON-URGENT health information AND [2] PCP's office is the best resource  Answer Assessment - Initial Assessment Questions 1. REASON FOR CALL or QUESTION: "What is your reason for calling today?" or "How can I best help you?" or "What question do you have that I can help answer?"     Medication question  Protocols used: Information Only Call - No Triage-A-AH

## 2023-07-22 NOTE — Telephone Encounter (Signed)
 Safe to trial metoprolol 

## 2023-07-22 NOTE — Telephone Encounter (Signed)
 See below

## 2023-07-23 NOTE — Telephone Encounter (Signed)
 Called and spoke w/pt advised of PCP remarks and recommendations. Pt verbalized understanding and no further questions at this time.

## 2023-07-23 NOTE — Telephone Encounter (Signed)
 The metoprolol  extended release is likely to help control her heart rate from getting too high from a fib. Cardiology wants to monitor as she has had slow heart rate in past though. She also has short acting metoprolol  she can take if heart rate over 110 on top of the extended release

## 2023-07-23 NOTE — Telephone Encounter (Signed)
 Called and spoke with pt and below message given. Pt wants to know what to do if she goes into afib while taking it?

## 2023-07-30 ENCOUNTER — Other Ambulatory Visit (HOSPITAL_COMMUNITY): Payer: Self-pay

## 2023-08-03 ENCOUNTER — Ambulatory Visit (INDEPENDENT_AMBULATORY_CARE_PROVIDER_SITE_OTHER): Payer: Medicare Other | Admitting: Family Medicine

## 2023-08-03 ENCOUNTER — Encounter: Payer: Self-pay | Admitting: Family Medicine

## 2023-08-03 VITALS — BP 146/60 | HR 58 | Temp 97.3°F | Ht 63.0 in | Wt 121.2 lb

## 2023-08-03 DIAGNOSIS — R7989 Other specified abnormal findings of blood chemistry: Secondary | ICD-10-CM | POA: Diagnosis not present

## 2023-08-03 DIAGNOSIS — Z131 Encounter for screening for diabetes mellitus: Secondary | ICD-10-CM

## 2023-08-03 DIAGNOSIS — E559 Vitamin D deficiency, unspecified: Secondary | ICD-10-CM

## 2023-08-03 DIAGNOSIS — R739 Hyperglycemia, unspecified: Secondary | ICD-10-CM

## 2023-08-03 DIAGNOSIS — E7800 Pure hypercholesterolemia, unspecified: Secondary | ICD-10-CM | POA: Diagnosis not present

## 2023-08-03 DIAGNOSIS — E039 Hypothyroidism, unspecified: Secondary | ICD-10-CM

## 2023-08-03 NOTE — Progress Notes (Signed)
 Phone [phone] In person visit   Subjective:   [name] is a 89 y.o. year old very pleasant female patient who presents for/with See problem oriented charting Chief Complaint  Patient presents with   Medical Management of Chronic Issues   Hypertension   Hyperlipidemia   Past Medical History-  Patient Active Problem List   Diagnosis Date Noted   Atrial fibrillation (HCC) 06/16/2023    Priority: High   COVID-19 10/12/2019    Priority: High   CKD (chronic kidney disease), stage IV (HCC) 06/15/2018    Priority: High   Chronic cough 08/12/2015    Priority: High   PAD (peripheral artery disease) (HCC) 05/10/2013    Priority: High   History of stroke     Priority: High   Hypertensive disorder 06/16/2023    Priority: Medium    Carotid atherosclerosis 05/01/2021    Priority: Medium    Low vitamin B12 level 12/09/2017    Priority: Medium    Hyperglycemia 06/25/2014    Priority: Medium    Hypothyroidism 06/07/2014    Priority: Medium    Hypercholesterolemia 06/07/2014    Priority: Medium    GERD (gastroesophageal reflux disease) 06/25/2014    Priority: Low   Vitamin D  deficiency 06/25/2014    Priority: Low   Intracervical pessary 06/07/2014    Priority: Low   Sinus bradycardia 05/19/2013    Priority: Low   PAC (premature atrial contraction) 05/20/2011    Priority: Low   Diarrhea 02/13/2021   Anemia of chronic disease 12/14/2019   Anemia 08/16/2017   Perennial allergic rhinitis 08/12/2015    Medications- reviewed and updated Current Outpatient Medications  Medication Sig Dispense Refill   apixaban  (ELIQUIS ) 2.5 MG TABS tablet TAKE 1 TABLET(2.5 MG) BY MOUTH TWICE DAILY 180 tablet 3   ARMOUR THYROID  15 MG tablet TAKE 1 TABLET BY MOUTH EVERY DAY 90 tablet 1   chlorthalidone  (HYGROTON ) 25 MG tablet Take 1 tablet (25 mg total) by mouth daily. 90 tablet 3   Digestive Enzyme CAPS Take 1 capsule by mouth See admin instructions. Mix the contents of 1 capsule into  water and drink with all meals     dorzolamide-timolol (COSOPT) 22.3-6.8 MG/ML ophthalmic solution Place 1 drop into the left eye in the morning and at bedtime.      doxazosin  (CARDURA ) 2 MG tablet Take 1.5 tablets (3 mg total) by mouth at bedtime. 135 tablet 3   hydrALAZINE  (APRESOLINE ) 25 MG tablet TAKE 1 AND 1/2 TABLETS(37.5 MG) BY MOUTH THREE TIMES DAILY 150 tablet 3   loperamide  (IMODIUM  A-D) 2 MG tablet Take 1 tablet (2 mg total) by mouth 4 (four) times daily as needed for diarrhea or loose stools. 20 tablet 0   metoprolol  tartrate (LOPRESSOR ) 25 MG tablet Take 0.5 tablets (12.5 mg total) by mouth daily as needed (HR >110 take only BID). 45 tablet 0   Multiple Vitamin (MULTIVITAMIN) LIQD Take 30 mLs by mouth daily.     Nutritional Supplements (FEEDING SUPPLEMENT, KATE FARMS STANDARD 1.4,) LIQD liquid Take 325 mLs by mouth 3 (three) times daily between meals.     Papaya TABS Take 3 tablets by mouth 3 (three) times daily.      Probiotic CAPS Take 1 capsule by mouth daily.     triamcinolone  (NASACORT ) 55 MCG/ACT AERO nasal inhaler Place 1 spray into the nose daily. Start with 1 spray each side twice a day for 2 days, then reduce to daily to control throat mucus, then reduce  to qod if symptom improved through allergy season. 1 each 2   No current facility-administered medications for this visit.     Objective:  BP (!) 146/60   Pulse (!) 58   Temp (!) 97.3 F (36.3 C)   Ht 5\' 3"  (1.6 m)   Wt 121 lb 3.2 oz (55 kg)   SpO2 97%   BMI 21.47 kg/m  Gen: NAD, resting comfortably CV: RRR with occasoinal ectopic beat (not clearly in a fib but coul dbe- also wonder about PVC's) no murmurs rubs or gallops Lungs: CTAB no crackles, wheeze, rhonchi Ext: trace edema under zippered compression Skin: warm, dry Neuro: grossly normal, moves all extremities     Assessment and Plan   # Right Leg weakness-patient was getting her hair done on 07/02/2023 when she laid back for shampoo and had some changes  in her vision was subsequently noted right leg weakness and difficulty ambulating-on exam very subtle weakness in hip flexion-CTA head and neck reassuring MRI of the brain reassuring and was discharged home. 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  A/P: appropriately anticoagulated and rate controlled- continue current medicine    #hypertension #CKD lV - sees Dr. Jearldine Mina- most recently CKD III S: medication: chlorthalidone  25 mg, doxazosin  2 mg in morning and  mg, hydralazine  25 mg 2 times a day- 1.5 in morning and 1.5 in the evening mainly- ideally would be 3x a day -STOPPED irbesartan  300 mg 12/2021 concerned about as cause of cough Home readings #s: around 150 or less at home- occasionally higher BP Readings from Last 3 Encounters:  08/03/23 (!) 146/60  07/16/23 (!) 148/72  07/03/23 (!) 178/90  A/P: blood pressure above ideal goal on one hand but we want to be really cautious at her age and not make her lightheaded- we are going to see if we can gently adjust and have her take the 3 mg doxazosin  at night -still has cough for last 2 months- comes and goes- has attributed to medicine in past. Also staw Candace Cerise, NP in April and lungs were clear on CXR   #hypothyroidism S: compliant On thyroid  medication-  armour thyroid  15 mg  Lab Results  Component Value Date   TSH 3.47 05/26/2022  A/P:hopefully stable- update TSH  today. Continue current meds for now    #anemia and leukopenia- anemia of chronic disease per oncology 12/16/19 with only Primary Care Provider (PCP) follow up   Recommended follow up: Return in about 4 months (around 12/03/2023) for followup or sooner if needed.Schedule b4 you leave. Future Appointments  Date Time Provider Department Center  09/14/2023  1:30 PM Maudine Sos, MD [facility]  10/18/2023  1:00 PM Arma Lamp, MD [facility]    Lab/Order associations: banana only   ICD-10-CM  1. Hypercholesterolemia  E78.00 Comprehensive metabolic panel with GFR    CBC with Differential/Platelet    Lipid panel    2. Hypothyroidism, unspecified type  E03.9 TSH    3. Low vitamin B12 level  R79.89 Vitamin B12    4. Vitamin D  deficiency  E55.9 VITAMIN D  25 Hydroxy (Vit-D Deficiency, Fractures)    5. Screening for diabetes mellitus  Z13.1 Hemoglobin A1c    6. Hyperglycemia  R73.9 Hemoglobin A1c      No orders of the defined types were placed in this encounter.   Return precautions advised.  Clarisa Crooked, MD

## 2023-08-03 NOTE — Patient Instructions (Addendum)
 Doxazosin  2 mg  was listed as taking 1.5 tablets in evening- lets try that instead of the 1 tablet in evening you are currently taking  Please stop by lab before you go If you have mychart- we will send your results within 3 business days of us  receiving them.  If you do not have mychart- we will call you about results within 5 business days of us  receiving them.  *please also note that you will see labs on mychart as soon as they post. I will later go in and write notes on them- will say "notes from Dr. Arlene Ben"   Recommended follow up: Return in about 4 months (around 12/03/2023) for followup or sooner if needed.Schedule b4 you leave.

## 2023-08-04 ENCOUNTER — Telehealth (HOSPITAL_COMMUNITY): Payer: Self-pay

## 2023-08-04 NOTE — Telephone Encounter (Signed)
 Patient called in last Friday-6/6 to let us  know she has been having slow heart rates. Ranging in the 40's. She has been feeling more tired. Per Ross Coombs advise patient to stop Metoprolol . Consulted with patient on Friday 6/6 and she verbalized understanding.

## 2023-08-06 ENCOUNTER — Ambulatory Visit: Payer: Self-pay | Admitting: Family Medicine

## 2023-08-17 ENCOUNTER — Telehealth: Payer: Self-pay

## 2023-08-17 NOTE — Telephone Encounter (Signed)
 Copied from CRM [id number]. Topic: General - Call Back - No Documentation >> Aug 16, 2023  8:47 AM Marissa P wrote: Reason for CRM: Patient called back stating she has some missed calls. Looked thru notes not showing any notes or missed calls, read to her the lab results as she stated no one has told her  and is requesting a nurse to call her back please

## 2023-08-19 DIAGNOSIS — H401132 Primary open-angle glaucoma, bilateral, moderate stage: Secondary | ICD-10-CM | POA: Diagnosis not present

## 2023-08-20 ENCOUNTER — Other Ambulatory Visit: Payer: Self-pay | Admitting: Family Medicine

## 2023-09-13 ENCOUNTER — Encounter: Payer: Self-pay | Admitting: *Deleted

## 2023-09-14 ENCOUNTER — Encounter (HOSPITAL_BASED_OUTPATIENT_CLINIC_OR_DEPARTMENT_OTHER): Payer: Self-pay | Admitting: Cardiovascular Disease

## 2023-09-14 ENCOUNTER — Encounter (HOSPITAL_BASED_OUTPATIENT_CLINIC_OR_DEPARTMENT_OTHER): Payer: Self-pay | Admitting: *Deleted

## 2023-09-14 ENCOUNTER — Ambulatory Visit (HOSPITAL_BASED_OUTPATIENT_CLINIC_OR_DEPARTMENT_OTHER): Admitting: Cardiovascular Disease

## 2023-09-14 VITALS — BP 166/52 | HR 50 | Ht 63.0 in | Wt 117.0 lb

## 2023-09-14 DIAGNOSIS — I739 Peripheral vascular disease, unspecified: Secondary | ICD-10-CM | POA: Diagnosis not present

## 2023-09-14 DIAGNOSIS — I491 Atrial premature depolarization: Secondary | ICD-10-CM | POA: Diagnosis not present

## 2023-09-14 DIAGNOSIS — I4891 Unspecified atrial fibrillation: Secondary | ICD-10-CM | POA: Diagnosis not present

## 2023-09-14 DIAGNOSIS — I10 Essential (primary) hypertension: Secondary | ICD-10-CM

## 2023-09-14 DIAGNOSIS — I6523 Occlusion and stenosis of bilateral carotid arteries: Secondary | ICD-10-CM

## 2023-09-14 DIAGNOSIS — R001 Bradycardia, unspecified: Secondary | ICD-10-CM

## 2023-09-14 NOTE — Progress Notes (Signed)
 Hypertension Clinic Follow Up:    Date:  09/14/2023   ID:  [name], DOB 1925/12/11, MRN [id number]  PCP:  Amanda Garnette KIDD, MD  Cardiologist:  Amanda Cash, MD   Referring MD: Amanda Garnette KIDD, MD   CC: Hypertension  History of Present Illness:    [name] is a 89 y.o. female with a hx of paroxysmal atrial fibrillation, hypertension, hyperlipidemia, prior stroke, and CKD 3b here for follow up.  She first established care in the hypertension clinic 04/2020.  She was first diagnosed with hypertension in her 83s.  Initially it was more easy to control but has been more difficult lately.  When she checks her blood pressure at home it is mostly in the 120s to 170s.  On average it is in the 150s or higher.  She last saw Dr. Katrinka 02/2020.  At that time her blood pressure was 140/56, down 154/60 for the prior visit.  At the time she was taking irbesartan  300 mg, hydralazine  25 mg 3 times daily and chlorthalidone  25 mg daily.  She is metoprolol  as needed for elevated heart rates and atrial fibrillation.  She last needed to use this in November.  She had some dizziness when hydralazine  was increased to 50 mg.  Lately she has been under a lot of stress.  Her husband of 70 years passed away in Oct 08, 2022from COVID-19.  They were both hospitalized at the same time.    [name] saw cardiology in the hospital in 2013 for bradycardia.  At that time her blood pressure was poorly controlled and she had recently been started on Bystolic but her heart rate dropped to the 30s and 40s.  It was recommended that Bystolic be discontinued.  She had renal artery Dopplers in 2013 that were normal.  At her initial visit her blood pressure was 198/78.  Renal Dopplers revealed normal blood flow bilaterally.  We recommended grief counseling and increasing her exercise.  Doxazosin  was added to her regimen.  She followed up with [name] and most of her home readings were well-controlled.   She noted some diarrhea that she attributed to the doxazosin .  At her visit 04/2023 she reported constant cough which she attributed to one of her blood pressure medications.  Blood pressure readings at home are variable.  Her blood pressure in the office was 171/61.  It was thought her chronic cough may be due to acid reflux and she was given a prescription for pantoprazole .  Doxazosin  was increased.  She followed up with [name] and had stopped the pantoprazole  due to inefficacy.  Blood pressure remained uncontrolled.  She saw Amanda Finder, NP 06/2023 and blood pressure was ranging in the 130s to 150s at home.  Doxazosin  was increased to 3 mg.  She saw the A-fib clinic 06/2023 due to weekly episodes of atrial fibrillation.  She was using metoprolol  as needed.  They decided to start her taking it every day.  Discussed the use of AI scribe software for clinical note transcription with the patient, who gave verbal consent to proceed.  History of Present Illness [name] has experienced a decrease in the frequency of her atrial fibrillation episodes, with no occurrences in the past two weeks, compared to at least once a week previously. Metoprolol  was initially prescribed for daily use but was discontinued due to bradycardia. She now uses short-acting metoprolol  as needed, particularly when dining out. She is on Eliquis  for atrial fibrillation.  She experienced a persistent cough for two and a half months, which resolved about a week ago. The cough was severe enough to cause significant fatigue and weight loss, with her weight now at 117 pounds, down from 120 pounds. An acid reflux medication was tried but did not alleviate the cough.  Her blood pressure has been fluctuating significantly. She monitors it daily and adjusts her medication accordingly. She takes chlorthalidone , hydralazine , and doxazosin  regularly, with an extra half dose of doxazosin  at night if her blood pressure exceeds 150/60  mmHg.  She experiences some swelling in her ankles, which resolves overnight when she elevates her legs.  Previous antihypertensives: nebivolol-bradycardia HCTZ-dysuria Amlodipine - edema Hydralazine -dizziness Clonidine -bradycardia Chlorthalidone -cough Irbesartan -cough    Past Medical History:  Diagnosis Date   Abdominal pain 06/16/2023   Arthritis    Cerebrovascular accident (HCC) 06/16/2023   Chicken pox    Cyst of ovary 06/16/2023   Dysuria 06/16/2023   Glaucoma of both eyes    High cholesterol    Hypertension    a. Normal renal arteries by duplex 2013.   Hypothyroidism    Migraine    Other female genital prolapse 06/16/2023   PAD (peripheral artery disease) (HCC)    a. 04/2013: s/p PCI to occluded right popliteal artery    PAF (paroxysmal atrial fibrillation) (HCC)    a. s/p MAZE 2006. b. Event monitor 2013: NSR with PACs.   Pelvic mass 06/16/2023   Sinus bradycardia    a. 03/2011 during hospital admission - beta blocker stopped.   Stroke [name])    a. Pt reports 3-4 prior strokes without residual sx. b. CVA 2013 (presented with headache) - started on Plavix . Event monitor as outpt - no AF.   Vulvovaginitis 06/16/2023    Past Surgical History:  Procedure Laterality Date   CATARACT EXTRACTION Left 11/09/2017   LOWER EXTREMITY ANGIOGRAM N/A 05/15/2013   Procedure: LOWER EXTREMITY ANGIOGRAM;  Surgeon: Dorn JINNY Lesches, MD;  Location: [facility] CATH LAB;  Service: Cardiovascular;  Laterality: N/A;   MAZE  ~ 2006   POPLITEAL ARTERY STENT  05/15/2013    Current Medications: Current Meds  Medication Sig   apixaban  (ELIQUIS ) 2.5 MG TABS tablet TAKE 1 TABLET(2.5 MG) BY MOUTH TWICE DAILY   chlorthalidone  (HYGROTON ) 25 MG tablet Take 1 tablet (25 mg total) by mouth daily.   dorzolamide-timolol (COSOPT) 22.3-6.8 MG/ML ophthalmic solution Place 1 drop into the left eye in the morning and at bedtime.    doxazosin  (CARDURA ) 2 MG tablet Take 1.5 tablets (3 mg total) by mouth at bedtime.    hydrALAZINE  (APRESOLINE ) 25 MG tablet TAKE 1 AND 1/2 TABLETS(37.5 MG) BY MOUTH THREE TIMES DAILY   metoprolol  tartrate (LOPRESSOR ) 25 MG tablet Take 0.5 tablets (12.5 mg total) by mouth daily as needed (HR >110 take only BID).   Multiple Vitamin (MULTIVITAMIN) LIQD Take 30 mLs by mouth daily.   Papaya TABS Take 3 tablets by mouth 3 (three) times daily.    Probiotic CAPS Take 1 capsule by mouth daily.   thyroid  (ARMOUR THYROID ) 15 MG tablet TAKE 1 TABLET BY MOUTH EVERY DAY     Allergies:   Aceon [perindopril erbumine], Amlodipine , Aspirin, Azor [amlodipine -olmesartan ], Beta adrenergic blockers, Hydralazine , Milk-related compounds, Other, Peanut-containing drug products, Sulfamethoxazole-trimethoprim, and Vitamin d  analogs   Social History   Socioeconomic History   Marital status: Widowed    Spouse name: Not on file   Number of children: Not on file   Years of education: Not on file  Highest education level: Not on file  Occupational History   Not on file  Tobacco Use   Smoking status: Never   Smokeless tobacco: Never   Tobacco comments:    Never smoked 07/16/23  Vaping Use   Vaping status: Never Used  Substance and Sexual Activity   Alcohol use: No    Alcohol/week: 0.0 standard drinks of alcohol   Drug use: No   Sexual activity: Never  Other Topics Concern   Not on file  Social History Narrative   Widowed. Lost husband to COVID-19 in September 2020. Married 65 years in 2016. [place] 22 years in 2016. 4 children-3 boys, youngest girl chiropractor in [place] . 7 grandkids. 6 greatgrandkids.    Finished 10th grade.       Retired from working in [employer] for [employer]         Social Drivers of [employer]: Low Risk  (05/10/2020)   Overall Financial Resource Strain (CARDIA)    Difficulty of Paying Living Expenses: Not hard at all  Food Insecurity: No Food Insecurity (05/10/2020)   Hunger Vital Sign    Worried About Running Out of Food in the Last  Year: Never true    Ran Out of Food in the Last Year: Never true  Transportation Needs: No Transportation Needs (05/10/2020)   PRAPARE - Administrator, Civil Service (Medical): No    Lack of Transportation (Non-Medical): No  Physical Activity: Not on file  Stress: Stress Concern Present (05/10/2020)   [employer] of Occupational Health - Occupational Stress Questionnaire    Feeling of Stress : To some extent  Social Connections: Not on file     Family History: The patient's family history includes Cancer in her brother; Cervical cancer in her sister; Hypertension in her mother; Lung cancer in her brother; Throat cancer in her brother; Ulcers in her father. There is no history of Allergic rhinitis, Angioedema, Asthma, Atopy, Eczema, Immunodeficiency, or Urticaria.  ROS:   Please see the history of present illness.     All other systems reviewed and are negative.  EKGs/Labs/Other Studies Reviewed:    EKG:  EKG is not ordered today.    Recent Labs: 07/01/2023: Magnesium 2.3 08/03/2023: ALT 10; BUN 29; Creatinine, Ser 1.35; Hemoglobin 10.1; Platelets 161.0; Potassium 3.8; Sodium 131; TSH 4.05   Recent Lipid Panel    Component Value Date/Time   CHOL 199 08/03/2023 1110   TRIG 63.0 08/03/2023 1110   HDL 65.50 08/03/2023 1110   CHOLHDL 3 08/03/2023 1110   VLDL 12.6 08/03/2023 1110   LDLCALC 121 (H) 08/03/2023 1110   LDLCALC 133 (H) 12/06/2019 1500    Physical Exam:    VS:  BP (!) 166/52   Pulse (!) 50   Ht 5' 3 (1.6 m)   Wt 117 lb (53.1 kg)   SpO2 99%   BMI 20.73 kg/m  , BMI Body mass index is 20.73 kg/m. GENERAL:  Well appearing HEENT: Pupils equal round and reactive, fundi not visualized, oral mucosa unremarkable NECK:  No jugular venous distention, waveform within normal limits, carotid upstroke brisk and symmetric, no bruits, no thyromegaly LUNGS:  Clear to auscultation bilaterally HEART:  RRR.  PMI not displaced or sustained,S1 and S2 within normal  limits, no S3, no S4, no clicks, no rubs, no murmurs ABD:  Flat, positive bowel sounds normal in frequency in pitch, no bruits, no rebound, no guarding, no midline pulsatile mass, no hepatomegaly, no splenomegaly EXT:  2 plus pulses throughout, trace edema, no cyanosis no clubbing SKIN:  No rashes no nodules NEURO:  Cranial nerves II through XII grossly intact, motor grossly intact throughout PSYCH:  Cognitively intact, oriented to person place and time  ASSESSMENT:    1. Atrial fibrillation, unspecified type (HCC)   2. PAC (premature atrial contraction)   3. Primary hypertension   4. PAD (peripheral artery disease) (HCC)   5. Sinus bradycardia   6. Atherosclerosis of both carotid arteries     PLAN:    Assessment & Plan # Paroxysmal Atrial Fibrillation Episodes decreased, no recent episodes. Metoprolol  discontinued due to bradycardia. Ablation and antiarrhythmics not recommended due to age and side effects. Eliquis  continued for stroke prevention. She prefers non-aggressive management.  Fortunately she is doing better now. - Continue Eliquis  for stroke prevention. - Use metoprolol  as needed for atrial fibrillation episodes.  Avoid daily use due to bradycardia.  # Hypertension Blood pressure variable. Current regimen includes chlorthalidone , doxazosin , and hydralazine . Additional doxazosin  taken if BP >150/60.  She takes nitro 1 mg in the situations.  Management limited by diastolic hypotension. Daily monitoring guides medication timing.  Her pulse pressures while at home are any murmurs on exam and shows no heart failure symptoms.  Low suspicion of severe aortic regurgitation. - Continue current regimen of chlorthalidone , doxazosin , and hydralazine . - Take an extra half dose of doxazosin  at night if blood pressure exceeds 150/60. - Reassess blood pressure management in six months.   Dispo: Fu in 6 months  Medication Adjustments/Labs and Tests Ordered: Current medicines are reviewed  at length with the patient today.  Concerns regarding medicines are outlined above.  No orders of the defined types were placed in this encounter.  No orders of the defined types were placed in this encounter.    Signed, Annabella Scarce, MD  09/14/2023 4:17 PM    [facility]

## 2023-09-14 NOTE — Patient Instructions (Signed)
 Medication Instructions:  Your physician recommends that you continue on your current medications as directed. Please refer to the Current Medication list given to you today.   Labwork: NONE   Testing/Procedures: NONE  Follow-Up: 6 MONTHS WITH DR Ingram , CAITLIN W NP, OR MICHELLE S NP   If you need a refill on your cardiac medications before your next appointment, please call your pharmacy.

## 2023-09-29 DIAGNOSIS — N816 Rectocele: Secondary | ICD-10-CM | POA: Diagnosis not present

## 2023-09-29 DIAGNOSIS — N8111 Cystocele, midline: Secondary | ICD-10-CM | POA: Diagnosis not present

## 2023-10-13 ENCOUNTER — Other Ambulatory Visit (HOSPITAL_BASED_OUTPATIENT_CLINIC_OR_DEPARTMENT_OTHER): Payer: Self-pay | Admitting: Cardiovascular Disease

## 2023-10-18 ENCOUNTER — Ambulatory Visit: Admitting: Obstetrics and Gynecology

## 2023-11-12 ENCOUNTER — Ambulatory Visit: Admitting: Family Medicine

## 2023-11-12 ENCOUNTER — Encounter: Payer: Self-pay | Admitting: Family Medicine

## 2023-11-12 VITALS — BP 194/92 | HR 40 | Temp 98.0°F | Resp 16 | Ht 63.0 in | Wt 114.4 lb

## 2023-11-12 DIAGNOSIS — I480 Paroxysmal atrial fibrillation: Secondary | ICD-10-CM

## 2023-11-12 DIAGNOSIS — R051 Acute cough: Secondary | ICD-10-CM | POA: Diagnosis not present

## 2023-11-12 LAB — POCT INFLUENZA A/B
Influenza A, POC: NEGATIVE
Influenza B, POC: NEGATIVE

## 2023-11-12 LAB — POC COVID19 BINAXNOW: SARS Coronavirus 2 Ag: NEGATIVE

## 2023-11-12 MED ORDER — CEFDINIR 300 MG PO CAPS: 300.0000 mg | ORAL_CAPSULE | Freq: Two times a day (BID) | ORAL | 0 refills | Status: DC

## 2023-11-12 NOTE — Progress Notes (Signed)
 Subjective:     Patient ID: [name], female    DOB: [birth year], 89 y.o.   MRN: [id number]  Chief Complaint  Patient presents with   Sore Throat    Sx started Sunday   Cough   Nasal Congestion   Emesis    Started today    HPI Discussed the use of AI scribe software for clinical note transcription with the patient, who gave verbal consent to proceed.  History of Present Illness [name] is a 89 year old female with atrial fibrillation who presents with respiratory symptoms and gastrointestinal upset. She is accompanied by her son, [name], who assists in her care.  She has been experiencing respiratory symptoms since Sunday, beginning with a sore throat that progressed to a cough. The cough originates deep in her chest and produces yellowish sputum, although it has mostly cleared. Currently, she has not coughed up anything but feels the congestion is deep in her chest. She has slight shortness of breath. Woke up with several sweats last pm.    Gastrointestinal symptoms include an upset stomach and loose, soft bowel movements. She vomited in the lobby, describing it as 'white slick foam,' and feels nauseous, especially during the car ride to the clinic. Her daughter suggested she might be dehydrated, which she acknowledges as she has not been drinking enough fluids. She notes her tongue was 'white as snow,' which her daughter attributed to possible dehydration.  She has a history of atrial fibrillation and experienced symptoms a couple of days ago. She lives with her son, [name], who helps care for her.    Health Maintenance Due  Topic Date Due   Medicare Annual Wellness (AWV)  02/21/2020    Past Medical History:  Diagnosis Date   Abdominal pain 06/16/2023   Arthritis    Cerebrovascular accident (HCC) 06/16/2023   Chicken pox    Cyst of ovary 06/16/2023   Dysuria 06/16/2023   Glaucoma of both eyes    High cholesterol    Hypertension    a. Normal renal arteries by  duplex 2013.   Hypothyroidism    Migraine    Other female genital prolapse 06/16/2023   PAD (peripheral artery disease) (HCC)    a. 04/2013: s/p PCI to occluded right popliteal artery    PAF (paroxysmal atrial fibrillation) (HCC)    a. s/p MAZE 2006. b. Event monitor 2013: NSR with PACs.   Pelvic mass 06/16/2023   Sinus bradycardia    a. 03/2011 during hospital admission - beta blocker stopped.   Stroke [facility])    a. Pt reports 3-4 prior strokes without residual sx. b. CVA 2013 (presented with headache) - started on Plavix . Event monitor as outpt - no AF.   Vulvovaginitis 06/16/2023    Past Surgical History:  Procedure Laterality Date   CATARACT EXTRACTION Left 11/09/2017   LOWER EXTREMITY ANGIOGRAM N/A 05/15/2013   Procedure: LOWER EXTREMITY ANGIOGRAM;  Surgeon: Dorn JINNY Lesches, MD;  Location: [facility] CATH LAB;  Service: Cardiovascular;  Laterality: N/A;   MAZE  ~ 2006   POPLITEAL ARTERY STENT  05/15/2013     Current Outpatient Medications:    apixaban  (ELIQUIS ) 2.5 MG TABS tablet, TAKE 1 TABLET(2.5 MG) BY MOUTH TWICE DAILY, Disp: 180 tablet, Rfl: 3   cefdinir  (OMNICEF ) 300 MG capsule, Take 1 capsule (300 mg total) by mouth 2 (two) times daily., Disp: 14 capsule, Rfl: 0   chlorthalidone  (HYGROTON ) 25 MG tablet, Take 1 tablet (25 mg total) by mouth  daily., Disp: 90 tablet, Rfl: 3   dorzolamide-timolol (COSOPT) 22.3-6.8 MG/ML ophthalmic solution, Place 1 drop into the left eye in the morning and at bedtime. , Disp: , Rfl:    doxazosin  (CARDURA ) 2 MG tablet, Take 1.5 tablets (3 mg total) by mouth at bedtime., Disp: 135 tablet, Rfl: 3   hydrALAZINE  (APRESOLINE ) 25 MG tablet, TAKE 1 AND 1/2 TABLETS(37.5 MG) BY MOUTH THREE TIMES DAILY, Disp: 150 tablet, Rfl: 3   loperamide  (IMODIUM  A-D) 2 MG tablet, Take 1 tablet (2 mg total) by mouth 4 (four) times daily as needed for diarrhea or loose stools., Disp: 20 tablet, Rfl: 0   metoprolol  tartrate (LOPRESSOR ) 25 MG tablet, Take 0.5 tablets (12.5 mg  total) by mouth daily as needed (HR >110 take only BID)., Disp: 45 tablet, Rfl: 0   Multiple Vitamin (MULTIVITAMIN) LIQD, Take 30 mLs by mouth daily., Disp: , Rfl:    Papaya TABS, Take 3 tablets by mouth 3 (three) times daily. , Disp: , Rfl:    Probiotic CAPS, Take 1 capsule by mouth daily., Disp: , Rfl:    thyroid  (ARMOUR THYROID ) 15 MG tablet, TAKE 1 TABLET BY MOUTH EVERY DAY, Disp: 87 tablet, Rfl: 3  Allergies  Allergen Reactions   Aceon [Perindopril Erbumine] Cough   Amlodipine  Other (See Comments)    Lower extremity discomfort   Aspirin Cough and Other (See Comments)    STOPPED BY A MEDICAL PROFESSIONAL   Azor [Amlodipine -Olmesartan ] Cough   Beta Adrenergic Blockers Other (See Comments)    Bradycardia   Hydralazine  Nausea And Vomiting and Other (See Comments)    Weakness- tolerating in 2021    Milk-Related Compounds Other (See Comments) and Cough    STOPPED BY A MEDICAL PROFESSIONAL   Other Other (See Comments) and Cough    NUTS- ALL TYPES- (STOPPED BY A MEDICAL PROFESSIONAL) NO FOODS THAT CAUSE GAS (will cause A-Fib)   Peanut-Containing Drug Products Other (See Comments) and Cough    STOPPED BY A MEDICAL PROFESSIONAL   Sulfamethoxazole-Trimethoprim Other (See Comments)    Patient cannot specifically recall the reaction   Vitamin D  Analogs Cough   ROS neg/noncontributory except as noted HPI/below      Objective:     BP (!) 194/92   Pulse (!) 40   Temp 98 F (36.7 C) (Temporal)   Resp 16   Ht 5' 3 (1.6 m)   Wt 114 lb 6 oz (51.9 kg)   SpO2 98%   BMI 20.26 kg/m  Wt Readings from Last 3 Encounters:  11/12/23 114 lb 6 oz (51.9 kg)  09/14/23 117 lb (53.1 kg)  08/03/23 121 lb 3.2 oz (55 kg)    Physical Exam   Gen: WDWN NAD HEENT: NCAT, conjunctiva not injected, sclera nonicteric NECK:  supple, no thyromegaly,few submand nodes.   TM WNL B, OP dry, no exudates  CARDIAC: brady RRR but occ extra beat, S1S2+ LUNGS: CTAB. No wheezes ABDOMEN:  BS+, soft, NTND, No  HSM, no masses EXT:  no edema MSK: walker NEURO: A&O x3.  CN II-XII intact.  PSYCH: normal mood. Good eye contact  Results for orders placed or performed in visit on 11/12/23  POC COVID-19   Collection Time: 11/12/23  3:54 PM  Result Value Ref Range   SARS Coronavirus 2 Ag Negative Negative  POCT Influenza A/B   Collection Time: 11/12/23  3:54 PM  Result Value Ref Range   Influenza A, POC Negative Negative   Influenza B, POC Negative Negative  Assessment & Plan:  Acute cough -     POC COVID-19 BinaxNow -     POCT Influenza A/B  Paroxysmal atrial fibrillation (HCC)  Other orders -     Cefdinir ; Take 1 capsule (300 mg total) by mouth 2 (two) times daily.  Dispense: 14 capsule; Refill: 0  Assessment and Plan Assessment & Plan Acute bronchitis with cough and upper respiratory symptoms   Symptoms began with a sore throat and progressed to a cough with yellowish sputum. Night sweats occurred without fever. COVID-19 and flu tests were negative, indicating a likely viral cause such as RSV or other. Prescribe Omnicef  (cefdinir ) 300 mg capsule twice daily, considering a liquid formulation if swallowing is difficult. Advise using over-the-counter cough suppressants like Delsym  or Robitussin as needed. Instruct to seek emergency care if symptoms worsen, such as increased shortness of breath or inability to keep fluids down.  Nausea and vomiting with loose stools   Nausea and vomiting are likely due to gastrointestinal upset from mucus accumulation. Loose stools are present but not diarrhea, possibly worsened by dehydration. Encourage hydration with water and electrolyte solutions, avoiding coconut water due to its laxative effect. Advise against using Tessalon  Perles due to a past allergic reaction. Declined anti-nausea meds as only while on ride here  Dehydration, at risk   She is at risk for dehydration due to inadequate fluid intake and gastrointestinal symptoms, though no severe  dehydration signs are present. Encourage frequent sips of water or flavored electrolyte solutions throughout the day, avoiding coconut water.  Symptomatic atrial fibrillation   Intermittent symptoms of atrial fibrillation are noted. Monitor for worsening symptoms, such as increased palpitations or shortness of breath.    Return if symptoms worsen or fail to improve.  Jenkins CHRISTELLA Carrel, MD

## 2023-11-12 NOTE — Patient Instructions (Addendum)
 It was very nice to see you today!  Drink fluids.    ER if worsening Antibiotics sent to pharmacy   PLEASE NOTE:  If you had any lab tests please let us  know if you have not heard back within a few days. You may see your results on MyChart before we have a chance to review them but we will give you a call once they are reviewed by us . If we ordered any referrals today, please let us  know if you have not heard from their office within the next week.   Please try these tips to maintain a healthy lifestyle:  Eat most of your calories during the day when you are active. Eliminate processed foods including packaged sweets (pies, cakes, cookies), reduce intake of potatoes, white bread, white pasta, and white rice. Look for whole grain options, oat flour or almond flour.  Each meal should contain half fruits/vegetables, one quarter protein, and one quarter carbs (no bigger than a computer mouse).  Cut down on sweet beverages. This includes juice, soda, and sweet tea. Also watch fruit intake, though this is a healthier sweet option, it still contains natural sugar! Limit to 3 servings daily.  Drink at least 1 glass of water with each meal and aim for at least 8 glasses per day  Exercise at least 150 minutes every week.

## 2023-11-17 ENCOUNTER — Other Ambulatory Visit: Payer: Self-pay | Admitting: Family Medicine

## 2023-11-25 ENCOUNTER — Ambulatory Visit: Admitting: Family

## 2023-11-25 ENCOUNTER — Encounter: Payer: Self-pay | Admitting: Family

## 2023-11-25 ENCOUNTER — Ambulatory Visit: Payer: Self-pay

## 2023-11-25 VITALS — BP 130/58 | HR 59 | Temp 97.0°F | Ht 63.0 in | Wt 117.4 lb

## 2023-11-25 DIAGNOSIS — J069 Acute upper respiratory infection, unspecified: Secondary | ICD-10-CM | POA: Diagnosis not present

## 2023-11-25 NOTE — Telephone Encounter (Signed)
 FYI Only or Action Required?: FYI only for provider.  Patient was last seen in primary care on 11/12/2023 by Wendolyn Jenkins Jansky, MD.  Called Nurse Triage reporting Cough.  Symptoms began several weeks ago.  Interventions attempted: Rest, hydration, or home remedies.  Symptoms are: unchanged.  Triage Disposition: See Physician Within 24 Hours  Patient/caregiver understands and will follow disposition?: Yes        Copied from CRM #[id number]. Topic: Clinical - Red Word Triage >> Nov 25, 2023  8:35 AM [name] wrote: [facility] that prompted transfer to Nurse Triage: Patient states she has avery deep cough,feels very low in her chest and is coughing up thick phlegm Reason for Disposition  SEVERE coughing spells (e.g., whooping sound after coughing, vomiting after coughing)  Answer Assessment - Initial Assessment Questions 1. ONSET: When did the cough begin?      X 3 weeks Recent OV and was given abx - did not finish abx d/t upset stomach, fatigue 2. SEVERITY: How bad is the cough today?      Becoming more persistent 3. SPUTUM: Describe the color of your sputum (e.g., none, dry cough; clear, white, yellow, green)     clear 4. HEMOPTYSIS: Are you coughing up any blood? If Yes, ask: How much? (e.g., flecks, streaks, tablespoons, etc.)     denies 5. DIFFICULTY BREATHING: Are you having difficulty breathing? If Yes, ask: How bad is it? (e.g., mild, moderate, severe)      denies 6. FEVER: Do you have a fever? If Yes, ask: What is your temperature, how was it measured, and when did it start?     denies 7. CARDIAC HISTORY: Do you have any history of heart disease? (e.g., heart attack, congestive heart failure)      *No Answer* 8. LUNG HISTORY: Do you have any history of lung disease?  (e.g., pulmonary embolus, asthma, emphysema)     *No Answer* 9. PE RISK FACTORS: Do you have a history of blood clots? (or: recent major surgery, recent prolonged travel,  bedridden)     *No Answer* 10. OTHER SYMPTOMS: Do you have any other symptoms? (e.g., runny nose, wheezing, chest pain)       denies 11. PREGNANCY: Is there any chance you are pregnant? When was your last menstrual period?       N/a 12. TRAVEL: Have you traveled out of the country in the last month? (e.g., travel history, exposures)       N/a  Protocols used: Cough - Acute Productive-A-AH

## 2023-11-25 NOTE — Telephone Encounter (Signed)
 Patient scheduled to see Corean today. Dr. Katrinka will review triage note.

## 2023-11-25 NOTE — Progress Notes (Signed)
 Patient ID: [name], female    DOB: 09/17/1925, 89 y.o.   MRN: [id number]  Chief Complaint  Patient presents with   Cough    Pt c/o cough with clear mucus, present for 2 weeks. Negative in office for covid and flu on 9/19. Has tried cefdinir .   Discussed the use of AI scribe software for clinical note transcription with the patient, who gave verbal consent to proceed.  History of Present Illness   [name] is a 89 year old female who presents with her son for a persistent cough and nasal symptoms.  She initially experienced a cold that progressed to a persistent cough lasting about two weeks. She was seen by another provider in the office last week. She was given Cefdinir . The cough improved slightly but then worsened, leading to frequent coughing and gagging. Nasal drainage, sneezing, and watery eyes occur when coughing. She denies shortness of breath or chest tightness but has chest soreness from coughing.  She discontinued cefdinir  after a day and a half due to vomiting. Reports mild clear nasal drainage, no sore throat or fever. She has not taken any antihistamines or allergy medications recently but has used Nasacort  nasal spray in the past. She is currently using [facility] cough syrup inconsistently and has not tried Mucinex  recently. She has a history of adverse reactions to Bactrim and now cefdinir . She has not taken prednisone before and is hesitant to try it.  She has not received a flu shot this year due to a previous severe reaction that resulted in hospitalization. Her husband passed away during the same illness, influencing her decision to avoid flu vaccinations.  Her current medications include metoprolol  and chlorthalidone , which she takes in the mornings. She monitors her blood pressure, which can vary, and she feels unwell when it is low.     Assessment and Plan    Acute upper respiratory infection with persistent cough Persistent cough with clear mucus,  nasal drainage, and sneezing suggests viral infection. No fever or colored mucus, reducing likelihood of bacterial infection. Lungs clear. Reluctant to take steroids. Do not believe another antibiotic needed at this time.  - Recommend OTC Mucinex  for mucus clearance. - Continue [facility] cough syrup as needed. - Encourage increased fluid intake. - Advise against eating before bed to prevent reflux. - Contact if fever develops or mucus changes color. - Consider azithromycin if no improvement by Monday.  Adverse reaction to cefdinir  Experienced gastrointestinal effects after taking cefdinir , leading to discontinuation. - Add cefdinir  to allergy list.     Subjective:    Outpatient Medications Prior to Visit  Medication Sig Dispense Refill   apixaban  (ELIQUIS ) 2.5 MG TABS tablet TAKE 1 TABLET(2.5 MG) BY MOUTH TWICE DAILY 180 tablet 3   chlorthalidone  (HYGROTON ) 25 MG tablet TAKE 1 TABLET(25 MG) BY MOUTH DAILY 90 tablet 3   dorzolamide-timolol (COSOPT) 22.3-6.8 MG/ML ophthalmic solution Place 1 drop into the left eye in the morning and at bedtime.      doxazosin  (CARDURA ) 2 MG tablet Take 1.5 tablets (3 mg total) by mouth at bedtime. 135 tablet 3   hydrALAZINE  (APRESOLINE ) 25 MG tablet TAKE 1 AND 1/2 TABLETS(37.5 MG) BY MOUTH THREE TIMES DAILY 150 tablet 3   loperamide  (IMODIUM  A-D) 2 MG tablet Take 1 tablet (2 mg total) by mouth 4 (four) times daily as needed for diarrhea or loose stools. 20 tablet 0   metoprolol  tartrate (LOPRESSOR ) 25 MG tablet Take 0.5 tablets (12.5 mg  total) by mouth daily as needed (HR >110 take only BID). 45 tablet 0   Multiple Vitamin (MULTIVITAMIN) LIQD Take 30 mLs by mouth daily.     Papaya TABS Take 3 tablets by mouth 3 (three) times daily.      Probiotic CAPS Take 1 capsule by mouth daily.     thyroid  (ARMOUR THYROID ) 15 MG tablet TAKE 1 TABLET BY MOUTH EVERY DAY 87 tablet 3   cefdinir  (OMNICEF ) 300 MG capsule Take 1 capsule (300 mg total) by mouth 2 (two)  times daily. 14 capsule 0   No facility-administered medications prior to visit.   Past Medical History:  Diagnosis Date   Abdominal pain 06/16/2023   Arthritis    Cerebrovascular accident (HCC) 06/16/2023   Chicken pox    Cyst of ovary 06/16/2023   Dysuria 06/16/2023   Glaucoma of both eyes    High cholesterol    Hypertension    a. Normal renal arteries by duplex 2013.   Hypothyroidism    Migraine    Other female genital prolapse 06/16/2023   PAD (peripheral artery disease)    a. 04/2013: s/p PCI to occluded right popliteal artery    PAF (paroxysmal atrial fibrillation) (HCC)    a. s/p MAZE 2006. b. Event monitor 2013: NSR with PACs.   Pelvic mass 06/16/2023   Sinus bradycardia    a. 03/2011 during hospital admission - beta blocker stopped.   Stroke [facility])    a. Pt reports 3-4 prior strokes without residual sx. b. CVA 2013 (presented with headache) - started on Plavix . Event monitor as outpt - no AF.   Vulvovaginitis 06/16/2023   Past Surgical History:  Procedure Laterality Date   CATARACT EXTRACTION Left 11/09/2017   LOWER EXTREMITY ANGIOGRAM N/A 05/15/2013   Procedure: LOWER EXTREMITY ANGIOGRAM;  Surgeon: Dorn JINNY Lesches, MD;  Location: [facility] CATH LAB;  Service: Cardiovascular;  Laterality: N/A;   MAZE  ~ 2006   POPLITEAL ARTERY STENT  05/15/2013   Allergies  Allergen Reactions   Aceon [Perindopril Erbumine] Cough   Amlodipine  Other (See Comments)    Lower extremity discomfort   Aspirin Cough and Other (See Comments)    STOPPED BY A MEDICAL PROFESSIONAL   Azor [Amlodipine -Olmesartan ] Cough   Beta Adrenergic Blockers Other (See Comments)    Bradycardia   Hydralazine  Nausea And Vomiting and Other (See Comments)    Weakness- tolerating in 2021    Milk-Related Compounds Other (See Comments) and Cough    STOPPED BY A MEDICAL PROFESSIONAL   Other Other (See Comments) and Cough    NUTS- ALL TYPES- (STOPPED BY A MEDICAL PROFESSIONAL) NO FOODS THAT CAUSE GAS (will cause  A-Fib)   Peanut-Containing Drug Products Other (See Comments) and Cough    STOPPED BY A MEDICAL PROFESSIONAL   Sulfamethoxazole-Trimethoprim Other (See Comments)    Patient cannot specifically recall the reaction   Vitamin D  Analogs Cough      Objective:    Physical Exam Vitals and nursing note reviewed.  Constitutional:      Appearance: Normal appearance. She is ill-appearing.     Interventions: Face mask in place.  HENT:     Right Ear: Tympanic membrane and ear canal normal.     Left Ear: Tympanic membrane and ear canal normal.     Nose:     Right Sinus: No frontal sinus tenderness.     Left Sinus: No frontal sinus tenderness.     Mouth/Throat:     Mouth: Mucous membranes are  moist.     Pharynx: Posterior oropharyngeal erythema present. No pharyngeal swelling, oropharyngeal exudate or uvula swelling.     Tonsils: No tonsillar exudate or tonsillar abscesses.  Cardiovascular:     Rate and Rhythm: Normal rate and regular rhythm.  Pulmonary:     Effort: Pulmonary effort is normal.     Breath sounds: Normal breath sounds.  Musculoskeletal:        General: Normal range of motion.  Lymphadenopathy:     Head:     Right side of head: No preauricular or posterior auricular adenopathy.     Left side of head: No preauricular or posterior auricular adenopathy.     Cervical: No cervical adenopathy.  Skin:    General: Skin is warm and dry.  Neurological:     Mental Status: She is alert.  Psychiatric:        Mood and Affect: Mood normal.        Behavior: Behavior normal.    BP (!) 130/58 (BP Location: Left Arm, Patient Position: Sitting, Cuff Size: Small)   Pulse (!) 59   Temp (!) 97 F (36.1 C) (Temporal)   Ht 5' 3 (1.6 m)   Wt 117 lb 6 oz (53.2 kg)   SpO2 99%   BMI 20.79 kg/m  Wt Readings from Last 3 Encounters:  11/25/23 117 lb 6 oz (53.2 kg)  11/12/23 114 lb 6 oz (51.9 kg)  09/14/23 117 lb (53.1 kg)      Lucius Krabbe, NP

## 2023-11-30 ENCOUNTER — Emergency Department (HOSPITAL_COMMUNITY)

## 2023-11-30 ENCOUNTER — Emergency Department (HOSPITAL_COMMUNITY)
Admission: EM | Admit: 2023-11-30 | Discharge: 2023-11-30 | Disposition: A | Discharge status: Home / Self Care | Attending: Emergency Medicine | Admitting: Emergency Medicine

## 2023-11-30 ENCOUNTER — Other Ambulatory Visit: Payer: Self-pay

## 2023-11-30 DIAGNOSIS — R1110 Vomiting, unspecified: Secondary | ICD-10-CM | POA: Diagnosis not present

## 2023-11-30 DIAGNOSIS — N184 Chronic kidney disease, stage 4 (severe): Secondary | ICD-10-CM | POA: Diagnosis not present

## 2023-11-30 DIAGNOSIS — I129 Hypertensive chronic kidney disease with stage 1 through stage 4 chronic kidney disease, or unspecified chronic kidney disease: Secondary | ICD-10-CM | POA: Insufficient documentation

## 2023-11-30 DIAGNOSIS — Z7901 Long term (current) use of anticoagulants: Secondary | ICD-10-CM | POA: Diagnosis not present

## 2023-11-30 DIAGNOSIS — E039 Hypothyroidism, unspecified: Secondary | ICD-10-CM | POA: Insufficient documentation

## 2023-11-30 DIAGNOSIS — Z91010 Allergy to peanuts: Secondary | ICD-10-CM | POA: Insufficient documentation

## 2023-11-30 DIAGNOSIS — R0989 Other specified symptoms and signs involving the circulatory and respiratory systems: Secondary | ICD-10-CM | POA: Diagnosis not present

## 2023-11-30 DIAGNOSIS — R002 Palpitations: Secondary | ICD-10-CM | POA: Diagnosis not present

## 2023-11-30 DIAGNOSIS — I499 Cardiac arrhythmia, unspecified: Secondary | ICD-10-CM | POA: Diagnosis not present

## 2023-11-30 DIAGNOSIS — R079 Chest pain, unspecified: Secondary | ICD-10-CM | POA: Diagnosis not present

## 2023-11-30 LAB — URINALYSIS, W/ REFLEX TO CULTURE (INFECTION SUSPECTED)
Bacteria, UA: NONE SEEN
Bilirubin Urine: NEGATIVE
Glucose, UA: NEGATIVE mg/dL
Hgb urine dipstick: NEGATIVE
Ketones, ur: NEGATIVE mg/dL
Nitrite: NEGATIVE
Protein, ur: NEGATIVE mg/dL
Specific Gravity, Urine: 1.009 (ref 1.005–1.030)
pH: 7 (ref 5.0–8.0)

## 2023-11-30 LAB — CBC
HCT: 31.4 % — ABNORMAL LOW (ref 36.0–46.0)
Hemoglobin: 10 g/dL — ABNORMAL LOW (ref 12.0–15.0)
MCH: 26.5 pg (ref 26.0–34.0)
MCHC: 31.8 g/dL (ref 30.0–36.0)
MCV: 83.3 fL (ref 80.0–100.0)
Platelets: 173 K/uL (ref 150–400)
RBC: 3.77 MIL/uL — ABNORMAL LOW (ref 3.87–5.11)
RDW: 14.1 % (ref 11.5–15.5)
WBC: 2.6 K/uL — ABNORMAL LOW (ref 4.0–10.5)
nRBC: 0 % (ref 0.0–0.2)

## 2023-11-30 LAB — BASIC METABOLIC PANEL WITH GFR
Anion gap: 12 (ref 5–15)
BUN: 30 mg/dL — ABNORMAL HIGH (ref 8–23)
CO2: 22 mmol/L (ref 22–32)
Calcium: 8.8 mg/dL — ABNORMAL LOW (ref 8.9–10.3)
Chloride: 104 mmol/L (ref 98–111)
Creatinine, Ser: 1.55 mg/dL — ABNORMAL HIGH (ref 0.44–1.00)
GFR, Estimated: 31 mL/min — ABNORMAL LOW (ref >60)
Glucose, Bld: 102 mg/dL — ABNORMAL HIGH (ref 70–99)
Potassium: 3.6 mmol/L (ref 3.5–5.1)
Sodium: 138 mmol/L (ref 135–145)

## 2023-11-30 LAB — CK: Total CK: 63 U/L (ref 38–234)

## 2023-11-30 LAB — TROPONIN I (HIGH SENSITIVITY)
Troponin I (High Sensitivity): 36 ng/L — ABNORMAL HIGH (ref <18)
Troponin I (High Sensitivity): 39 ng/L — ABNORMAL HIGH (ref <18)

## 2023-11-30 LAB — TSH: TSH: 4.064 u[IU]/mL (ref 0.350–4.500)

## 2023-11-30 LAB — I-STAT CG4 LACTIC ACID, ED: Lactic Acid, Venous: 0.7 mmol/L (ref 0.5–1.9)

## 2023-11-30 NOTE — ED Provider Notes (Signed)
 [facility] Provider Note   CSN: [id number] Arrival date & time: 11/30/23  1028     History Chief Complaint  Patient presents with   Palpitations    HPI: LIBIA FAZZINI is a 89 y.o. female with history pertinent for atrial fibrillation, prior stroke, HTN, PAD, hypothyroidism, hypercholesterolemia, GERD, CKD 4, anemia who presents complaining of palpitations and generalized weakness. Patient arrived via EMS from home.  History provided by patient.  No interpreter required during this encounter.  Patient reports that she has had generalized weakness and cough for several weeks.  Reports that she was seen by her PCP, and was negative for COVID and flu, and she was started on antibiotics thereafter (cefdinir  for bronchitis).  Reports that she felt poorly after the cefdinir , therefore stopped it after 24 hours, and has continued to have progressive productive cough.  Reports that she has also had malaise and generalized weakness.  Reports that she felt that her heart rate elevated and she was in A-fib this morning, therefore she took her home metoprolol , however she did not have any change, therefore she came to the emergency department.  Per paramedic at bedside during history, when he started the patient's IV in the ED, she had changed from tachycardia to bradycardia, patient feels that she is no longer in atrial fibrillation, however does feel persistent generalized weakness.  Patient's recorded medical, surgical, social, medication list and allergies were reviewed in the Snapshot window as part of the initial history.   Prior to Admission medications   Medication Sig Start Date End Date Taking? Authorizing Provider  apixaban  (ELIQUIS ) 2.5 MG TABS tablet TAKE 1 TABLET(2.5 MG) BY MOUTH TWICE DAILY 03/10/23  Yes Katrinka Garnette KIDD, MD  chlorthalidone  (HYGROTON ) 25 MG tablet TAKE 1 TABLET(25 MG) BY MOUTH DAILY 11/17/23  Yes Katrinka Garnette KIDD, MD   dorzolamide-timolol (COSOPT) 22.3-6.8 MG/ML ophthalmic solution Place 1 drop into both eyes in the morning and at bedtime.   Yes [provider]  doxazosin  (CARDURA ) 2 MG tablet Take 1.5 tablets (3 mg total) by mouth at bedtime. 07/01/23  Yes Vannie Reche RAMAN, NP  fluticasone  (FLONASE ) 50 MCG/ACT nasal spray Place 1 spray into both nostrils in the morning and at bedtime.   Yes [provider]  hydrALAZINE  (APRESOLINE ) 25 MG tablet TAKE 1 AND 1/2 TABLETS(37.5 MG) BY MOUTH THREE TIMES DAILY Patient taking differently: Take 25 mg by mouth 2 (two) times daily. 07/16/23  Yes Katrinka Garnette KIDD, MD  metoprolol  tartrate (LOPRESSOR ) 25 MG tablet Take 0.5 tablets (12.5 mg total) by mouth daily as needed (HR >110 take only BID). 05/05/23  Yes Verlin Lonni BIRCH, MD  Multiple Vitamin (MULTIVITAMIN) LIQD Take 30 mLs by mouth daily.   Yes [provider]  Papaya TABS Take 3 tablets by mouth 3 (three) times daily before meals.   Yes [provider]  Probiotic CAPS Take 1 capsule by mouth daily.   Yes [provider]  thyroid  (ARMOUR THYROID ) 15 MG tablet TAKE 1 TABLET BY MOUTH EVERY DAY 08/20/23  Yes Katrinka Garnette KIDD, MD     Allergies: Cefdinir , Aceon [perindopril erbumine], Amlodipine , Aspirin, Azor [amlodipine -olmesartan ], Beta adrenergic blockers, Hydralazine , Milk-related compounds, Other, Peanut-containing drug products, Sulfamethoxazole-trimethoprim, and Vitamin d  analogs   Review of Systems   ROS as per HPI  Physical Exam Updated Vital Signs BP (!) 166/66   Pulse (!) 40   Temp 98.8 F (37.1 C) (Oral)   Resp 13  Ht 5' 3 (1.6 m)   Wt 53.1 kg   SpO2 99%   BMI 20.73 kg/m  Physical Exam Vitals and nursing note reviewed.  Constitutional:      General: She is not in acute distress.    Appearance: She is well-developed.  HENT:     Head: Normocephalic and atraumatic.  Eyes:     Conjunctiva/sclera: Conjunctivae normal.  Cardiovascular:     Rate  and Rhythm: Regular rhythm. Bradycardia present.     Heart sounds: No murmur heard. Pulmonary:     Effort: Pulmonary effort is normal. No respiratory distress.     Breath sounds: Normal breath sounds.  Abdominal:     Palpations: Abdomen is soft.     Tenderness: There is no abdominal tenderness.  Musculoskeletal:        General: No swelling.     Cervical back: Neck supple.  Skin:    General: Skin is warm and dry.     Capillary Refill: Capillary refill takes less than 2 seconds.  Neurological:     Mental Status: She is alert.  Psychiatric:        Mood and Affect: Mood normal.     ED Course/ Medical Decision Making/ A&P    Procedures Procedures   Medications Ordered in ED Medications - No data to display  Medical Decision Making:   ADYSSON REVELLE is a 89 y.o. female who presents for weakness, tachycardia as per above.  Physical exam is pertinent for bradycardia with regular rhythm.   The differential includes but is not limited to A-fib RVR, sepsis, pneumonia, demand ischemia, ACS, thyroid  dysfunction, UTI.  Independent historian: None  External data reviewed: Notes: Reviewed patient's recent outpatient notes, patient was negative for COVID and flu, was seen by PCP and started on cefdinir  for possible bronchitis  Labs: Ordered, Independent interpretation, and Details: UA without UTI.  CK WNL.  Initial troponin elevated at 36, delta 39, patient's baseline troponin appears to be 30-40.  Lactic acid WNL.  TSH WNL.  CBC with stable leukopenia and anemia on comparison to prior.  No thrombocytopenia.  BMP with stable creatinine and BUN elevations.  No emergent electrolyte derangement  Radiology: Ordered, Independent interpretation, Details: Chest x-ray without focal airspace opacification, cardiomediastinal silhouette gentian, pneumothorax, pleural effusion, bony derangement, and All images reviewed independently.  Agree with radiology report at this time.   DG Chest Portable 1  View Result Date: 11/30/2023 CLINICAL DATA:  Chest pain. EXAM: PORTABLE CHEST 1 VIEW COMPARISON:  06/16/2023. FINDINGS: Low lung volume. Bilateral lung fields are clear. Bilateral costophrenic angles are clear. Stable cardio-mediastinal silhouette. No acute osseous abnormalities. The soft tissues are within normal limits. IMPRESSION: No active disease. Electronically Signed   By: Ree Molt M.D.   On: 11/30/2023 11:16    EKG/Medicine tests: Ordered and Independent interpretation EKG Interpretation: Sinus rhythm Right bundle branch block Abnormal T, consider ischemia, diffuse leads Confirmed by Rogelia Satterfield ([id number]) on 11/30/2023 3:11:58 PM                Interventions: None  See the EMR for full details regarding lab and imaging results.  Patient presents for weakness and palpitations.  By the time of my evaluation patient had resolution of palpitations.  Patient has a history of A-fib RVR, patient was tachycardic during initial vitals, however had normalization of heart rate by the time of my evaluation and by the time of EKG, therefore cannot definitively determine patient's initial rhythm.  Patient in sinus  bradycardia which is her baseline rhythm on my evaluation.  Given patient reports persistent sensation of weakness, do feel that labs are indicated, as well as chest x-ray given patient reports recent pneumonia and did not complete course of antibiotics.  Chest x-ray without evidence of pneumonia.  Labs at baseline.  Patient does have mild elevation of troponin, however patient has had mild consistent elevation of troponin for the past 4 years, without chest pain.  On reevaluation patient states that she feels at baseline.  I did consider treating the patient for a course of atypical pneumonia with a macrolide antibiotic, however patient reports that the worsening cough that she saw her PCP for resolved spontaneously, and her cough is now back to her baseline.  Reports that she Grade feels  weak, and she feels at baseline.  Her son, [name], had also presented to bedside in the interim and feels that his mother is back to baseline.  Given patient's heart rate has normalized (Review of prior notes including outside clinic notes reveal that patient has baseline bradycardia in the 40s).  Labs are reassuring, cough and strength at baseline, feel that patient is stable for discharge and outpatient follow-up with PCP.  Patient and son at bedside agree, discharged in stable condition.    Presentation is most consistent with acute complicated illness, Current presentation is complicated by underlying chronic conditions, and I did consider and rule out acute life/limb-threatening illness  Discussion of management or test interpretations with external provider(s): Not indicated  Risk Drugs:Prescription drug management  Disposition: DISCHARGE: I believe that the patient is safe for discharge home with outpatient follow-up. Patient was informed of all pertinent physical exam, laboratory, and imaging findings including chronic mild elevation of troponin.  Patient's suspected etiology of their symptom presentation was discussed with the patient and all questions were answered. We discussed following up with PCP. I provided thorough ED return precautions. The patient feels safe and comfortable with this plan.  MDM generated using voice dictation software and may contain dictation errors.  Please contact me for any clarification or with any questions.  Clinical Impression:  1. Palpitations      Discharge   Final Clinical Impression(s) / ED Diagnoses Final diagnoses:  Palpitations    Rx / DC Orders ED Discharge Orders     None        Rogelia Jerilynn RAMAN, MD 11/30/23 [id number]

## 2023-11-30 NOTE — ED Notes (Signed)
 Patient converted to sinus brady when I started IV on her. Repeat EKG obtained

## 2023-11-30 NOTE — ED Triage Notes (Signed)
 Pt BIB [facility] from home, lives with family.  Approx. 8:30 pt had a cough with sudden onset of Chest pressure with palp.  Hx of afib.   Normally takes a dose of Toprol  and converts at home but didn't work today.  NO pressure on arrival.  In afib RVR 110-130 on arrival.   Respiratory infection dx approx 2 weeks ago. Placed on ABX, had bad response to ABX so was DC'd after 24 hrs.  Still has productive cough.   Pt has hx of chronic cough r/t seasonal allergies.   130/80 RR 18, 99% CBG 99 ETC02 35

## 2023-11-30 NOTE — Discharge Instructions (Addendum)
 [name]  Thank you for allowing us  to take care of you today.  You came to the Emergency Department today because you recently had a cough and went to your primary care doctor and got started on antibiotics.  The antibiotics did not set well with you so you discontinue them, but your cough has now come back to your regular cough.  This morning you were in A-fib, you took your metoprolol  at home and came to the emergency department, your heart changed back to your normal slow rhythm on its own while you are in the emergency department, and all of your labs are reassuring.  You feel back to usual self both you and your son [name] feel comfortable with you going home.  Given your cough is back to its usual, your chest x-ray does not show a pneumonia, and your white blood cell count is not elevated we are not starting you on any other antibiotics   To-Do: 1. Please follow-up with your primary doctor within 1 to 2 weeks / as soon as possible.   Please return to the Emergency Department or call 911 if you experience have worsening of your symptoms, or do not get better, repeat fast heart rate, chest pain, shortness of breath, severe or significantly worsening pain, high fever, severe confusion, pass out or have any reason to think that you need emergency medical care.   We hope you feel better soon.   Mitzie Later, MD Department of Emergency Medicine [facility]

## 2023-12-03 ENCOUNTER — Ambulatory Visit: Admitting: Family Medicine

## 2023-12-09 ENCOUNTER — Encounter: Payer: Self-pay | Admitting: Family Medicine

## 2023-12-09 ENCOUNTER — Ambulatory Visit: Admitting: Family Medicine

## 2023-12-09 VITALS — BP 132/58 | HR 50 | Temp 97.5°F | Ht 63.0 in | Wt 115.6 lb

## 2023-12-09 DIAGNOSIS — E039 Hypothyroidism, unspecified: Secondary | ICD-10-CM

## 2023-12-09 DIAGNOSIS — I4891 Unspecified atrial fibrillation: Secondary | ICD-10-CM

## 2023-12-09 DIAGNOSIS — I10 Essential (primary) hypertension: Secondary | ICD-10-CM | POA: Diagnosis not present

## 2023-12-09 MED ORDER — THYROID 15 MG PO TABS: 15.0000 mg | ORAL_TABLET | Freq: Every day | ORAL | 3 refills | Status: DC

## 2023-12-09 MED ORDER — APIXABAN 2.5 MG PO TABS: 2.5000 mg | ORAL_TABLET | Freq: Two times a day (BID) | ORAL | 3 refills | Status: AC

## 2023-12-09 NOTE — Progress Notes (Signed)
 Phone [phone] In person visit   Subjective:   [name] is a 89 y.o. year old very pleasant female patient who presents for/with See problem oriented charting Chief Complaint  Patient presents with   Hospitalization Follow-up    Past Medical History-  Patient Active Problem List   Diagnosis Date Noted   Atrial fibrillation (HCC) 06/16/2023    Priority: High   COVID-19 10/12/2019    Priority: High   CKD (chronic kidney disease), stage IV (HCC) 06/15/2018    Priority: High   Chronic cough 08/12/2015    Priority: High   PAD (peripheral artery disease) (HCC) 05/10/2013    Priority: High   History of stroke     Priority: High   Hypertensive disorder 06/16/2023    Priority: Medium    Carotid atherosclerosis 05/01/2021    Priority: Medium    Low vitamin B12 level 12/09/2017    Priority: Medium    Hyperglycemia 06/25/2014    Priority: Medium    Hypothyroidism 06/07/2014    Priority: Medium    Hypercholesterolemia 06/07/2014    Priority: Medium    GERD (gastroesophageal reflux disease) 06/25/2014    Priority: Low   Vitamin D  deficiency 06/25/2014    Priority: Low   Intracervical pessary 06/07/2014    Priority: Low   Sinus bradycardia 05/19/2013    Priority: Low   PAC (premature atrial contraction) 05/20/2011    Priority: Low   Diarrhea 02/13/2021   Anemia of chronic disease 12/14/2019   Anemia 08/16/2017   Perennial allergic rhinitis 08/12/2015    Medications- reviewed and updated Current Outpatient Medications  Medication Sig Dispense Refill   chlorthalidone  (HYGROTON ) 25 MG tablet TAKE 1 TABLET(25 MG) BY MOUTH DAILY 90 tablet 3   dorzolamide-timolol (COSOPT) 22.3-6.8 MG/ML ophthalmic solution Place 1 drop into both eyes in the morning and at bedtime.     doxazosin  (CARDURA ) 2 MG tablet Take 1.5 tablets (3 mg total) by mouth at bedtime. 135 tablet 3   hydrALAZINE  (APRESOLINE ) 25 MG tablet TAKE 1 AND 1/2 TABLETS(37.5 MG) BY MOUTH THREE TIMES DAILY  (Patient taking differently: Take 25 mg by mouth 2 (two) times daily.) 150 tablet 3   metoprolol  tartrate (LOPRESSOR ) 25 MG tablet Take 0.5 tablets (12.5 mg total) by mouth daily as needed (HR >110 take only BID). 45 tablet 0   Multiple Vitamin (MULTIVITAMIN) LIQD Take 30 mLs by mouth daily.     Papaya TABS Take 3 tablets by mouth 3 (three) times daily before meals.     Probiotic CAPS Take 1 capsule by mouth daily.     apixaban  (ELIQUIS ) 2.5 MG TABS tablet Take 1 tablet (2.5 mg total) by mouth 2 (two) times daily. 180 tablet 3   fluticasone  (FLONASE ) 50 MCG/ACT nasal spray Place 1 spray into both nostrils in the morning and at bedtime. (Patient not taking: Reported on 12/09/2023)     thyroid  (ARMOUR THYROID ) 15 MG tablet Take 1 tablet (15 mg total) by mouth daily. 90 tablet 3   No current facility-administered medications for this visit.     Objective:  BP (!) 132/58   Pulse (!) 50   Temp (!) 97.5 F (36.4 C) (Temporal)   Ht 5' 3 (1.6 m)   Wt 115 lb 9.6 oz (52.4 kg)   SpO2 97%   BMI 20.48 kg/m  Gen: NAD, resting comfortably CV: Slightly bradycardic but regular no murmurs rubs or gallops Lungs: CTAB no crackles, wheeze, rhonchi Abdomen: soft/nontender/nondistended/normal bowel sounds. No rebound or  guarding.  Ext: trace edema Skin: warm, dry Neuro: grossly normal, moves all extremities     Assessment and Plan   # Emergency department follow-up-was seen on 11/30/2023 with palpitations.  Troponin trend was flat.  CK was not elevated.  TSH was normal.  Chest x-ray reassuring.  Lactic acid not elevated urinalysis largely reassuring other than trace leukocytes.  She reported generalized weakness and cough for several weeks-had previously been negative for flu and COVID in office 5 days prior and was treated with antibiotics cefdinir  for bronchitis (deep cough) by Corean Comment NP but did not tolerate the medicine well  (nausea and vomiting 2 weeks) and stopped after 24 hours and had  progressive cough.  Her initial heart rate had been elevated and there was concern she could be back in atrial fibrillation but converted at least to sinus rhythm by time of monitoring so cannot fully ascertain whether she was in A-fib.  Her palpitations had also resolved by time of emergency department physician evaluation -cough is starting to let up finally- feeling more like herself -she has not had any susequent pain but did have some pain on that day that prompted family to call 911. If recurrent issues though would want to get her back in with cardiology- last seen in july -down 6 lbs from our last visit in June over 4 months. Felt like lost before the nausea and vomiting though. 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 Heart rate too low with daily  Anticoagulated with Eliquis  2.5 mg twice daily A/P: appropriately anticoagulated and rate controlled- continue current medicine.  Appears to be in sinus rhythm but has metoprolol  available as needed for elevated heart rates  #hypertension #CKD lV - sees Dr. Marlee S: medication: chlorthalidone  25 mg, doxazosin  3 mg, hydralazine  25 mg 2 times a day- 1.5 in morning and 1.5 in the evening mainly- ideally would be 3x a day Home readings #s: blood pressure highly variable BP Readings from Last 3 Encounters:  12/09/23 (!) 132/58  11/30/23 (!) 166/66  11/25/23 (!) 130/58  A/P: blood pressure well controlled continue current medications . Would love for diastolic to be a little higher but she's had such variability in blood pressure I worry about removing medications as ewll. Continue to monitor for now  CKD IV listed but last GFR slightly better at 31- continue to  monitor   #hypothyroidism S: compliant On thyroid  medication- armour thyroid  15 mg Lab Results  Component Value Date   TSH 4.064 11/30/2023  A/P:thyroid  well controlled continue current medications    #anemia and leukopenia- anemia of chronic disease per oncology 12/16/19 with only Primary Care Provider (PCP) follow up   Recommended follow up: Return in about 4 months (around 04/10/2024) for physical or sooner if needed.Schedule b4 you leave. Future Appointments  Date Time Provider Department Center  04/11/2024  1:20 PM Katrinka Garnette KIDD, MD [facility]    Lab/Order associations:   ICD-10-CM   1. Atrial fibrillation, unspecified type (HCC)  I48.91     2. Hypothyroidism, unspecified type  E03.9     3. Primary hypertension  I10       Meds ordered this encounter  Medications  apixaban  (ELIQUIS ) 2.5 MG TABS tablet    Sig: Take 1 tablet (2.5 mg total) by mouth 2 (two) times daily.    Dispense:  180 tablet    Refill:  3   thyroid  (ARMOUR THYROID ) 15 MG tablet    Sig: Take 1 tablet (15 mg total) by mouth daily.    Dispense:  90 tablet    Refill:  3    Return precautions advised.  Garnette Lukes, MD

## 2023-12-09 NOTE — Patient Instructions (Addendum)
 Hold off on labs- they did extensive labs in Emergency Department that were reassuring  Continue current medications . Wish we could raise that bottom blood pressure # a little- think you might feel a little stronger  If you have new or worsening symptom(s) let us  know or seek care  Recommended follow up: Return in about 4 months (around 04/10/2024) for physical or sooner if needed.Schedule b4 you leave. Or up to 6 months

## 2024-01-24 ENCOUNTER — Other Ambulatory Visit: Payer: Self-pay

## 2024-01-24 ENCOUNTER — Telehealth: Payer: Self-pay | Admitting: Family Medicine

## 2024-01-24 MED ORDER — THYROID 15 MG PO TABS: 15.0000 mg | ORAL_TABLET | Freq: Every day | ORAL | 0 refills | Status: AC

## 2024-01-24 NOTE — Telephone Encounter (Signed)
 Refill sent to [facility] pharmacy with no refill.   Copied from CRM #[id number]. Topic: Clinical - Prescription Issue >> Jan 24, 2024  1:40 PM Lauren C wrote: Reason for CRM: Pt lost her thyroid  (ARMOUR THYROID ) 15 MG tablet 90 day supply, was about 3 weeks into taking it. She says she has looked everywhere for days, has missed the medication for 3 or 4 days in a row now. Requesting emergency refill of this to her [facility] on file. Please call pt at [phone]

## 2024-02-02 ENCOUNTER — Encounter: Payer: Self-pay | Admitting: Cardiovascular Disease

## 2024-02-02 ENCOUNTER — Encounter (HOSPITAL_BASED_OUTPATIENT_CLINIC_OR_DEPARTMENT_OTHER): Payer: Self-pay | Admitting: Cardiovascular Disease

## 2024-03-23 ENCOUNTER — Other Ambulatory Visit: Payer: Self-pay | Admitting: Family Medicine

## 2024-04-11 ENCOUNTER — Encounter: Admitting: Family Medicine

## 2024-06-28 ENCOUNTER — Ambulatory Visit: Admitting: Cardiovascular Disease
